# Patient Record
Sex: Male | Born: 1955 | Race: White | Hispanic: No | Smoking: Former smoker
Health system: Southern US, Community
[De-identification: ages and names within clinical notes are randomized; demographics above are authoritative.]

## PROBLEM LIST (undated history)

## (undated) DIAGNOSIS — C801 Malignant (primary) neoplasm, unspecified: Secondary | ICD-10-CM

## (undated) DIAGNOSIS — I10 Essential (primary) hypertension: Secondary | ICD-10-CM

## (undated) DIAGNOSIS — E1140 Type 2 diabetes mellitus with diabetic neuropathy, unspecified: Secondary | ICD-10-CM

## (undated) DIAGNOSIS — Z87442 Personal history of urinary calculi: Secondary | ICD-10-CM

## (undated) DIAGNOSIS — R0602 Shortness of breath: Secondary | ICD-10-CM

## (undated) DIAGNOSIS — I639 Cerebral infarction, unspecified: Secondary | ICD-10-CM

## (undated) DIAGNOSIS — K219 Gastro-esophageal reflux disease without esophagitis: Secondary | ICD-10-CM

## (undated) HISTORY — PX: APPENDECTOMY: SHX54

---

## 2001-10-15 ENCOUNTER — Inpatient Hospital Stay (HOSPITAL_COMMUNITY): Admission: EM | Admit: 2001-10-15 | Discharge: 2001-10-16 | Payer: Self-pay | Admitting: Psychiatry

## 2001-12-08 ENCOUNTER — Emergency Department (HOSPITAL_COMMUNITY): Admission: EM | Admit: 2001-12-08 | Discharge: 2001-12-08 | Payer: Self-pay | Admitting: Emergency Medicine

## 2001-12-08 ENCOUNTER — Encounter: Payer: Self-pay | Admitting: Emergency Medicine

## 2006-10-18 ENCOUNTER — Emergency Department (HOSPITAL_COMMUNITY): Admission: EM | Admit: 2006-10-18 | Discharge: 2006-10-18 | Payer: Self-pay | Admitting: Emergency Medicine

## 2008-02-21 DIAGNOSIS — I639 Cerebral infarction, unspecified: Secondary | ICD-10-CM

## 2008-02-21 HISTORY — DX: Cerebral infarction, unspecified: I63.9
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
## 2010-12-02 LAB — URINALYSIS, ROUTINE W REFLEX MICROSCOPIC
Bilirubin Urine: NEGATIVE
Glucose, UA: >1000 — AB
Hgb urine dipstick: NEGATIVE
Ketones, ur: NEGATIVE
Leukocytes, UA: NEGATIVE
Nitrite: NEGATIVE
Specific Gravity, Urine: 1.025
Urobilinogen, UA: 0.2
pH: 5.5

## 2010-12-02 LAB — BASIC METABOLIC PANEL
BUN: 11
CO2: 26
Calcium: 8.8
Chloride: 104
Creatinine, Ser: 0.88
GFR calc Af Amer: >60
GFR calc non Af Amer: >60
Glucose, Bld: 254 — ABNORMAL HIGH
Potassium: 4.1
Sodium: 137

## 2010-12-02 LAB — DIFFERENTIAL
Basophils Relative: 2 — ABNORMAL HIGH
Eosinophils Absolute: 0.5
Lymphs Abs: 2.5
Neutro Abs: 8.4 — ABNORMAL HIGH
Neutrophils Relative %: 66

## 2010-12-02 LAB — CBC
HCT: 47.8
Hemoglobin: 16.5
MCHC: 34.5
MCV: 90
Platelets: 278
RBC: 5.31
RDW: 13.9
WBC: 12.8 — ABNORMAL HIGH

## 2010-12-02 LAB — URINE MICROSCOPIC-ADD ON

## 2010-12-28 ENCOUNTER — Other Ambulatory Visit: Payer: Self-pay

## 2010-12-28 ENCOUNTER — Inpatient Hospital Stay (HOSPITAL_COMMUNITY)
Admission: EM | Admit: 2010-12-28 | Discharge: 2010-12-31 | DRG: 418 | Disposition: A | Discharge status: Home / Self Care | Payer: Self-pay | Attending: Internal Medicine | Admitting: Internal Medicine

## 2010-12-28 ENCOUNTER — Emergency Department (HOSPITAL_COMMUNITY): Payer: Self-pay

## 2010-12-28 ENCOUNTER — Inpatient Hospital Stay (HOSPITAL_COMMUNITY): Payer: Self-pay

## 2010-12-28 DIAGNOSIS — Z91199 Patient's noncompliance with other medical treatment and regimen due to unspecified reason: Secondary | ICD-10-CM

## 2010-12-28 DIAGNOSIS — R739 Hyperglycemia, unspecified: Secondary | ICD-10-CM

## 2010-12-28 DIAGNOSIS — K810 Acute cholecystitis: Secondary | ICD-10-CM | POA: Diagnosis present

## 2010-12-28 DIAGNOSIS — K8000 Calculus of gallbladder with acute cholecystitis without obstruction: Principal | ICD-10-CM | POA: Diagnosis present

## 2010-12-28 DIAGNOSIS — IMO0001 Reserved for inherently not codable concepts without codable children: Secondary | ICD-10-CM | POA: Diagnosis present

## 2010-12-28 DIAGNOSIS — Z9119 Patient's noncompliance with other medical treatment and regimen: Secondary | ICD-10-CM

## 2010-12-28 DIAGNOSIS — E1165 Type 2 diabetes mellitus with hyperglycemia: Secondary | ICD-10-CM | POA: Diagnosis present

## 2010-12-28 DIAGNOSIS — E871 Hypo-osmolality and hyponatremia: Secondary | ICD-10-CM | POA: Diagnosis present

## 2010-12-28 DIAGNOSIS — IMO0002 Reserved for concepts with insufficient information to code with codable children: Secondary | ICD-10-CM | POA: Diagnosis present

## 2010-12-28 DIAGNOSIS — K819 Cholecystitis, unspecified: Secondary | ICD-10-CM

## 2010-12-28 HISTORY — DX: Cerebral infarction, unspecified: I63.9

## 2010-12-28 HISTORY — DX: Essential (primary) hypertension: I10

## 2010-12-28 HISTORY — DX: Gastro-esophageal reflux disease without esophagitis: K21.9

## 2010-12-28 HISTORY — DX: Shortness of breath: R06.02

## 2010-12-28 LAB — COMPREHENSIVE METABOLIC PANEL
ALT: 31 U/L (ref 0–53)
Alkaline Phosphatase: 102 U/L (ref 39–117)
CO2: 24 mEq/L (ref 19–32)
GFR calc Af Amer: >90 mL/min (ref >90)
GFR calc non Af Amer: >90 mL/min (ref >90)
Glucose, Bld: 353 mg/dL — ABNORMAL HIGH (ref 70–99)
Potassium: 4.2 mEq/L (ref 3.5–5.1)
Sodium: 134 mEq/L — ABNORMAL LOW (ref 135–145)
Total Bilirubin: 1.6 mg/dL — ABNORMAL HIGH (ref 0.3–1.2)

## 2010-12-28 LAB — CBC
Hemoglobin: 16.2 g/dL (ref 13.0–17.0)
MCV: 86.7 fL (ref 78.0–100.0)
Platelets: 254 10*3/uL (ref 150–400)
RBC: 5.33 MIL/uL (ref 4.22–5.81)
WBC: 17.1 10*3/uL — ABNORMAL HIGH (ref 4.0–10.5)

## 2010-12-28 LAB — DIFFERENTIAL
Lymphocytes Relative: 11 % — ABNORMAL LOW (ref 12–46)
Lymphs Abs: 1.8 10*3/uL (ref 0.7–4.0)
Monocytes Relative: 11 % (ref 3–12)
Neutrophils Relative %: 77 % (ref 43–77)

## 2010-12-28 LAB — LIPASE, BLOOD: Lipase: 65 U/L — ABNORMAL HIGH (ref 11–59)

## 2010-12-28 LAB — URINALYSIS, MICROSCOPIC ONLY
Bilirubin Urine: NEGATIVE
Ketones, ur: NEGATIVE mg/dL
Nitrite: NEGATIVE
Protein, ur: 100 mg/dL — AB
Urobilinogen, UA: 0.2 mg/dL (ref 0.0–1.0)

## 2010-12-28 LAB — GLUCOSE, CAPILLARY

## 2010-12-28 MED ORDER — ALUM & MAG HYDROXIDE-SIMETH 200-200-20 MG/5ML PO SUSP: 30.0000 mL | Freq: Four times a day (QID) | ORAL | Status: DC | PRN

## 2010-12-28 MED ORDER — ACETAMINOPHEN 325 MG PO TABS: 650.0000 mg | ORAL_TABLET | Freq: Four times a day (QID) | ORAL | Status: DC | PRN

## 2010-12-28 MED ORDER — PROMETHAZINE HCL 25 MG/ML IJ SOLN: 12.5000 mg | Freq: Four times a day (QID) | INTRAMUSCULAR | Status: DC | PRN

## 2010-12-28 MED ORDER — INSULIN ASPART 100 UNIT/ML ~~LOC~~ SOLN
10.0000 [IU] | Freq: Once | SUBCUTANEOUS | Status: AC
  Administered 2010-12-28: 10 [IU] via INTRAVENOUS

## 2010-12-28 MED ORDER — SENNA 8.6 MG PO TABS: 2.0000 | ORAL_TABLET | Freq: Every day | ORAL | Status: DC | PRN

## 2010-12-28 MED ORDER — HYDROMORPHONE HCL PF 1 MG/ML IJ SOLN
1.0000 mg | INTRAMUSCULAR | Status: DC | PRN
  Administered 2010-12-28 – 2010-12-30 (×8): 2 mg via INTRAVENOUS
  Administered 2010-12-30: 1 mg via INTRAVENOUS
  Administered 2010-12-30: 2 mg via INTRAVENOUS
  Administered 2010-12-30 (×2): 1 mg via INTRAVENOUS
  Filled 2010-12-28 (×8): qty 2
  Filled 2010-12-28: qty 1
  Filled 2010-12-28 (×2): qty 2
  Filled 2010-12-28 (×3): qty 1

## 2010-12-28 MED ORDER — HYDROMORPHONE HCL 1 MG/ML IJ SOLN
1.0000 mg | Freq: Once | INTRAMUSCULAR | Status: AC
  Administered 2010-12-28: 1 mg via INTRAVENOUS

## 2010-12-28 MED ORDER — SODIUM CHLORIDE 0.9 % IV BOLUS (SEPSIS)
1000.0000 mL | Freq: Once | INTRAVENOUS | Status: AC
  Administered 2010-12-28: 1000 mL via INTRAVENOUS

## 2010-12-28 MED ORDER — ACETAMINOPHEN 650 MG RE SUPP: 650.0000 mg | Freq: Four times a day (QID) | RECTAL | Status: DC | PRN

## 2010-12-28 MED ORDER — ONDANSETRON HCL 4 MG/2ML IJ SOLN
4.0000 mg | Freq: Once | INTRAMUSCULAR | Status: AC
  Administered 2010-12-28: 4 mg via INTRAVENOUS
  Filled 2010-12-28: qty 2

## 2010-12-28 MED ORDER — CLINDAMYCIN PHOSPHATE 600 MG/50ML IV SOLN
600.0000 mg | Freq: Four times a day (QID) | INTRAVENOUS | Status: DC
  Administered 2010-12-28 – 2010-12-31 (×12): 600 mg via INTRAVENOUS
  Filled 2010-12-28 (×21): qty 50

## 2010-12-28 MED ORDER — SODIUM CHLORIDE 0.9 % IV SOLN
INTRAVENOUS | Status: DC
  Administered 2010-12-28 – 2010-12-29 (×3): via INTRAVENOUS

## 2010-12-28 MED ORDER — INSULIN ASPART 100 UNIT/ML ~~LOC~~ SOLN
0.0000 [IU] | Freq: Every day | SUBCUTANEOUS | Status: DC
  Administered 2010-12-28: 3 [IU] via SUBCUTANEOUS
  Administered 2010-12-30: 2 [IU] via SUBCUTANEOUS

## 2010-12-28 MED ORDER — PROMETHAZINE HCL 12.5 MG PO TABS: 12.5000 mg | ORAL_TABLET | Freq: Four times a day (QID) | ORAL | Status: DC | PRN

## 2010-12-28 MED ORDER — INFLUENZA VIRUS VACC SPLIT PF IM SUSP
0.5000 mL | INTRAMUSCULAR | Status: AC
  Administered 2010-12-29: 0.5 mL via INTRAMUSCULAR
  Filled 2010-12-28: qty 0.5

## 2010-12-28 MED ORDER — INFLUENZA VAC TYP A&B SURF ANT IM INJ
0.5000 mL | INJECTION | INTRAMUSCULAR | Status: DC
  Filled 2010-12-28: qty 0.5

## 2010-12-28 MED ORDER — ZOLPIDEM TARTRATE 5 MG PO TABS: 10.0000 mg | ORAL_TABLET | Freq: Every evening | ORAL | Status: DC | PRN

## 2010-12-28 MED ORDER — HYDROMORPHONE HCL PF 1 MG/ML IJ SOLN
1.0000 mg | INTRAMUSCULAR | Status: DC | PRN
  Administered 2010-12-28 (×2): 1 mg via INTRAVENOUS
  Filled 2010-12-28 (×2): qty 1

## 2010-12-28 MED ORDER — INSULIN ASPART 100 UNIT/ML ~~LOC~~ SOLN
0.0000 [IU] | Freq: Three times a day (TID) | SUBCUTANEOUS | Status: DC
  Administered 2010-12-28 (×2): 11 [IU] via SUBCUTANEOUS
  Administered 2010-12-29 (×2): 8 [IU] via SUBCUTANEOUS
  Administered 2010-12-29: 5 [IU] via SUBCUTANEOUS
  Administered 2010-12-30: 3 [IU] via SUBCUTANEOUS
  Administered 2010-12-30 – 2010-12-31 (×2): 5 [IU] via SUBCUTANEOUS
  Filled 2010-12-28: qty 3

## 2010-12-28 MED ORDER — SODIUM CHLORIDE 0.9 % IV SOLN
Freq: Once | INTRAVENOUS | Status: AC
  Administered 2010-12-28: 08:00:00 via INTRAVENOUS

## 2010-12-28 MED ORDER — INSULIN GLARGINE 100 UNIT/ML ~~LOC~~ SOLN
5.0000 [IU] | Freq: Every day | SUBCUTANEOUS | Status: DC
  Administered 2010-12-28: 5 [IU] via SUBCUTANEOUS
  Filled 2010-12-28: qty 3

## 2010-12-28 MED ORDER — HYDROMORPHONE HCL 1 MG/ML IJ SOLN: 1.0000 mg | Freq: Once | INTRAMUSCULAR | Status: DC

## 2010-12-28 MED ORDER — HYDROMORPHONE HCL PF 1 MG/ML IJ SOLN
INTRAMUSCULAR | Status: AC
  Filled 2010-12-28: qty 1

## 2010-12-28 NOTE — Consults (Signed)
0930 Spoke with Dr. Lendell Caprice, hospitalist. She has accepted to patient for admission. She asked for med-surg bed and beginning treatment for diabetes. IVF ordered and insulin ordered.

## 2010-12-28 NOTE — Progress Notes (Signed)
Consulted to see patient due to noncompliance with diabetes regimen and diet.  Spoke with patient at length concerning his Diabetes. Patient stated that he knows what to eat, that he needs to take his Metformin and check his sugar, but stated " I just don't have the ambition to do it."  Explained to patient the need to control his diabetes due to complications that could happen due to his noncompliance.    Also gave patient information about free diabetes outpatient diabetes education classes offered at [facility].  Patient stated that he lost his job in April and is currently without insurance.  Also patient does not see is PCP any longer which was Dr. Regino Schultze.    Will continue to follow and dietitian consult also entered for patient. HgbA1C pending.  Inpatient Diabetes Program Recommendations  AACE/ADA: New Consensus Statement on Inpatient Glycemic Control (2009)  Target Ranges:  Prepandial:   less than 140 mg/dL      Peak postprandial:   less than 180 mg/dL (1-2 hours)      Critically ill patients:  140 - 180 mg/dL   Reason for Visit: Consult  Inpatient Diabetes Program Recommendations Insulin - Basal: Increase Lantus to 20 units daily based on weight

## 2010-12-28 NOTE — ED Notes (Signed)
Pt reports pain to his right flank area when he takes a deep breath.  Pt reports chills throughout the night.  Pt reports "it feels like its in my lungs".  nad noted

## 2010-12-28 NOTE — ED Notes (Signed)
Pt c/o flank pain when he takes a deep breath. Pt states he thinks his gallbladder is going bad. Pt states he has been hurting for a few days. Pt states the pain radiates to his chest when he takes a deep breath.

## 2010-12-28 NOTE — ED Provider Notes (Signed)
History   This chart was scribed for [employer]. Colon Branch, MD by Clarita Crane. The patient was seen in room APA05/APA05 and the patient's care was started at 7:45AM.   CSN: [id number] Arrival date & time: 12/28/2010  7:22 AM   First MD Initiated Contact with Patient 12/28/10 0725      Chief Complaint  Patient presents with  . Flank Pain   HPI Grant Yang is a 55 y.o. male who presents to the Emergency Department complaining of constant moderate RUQ abdominal pain described as sharp with sudden onset last night while resting on the couch just after eating and persistent since with no associated symptoms. Reports pain is aggravated by deep breathing, palpation, straining with BM and lateral rotation of torso to the left. Denies recent injury, nasal congestion, nausea, vomiting, abdominal pain. Last BM was last night and normal in consistency and color. Patient is a former smoker (quit 2 years ago).  PCP- None  Past Medical History  Diagnosis Date  . Diabetes mellitus   . Hypertension     Past Surgical History  Procedure Date  . Appendectomy     No family history on file.  History  Substance Use Topics  . Smoking status: Former Games developer  . Smokeless tobacco: Not on file  . Alcohol Use: No      Review of Systems 10 Systems reviewed and are negative for acute change except as noted in the HPI.  Allergies  Penicillins  Home Medications   Current Outpatient Rx  Name Route Sig Dispense Refill  . CALCIUM CARBONATE ANTACID 500 MG PO CHEW Oral Chew 2 tablets by mouth daily. For indigestion     . NAPROXEN SODIUM 220 MG PO TABS Oral Take 220 mg by mouth every 8 (eight) hours as needed. For pain       BP 134/81  Pulse 81  Temp(Src) 97.9 F (36.6 C) (Oral)  Resp 16  Ht 5\' 11"  (1.803 m)  Wt 232 lb (105.235 kg)  BMI 32.36 kg/m2  SpO2 93%  Physical Exam  Nursing note and vitals reviewed. Constitutional: He is oriented to person, place, and time. He appears well-developed  and well-nourished. No distress.  HENT:  Head: Normocephalic and atraumatic.  Eyes: EOM are normal. Pupils are equal, round, and reactive to light.  Neck: Neck supple. No tracheal deviation present.  Cardiovascular: Normal rate and regular rhythm.  Exam reveals no gallop and no friction rub.   No murmur heard. Pulmonary/Chest: Effort normal. No respiratory distress. He exhibits no tenderness.  Abdominal: Soft. Bowel sounds are normal. He exhibits no distension. There is tenderness in the right upper quadrant. There is CVA tenderness.  Musculoskeletal: Normal range of motion. He exhibits no edema.       Entire spine non-tender.   Neurological: He is alert and oriented to person, place, and time. No sensory deficit.  Skin: Skin is warm and dry.  Psychiatric: He has a normal mood and affect. His behavior is normal.    ED Course  Procedures (including critical care time)  DIAGNOSTIC STUDIES: Oxygen Saturation is 96% on room air, normal by my interpretation.    COORDINATION OF CARE: 9:16AM- Consult complete with Dr. Lovell Sheehan. Patient case explained and discussed. Dr. Lovell Sheehan recommends internal medicine admit patient due to uncontrolled diabetes. Will consult with hospitalist. Patient also notes he is still experiencing abdominal pain at this time.  9:29AM- Consult complete with Dr. Lendell Caprice. Patient case explained and discussed. Dr. Lendell Caprice agrees to admit patient  for further evaluation and treatment.   Labs Reviewed  CBC - Abnormal; Notable for the following:    WBC 17.1 (*)    All other components within normal limits  DIFFERENTIAL - Abnormal; Notable for the following:    Neutro Abs 13.2 (*)    Lymphocytes Relative 11 (*)    Monocytes Absolute 1.9 (*)    All other components within normal limits  COMPREHENSIVE METABOLIC PANEL - Abnormal; Notable for the following:    Sodium 134 (*)    Glucose, Bld 353 (*)    Total Bilirubin 1.6 (*)    All other components within normal limits    URINALYSIS, MICROSCOPIC ONLY - Abnormal; Notable for the following:    Glucose, UA >1000 (*)    Hgb urine dipstick TRACE (*)    Protein, ur 100 (*)    All other components within normal limits  LIPASE, BLOOD - Abnormal; Notable for the following:    Lipase 65 (*)    All other components within normal limits  AMYLASE  BILIRUBIN, DIRECT   US Abdomen Limited Ruq  12/28/2010  *RADIOLOGY REPORT*  Clinical Data:  Right upper quadrant abdominal pain  LIMITED ABDOMINAL ULTRASOUND - RIGHT UPPER QUADRANT  Comparison:  None.  Findings:  Gallbladder:  There is a solitary echogenic approximately 1.7 cm gallstone within an otherwise normal-appearing gallbladder.  The gallstone is not definitely mobile.  No gallbladder wall thickening or pericholecystic fluid.  The sonographer reports the patient experienced discomfort with ultrasound examination of the gallbladder.  Common bile duct:  Minimally enlarged, measuring 7.5 mm in diameter.  Liver:  The liver demonstrates diffuse homogeneous increased echotexture suggestive of fatty infiltration.  No discrete hepatic lesions.  No ascites.  IMPRESSION: 1.  Cholelithiasis within an otherwise normal-appearing gallbladder.  As the sonographer reports the patient experienced discomfort with ultrasound examination of the gallbladder, these findings are indeterminate for acute cholecystitis.  Further evaluation may be obtained with a HIDA scan as clinically indicated. 2.  Findings suggestive of hepatic steatosis.                   Original Report Authenticated By: Waynard Reeds, M.D.     1. Cholecystitis   2. Hyperglycemia       MDM  Patient with onset of RUQ and right costal margin pain since last night. Associated with mild nausea, no vomiting. Labs with elevated wbc and ultrasound with evidence of gallstones. Clinically positive for acute cholecystitis. Also with untreated diabetes as patient is medically non compliant. Spoken with both the surgeon and  hospitalist. Patient to be admitted.Pt stable in ED with no significant deterioration in condition.The patient appears reasonably stabilized for admission considering the current resources, flow, and capabilities available in the ED at this time, and I doubt any other [facility] requiring further screening and/or treatment in the ED prior to admission.MDM Reviewed: nursing note and vitals Interpretation: labs and ultrasound Total time providing critical care: 40.  I personally performed the services described in this documentation, which was scribed in my presence. The recorded information has been reviewed and considered.    Nicoletta Dress. Colon Branch, MD 12/28/10 1018

## 2010-12-28 NOTE — H&P (Signed)
Hospital Admission Note Date: 12/28/2010  Patient name: Grant Yang Medical record number: [id number] Date of birth: 1955/07/01 Age: 55 y.o. Gender: male PCP: None  Attending physician: Christiane Ha  Chief Complaint: Back pain and abdominal pain  History of Present Illness:  Grant Yang is an 55 y.o. male who recently moved back here from [place]. He has a history of diabetes but has not taken any medications for several years. He has no primary care physician. He presents with right flank pain and right upper quadrant pain. He's had subjective fevers and chills and night sweats. His symptoms started yesterday. He's had no vomiting no diarrhea. He was found to have cholelithiasis, Murphy's sign, leukocytosis. The ED physician spoke with Dr. Franky Macho regarding concerns of acute cholecystitis. The patient's blood sugar was over 300 so Dr. Lovell Sheehan requested that the hospitalist admit primarily and he will consult. The patient reports that he also has "a touch" of hypertension. He had previously been on medications, but has not taken any for years.  Past Medical History  Diagnosis Date  . Diabetes mellitus   . Hypertension     Meds: Prescriptions prior to admission  Medication Sig Dispense Refill  . calcium carbonate (TUMS - DOSED IN MG ELEMENTAL CALCIUM) 500 MG chewable tablet Chew 2 tablets by mouth daily. For indigestion       . naproxen sodium (ANAPROX) 220 MG tablet Take 220 mg by mouth every 8 (eight) hours as needed. For pain         Allergies: Penicillins History   Social History  Patient is divorced. He is a previous smoker. Previous cocaine and crystal meth user, clean for 4 years. He drinks alcohol occasionally but denies to excess. He recently broke up with his girlfriend and therefore moved back to [place].  Family history: Both parents had diabetes. Mother had COPD. Heart failure. His father had cancer.  Past Surgical History  Procedure  Date  . Appendectomy    Review of Systems: Systems reviewed and as per HPI, otherwise negative.  Physical Exam: Blood pressure 152/91, pulse 93, temperature 97.8 F (36.6 C), temperature source Oral, resp. rate 16, height 5\' 11"  (1.803 m), weight 105.235 kg (232 lb), SpO2 95.00%.  General: Uncomfortable-appearing gentleman lying on his side, winces with any movement in the bed. HEENT pupils equal round reactive to light sclera nonicteric moist mucous membranes Neck supple no lymphadenopathy no thyromegaly Lungs clear to auscultation bilaterally without wheezes rhonchi or rales Cardiovascular regular rate rhythm without murmurs gallops rubs Abdomen soft normal bowel sounds right upper quadrant is tender nondistended GU and rectal exam were deferred Extremities no clubbing cyanosis or edema Neurologic alert and oriented cranial nerves and sensorimotor exam intact Psychiatric: Normal affect Skin no rash Lymphatics no lymphadenopathy  Lab results: Basic Metabolic Panel:  Basename 12/28/10 0808  NA 134*  K 4.2  CL 99  CO2 24  GLUCOSE 353*  BUN 12  CREATININE 0.75  CALCIUM 10.3  MG --  PHOS --   Liver Function Tests:  [facility] 12/28/10 0808  AST 16  ALT 31  ALKPHOS 102  BILITOT 1.6*  PROT 7.8  ALBUMIN 4.3    Basename 12/28/10 0914  LIPASE 65*  AMYLASE 29   No results found for this basename: AMMONIA:2 in the last 72 hours CBC:  Basename 12/28/10 0808  WBC 17.1*  NEUTROABS 13.2*  HGB 16.2  HCT 46.2  MCV 86.7  PLT 254   Cardiac Enzymes: No results  found for this basename: CKTOTAL:3,CKMB:3,CKMBINDEX:3,TROPONINI:3 in the last 72 hours BNP: No results found for this basename: POCBNP:3 in the last 72 hours D-Dimer: No results found for this basename: DDIMER:2 in the last 72 hours CBG:  Basename 12/28/10 1313 12/28/10 1146  GLUCAP 253* 293*   Hemoglobin A1C: No results found for this basename: HGBA1C in the last 72 hours Fasting Lipid Panel: No results  found for this basename: CHOL,HDL,LDLCALC,TRIG,CHOLHDL,LDLDIRECT in the last 72 hours Thyroid Function Tests: No results found for this basename: TSH,T4TOTAL,FREET4,T3FREE,THYROIDAB in the last 72 hours Anemia Panel: No results found for this basename: VITAMINB12,FOLATE,FERRITIN,TIBC,IRON,RETICCTPCT in the last 72 hours Coagulation: No results found for this basename: LABPROT:2,INR:2 in the last 72 hours Alcohol Level: No results found for this basename: ETH:2 in the last 72 hours Urinalysis: YELLOW Appearance CLEAR Specific Gravity, Urine 1.025 pH 5.0 Glucose, UA 1000 mg/dL">1000 Bilirubin Urine NEGATIVE Ketones, ur NEGATIVE Protein, ur 100 Urobilinogen, UA 0.2 Nitrite NEGATIVE Leukocytes, UA NEGATIVE Urine-Other NO FORMED    Imaging results:  US Abdomen Limited Ruq  12/28/2010  *RADIOLOGY REPORT*  Clinical Data:  Right upper quadrant abdominal pain  LIMITED ABDOMINAL ULTRASOUND - RIGHT UPPER QUADRANT  Comparison:  None.  Findings:  Gallbladder:  There is a solitary echogenic approximately 1.7 cm gallstone within an otherwise normal-appearing gallbladder.  The gallstone is not definitely mobile.  No gallbladder wall thickening or pericholecystic fluid.  The sonographer reports the patient experienced discomfort with ultrasound examination of the gallbladder.  Common bile duct:  Minimally enlarged, measuring 7.5 mm in diameter.  Liver:  The liver demonstrates diffuse homogeneous increased echotexture suggestive of fatty infiltration.  No discrete hepatic lesions.  No ascites.  IMPRESSION: 1.  Cholelithiasis within an otherwise normal-appearing gallbladder.  As the sonographer reports the patient experienced discomfort with ultrasound examination of the gallbladder, these findings are indeterminate for acute cholecystitis.  Further evaluation may be obtained with a HIDA scan as clinically indicated. 2.  Findings suggestive of hepatic steatosis.                   Original Report Authenticated By: Waynard Reeds, M.D.    Assessment & Plan: Principal Problem:  *Acute cholecystitis Active Problems:  DM (diabetes mellitus), type 2, uncontrolled  Noncompliance  Patient will be admitted to the floor. Clear liquids for now. Dr. Lovell Sheehan plans to operate in a day or 2. The patient is penicillin allergic and Dr. Lovell Sheehan recommends clindamycin for now. He will get Dilaudid as needed for pain, IV fluids, antiemetics as needed, sequential compression devices.  Check a hemoglobin A1c, give sliding scale insulin for now. He will need diabetic education, but he freely admits that he does not like taking medications and is equivocal when asked whether he would be willing to go back on any of discharge.  Possible history of hypertension: Monitor for now. May need to add an antihypertensive. Shae Augello L 12/28/2010, 1:36 PM

## 2010-12-28 NOTE — Consults (Signed)
5621 Spoke with Dr. Lovell Sheehan, surgeon. He asked that medicine admit for diabetic control. He will consult for surgery. He asked that lipase, amylase and direct bilirubin be added to labs.

## 2010-12-28 NOTE — Consult Note (Signed)
Reason for Consult: Cholecystitis, cholelithiasis Referring Physician: [facility]  Grant Yang is an 55 y.o. male.  HPI: Patient is a 55 year old white male presents with a several-day with history of worsening right upper quadrant abdominal pain, nausea, vomiting. He presented emergency room for further evaluation treatment. A some gallbladder revealed cholelithiasis with a normal common bile duct. No choledocholithiasis was seen. The gallbladder wall was not particularly thickened. He was noted have a leukocytosis with a white blood cell count 17,000. His lipase was slightly elevated. He does have an elevated total bilirubin, though his direct is normal. He was admitted to the hospital for treatment of his nausea and abdominal pain. A surgery consultation is being obtained.  Past Medical History  Diagnosis Date  . Diabetes mellitus   . Hypertension     Past Surgical History  Procedure Date  . Appendectomy     No family history on file.  Social History:  reports that he has quit smoking. He does not have any smokeless tobacco history on file. He reports that he does not drink alcohol or use illicit drugs.  Allergies:  Allergies  Allergen Reactions  . Penicillins Other (See Comments)    Childhood allergy    Medications: I have reviewed the patient's current medications.  Results for orders placed during the hospital encounter of 12/28/10 (from the past 48 hour(s))  CBC     Status: Abnormal   Collection Time   12/28/10  8:08 AM      Component Value Range Comment   WBC 17.1 (*) 4.0 - 10.5 (K/uL)    RBC 5.33  4.22 - 5.81 (MIL/uL)    Hemoglobin 16.2  13.0 - 17.0 (g/dL)    HCT 21.3  08.6 - 57.8 (%)    MCV 86.7  78.0 - 100.0 (fL)    MCH 30.4  26.0 - 34.0 (pg)    MCHC 35.1  30.0 - 36.0 (g/dL)    RDW 46.9  62.9 - 52.8 (%)    Platelets 254  150 - 400 (K/uL)   DIFFERENTIAL     Status: Abnormal   Collection Time   12/28/10  8:08 AM      Component Value Range Comment   Neutrophils Relative 77  43 - 77 (%)    Neutro Abs 13.2 (*) 1.7 - 7.7 (K/uL)    Lymphocytes Relative 11 (*) 12 - 46 (%)    Lymphs Abs 1.8  0.7 - 4.0 (K/uL)    Monocytes Relative 11  3 - 12 (%)    Monocytes Absolute 1.9 (*) 0.1 - 1.0 (K/uL)    Eosinophils Relative 1  0 - 5 (%)    Eosinophils Absolute 0.1  0.0 - 0.7 (K/uL)    Basophils Relative 0  0 - 1 (%)    Basophils Absolute 0.0  0.0 - 0.1 (K/uL)   COMPREHENSIVE METABOLIC PANEL     Status: Abnormal   Collection Time   12/28/10  8:08 AM      Component Value Range Comment   Sodium 134 (*) 135 - 145 (mEq/L)    Potassium 4.2  3.5 - 5.1 (mEq/L)    Chloride 99  96 - 112 (mEq/L)    CO2 24  19 - 32 (mEq/L)    Glucose, Bld 353 (*) 70 - 99 (mg/dL)    BUN 12  6 - 23 (mg/dL)    Creatinine, Ser 4.13  0.50 - 1.35 (mg/dL)    Calcium 24.4  8.4 - 10.5 (mg/dL)  Total Protein 7.8  6.0 - 8.3 (g/dL)    Albumin 4.3  3.5 - 5.2 (g/dL)    AST 16  0 - 37 (U/L)    ALT 31  0 - 53 (U/L)    Alkaline Phosphatase 102  39 - 117 (U/L)    Total Bilirubin 1.6 (*) 0.3 - 1.2 (mg/dL)    GFR calc non Af Amer >90  >90 (mL/min)    GFR calc Af Amer >90  >90 (mL/min)   URINALYSIS, MICROSCOPIC ONLY     Status: Abnormal   Collection Time   12/28/10  8:13 AM      Component Value Range Comment   Color, Urine YELLOW  YELLOW     Appearance CLEAR  CLEAR     Specific Gravity, Urine 1.025  1.005 - 1.030     pH 5.0  5.0 - 8.0     Glucose, UA >1000 (*) NEGATIVE (mg/dL)    Hgb urine dipstick TRACE (*) NEGATIVE     Bilirubin Urine NEGATIVE  NEGATIVE     Ketones, ur NEGATIVE  NEGATIVE (mg/dL)    Protein, ur 161 (*) NEGATIVE (mg/dL)    Urobilinogen, UA 0.2  0.0 - 1.0 (mg/dL)    Nitrite NEGATIVE  NEGATIVE     Leukocytes, UA NEGATIVE  NEGATIVE     Urine-Other        Value: NO FORMED ELEMENTS SEEN ON URINE MICROSCOPIC EXAMINATION  AMYLASE     Status: Normal   Collection Time   12/28/10  9:14 AM      Component Value Range Comment   Amylase 29  0 - 105 (U/L)   LIPASE, BLOOD      Status: Abnormal   Collection Time   12/28/10  9:14 AM      Component Value Range Comment   Lipase 65 (*) 11 - 59 (U/L)   BILIRUBIN, DIRECT     Status: Normal   Collection Time   12/28/10  9:14 AM      Component Value Range Comment   Bilirubin, Direct 0.2  0.0 - 0.3 (mg/dL)   GLUCOSE, CAPILLARY     Status: Abnormal   Collection Time   12/28/10 11:46 AM      Component Value Range Comment   Glucose-Capillary 293 (*) 70 - 99 (mg/dL)    Comment 1 Documented in Chart      Comment 2 Notify RN     GLUCOSE, CAPILLARY     Status: Abnormal   Collection Time   12/28/10  1:13 PM      Component Value Range Comment   Glucose-Capillary 253 (*) 70 - 99 (mg/dL)     US Abdomen Limited Ruq  12/28/2010  *RADIOLOGY REPORT*  Clinical Data:  Right upper quadrant abdominal pain  LIMITED ABDOMINAL ULTRASOUND - RIGHT UPPER QUADRANT  Comparison:  None.  Findings:  Gallbladder:  There is a solitary echogenic approximately 1.7 cm gallstone within an otherwise normal-appearing gallbladder.  The gallstone is not definitely mobile.  No gallbladder wall thickening or pericholecystic fluid.  The sonographer reports the patient experienced discomfort with ultrasound examination of the gallbladder.  Common bile duct:  Minimally enlarged, measuring 7.5 mm in diameter.  Liver:  The liver demonstrates diffuse homogeneous increased echotexture suggestive of fatty infiltration.  No discrete hepatic lesions.  No ascites.  IMPRESSION: 1.  Cholelithiasis within an otherwise normal-appearing gallbladder.  As the sonographer reports the patient experienced discomfort with ultrasound examination of the gallbladder, these findings are indeterminate for acute  cholecystitis.  Further evaluation may be obtained with a HIDA scan as clinically indicated. 2.  Findings suggestive of hepatic steatosis.                   Original Report Authenticated By: Waynard Reeds, M.D.    ROS: See chart Blood pressure 152/91, pulse 93, temperature 97.8 F  (36.6 C), temperature source Oral, resp. rate 16, height 5\' 11"  (1.803 m), weight 105.235 kg (232 lb), SpO2 95.00%. Physical Exam: Well-developed and well-nourished white male in no acute distress.  HEENT examination reveals no scleral icterus. Lungs clear to auscultation with equal breath sounds bilaterally. Heart exam reveals a regular rate and rhythm without S3, S4, murmurs. Abdomen: Soft, tender in right upper quadrant to deep palpation. No hepatosplenomegaly, masses, or hernias.  Assessment/Plan: Impression: Cholecystitis, cholelithiasis Plan: Agree with admission the hospital for further evaluation treatment. I am clindamycin has been ordered. He was subsequently undergo a laparoscopic cholecystectomy. The risks and benefits of the procedure including bleeding, infection, hepatobiliary injury, the possibly of an open procedure were fully explained to the patient, gave informed consent.  Velicia Dejager A 12/28/2010, 2:47 PM

## 2010-12-29 DIAGNOSIS — E871 Hypo-osmolality and hyponatremia: Secondary | ICD-10-CM | POA: Diagnosis present

## 2010-12-29 LAB — CBC
MCH: 30.4 pg (ref 26.0–34.0)
MCHC: 34.2 g/dL (ref 30.0–36.0)
MCV: 89 fL (ref 78.0–100.0)
Platelets: 250 10*3/uL (ref 150–400)
RDW: 12.9 % (ref 11.5–15.5)

## 2010-12-29 LAB — HEPATIC FUNCTION PANEL
ALT: 22 U/L (ref 0–53)
Albumin: 3.4 g/dL — ABNORMAL LOW (ref 3.5–5.2)
Alkaline Phosphatase: 89 U/L (ref 39–117)
Indirect Bilirubin: 1.4 mg/dL — ABNORMAL HIGH (ref 0.3–0.9)
Total Bilirubin: 1.6 mg/dL — ABNORMAL HIGH (ref 0.3–1.2)

## 2010-12-29 LAB — GLUCOSE, CAPILLARY
Glucose-Capillary: 245 mg/dL — ABNORMAL HIGH (ref 70–99)
Glucose-Capillary: 194 mg/dL — ABNORMAL HIGH (ref 70–99)

## 2010-12-29 LAB — BASIC METABOLIC PANEL
Calcium: 9.2 mg/dL (ref 8.4–10.5)
Creatinine, Ser: 0.77 mg/dL (ref 0.50–1.35)
GFR calc Af Amer: >90 mL/min (ref >90)

## 2010-12-29 LAB — HEMOGLOBIN A1C: Hgb A1c MFr Bld: 12.3 % — ABNORMAL HIGH (ref <5.7)

## 2010-12-29 LAB — MAGNESIUM: Magnesium: 1.9 mg/dL (ref 1.5–2.5)

## 2010-12-29 MED ORDER — BISACODYL 10 MG RE SUPP
10.0000 mg | Freq: Once | RECTAL | Status: AC
  Administered 2010-12-29: 10 mg via RECTAL
  Filled 2010-12-29: qty 1

## 2010-12-29 MED ORDER — ENOXAPARIN SODIUM 40 MG/0.4ML ~~LOC~~ SOLN
40.0000 mg | Freq: Once | SUBCUTANEOUS | Status: AC
  Administered 2010-12-29: 40 mg via SUBCUTANEOUS
  Filled 2010-12-29: qty 0.4

## 2010-12-29 MED ORDER — MAGNESIUM HYDROXIDE 400 MG/5ML PO SUSP
30.0000 mL | Freq: Two times a day (BID) | ORAL | Status: DC
  Administered 2010-12-29 – 2010-12-30 (×3): 30 mL via ORAL
  Filled 2010-12-29 (×4): qty 30

## 2010-12-29 MED ORDER — INSULIN GLARGINE 100 UNIT/ML ~~LOC~~ SOLN
15.0000 [IU] | Freq: Every day | SUBCUTANEOUS | Status: DC
  Administered 2010-12-29 – 2010-12-30 (×2): 15 [IU] via SUBCUTANEOUS

## 2010-12-29 NOTE — Progress Notes (Signed)
Subjective: Still with right upper quadrant abdominal pain. Feels constipated.  Objective: Vital signs in last 24 hours: Temp:  [97.3 F (36.3 C)-98.1 F (36.7 C)] 98.1 F (36.7 C) (11/08 9562) Pulse Rate:  [81-100] 94  (11/08 0608) Resp:  [16-24] 20  (11/08 0608) BP: (124-161)/(78-96) 125/78 mmHg (11/08 0608) SpO2:  [90 %-95 %] 93 % (11/08 1308) Weight:  [105.235 kg (232 lb)] 232 lb (105.235 kg) (11/07 1322) Last BM Date: 12/27/10  Intake/Output from previous day: 11/07 0701 - 11/08 0700 In: 2232 [P.O.:480; I.V.:1552; IV Piggyback:200] Out: 1400 [Urine:1400] Intake/Output this shift:    General appearance: alert, cooperative and no distress Resp: clear to auscultation bilaterally Cardio: regular rate and rhythm, S1, S2 normal, no murmur, click, rub or gallop GI: Soft, flat. Mild right quadrant abdominal pain to palpation. No rigidity noted.  Lab Results:   Basename 12/29/10 0455 12/28/10 0808  WBC 21.6* 17.1*  HGB 14.6 16.2  HCT 42.7 46.2  PLT 250 254   BMET  Basename 12/29/10 0455 12/28/10 0808  NA 133* 134*  K 4.5 4.2  CL 97 99  CO2 28 24  GLUCOSE 279* 353*  BUN 8 12  CREATININE 0.77 0.75  CALCIUM 9.2 10.3   PT/INR No results found for this basename: LABPROT:2,INR:2 in the last 72 hours  Studies/Results: Dg Chest Portable 1 View  12/28/2010  *RADIOLOGY REPORT*  Clinical Data: Hypertension, preoperative assessment  PORTABLE CHEST - 1 VIEW  Comparison: Portable exam 1500 hours compared to 10/18/2006  Findings: Upper normal heart size. Normal mediastinal contours and pulmonary vascularity. Minimal right basilar atelectasis. Lungs otherwise clear. No pleural effusion or pneumothorax. Bones unremarkable.  IMPRESSION: Minimal right basilar atelectasis.  Original Report Authenticated By: Lollie Marrow, M.D.   US Abdomen Limited Ruq  12/28/2010  *RADIOLOGY REPORT*  Clinical Data:  Right upper quadrant abdominal pain  LIMITED ABDOMINAL ULTRASOUND - RIGHT UPPER  QUADRANT  Comparison:  None.  Findings:  Gallbladder:  There is Yang solitary echogenic approximately 1.7 cm gallstone within an otherwise normal-appearing gallbladder.  The gallstone is not definitely mobile.  No gallbladder wall thickening or pericholecystic fluid.  The sonographer reports the patient experienced discomfort with ultrasound examination of the gallbladder.  Common bile duct:  Minimally enlarged, measuring 7.5 mm in diameter.  Liver:  The liver demonstrates diffuse homogeneous increased echotexture suggestive of fatty infiltration.  No discrete hepatic lesions.  No ascites.  IMPRESSION: 1.  Cholelithiasis within an otherwise normal-appearing gallbladder.  As the sonographer reports the patient experienced discomfort with ultrasound examination of the gallbladder, these findings are indeterminate for acute cholecystitis.  Further evaluation may be obtained with Yang HIDA scan as clinically indicated. 2.  Findings suggestive of hepatic steatosis.                   Original Report Authenticated By: Waynard Reeds, M.D.    Anti-infectives: Anti-infectives     Start     Dose/Rate Route Frequency Ordered Stop   12/28/10 1415   clindamycin (CLEOCIN) IVPB 600 mg        600 mg 100 mL/hr over 30 Minutes Intravenous 4 times per day 12/28/10 1411            Assessment/Plan: Impression: Cholecystitis, cholelithiasis  Hyperglycemia secondary to poorly controlled diabetes mellitus Plan: We'll plan on doing Yang laparoscopic cholecystectomy tomorrow. The risks and benefits of the procedure including bleeding, infection, hepatobiliary injury, possibly of an open procedure were fully explained to the patient, gave  informed consent.  LOS: 1 day    Grant Yang 12/29/2010

## 2010-12-29 NOTE — Consults (Signed)
Nutrition Education Consult  Pt is noncompliant diabetic with pending surgery. His HgbA1C is 12%. He is alone and c/o pain. Refusing edu at this time because he has just received pain meds and wants to rest. Handouts provided included: Plate Method Meal Planning and Label reading. Will follow-up if needed before d/c if pt has questions. Please re-consult at that time.  #[phone]

## 2010-12-29 NOTE — Progress Notes (Signed)
Subjective: The patient says that he still has right upper quadrant pain, but much less than yesterday. No complaints of nausea or vomiting. He does complain of constipation.  Objective: Vital signs in last 24 hours: Filed Vitals:   12/28/10 1322 12/28/10 2156 12/29/10 0155 12/29/10 0608  BP: 152/91 161/82 143/96 125/78  Pulse: 93 100 93 94  Temp: 97.8 F (36.6 C) 97.3 F (36.3 C) 98.1 F (36.7 C) 98.1 F (36.7 C)  TempSrc: Oral Oral Oral Oral  Resp: 16 24 20 20   Height: 5\' 11"  (1.803 m)     Weight: 105.235 kg (232 lb)     SpO2:  93% 90% 93%    Intake/Output Summary (Last 24 hours) at 12/29/10 1139 Last data filed at 12/29/10 0515  Gross per 24 hour  Intake   2232 ml  Output   1400 ml  Net    832 ml    Weight change:   Exam: Lungs: Clear to auscultation bilaterally. Heart: S1, S2, with no murmurs rubs or gallops. Abdomen: Positive bowel sounds, soft, moderately tender in the epigastrium and right upper quadrant, no masses palpated, no distention. Extremities: No pedal edema.  Lab Results: Basic Metabolic Panel:  Basename 12/29/10 0455 12/28/10 0808  NA 133* 134*  K 4.5 4.2  CL 97 99  CO2 28 24  GLUCOSE 279* 353*  BUN 8 12  CREATININE 0.77 0.75  CALCIUM 9.2 10.3  MG 1.9 --  PHOS 3.3 --   Liver Function Tests:  [facility] 12/29/10 0455 12/28/10 0808  AST 12 16  ALT 22 31  ALKPHOS 89 102  BILITOT 1.6* 1.6*  PROT 7.1 7.8  ALBUMIN 3.4* 4.3    Basename 12/29/10 0455 12/28/10 0914  LIPASE 18 65*  AMYLASE -- 29   No results found for this basename: AMMONIA:2 in the last 72 hours CBC:  Basename 12/29/10 0455 12/28/10 0808  WBC 21.6* 17.1*  NEUTROABS -- 13.2*  HGB 14.6 16.2  HCT 42.7 46.2  MCV 89.0 86.7  PLT 250 254   Cardiac Enzymes: No results found for this basename: CKTOTAL:3,CKMB:3,CKMBINDEX:3,TROPONINI:3 in the last 72 hours BNP: No results found for this basename: POCBNP:3 in the last 72 hours D-Dimer: No results found for this basename:  DDIMER:2 in the last 72 hours CBG:  Basename 12/29/10 0746 12/28/10 2041 12/28/10 1313 12/28/10 1146  GLUCAP 255* 300* 253* 293*   Hemoglobin A1C:  Basename 12/28/10 1257  HGBA1C 12.3*   Fasting Lipid Panel: No results found for this basename: CHOL,HDL,LDLCALC,TRIG,CHOLHDL,LDLDIRECT in the last 72 hours Thyroid Function Tests: No results found for this basename: TSH,T4TOTAL,FREET4,T3FREE,THYROIDAB in the last 72 hours Anemia Panel: No results found for this basename: VITAMINB12,FOLATE,FERRITIN,TIBC,IRON,RETICCTPCT in the last 72 hours Coagulation: No results found for this basename: LABPROT:2,INR:2 in the last 72 hours Urine Drug Screen:  Alcohol Level: No results found for this basename: ETH:2 in the last 72 hours Urinalysis:  Misc. Labs:  Micro: No results found for this or any previous visit (from the past 240 hour(s)).  Studies/Results: Dg Chest Portable 1 View  12/28/2010  *RADIOLOGY REPORT*  Clinical Data: Hypertension, preoperative assessment  PORTABLE CHEST - 1 VIEW  Comparison: Portable exam 1500 hours compared to 10/18/2006  Findings: Upper normal heart size. Normal mediastinal contours and pulmonary vascularity. Minimal right basilar atelectasis. Lungs otherwise clear. No pleural effusion or pneumothorax. Bones unremarkable.  IMPRESSION: Minimal right basilar atelectasis.  Original Report Authenticated By: Lollie Marrow, M.D.   US Abdomen Limited Ruq  12/28/2010  *RADIOLOGY  REPORT*  Clinical Data:  Right upper quadrant abdominal pain  LIMITED ABDOMINAL ULTRASOUND - RIGHT UPPER QUADRANT  Comparison:  None.  Findings:  Gallbladder:  There is a solitary echogenic approximately 1.7 cm gallstone within an otherwise normal-appearing gallbladder.  The gallstone is not definitely mobile.  No gallbladder wall thickening or pericholecystic fluid.  The sonographer reports the patient experienced discomfort with ultrasound examination of the gallbladder.  Common bile duct:   Minimally enlarged, measuring 7.5 mm in diameter.  Liver:  The liver demonstrates diffuse homogeneous increased echotexture suggestive of fatty infiltration.  No discrete hepatic lesions.  No ascites.  IMPRESSION: 1.  Cholelithiasis within an otherwise normal-appearing gallbladder.  As the sonographer reports the patient experienced discomfort with ultrasound examination of the gallbladder, these findings are indeterminate for acute cholecystitis.  Further evaluation may be obtained with a HIDA scan as clinically indicated. 2.  Findings suggestive of hepatic steatosis.                   Original Report Authenticated By: Waynard Reeds, M.D.    Medications: I have reviewed the patient's current medications.  Assessment: Principal Problem:  *Acute cholecystitis Active Problems:  DM (diabetes mellitus), type 2, uncontrolled  Noncompliance  Hyponatremia  1: Acute cholecystitis. He is on clindamycin empirically. His white blood cell count did increase. We'll consider adding Cipro if his white count continues to increase. Cholecystectomy is planned tomorrow by Dr. Lovell Sheehan.  Type 2 diabetes mellitus, uncontrolled secondary to noncompliance. His hemoglobin A1c is greater than 12. He is currently being treated with Lantus and sliding scale NovoLog. However increased the dose of his Lantus given that his capillary blood glucose has been consistently above 200. He will need a higher sliding scale NovoLog scale, however, due to the pending operation tomorrow and clear liquid diet, we will not increase it yet.  Hyponatremia. This is being treated with normal saline.   Plan:  We'll increase the dose of Lantus. Otherwise, continue current management. Will consider restarting metformin and/or another oral diabetes medication following the operation tomorrow and if the patient agrees.   LOS: 1 day   Zelma Snead 12/29/2010, 11:39 AM

## 2010-12-30 ENCOUNTER — Encounter (HOSPITAL_COMMUNITY): Payer: Self-pay | Admitting: Anesthesiology

## 2010-12-30 ENCOUNTER — Inpatient Hospital Stay (HOSPITAL_COMMUNITY): Payer: Self-pay | Admitting: Anesthesiology

## 2010-12-30 ENCOUNTER — Other Ambulatory Visit: Payer: Self-pay | Admitting: General Surgery

## 2010-12-30 ENCOUNTER — Encounter (HOSPITAL_COMMUNITY): Payer: Self-pay

## 2010-12-30 ENCOUNTER — Encounter (HOSPITAL_COMMUNITY)
Admission: EM | Disposition: A | Discharge status: Home / Self Care | Payer: Self-pay | Source: Home / Self Care | Attending: Internal Medicine

## 2010-12-30 HISTORY — PX: CHOLECYSTECTOMY: SHX55

## 2010-12-30 LAB — BASIC METABOLIC PANEL
BUN: 10 mg/dL (ref 6–23)
CO2: 28 mEq/L (ref 19–32)
Calcium: 9.5 mg/dL (ref 8.4–10.5)
Chloride: 98 mEq/L (ref 96–112)
Creatinine, Ser: 0.73 mg/dL (ref 0.50–1.35)

## 2010-12-30 LAB — GLUCOSE, CAPILLARY
Glucose-Capillary: 320 mg/dL — ABNORMAL HIGH (ref 70–99)
Glucose-Capillary: 207 mg/dL — ABNORMAL HIGH (ref 70–99)
Glucose-Capillary: 155 mg/dL — ABNORMAL HIGH (ref 70–99)
Glucose-Capillary: 185 mg/dL — ABNORMAL HIGH (ref 70–99)
Glucose-Capillary: 177 mg/dL — ABNORMAL HIGH (ref 70–99)
Glucose-Capillary: 211 mg/dL — ABNORMAL HIGH (ref 70–99)

## 2010-12-30 LAB — CBC
HCT: 41.7 % (ref 39.0–52.0)
MCH: 29.3 pg (ref 26.0–34.0)
MCV: 89.9 fL (ref 78.0–100.0)
Platelets: 240 10*3/uL (ref 150–400)
RBC: 4.64 MIL/uL (ref 4.22–5.81)
WBC: 14.3 10*3/uL — ABNORMAL HIGH (ref 4.0–10.5)

## 2010-12-30 LAB — DIFFERENTIAL
Eosinophils Absolute: 0.1 10*3/uL (ref 0.0–0.7)
Eosinophils Relative: 1 % (ref 0–5)
Lymphocytes Relative: 11 % — ABNORMAL LOW (ref 12–46)
Lymphs Abs: 1.6 10*3/uL (ref 0.7–4.0)
Monocytes Relative: 14 % — ABNORMAL HIGH (ref 3–12)
Neutrophils Relative %: 75 % (ref 43–77)

## 2010-12-30 SURGERY — LAPAROSCOPIC CHOLECYSTECTOMY
Anesthesia: General | Site: Abdomen | Wound class: Contaminated

## 2010-12-30 MED ORDER — GLYCOPYRROLATE 0.2 MG/ML IJ SOLN
INTRAMUSCULAR | Status: AC
  Administered 2010-12-30: 0.2 mg via INTRAVENOUS
  Filled 2010-12-30: qty 1

## 2010-12-30 MED ORDER — ROCURONIUM BROMIDE 100 MG/10ML IV SOLN
INTRAVENOUS | Status: DC | PRN
  Administered 2010-12-30: 30 mg via INTRAVENOUS

## 2010-12-30 MED ORDER — PANTOPRAZOLE SODIUM 40 MG PO TBEC
40.0000 mg | DELAYED_RELEASE_TABLET | Freq: Every day | ORAL | Status: DC
  Administered 2010-12-30: 40 mg via ORAL
  Filled 2010-12-30: qty 1

## 2010-12-30 MED ORDER — GLYCOPYRROLATE 0.2 MG/ML IJ SOLN
0.2000 mg | Freq: Once | INTRAMUSCULAR | Status: AC | PRN
  Administered 2010-12-30: 0.2 mg via INTRAVENOUS

## 2010-12-30 MED ORDER — PROPOFOL 10 MG/ML IV EMUL
INTRAVENOUS | Status: DC | PRN
  Administered 2010-12-30: 170 mg via INTRAVENOUS

## 2010-12-30 MED ORDER — ACETAMINOPHEN 325 MG PO TABS: 325.0000 mg | ORAL_TABLET | ORAL | Status: DC | PRN

## 2010-12-30 MED ORDER — LABETALOL HCL 5 MG/ML IV SOLN
INTRAVENOUS | Status: AC
  Filled 2010-12-30: qty 4

## 2010-12-30 MED ORDER — ONDANSETRON HCL 4 MG/2ML IJ SOLN: 4.0000 mg | Freq: Four times a day (QID) | INTRAMUSCULAR | Status: DC | PRN

## 2010-12-30 MED ORDER — ONDANSETRON HCL 4 MG/2ML IJ SOLN: 4.0000 mg | Freq: Once | INTRAMUSCULAR | Status: DC | PRN

## 2010-12-30 MED ORDER — ONDANSETRON HCL 4 MG/2ML IJ SOLN
INTRAMUSCULAR | Status: AC
  Filled 2010-12-30: qty 2

## 2010-12-30 MED ORDER — FENTANYL CITRATE 0.05 MG/ML IJ SOLN
INTRAMUSCULAR | Status: DC | PRN
  Administered 2010-12-30 (×2): 50 ug via INTRAVENOUS
  Administered 2010-12-30: 150 ug via INTRAVENOUS

## 2010-12-30 MED ORDER — FENTANYL CITRATE 0.05 MG/ML IJ SOLN
INTRAMUSCULAR | Status: AC
  Filled 2010-12-30: qty 5

## 2010-12-30 MED ORDER — MIDAZOLAM HCL 2 MG/2ML IJ SOLN
INTRAMUSCULAR | Status: AC
  Administered 2010-12-30: 2 mg
  Filled 2010-12-30: qty 2

## 2010-12-30 MED ORDER — GLYCOPYRROLATE 0.2 MG/ML IJ SOLN
INTRAMUSCULAR | Status: DC | PRN
  Administered 2010-12-30: .4 mg via INTRAVENOUS

## 2010-12-30 MED ORDER — SODIUM CHLORIDE 0.9 % IV SOLN
INTRAVENOUS | Status: DC
  Administered 2010-12-30: 17:00:00 via INTRAVENOUS

## 2010-12-30 MED ORDER — SODIUM CHLORIDE 0.9 % IR SOLN
Status: DC | PRN
  Administered 2010-12-30: 1000 mL

## 2010-12-30 MED ORDER — SUCCINYLCHOLINE CHLORIDE 20 MG/ML IJ SOLN
INTRAMUSCULAR | Status: DC | PRN
  Administered 2010-12-30: 170 mg via INTRAVENOUS

## 2010-12-30 MED ORDER — ACETAMINOPHEN 10 MG/ML IV SOLN
1000.0000 mg | Freq: Four times a day (QID) | INTRAVENOUS | Status: DC
  Administered 2010-12-30 – 2010-12-31 (×3): 1000 mg via INTRAVENOUS
  Filled 2010-12-30 (×3): qty 100

## 2010-12-30 MED ORDER — SUCCINYLCHOLINE CHLORIDE 20 MG/ML IJ SOLN
INTRAMUSCULAR | Status: AC
  Filled 2010-12-30: qty 1

## 2010-12-30 MED ORDER — GLYCOPYRROLATE 0.2 MG/ML IJ SOLN
INTRAMUSCULAR | Status: AC
  Filled 2010-12-30: qty 1

## 2010-12-30 MED ORDER — LACTATED RINGERS IV SOLN
INTRAVENOUS | Status: DC
  Administered 2010-12-30: 1000 mL via INTRAVENOUS
  Administered 2010-12-30: 15:00:00 via INTRAVENOUS

## 2010-12-30 MED ORDER — LIDOCAINE HCL 1 % IJ SOLN
INTRAMUSCULAR | Status: DC | PRN
  Administered 2010-12-30: 40 mg via INTRADERMAL

## 2010-12-30 MED ORDER — FENTANYL CITRATE 0.05 MG/ML IJ SOLN: 25.0000 ug | INTRAMUSCULAR | Status: DC | PRN

## 2010-12-30 MED ORDER — ACETAMINOPHEN 10 MG/ML IV SOLN
INTRAVENOUS | Status: AC
  Filled 2010-12-30: qty 100

## 2010-12-30 MED ORDER — LABETALOL HCL 5 MG/ML IV SOLN
INTRAVENOUS | Status: DC | PRN
  Administered 2010-12-30: 10 mg via INTRAVENOUS

## 2010-12-30 MED ORDER — PROPOFOL 10 MG/ML IV EMUL
INTRAVENOUS | Status: AC
  Filled 2010-12-30: qty 20

## 2010-12-30 MED ORDER — ENOXAPARIN SODIUM 40 MG/0.4ML ~~LOC~~ SOLN
40.0000 mg | SUBCUTANEOUS | Status: DC
  Administered 2010-12-30: 40 mg via SUBCUTANEOUS
  Filled 2010-12-30: qty 0.4

## 2010-12-30 MED ORDER — BUPIVACAINE HCL 0.5 % IJ SOLN
INTRAMUSCULAR | Status: DC | PRN
  Administered 2010-12-30: 10 mL

## 2010-12-30 MED ORDER — HEMOSTATIC AGENTS (NO CHARGE) OPTIME
TOPICAL | Status: DC | PRN
  Administered 2010-12-30: 1 via TOPICAL

## 2010-12-30 MED ORDER — LIDOCAINE HCL (PF) 1 % IJ SOLN
INTRAMUSCULAR | Status: AC
  Filled 2010-12-30: qty 5

## 2010-12-30 MED ORDER — ENOXAPARIN SODIUM 40 MG/0.4ML ~~LOC~~ SOLN
SUBCUTANEOUS | Status: AC
  Administered 2010-12-30: 40 mg via SUBCUTANEOUS
  Filled 2010-12-30: qty 0.4

## 2010-12-30 MED ORDER — NEOSTIGMINE METHYLSULFATE 1 MG/ML IJ SOLN
INTRAMUSCULAR | Status: DC | PRN
  Administered 2010-12-30: 3 mg via INTRAVENOUS

## 2010-12-30 MED ORDER — ACETAMINOPHEN 10 MG/ML IV SOLN
INTRAVENOUS | Status: AC
  Administered 2010-12-30: 1000 mg via INTRAVENOUS
  Filled 2010-12-30: qty 100

## 2010-12-30 MED ORDER — ONDANSETRON HCL 4 MG PO TABS
4.0000 mg | ORAL_TABLET | Freq: Four times a day (QID) | ORAL | Status: DC | PRN
  Filled 2010-12-30: qty 1

## 2010-12-30 MED ORDER — ENOXAPARIN SODIUM 40 MG/0.4ML ~~LOC~~ SOLN
40.0000 mg | Freq: Once | SUBCUTANEOUS | Status: AC
  Administered 2010-12-30: 40 mg via SUBCUTANEOUS

## 2010-12-30 SURGICAL SUPPLY — 32 items
APPLIER CLIP ROT 10 11.4 M/L (STAPLE)
BAG HAMPER (MISCELLANEOUS) ×2 IMPLANT
CLIP APPLIE ROT 10 11.4 M/L (STAPLE) IMPLANT
CLOTH BEACON ORANGE TIMEOUT ST (SAFETY) ×2 IMPLANT
COVER LIGHT HANDLE STERIS (MISCELLANEOUS) ×4 IMPLANT
DECANTER SPIKE VIAL GLASS SM (MISCELLANEOUS) ×2 IMPLANT
DURAPREP 26ML APPLICATOR (WOUND CARE) ×2 IMPLANT
ELECT REM PT RETURN 9FT ADLT (ELECTROSURGICAL) ×2
ELECTRODE REM PT RTRN 9FT ADLT (ELECTROSURGICAL) ×1 IMPLANT
FILTER SMOKE EVAC LAPAROSHD (FILTER) ×2 IMPLANT
FORMALIN 10 PREFIL 120ML (MISCELLANEOUS) ×2 IMPLANT
GLOVE BIO SURGEON STRL SZ7.5 (GLOVE) ×2 IMPLANT
GLOVE ECLIPSE 6.5 STRL STRAW (GLOVE) ×2 IMPLANT
GLOVE ECLIPSE 7.0 STRL STRAW (GLOVE) ×2 IMPLANT
GLOVE INDICATOR 7.0 STRL GRN (GLOVE) ×4 IMPLANT
GOWN STRL REIN XL XLG (GOWN DISPOSABLE) ×6 IMPLANT
HEMOSTAT SNOW SURGICEL 2X4 (HEMOSTASIS) ×2 IMPLANT
INST SET LAPROSCOPIC AP (KITS) ×2 IMPLANT
KIT ROOM TURNOVER APOR (KITS) ×2 IMPLANT
KIT TROCAR LAP CHOLE (TROCAR) ×2 IMPLANT
MANIFOLD NEPTUNE II (INSTRUMENTS) ×2 IMPLANT
NS IRRIG 1000ML POUR BTL (IV SOLUTION) ×2 IMPLANT
PACK LAP CHOLE LZT030E (CUSTOM PROCEDURE TRAY) ×2 IMPLANT
PAD ARMBOARD 7.5X6 YLW CONV (MISCELLANEOUS) ×2 IMPLANT
POUCH SPECIMEN RETRIEVAL 10MM (ENDOMECHANICALS) ×2 IMPLANT
SET BASIN LINEN APH (SET/KITS/TRAYS/PACK) ×2 IMPLANT
SPONGE GAUZE 2X2 8PLY STRL LF (GAUZE/BANDAGES/DRESSINGS) ×4 IMPLANT
STAPLER VISISTAT (STAPLE) ×2 IMPLANT
SUT VICRYL 0 UR6 27IN ABS (SUTURE) ×2 IMPLANT
TAPE CLOTH SURG 4X10 WHT LF (GAUZE/BANDAGES/DRESSINGS) ×4 IMPLANT
WARMER LAPAROSCOPE (MISCELLANEOUS) ×2 IMPLANT
YANKAUER SUCT 12FT TUBE ARGYLE (SUCTIONS) ×2 IMPLANT

## 2010-12-30 NOTE — Anesthesia Preprocedure Evaluation (Addendum)
Anesthesia Evaluation  Patient identified by MRN, date of birth, ID band Patient awake    Reviewed: Allergy & Precautions, H&P , NPO status , Patient's Chart, lab work & pertinent test results  History of Anesthesia Complications Negative for: history of anesthetic complications  Airway Mallampati: I TM Distance: >3 FB Neck ROM: Full    Dental No notable dental hx.    Pulmonary shortness of breath, former smoker   Pulmonary exam normal       Cardiovascular hypertension, Regular Normal    Neuro/Psych CVA, No Residual Symptoms Negative Psych ROS   GI/Hepatic GERD-  ,  Endo/Other  Diabetes mellitus-, Poorly Controlled, Type 2, Insulin Dependent  Renal/GU      Musculoskeletal negative musculoskeletal ROS (+)   Abdominal Normal abdominal exam  (+)   Peds  Hematology negative hematology ROS (+)   Anesthesia Other Findings   Reproductive/Obstetrics                          Anesthesia Physical Anesthesia Plan  ASA: III  Anesthesia Plan: General   Post-op Pain Management:    Induction: Intravenous, Rapid sequence and Cricoid pressure planned  Airway Management Planned: Oral ETT  Additional Equipment:   Intra-op Plan:   Post-operative Plan: Extubation in OR  Informed Consent: I have reviewed the patients History and Physical, chart, labs and discussed the procedure including the risks, benefits and alternatives for the proposed anesthesia with the patient or authorized representative who has indicated his/her understanding and acceptance.     Plan Discussed with: CRNA  Anesthesia Plan Comments:         Anesthesia Quick Evaluation

## 2010-12-30 NOTE — Anesthesia Postprocedure Evaluation (Addendum)
  Anesthesia Post-op Note  Patient: Grant Yang  Procedure(s) Performed:  LAPAROSCOPIC CHOLECYSTECTOMY  Patient Location: PACU  Anesthesia Type: General  Level of Consciousness: awake, alert , oriented and patient cooperative  Airway and Oxygen Therapy: Patient Spontanous Breathing  Post-op Pain: 3 /10, mild  Post-op Assessment: Post-op Vital signs reviewed, Patient's Cardiovascular Status Stable, Respiratory Function Stable, Patent Airway and No signs of Nausea or vomiting  Post-op Vital Signs: Reviewed and stable  Complications: No apparent anesthesia complications 12/31/10 follow up, Patient discharged today.

## 2010-12-30 NOTE — Progress Notes (Signed)
Subjective: The patient is being seen shortly before the operation. His family is in the room. He has no new complaints except for a nonproductive cough.  Objective: Vital signs in last 24 hours: Filed Vitals:   12/30/10 0210 12/30/10 0628 12/30/10 1301 12/30/10 1315  BP: 137/93 135/86 141/94 130/87  Pulse: 96 99 95   Temp: 99.3 F (37.4 C) 98.5 F (36.9 C) 98.5 F (36.9 C)   TempSrc: Oral Oral Oral   Resp: 20 20 22 21   Height:      Weight:      SpO2: 93% 93% 94%     Intake/Output Summary (Last 24 hours) at 12/30/10 1342 Last data filed at 12/30/10 0800  Gross per 24 hour  Intake 3067.33 ml  Output   1425 ml  Net 1642.33 ml    Weight change:   Exam: Lungs: Clear to auscultation bilaterally, no audible wheezes or crackles. Heart: S1, S2, with no murmurs rubs or gallops. Abdomen: Positive bowel sounds, soft, moderately tender in the epigastrium and right upper quadrant, no masses palpated, no distention. Extremities: No pedal edema.  Lab Results: Basic Metabolic Panel:  Basename 12/30/10 0449 12/29/10 0455  NA 134* 133*  K 4.5 4.5  CL 98 97  CO2 28 28  GLUCOSE 216* 279*  BUN 10 8  CREATININE 0.73 0.77  CALCIUM 9.5 9.2  MG -- 1.9  PHOS -- 3.3   Liver Function Tests:  Basename 12/29/10 0455 12/28/10 0808  AST 12 16  ALT 22 31  ALKPHOS 89 102  BILITOT 1.6* 1.6*  PROT 7.1 7.8  ALBUMIN 3.4* 4.3    Basename 12/29/10 0455 12/28/10 0914  LIPASE 18 65*  AMYLASE -- 29   No results found for this basename: AMMONIA:2 in the last 72 hours CBC:  Basename 12/30/10 0449 12/29/10 0455 12/28/10 0808  WBC 14.3* 21.6* --  NEUTROABS 11.0* -- 13.2*  HGB 13.6 14.6 --  HCT 41.7 42.7 --  MCV 89.9 89.0 --  PLT 240 250 --   Cardiac Enzymes: No results found for this basename: CKTOTAL:3,CKMB:3,CKMBINDEX:3,TROPONINI:3 in the last 72 hours BNP: No results found for this basename: POCBNP:3 in the last 72 hours D-Dimer: No results found for this basename: DDIMER:2 in  the last 72 hours CBG:  Basename 12/30/10 1312 12/30/10 1114 12/30/10 0732 12/29/10 2042 12/29/10 1706 12/29/10 0746  GLUCAP 180* 155* 207* 194* 245* 255*   Hemoglobin A1C:  Basename 12/28/10 1257  HGBA1C 12.3*   Fasting Lipid Panel: No results found for this basename: CHOL,HDL,LDLCALC,TRIG,CHOLHDL,LDLDIRECT in the last 72 hours Thyroid Function Tests: No results found for this basename: TSH,T4TOTAL,FREET4,T3FREE,THYROIDAB in the last 72 hours Anemia Panel: No results found for this basename: VITAMINB12,FOLATE,FERRITIN,TIBC,IRON,RETICCTPCT in the last 72 hours Coagulation: No results found for this basename: LABPROT:2,INR:2 in the last 72 hours Urine Drug Screen:  Alcohol Level: No results found for this basename: ETH:2 in the last 72 hours Urinalysis:  Misc. Labs:  Micro: Recent Results (from the past 240 hour(s))  SURGICAL PCR SCREEN     Status: Normal   Collection Time   12/29/10  5:54 PM      Component Value Range Status Comment   MRSA, PCR NEGATIVE  NEGATIVE  Final    Staphylococcus aureus NEGATIVE  NEGATIVE  Final     Studies/Results: Dg Chest Portable 1 View  12/28/2010  *RADIOLOGY REPORT*  Clinical Data: Hypertension, preoperative assessment  PORTABLE CHEST - 1 VIEW  Comparison: Portable exam 1500 hours compared to 10/18/2006  Findings: Upper  normal heart size. Normal mediastinal contours and pulmonary vascularity. Minimal right basilar atelectasis. Lungs otherwise clear. No pleural effusion or pneumothorax. Bones unremarkable.  IMPRESSION: Minimal right basilar atelectasis.  Original Report Authenticated By: Lollie Marrow, M.D.    Medications: I have reviewed the patient's current medications.  Assessment: Principal Problem:  *Acute cholecystitis Active Problems:  DM (diabetes mellitus), type 2, uncontrolled  Noncompliance  Hyponatremia  1: Acute cholecystitis. He is on clindamycin empirically. Cholecystectomy is pending today  by Dr. Lovell Sheehan. His white  blood cell count has decreased.  Mild hyperbilirubinemia.  Type 2 diabetes mellitus, uncontrolled secondary to noncompliance. His hemoglobin A1c is greater than 12. He is currently being treated with Lantus and sliding scale NovoLog. Will adjust accordingly when he is eating solid foods.  Hyponatremia. Slightly improved. This is being treated with normal saline.   Plan: Laparoscopic cholecystectomy today. Continue supportive treatment and management..   LOS: 2 days   Aaminah Forrester 12/30/2010, 1:42 PM

## 2010-12-30 NOTE — Op Note (Signed)
Patient:  Grant Yang  DOB:  01-08-1956  MRN:  [id number]   Preop Diagnosis:  Cholecystitis, cholelithiasis  Postop Diagnosis:  Same  Procedure:  Laparoscopic cholecystectomy  Surgeon:  Franky Macho, M.D.  Anes:  General endotracheal  Indications:  Patient is a 55 year old white male with uncontrolled diabetes mellitus who presented emergency room with worsening upper quadrant abdominal pain and leukocytosis. He was found to have acute cholecystitis secondary to cholelithiasis. Patient comes the operative room for lap scopic cholecystectomy. The risks and benefits of the procedure including bleeding, infection, hepatobiliary injury, the possibly of an open procedure were fully expanded the patient, gave informed consent.  Procedure note:  Patient is placed the supine position. After induction of general endotracheal anesthesia, the abdomen was prepped and draped using the usual sterile technique with DuraPrep. Surgical site confirmation was performed.  Supraumbilical incision was made down to the fascia. A Veress needle was introduced into the abdominal cavity and confirmation of placement was done using the saline drop test. The abdomen was then insufflated to 16 mm mercury pressure. An 11 mm trocar was introduced into the abdominal cavity under direct visualization without difficulty. Patient is placed in reverse Trendelenburg position additional 11 mm trocar was placed the epigastric region and 5 mm trochars placed were upper quadrant right flank regions. Liver was inspected and noted to be somewhat enlarged, within normal limits. The gallbladder had some omentum attached to it. This was freed away using cautery. The gallbladder was then retracted superior laterally. Dissection was begun around the infundibulum of the gallbladder. The cystic duct was first identified. Junction to the infundibulum flow identified. Endoclips placed proximally distally on the cystic duct and cystic duct was  divided. This is likewise done cystic artery. The gallbladder is then freed away from the gallbladder fossa using Bovie electrocautery. The gallbladder delivered through the epigastric or site using an Endo Catch bag. The gallbladder fossa was inspected no abnormal bleeding or bile leakage was noted. Surgicel is placed the gallbladder fossa. All fluid and air were then evacuated from the abdominal cavity prior to removal of the trochars.  All wounds were gave normal saline. All wounds were injected with 0.5 sent Sensorcaine. The suprabuccal fashion as reapproximated using 0 Vicryl interrupted suture. All skin incisions were closed using staples. Betadine ointment after dressings were applied.  All tape and needle counts were correct at the end of the procedure. The patient was extubated in the operating room went back to recovery room awake in stable condition.  Complications:  None  EBL:  Minimal  Specimen:  Gallbladder

## 2010-12-30 NOTE — Transfer of Care (Signed)
Immediate Anesthesia Transfer of Care Note  Patient: Grant Yang  Procedure(s) Performed:  LAPAROSCOPIC CHOLECYSTECTOMY  Patient Location: PACU  Anesthesia Type: General  Level of Consciousness: awake, alert , oriented and patient cooperative  Airway & Oxygen Therapy: Patient Spontanous Breathing and Patient connected to face mask oxygen  Post-op Assessment: Report given to PACU RN, Post -op Vital signs reviewed and stable, Patient moving all extremities and Patient moving all extremities X 4  Post vital signs: Reviewed and stable  Complications: No apparent anesthesia complications

## 2010-12-30 NOTE — Anesthesia Procedure Notes (Signed)
Procedure Name: Intubation Date/Time: 12/30/2010 2:38 PM Performed by: Despina Hidden Pre-anesthesia Checklist: Patient identified, Patient being monitored, Timeout performed, Emergency Drugs available and Suction available Patient Re-evaluated:Patient Re-evaluated prior to inductionOxygen Delivery Method: Circle System Utilized Preoxygenation: Pre-oxygenation with 100% oxygen Intubation Type: IV induction, Circoid Pressure applied and Rapid sequence Ventilation: Mask ventilation with difficulty Laryngoscope Size: Mac and 3 Grade View: Grade II Tube type: Oral Tube size: 8.0 mm Number of attempts: 1 Airway Equipment and Method: patient positioned with wedge pillow and stylet Placement Confirmation: ETT inserted through vocal cords under direct vision,  positive ETCO2 and breath sounds checked- equal and bilateral Secured at: 22 cm Tube secured with: Tape Dental Injury: Teeth and Oropharynx as per pre-operative assessment

## 2010-12-31 LAB — HEPATIC FUNCTION PANEL
Alkaline Phosphatase: 133 U/L — ABNORMAL HIGH (ref 39–117)
Bilirubin, Direct: 0.3 mg/dL (ref 0.0–0.3)
Indirect Bilirubin: 0.9 mg/dL (ref 0.3–0.9)
Total Protein: 6.3 g/dL (ref 6.0–8.3)

## 2010-12-31 LAB — BASIC METABOLIC PANEL
BUN: 10 mg/dL (ref 6–23)
Calcium: 9 mg/dL (ref 8.4–10.5)
GFR calc non Af Amer: >90 mL/min (ref >90)
Glucose, Bld: 210 mg/dL — ABNORMAL HIGH (ref 70–99)
Potassium: 3.9 mEq/L (ref 3.5–5.1)

## 2010-12-31 LAB — GLUCOSE, CAPILLARY: Glucose-Capillary: 201 mg/dL — ABNORMAL HIGH (ref 70–99)

## 2010-12-31 LAB — CBC
Hemoglobin: 12.4 g/dL — ABNORMAL LOW (ref 13.0–17.0)
MCH: 30.1 pg (ref 26.0–34.0)
MCHC: 33.2 g/dL (ref 30.0–36.0)
MCV: 90.5 fL (ref 78.0–100.0)

## 2010-12-31 MED ORDER — METFORMIN HCL 500 MG PO TABS: 500.0000 mg | ORAL_TABLET | Freq: Two times a day (BID) | ORAL | Status: DC

## 2010-12-31 MED ORDER — OXYCODONE-ACETAMINOPHEN 7.5-325 MG PO TABS: 1.0000 | ORAL_TABLET | ORAL | Status: AC | PRN

## 2010-12-31 NOTE — Progress Notes (Signed)
Pt discharged home via family; Pt and family given and explained all discharge instructions, carenotes, and prescriptions; pt and family stated understanding and denied questions/concerns; all f/u appointments in place; IV removed without complicaitons; pt stable at time of discharge  

## 2010-12-31 NOTE — Discharge Summary (Signed)
Physician Discharge Summary  Patient ID: Grant Yang MRN: [id number] DOB/AGE: Nov 03, 1955 55 y.o.  Admit date: 12/28/2010 Discharge date: 12/31/2010  Admission Diagnoses: Cholecystitis, cholelithiasis Uncontrolled diabetes mellitus  Discharge Diagnoses: Cholecystitis, cholelithiasis, uncontrolled diabetes mellitus Principal Problem:  *Acute cholecystitis Active Problems:  DM (diabetes mellitus), type 2, uncontrolled  Noncompliance  Hyponatremia  Hyperbilirubinemia   Discharged Condition: good  Hospital Course: Patient is a 55 year old white male who presented emergency room with worsening right corner abdominal pain, nausea, vomiting. Ultrasound revealed cholelithiasis. He had a leukocytosis. Surgery was consulted. In addition, his blood glucose was in the 300s. He states he takes metformin at home, but was not compliant. He was admitted by the hospitalist for control of his diabetes mellitus. Once his blood sugars were better, he was taken to the operating room on 12/30/2010 and underwent laparoscopic cholecystectomy. Tolerated procedure well. His postoperative course has been unremarkable. His diet was advanced at difficulty.  He is being discharged home in good improving condition.  Consults: general surgery  Significant Diagnostic Studies: See chart  Treatments: surgery: Laparoscopic cholecystectomy on 12/30/2010  Discharge Exam: Blood pressure 132/69, pulse 88, temperature 97.7 F (36.5 C), temperature source Oral, resp. rate 20, height 5\' 11"  (1.803 m), weight 105.235 kg (232 lb), SpO2 94.00%. General appearance: alert, cooperative and no distress Resp: clear to auscultation bilaterally Cardio: regular rate and rhythm, S1, S2 normal, no murmur, click, rub or gallop GI: soft, non-tender; bowel sounds normal; no masses,  no organomegaly. Incisions healing well.  Disposition:  home   Current Discharge Medication List    START taking these medications   Details    metFORMIN (GLUCOPHAGE) 500 MG tablet Take 1 tablet (500 mg total) by mouth 2 (two) times daily with a meal. Qty: 60 tablet, Refills: 2    oxyCODONE-acetaminophen (PERCOCET) 7.5-325 MG per tablet Take 1-2 tablets by mouth every 4 (four) hours as needed for pain. Qty: 40 tablet, Refills: 0      CONTINUE these medications which have NOT CHANGED   Details  calcium carbonate (TUMS - DOSED IN MG ELEMENTAL CALCIUM) 500 MG chewable tablet Chew 2 tablets by mouth daily. For indigestion     naproxen sodium (ANAPROX) 220 MG tablet Take 220 mg by mouth every 8 (eight) hours as needed. For pain        Follow-up Information    Follow up with Kolter Reaver A. Make an appointment on 01/05/2011.   Contact information:   [address] [place] [address] [phone]          Signed: Dalia Heading 12/31/2010, 8:21 AM

## 2010-12-31 NOTE — Addendum Note (Signed)
Addendum  created 12/31/10 1319 by Glynn Octave   Modules edited:Notes Section

## 2011-01-06 ENCOUNTER — Encounter (HOSPITAL_COMMUNITY): Payer: Self-pay | Admitting: General Surgery

## 2016-01-10 ENCOUNTER — Telehealth: Payer: Self-pay | Admitting: *Deleted

## 2016-01-10 NOTE — Telephone Encounter (Signed)
"  I have an appointment for a prostate check at 6:40 pm.  I need to confirm and verify your location."  Appointment verified with outreach and location information provided.

## 2016-03-14 ENCOUNTER — Ambulatory Visit (INDEPENDENT_AMBULATORY_CARE_PROVIDER_SITE_OTHER): Payer: Self-pay | Admitting: Urology

## 2016-03-14 DIAGNOSIS — N5201 Erectile dysfunction due to arterial insufficiency: Secondary | ICD-10-CM

## 2016-03-14 DIAGNOSIS — R972 Elevated prostate specific antigen [PSA]: Secondary | ICD-10-CM

## 2016-03-15 ENCOUNTER — Other Ambulatory Visit: Payer: Self-pay | Admitting: Urology

## 2016-03-15 DIAGNOSIS — R972 Elevated prostate specific antigen [PSA]: Secondary | ICD-10-CM

## 2016-03-28 ENCOUNTER — Encounter (HOSPITAL_COMMUNITY): Payer: Self-pay

## 2016-03-28 ENCOUNTER — Ambulatory Visit (HOSPITAL_COMMUNITY)
Admission: RE | Admit: 2016-03-28 | Discharge: 2016-03-28 | Disposition: A | Discharge status: Home / Self Care | Payer: Self-pay | Source: Ambulatory Visit | Attending: Urology | Admitting: Urology

## 2016-03-28 DIAGNOSIS — R972 Elevated prostate specific antigen [PSA]: Secondary | ICD-10-CM

## 2016-03-28 DIAGNOSIS — C61 Malignant neoplasm of prostate: Secondary | ICD-10-CM | POA: Insufficient documentation

## 2016-03-28 MED ORDER — GENTAMICIN SULFATE 40 MG/ML IJ SOLN
160.0000 mg | Freq: Once | INTRAMUSCULAR | Status: AC
  Administered 2016-03-28: 160 mg via INTRAMUSCULAR

## 2016-03-28 MED ORDER — LIDOCAINE HCL (PF) 2 % IJ SOLN
10.0000 mL | Freq: Once | INTRAMUSCULAR | Status: AC
  Administered 2016-03-28: 10 mL

## 2016-03-28 MED ORDER — LIDOCAINE HCL (PF) 2 % IJ SOLN
INTRAMUSCULAR | Status: AC
  Administered 2016-03-28: 10 mL
  Filled 2016-03-28: qty 10

## 2016-03-28 MED ORDER — GENTAMICIN SULFATE 40 MG/ML IJ SOLN
INTRAMUSCULAR | Status: AC
  Administered 2016-03-28: 160 mg via INTRAMUSCULAR
  Filled 2016-03-28: qty 4

## 2016-03-28 NOTE — Discharge Instructions (Signed)
Transrectal Ultrasound-Guided Biopsy °A transrectal ultrasound-guided biopsy is a procedure to take samples of tissue from your prostate. Ultrasound images are used to guide the procedure. It is usually done to check the prostate gland for cancer. °What happens before the procedure? °· Do not eat or drink after midnight on the night before your procedure. °· Take medicines as your doctor tells you. °· Your doctor may have you stop taking some medicines 5-7 days before the procedure. °· You will be given an enema before your procedure. During an enema, a liquid is put into your butt (rectum) to clear out waste. °· You may have lab tests the day of your procedure. °· Make plans to have someone drive you home. °What happens during the procedure? °· You will be given medicine to help you relax before the procedure. An IV tube will be put into one of your veins. It will be used to give fluids and medicine. °· You will be given medicine to reduce the risk of infection (antibiotic). °· You will be placed on your side. °· A probe with gel will be put in your butt. This is used to take pictures of your prostate and the area around it. °· A medicine to numb the area is put into your prostate. °· A biopsy needle is then inserted and guided to your prostate. °· Samples of prostate tissue are taken. The needle is removed. °· The samples are sent to a lab to be checked. Results are usually back in 2-3 days. °What happens after the procedure? °· You will be taken to a room where you will be watched until you are doing okay. °· You may have some pain in the area around your butt. You will be given medicines for this. °· You may be able to go home the same day. Sometimes, an overnight stay in the hospital is needed. °This information is not intended to replace advice given to you by your health care provider. Make sure you discuss any questions you have with your health care provider. °Document Released: 01/25/2009 Document Revised:  07/15/2015 Document Reviewed: 09/25/2012 °Elsevier Interactive Patient Education © 2017 [employer] Inc[employer] ° °

## 2017-01-29 ENCOUNTER — Inpatient Hospital Stay (HOSPITAL_COMMUNITY)
Admission: EM | Admit: 2017-01-29 | Discharge: 2017-02-03 | DRG: 580 | Disposition: A | Discharge status: Home / Self Care | Payer: Self-pay | Attending: Family Medicine | Admitting: Family Medicine

## 2017-01-29 DIAGNOSIS — Z7984 Long term (current) use of oral hypoglycemic drugs: Secondary | ICD-10-CM

## 2017-01-29 DIAGNOSIS — R17 Unspecified jaundice: Secondary | ICD-10-CM | POA: Diagnosis present

## 2017-01-29 DIAGNOSIS — K219 Gastro-esophageal reflux disease without esophagitis: Secondary | ICD-10-CM | POA: Diagnosis present

## 2017-01-29 DIAGNOSIS — I10 Essential (primary) hypertension: Secondary | ICD-10-CM | POA: Diagnosis present

## 2017-01-29 DIAGNOSIS — C61 Malignant neoplasm of prostate: Secondary | ICD-10-CM | POA: Diagnosis present

## 2017-01-29 DIAGNOSIS — Z23 Encounter for immunization: Secondary | ICD-10-CM

## 2017-01-29 DIAGNOSIS — L03114 Cellulitis of left upper limb: Secondary | ICD-10-CM | POA: Diagnosis present

## 2017-01-29 DIAGNOSIS — Z880 Allergy status to penicillin: Secondary | ICD-10-CM

## 2017-01-29 DIAGNOSIS — Z91199 Patient's noncompliance with other medical treatment and regimen due to unspecified reason: Secondary | ICD-10-CM

## 2017-01-29 DIAGNOSIS — Z79899 Other long term (current) drug therapy: Secondary | ICD-10-CM

## 2017-01-29 DIAGNOSIS — Z9119 Patient's noncompliance with other medical treatment and regimen: Secondary | ICD-10-CM

## 2017-01-29 DIAGNOSIS — Z87891 Personal history of nicotine dependence: Secondary | ICD-10-CM

## 2017-01-29 DIAGNOSIS — IMO0002 Reserved for concepts with insufficient information to code with codable children: Secondary | ICD-10-CM | POA: Diagnosis present

## 2017-01-29 DIAGNOSIS — E871 Hypo-osmolality and hyponatremia: Secondary | ICD-10-CM | POA: Diagnosis present

## 2017-01-29 DIAGNOSIS — L02512 Cutaneous abscess of left hand: Principal | ICD-10-CM | POA: Diagnosis present

## 2017-01-29 DIAGNOSIS — E1165 Type 2 diabetes mellitus with hyperglycemia: Secondary | ICD-10-CM

## 2017-01-29 DIAGNOSIS — Z8673 Personal history of transient ischemic attack (TIA), and cerebral infarction without residual deficits: Secondary | ICD-10-CM

## 2017-01-29 DIAGNOSIS — R739 Hyperglycemia, unspecified: Secondary | ICD-10-CM

## 2017-01-29 DIAGNOSIS — I889 Nonspecific lymphadenitis, unspecified: Secondary | ICD-10-CM

## 2017-01-29 DIAGNOSIS — E861 Hypovolemia: Secondary | ICD-10-CM | POA: Diagnosis present

## 2017-01-30 ENCOUNTER — Emergency Department (HOSPITAL_COMMUNITY): Payer: Self-pay

## 2017-01-30 ENCOUNTER — Observation Stay (HOSPITAL_COMMUNITY): Payer: Self-pay

## 2017-01-30 ENCOUNTER — Encounter (HOSPITAL_COMMUNITY): Payer: Self-pay | Admitting: *Deleted

## 2017-01-30 ENCOUNTER — Other Ambulatory Visit: Payer: Self-pay

## 2017-01-30 DIAGNOSIS — E1165 Type 2 diabetes mellitus with hyperglycemia: Secondary | ICD-10-CM

## 2017-01-30 DIAGNOSIS — I10 Essential (primary) hypertension: Secondary | ICD-10-CM | POA: Diagnosis present

## 2017-01-30 DIAGNOSIS — C61 Malignant neoplasm of prostate: Secondary | ICD-10-CM

## 2017-01-30 DIAGNOSIS — L03114 Cellulitis of left upper limb: Secondary | ICD-10-CM | POA: Diagnosis present

## 2017-01-30 LAB — CBC WITH DIFFERENTIAL/PLATELET
BASOS ABS: 0.1 10*3/uL (ref 0.0–0.1)
BASOS PCT: 0 %
Basophils Absolute: 0 10*3/uL (ref 0.0–0.1)
Basophils Relative: 0 %
EOS ABS: 0.3 10*3/uL (ref 0.0–0.7)
EOS ABS: 0.3 10*3/uL (ref 0.0–0.7)
EOS PCT: 2 %
Eosinophils Relative: 2 %
HCT: 46 % (ref 39.0–52.0)
HCT: 47.9 % (ref 39.0–52.0)
HEMOGLOBIN: 15.3 g/dL (ref 13.0–17.0)
HEMOGLOBIN: 16.2 g/dL (ref 13.0–17.0)
Lymphocytes Relative: 10 %
Lymphocytes Relative: 13 %
Lymphs Abs: 2 10*3/uL (ref 0.7–4.0)
Lymphs Abs: 2.2 10*3/uL (ref 0.7–4.0)
MCH: 29.9 pg (ref 26.0–34.0)
MCH: 30.2 pg (ref 26.0–34.0)
MCHC: 33.3 g/dL (ref 30.0–36.0)
MCHC: 33.8 g/dL (ref 30.0–36.0)
MCV: 89.4 fL (ref 78.0–100.0)
MCV: 90 fL (ref 78.0–100.0)
MONOS PCT: 9 %
Monocytes Absolute: 1.5 10*3/uL — ABNORMAL HIGH (ref 0.1–1.0)
Monocytes Absolute: 1.8 10*3/uL — ABNORMAL HIGH (ref 0.1–1.0)
Monocytes Relative: 9 %
NEUTROS ABS: 15.6 10*3/uL — AB (ref 1.7–7.7)
NEUTROS PCT: 76 %
NEUTROS PCT: 79 %
Neutro Abs: 12.5 10*3/uL — ABNORMAL HIGH (ref 1.7–7.7)
PLATELETS: 262 10*3/uL (ref 150–400)
Platelets: 273 10*3/uL (ref 150–400)
RBC: 5.11 MIL/uL (ref 4.22–5.81)
RBC: 5.36 MIL/uL (ref 4.22–5.81)
RDW: 13.1 % (ref 11.5–15.5)
RDW: 13.1 % (ref 11.5–15.5)
WBC: 16.5 10*3/uL — ABNORMAL HIGH (ref 4.0–10.5)
WBC: 19.8 10*3/uL — ABNORMAL HIGH (ref 4.0–10.5)

## 2017-01-30 LAB — BASIC METABOLIC PANEL
Anion gap: 10 (ref 5–15)
BUN: 12 mg/dL (ref 6–20)
CALCIUM: 8.6 mg/dL — AB (ref 8.9–10.3)
CO2: 21 mmol/L — AB (ref 22–32)
CREATININE: 0.72 mg/dL (ref 0.61–1.24)
Chloride: 103 mmol/L (ref 101–111)
GFR calc non Af Amer: >60 mL/min (ref >60)
Glucose, Bld: 273 mg/dL — ABNORMAL HIGH (ref 65–99)
Potassium: 3.7 mmol/L (ref 3.5–5.1)
SODIUM: 134 mmol/L — AB (ref 135–145)

## 2017-01-30 LAB — COMPREHENSIVE METABOLIC PANEL
ALK PHOS: 85 U/L (ref 38–126)
ALT: 17 U/L (ref 17–63)
ANION GAP: 10 (ref 5–15)
AST: 16 U/L (ref 15–41)
Albumin: 4 g/dL (ref 3.5–5.0)
BUN: 14 mg/dL (ref 6–20)
CALCIUM: 9.3 mg/dL (ref 8.9–10.3)
CO2: 21 mmol/L — ABNORMAL LOW (ref 22–32)
CREATININE: 0.8 mg/dL (ref 0.61–1.24)
Chloride: 99 mmol/L — ABNORMAL LOW (ref 101–111)
Glucose, Bld: 422 mg/dL — ABNORMAL HIGH (ref 65–99)
Potassium: 3.9 mmol/L (ref 3.5–5.1)
Sodium: 130 mmol/L — ABNORMAL LOW (ref 135–145)
TOTAL PROTEIN: 7.5 g/dL (ref 6.5–8.1)
Total Bilirubin: 1.6 mg/dL — ABNORMAL HIGH (ref 0.3–1.2)

## 2017-01-30 LAB — GLUCOSE, CAPILLARY
GLUCOSE-CAPILLARY: 247 mg/dL — AB (ref 65–99)
GLUCOSE-CAPILLARY: 282 mg/dL — AB (ref 65–99)
GLUCOSE-CAPILLARY: 198 mg/dL — AB (ref 65–99)
Glucose-Capillary: 254 mg/dL — ABNORMAL HIGH (ref 65–99)
Glucose-Capillary: 175 mg/dL — ABNORMAL HIGH (ref 65–99)

## 2017-01-30 LAB — HEMOGLOBIN A1C
HEMOGLOBIN A1C: 10.4 % — AB (ref 4.8–5.6)
MEAN PLASMA GLUCOSE: 251.78 mg/dL

## 2017-01-30 LAB — LACTIC ACID, PLASMA
LACTIC ACID, VENOUS: 1.7 mmol/L (ref 0.5–1.9)
LACTIC ACID, VENOUS: 1.2 mmol/L (ref 0.5–1.9)
Lactic Acid, Venous: 1.5 mmol/L (ref 0.5–1.9)

## 2017-01-30 LAB — BILIRUBIN, FRACTIONATED(TOT/DIR/INDIR)
BILIRUBIN TOTAL: 1.5 mg/dL — AB (ref 0.3–1.2)
Bilirubin, Direct: 0.2 mg/dL (ref 0.1–0.5)
Indirect Bilirubin: 1.3 mg/dL — ABNORMAL HIGH (ref 0.3–0.9)

## 2017-01-30 LAB — C-REACTIVE PROTEIN: CRP: 6.6 mg/dL — AB (ref <1.0)

## 2017-01-30 LAB — SEDIMENTATION RATE: SED RATE: 17 mm/h — AB (ref 0–16)

## 2017-01-30 MED ORDER — VANCOMYCIN HCL 10 G IV SOLR
1500.0000 mg | Freq: Once | INTRAVENOUS | Status: AC
  Administered 2017-01-30: 1500 mg via INTRAVENOUS
  Filled 2017-01-30: qty 1500

## 2017-01-30 MED ORDER — INSULIN ASPART 100 UNIT/ML ~~LOC~~ SOLN
0.0000 [IU] | Freq: Three times a day (TID) | SUBCUTANEOUS | Status: DC
  Administered 2017-01-30: 3 [IU] via SUBCUTANEOUS
  Administered 2017-01-30: 5 [IU] via SUBCUTANEOUS
  Administered 2017-01-30: 8 [IU] via SUBCUTANEOUS
  Administered 2017-01-31: 5 [IU] via SUBCUTANEOUS
  Administered 2017-01-31: 3 [IU] via SUBCUTANEOUS
  Administered 2017-01-31: 5 [IU] via SUBCUTANEOUS
  Administered 2017-02-01: 2 [IU] via SUBCUTANEOUS
  Administered 2017-02-01: 3 [IU] via SUBCUTANEOUS
  Administered 2017-02-01: 2 [IU] via SUBCUTANEOUS
  Administered 2017-02-02: 3 [IU] via SUBCUTANEOUS
  Administered 2017-02-02 (×2): 5 [IU] via SUBCUTANEOUS
  Administered 2017-02-03: 3 [IU] via SUBCUTANEOUS
  Administered 2017-02-03: 8 [IU] via SUBCUTANEOUS

## 2017-01-30 MED ORDER — SODIUM CHLORIDE 0.9 % IV BOLUS (SEPSIS)
1000.0000 mL | Freq: Once | INTRAVENOUS | Status: AC
  Administered 2017-01-30: 1000 mL via INTRAVENOUS

## 2017-01-30 MED ORDER — GADOBENATE DIMEGLUMINE 529 MG/ML IV SOLN
18.0000 mL | Freq: Once | INTRAVENOUS | Status: AC | PRN
  Administered 2017-01-30: 18 mL via INTRAVENOUS

## 2017-01-30 MED ORDER — HYDROCODONE-ACETAMINOPHEN 5-325 MG PO TABS
1.0000 | ORAL_TABLET | ORAL | Status: DC | PRN
  Administered 2017-01-31 – 2017-02-01 (×3): 2 via ORAL
  Administered 2017-02-01 – 2017-02-02 (×2): 1 via ORAL
  Filled 2017-01-30: qty 1
  Filled 2017-01-30 (×3): qty 2
  Filled 2017-01-30: qty 1

## 2017-01-30 MED ORDER — SENNOSIDES-DOCUSATE SODIUM 8.6-50 MG PO TABS: 1.0000 | ORAL_TABLET | Freq: Every evening | ORAL | Status: DC | PRN

## 2017-01-30 MED ORDER — ACETAMINOPHEN 650 MG RE SUPP: 650.0000 mg | Freq: Four times a day (QID) | RECTAL | Status: DC | PRN

## 2017-01-30 MED ORDER — BISACODYL 5 MG PO TBEC: 5.0000 mg | DELAYED_RELEASE_TABLET | Freq: Every day | ORAL | Status: DC | PRN

## 2017-01-30 MED ORDER — ONDANSETRON HCL 4 MG/2ML IJ SOLN: 4.0000 mg | Freq: Four times a day (QID) | INTRAMUSCULAR | Status: DC | PRN

## 2017-01-30 MED ORDER — TETANUS-DIPHTH-ACELL PERTUSSIS 5-2.5-18.5 LF-MCG/0.5 IM SUSP
0.5000 mL | Freq: Once | INTRAMUSCULAR | Status: AC
  Administered 2017-01-30: 0.5 mL via INTRAMUSCULAR
  Filled 2017-01-30: qty 0.5

## 2017-01-30 MED ORDER — INSULIN GLARGINE 100 UNIT/ML ~~LOC~~ SOLN
10.0000 [IU] | Freq: Every day | SUBCUTANEOUS | Status: DC
  Administered 2017-01-30 – 2017-02-01 (×3): 10 [IU] via SUBCUTANEOUS
  Filled 2017-01-30 (×4): qty 0.1

## 2017-01-30 MED ORDER — CLINDAMYCIN PHOSPHATE 600 MG/50ML IV SOLN
600.0000 mg | Freq: Once | INTRAVENOUS | Status: AC
  Administered 2017-01-30: 600 mg via INTRAVENOUS
  Filled 2017-01-30: qty 50

## 2017-01-30 MED ORDER — PNEUMOCOCCAL VAC POLYVALENT 25 MCG/0.5ML IJ INJ
0.5000 mL | INJECTION | INTRAMUSCULAR | Status: AC
  Administered 2017-01-31: 0.5 mL via INTRAMUSCULAR
  Filled 2017-01-30: qty 0.5

## 2017-01-30 MED ORDER — INSULIN ASPART 100 UNIT/ML ~~LOC~~ SOLN
7.0000 [IU] | Freq: Once | SUBCUTANEOUS | Status: AC
  Administered 2017-01-30: 7 [IU] via INTRAVENOUS
  Filled 2017-01-30: qty 1

## 2017-01-30 MED ORDER — LIVING WELL WITH DIABETES BOOK
Freq: Once | Status: AC
  Administered 2017-01-30: 11:00:00
  Filled 2017-01-30: qty 1

## 2017-01-30 MED ORDER — ENOXAPARIN SODIUM 40 MG/0.4ML ~~LOC~~ SOLN
40.0000 mg | SUBCUTANEOUS | Status: DC
  Administered 2017-01-30 – 2017-01-31 (×2): 40 mg via SUBCUTANEOUS
  Filled 2017-01-30 (×3): qty 0.4

## 2017-01-30 MED ORDER — ONDANSETRON HCL 4 MG PO TABS: 4.0000 mg | ORAL_TABLET | Freq: Four times a day (QID) | ORAL | Status: DC | PRN

## 2017-01-30 MED ORDER — SODIUM CHLORIDE 0.9 % IV SOLN
INTRAVENOUS | Status: AC
  Administered 2017-01-30: 06:00:00 via INTRAVENOUS
  Administered 2017-01-30: 1000 mL via INTRAVENOUS
  Administered 2017-01-30: 09:00:00 via INTRAVENOUS

## 2017-01-30 MED ORDER — ACETAMINOPHEN 325 MG PO TABS: 650.0000 mg | ORAL_TABLET | Freq: Four times a day (QID) | ORAL | Status: DC | PRN

## 2017-01-30 MED ORDER — INFLUENZA VAC SPLIT QUAD 0.5 ML IM SUSY
0.5000 mL | PREFILLED_SYRINGE | INTRAMUSCULAR | Status: AC
  Administered 2017-01-31: 0.5 mL via INTRAMUSCULAR
  Filled 2017-01-30: qty 0.5

## 2017-01-30 MED ORDER — CLINDAMYCIN PHOSPHATE 600 MG/50ML IV SOLN
600.0000 mg | Freq: Three times a day (TID) | INTRAVENOUS | Status: DC
  Administered 2017-01-30: 600 mg via INTRAVENOUS
  Filled 2017-01-30: qty 50

## 2017-01-30 MED ORDER — VANCOMYCIN HCL IN DEXTROSE 1-5 GM/200ML-% IV SOLN
1000.0000 mg | Freq: Two times a day (BID) | INTRAVENOUS | Status: DC
  Administered 2017-01-31 – 2017-02-02 (×6): 1000 mg via INTRAVENOUS
  Filled 2017-01-30 (×6): qty 200

## 2017-01-30 MED ORDER — INSULIN ASPART 100 UNIT/ML ~~LOC~~ SOLN
0.0000 [IU] | Freq: Every day | SUBCUTANEOUS | Status: DC
  Administered 2017-01-31: 2 [IU] via SUBCUTANEOUS

## 2017-01-30 MED ORDER — HYDRALAZINE HCL 20 MG/ML IJ SOLN: 10.0000 mg | INTRAMUSCULAR | Status: DC | PRN

## 2017-01-30 MED ORDER — INSULIN STARTER KIT- PEN NEEDLES (ENGLISH)
1.0000 | Freq: Once | Status: AC
  Administered 2017-01-30: 1
  Filled 2017-01-30: qty 1

## 2017-01-30 NOTE — Progress Notes (Addendum)
Grant Yang, Grant Yang, No Pcp Per  Brief History:  61 year old male with a history of diabetes mellitus, prostate cancer, and hypertension presenting with 2-day history of increasing pain, edema, and erythema of his left hand.  The Yang states that while moving wood, I would splinter lodged in the webspace between his left first and second digits approximately 2 weeks prior to admission.  Around January 26, 2017, he noticed increasing erythema, edema, and pain on his left hand at the base of his left thumb.  He tried to pick the splinter out with a toothpick, so needle, and utility knife and ultimately was able to get the splinter out on January 27, 2017.  However he continued to have increasing pain, edema, and streaking erythema proximally to the wrist.  As a result, the Yang presented for further evaluation.  He denied any fevers, chills, chest pain, shortness breath, nausea, vomiting, diarrhea, abdominal pain.  In the emergency department, the Yang was started on clindamycin IV.  Assessment/Plan: Cellulitis Left Hand -pt notes increasing edema and erythema even since starting clindamycin -MRI hand -d/c clinda -start vancomycin -ESR -CRP -X-ray left hand negative for foreign body or fracture  Diabetes mellitus type 2, uncontrolled -Initially diagnosed 1995 -Yang has not seen a physician for nearly 6 years -He has been taking his friend's metformin intermittently -Start Lantus 10 units daily -Continue NovoLog sliding scale -Hemoglobin A1c  Hyponatremia -Secondary to hyperglycemia -Yang received IV fluids -A.m. BMP  Hyperbilirubinemia -fractionate bili  Prostate cancer -Outpatient follow-up   Disposition Plan:   Home in 2-3 days  Family Communication:   Friend updated at bedside 12/11--Total time spent 35 minutes.  Greater than 50% spent face to face counseling and coordinating care.  1245 to 1220   Consultants:  none  Code Status:  FULL  DVT Prophylaxis:  SCDs   Procedures: As Listed in Grant Note Above  Antibiotics: Clindamycin 12/10>>>12/11 Vancomycin 12/11>>>12/1    Subjective: Yang states that his left hand is more erythematous and edematous than yesterday.  He denies any fevers, chills, chest pain, shortness breath, nausea, vomiting, diarrhea, abdominal pain.  Complains of moderate pain in his left hand between the first and second web interspace.  Objective: Vitals:   01/30/17 0100 01/30/17 0130 01/30/17 0545 01/30/17 0609  BP: (!) 130/95 120/67 (!) 109/91 126/77  Pulse: 98 85 83 80  Resp:   16 16  Temp:    98.5 F (36.9 C)  TempSrc:    Oral  SpO2: 97% 96% 99% 99%  Weight:    87.6 kg (193 lb 3.2 oz)  Height:    5' 11"  (1.803 m)    Intake/Output Summary (Last 24 hours) at 01/30/2017 1306 Last data filed at 01/30/2017 0900 Gross per 24 hour  Intake 2467.92 ml  Output -  Net 2467.92 ml   Weight change:  Exam:   General:  Pt is alert, follows commands appropriately, not in acute distress  HEENT: No icterus, No thrush, No neck mass, [place]/AT  Cardiovascular: RRR, S1/S2, no rubs, no gallops  Respiratory: CTA bilaterally, no wheezing, no crackles, no rhonchi  Abdomen: Soft/+BS, non tender, non distended, no guarding  Extremities: No edema, No lymphangitis, No petechiae, No rashes, no synovitis.  See left hand below         Data Reviewed: I have personally reviewed following labs and imaging studies Basic Metabolic Panel: Recent Labs  Lab 01/30/17 0031 01/30/17 0508  NA 130* 134*  K 3.9 3.7  CL 99* 103  CO2 21* 21*  GLUCOSE 422* 273*  BUN 14 12  CREATININE 0.80 0.72  CALCIUM 9.3 8.6*   Liver Function Tests: Recent Labs  Lab 01/30/17 0031  AST 16  ALT 17  ALKPHOS 85  BILITOT 1.6*  PROT 7.5  ALBUMIN 4.0   No results for input(s): LIPASE, AMYLASE in the last 168 hours. No results for input(s): AMMONIA in  the last 168 hours. Coagulation Profile: No results for input(s): INR, PROTIME in the last 168 hours. CBC: Recent Labs  Lab 01/30/17 0031 01/30/17 0508  WBC Yang.8* 16.5*  NEUTROABS 15.6* 12.5*  HGB 16.2 15.3  HCT 47.9 46.0  MCV 89.4 90.0  PLT 273 262   Cardiac Enzymes: No results for input(s): CKTOTAL, CKMB, CKMBINDEX, TROPONINI in the last 168 hours. BNP: Invalid input(s): POCBNP CBG: Recent Labs  Lab 01/30/17 0635 01/30/17 0817 01/30/17 1204  GLUCAP 254* 247* 282*   HbA1C: Recent Labs    01/30/17 0031  HGBA1C 10.4*   Urine analysis:    Component Value Date/Time   COLORURINE YELLOW 12/28/2010 0813   APPEARANCEUR CLEAR 12/28/2010 0813   LABSPEC 1.025 12/28/2010 0813   PHURINE 5.0 12/28/2010 0813   GLUCOSEU >1000 (A) 12/28/2010 0813   HGBUR TRACE (A) 12/28/2010 0813   BILIRUBINUR NEGATIVE 12/28/2010 0813   KETONESUR NEGATIVE 12/28/2010 0813   PROTEINUR 100 (A) 12/28/2010 0813   UROBILINOGEN 0.2 12/28/2010 0813   NITRITE NEGATIVE 12/28/2010 0813   LEUKOCYTESUR NEGATIVE 12/28/2010 0813   Sepsis Labs: @LABRCNTIP (procalcitonin:4,lacticidven:4) )No results found for this or any previous visit (from the past 240 hour(s)).   Scheduled Meds: . enoxaparin (LOVENOX) injection  40 mg Subcutaneous Q24H  . [START ON 01/31/2017] Influenza vac split quadrivalent PF  0.5 mL Intramuscular Tomorrow-1000  . insulin aspart  0-15 Units Subcutaneous TID WC  . insulin aspart  0-5 Units Subcutaneous QHS  . insulin starter kit- pen needles  1 kit Other Once  . [START ON 01/31/2017] pneumococcal 23 valent vaccine  0.5 mL Intramuscular Tomorrow-1000   Continuous Infusions: . sodium chloride 125 mL/hr at 01/30/17 0835  . clindamycin (CLEOCIN) IV Stopped (01/30/17 1229)    Procedures/Studies: Dg Hand Complete Left  Result Date: 01/30/2017 CLINICAL DATA:  Had a splinter at the base of the left thumb 1 week ago. Persistent pain and swelling in this region. Concern for persistent  foreign body. EXAM: LEFT HAND - COMPLETE 3+ VIEW COMPARISON:  None. FINDINGS: No radiopaque foreign bodies are seen. Note that a wood splinter is often not visible on radiograph. There is no evidence of osseous disruption. Visualized joint spaces are grossly preserved. Subcortical cysts are noted at the distal aspect of the second metacarpal, distal aspect of the third proximal phalanx, and distal aspect of the fourth middle phalanx. The carpal rows appear grossly intact, and demonstrate normal alignment. Mild soft tissue swelling is suggested about the wrist and at the base of the thumb. IMPRESSION: 1. No evidence of osseous disruption. 2. No radiopaque foreign body seen. Note that a wood splinter is often not visible on radiograph. Electronically Signed   By: Garald Balding M.D.   On: 01/30/2017 01:29    Orson Eva, DO  [facility] Pager [phone]  If 7PM-7AM, please contact night-coverage [email or web address] Password [email or web address] 01/30/2017, 1:06 PM   LOS: 0 days

## 2017-01-30 NOTE — Progress Notes (Signed)
MR results noted with small abscess in the subcutaneous fatty tissues along the volar margin of the first MCP joint on the radial side. -spoke with Dr. Aline Brochure who will graciously see patient in consult on 01/31/17 -pt remains afebrile and hemodynamically stable  DTat

## 2017-01-30 NOTE — Progress Notes (Signed)
  RD consulted for nutrition education regarding diabetes. The patient says he has been diabetic since the mid 90's. He has not bee attempting to manage his disease through diet. Follows a regular diet at home but does eat consistent intervals through the day based on diet recall.   Lab Results  Component Value Date   HGBA1C 10.4 (H) 01/30/2017    RD provided "Carbohydrate Counting and Plate Method " handouts reviewed. Discussed different food groups and their effects on blood sugar, emphasizing carbohydrate-containing foods. Provided list of carbohydrates and recommended serving sizes of common foods.  Discussed importance of controlled and consistent carbohydrate intake throughout the day. Provided examples of ways to balance meals/snacks and encouraged intake of high-fiber, whole grain complex carbohydrates. Teach back method used. Nursing provided "Living Well with Diabetes during RD visit. RD encouraged pt to read the book thoroughly while he is here and nursing or dietitians will be glad to return and answer questions.   Expect fair compliance. The patient openly admits that he doesn't check his blood glucose or ever think about following a cho modified diet. Encouraged him to consider the detrimental affects of non-compliance and poorly managed diabetes disease long term. He says he has past family hx of DM and his grandmother had two amputation surgeries.   Body mass index is 26.95 kg/m. Pt meets criteria for overweight based on current BMI.  Current diet order is CHO modifed, patient is consuming approximately 75-100% of meals at this time. Labs and medications reviewed. No further nutrition interventions warranted at this time. RD contact information provided. If additional nutrition issues arise, please re-consult RD.  Colman Cater MS,RD,CSG,LDN Office: [phone] Pager: [phone]

## 2017-01-30 NOTE — Progress Notes (Addendum)
Inpatient Diabetes Program Recommendations  AACE/ADA: New Consensus Statement on Inpatient Glycemic Control (2015)  Target Ranges:  Prepandial:   less than 140 mg/dL      Peak postprandial:   less than 180 mg/dL (1-2 hours)      Critically ill patients:  140 - 180 mg/dL  Results for MORTON, SIMSON (MRN [id number]) as of 01/30/2017 07:29  Ref. Range 01/30/2017 00:31 01/30/2017 05:08  Glucose Latest Ref Range: 65 - 99 mg/dL 422 (H) 273 (H)   Results for MARSHEL, GOLUBSKI (MRN [id number]) as of 01/30/2017 07:29  Ref. Range 01/30/2017 06:35  Glucose-Capillary Latest Ref Range: 65 - 99 mg/dL 254 (H)   Review of Glycemic Control  Diabetes history: DM2 Outpatient Diabetes medications: Metformin 500 mg BID Current orders for Inpatient glycemic control: Novolog 0-15 units TID with meals, Novolog 0-5 units QHS  Inpatient Diabetes Program Recommendations:  Insulin - Basal: Please consider ordering Lantus 13 units Q24H (based on 87.6 kg x 0.15 units). HgbA1C: A1C in process.  Note: Noted patient has a history of DM2 but previously had no insurance or PCP. Per H&P, patient has recently gotten insurance. However, insurance information is noted listed in the chart. Will plan to talk with patient today. Ordered: Living Well with DM book, insulin starter kit, CM consult,  RD consult, and patient education by bedside nursing on DM and insulin teaching (in case patient is discharged on insulin).  Addendum 01/30/17@13 :05-Spoke with patient about diabetes and home regimen for diabetes control. Patient reports that he has been dx with DM for many years but he has not seen a doctor in over 5 years. Patient has been taking Metformin he received from a friend that was switched over the insulin. Patient admits that he had started taking the Metformin but has not taken any in the last 2 months. Patient reports that he has a glucometer and testing supplies at home but he is not checking glucose.  Inquired about what  an A1C is and patient is not sure what an A1C is. Discussed A1C and explained what it is. Informed patient that he A1C was not resulted at the time of our discussion but encouraged patient to ask RN or MD for results while he is inpatient.  Discussed glucose and A1C goals. Discussed importance of checking CBGs and maintaining good CBG control to prevent long-term and short-term complications. Explained how hyperglycemia leads to damage within blood vessels which lead to the common complications seen with uncontrolled diabetes. Stressed to the patient the importance of improving glycemic control to prevent further complications from uncontrolled diabetes. Discussed impact of nutrition, exercise, stress, sickness, and medications on diabetes control. Patient reports that RD has already talked with him regarding Carb Modified diet. Patient stated that he recently purchased insurance but it will not be effective until 02/20/17. Therefore, patient will need affordable insulin for discharge and will need CM to assist with follow up care. Discussed current insulin regimen with Lantus and Novolog and discussed cost of insulin vial versus insulin pens. If patient is going to discharge on insulin, it would be recommended he discharge on NOVOLIN insulin (NOVOLIN 70/30, NPH, and/or Regular) which can be purchased at [facility] for $25 per vial. Patient is agreeable to taking insulin if needed.   Discussed 70/30 insulin in detail (how to take it, when to take it). Asked patient to check his glucose 3-4 times per day (before meals and at bedtime) and to keep a log book of glucose readings. Explained how  the doctor he follows up with can use the log book to continue to make adjustments with DM medications if needed. Reviewed and demonstrated how to draw up and administer insulin with vial and syringe. Patient was able to successfully demonstrate how to draw up and administer insulin with vial and syringe. Informed patient that RN will  be asking him to self-administer insulin to ensure proper technique and ability to administer self insulin shots.   Patient verbalized understanding of information discussed and he states that he has no further questions at this time related to diabetes.   RNs to provide ongoing basic DM education at bedside with this patient and engage patient to actively check blood glucose and administer insulin injections.   Thanks, Barnie Alderman, RN, MSN, CDE Diabetes Coordinator Inpatient Diabetes Program [phone] (Team Pager from 8am to 5pm)

## 2017-01-30 NOTE — Plan of Care (Signed)
Living well with diabetes booklet given and reviewed diabetes education with patient. Discussed importance of checking blood sugars, signs and symptoms of hypoglycemia/hyperglycemia, treatment for both, and insulin administration. Demonstrated insulin administration and pt self-administered lantus insulin injection himself this afternoon. Verbalized understanding of education and demonstrated correct administration. Instructed to notify nursing staff of any questions or concerns. Pt states he has working CBG meters at home and understands how to check blood sugars. Donavan Foil, RN

## 2017-01-30 NOTE — Progress Notes (Signed)
Pharmacy Antibiotic Note  Grant Yang is a 61 y.o. male admitted on 01/29/2017 with wound infection.  Pharmacy has been consulted for VANCOMYCIN dosing.  Plan: Vancomycin 1500mg  x 1 (loading dose) then Vancomycin 1000 IV every 12 hours.  Goal trough 10-15 mcg/mL.  Monitor labs, progress, c/s - check trough at steady state  Height: 5\' 11"  (180.3 cm) Weight: 193 lb 3.2 oz (87.6 kg) IBW/kg (Calculated) : 75.3  Temp (24hrs), Avg:98.6 F (37 C), Min:98.5 F (36.9 C), Max:98.7 F (37.1 C)  Recent Labs  Lab 01/30/17 0031 01/30/17 0155 01/30/17 0508 01/30/17 0839  WBC 19.8*  --  16.5*  --   CREATININE 0.80  --  0.72  --   LATICACIDVEN  --  1.7 1.2 1.5    Estimated Creatinine Clearance: 105.9 mL/min (by C-G formula based on SCr of 0.72 mg/dL).    Allergies  Allergen Reactions  . Penicillins Other (See Comments)    Childhood allergy. Has patient had a PCN reaction causing immediate rash, facial/tongue/throat swelling, SOB or lightheadedness with hypotension: Unknown Has patient had a PCN reaction causing severe rash involving mucus membranes or skin necrosis: Unknown Has patient had a PCN reaction that required hospitalization: Unknown Has patient had a PCN reaction occurring within the last 10 years: No If all of the above answers are "NO", then may proceed with Cephalosporin use.    Antimicrobials this admission: Vancomycin 12/11 >>   Dose adjustments this admission:  Microbiology results:  None pending  Thank you for allowing pharmacy to be a part of this patient's care.  Hart Robinsons A 01/30/2017 1:26 PM

## 2017-01-30 NOTE — H&P (Signed)
History and Physical    Grant Yang DOB: 1955/08/26 DOA: 01/29/2017  PCP: Patient, No Pcp Per   Patient coming from: Home  Chief Complaint: Left hand pain, redness, and swelling   HPI: Grant Yang is a 61 y.o. male with medical history significant for diabetes mellitus, hypertension, and prostate cancer, now presenting to the emergency department for evaluation of pain, redness, and swelling involving the left hand.  Patient reports that he was moving some wood approximately 1 week ago, had a splinter lodged in the webspace between the first and second digits of the left hand, was able to remove it, but subsequently developed redness, swelling, and this at that site.  Since that time, redness, swelling, and pain have increased and have begun to spread proximally to involve the distal forearm.  The patient denies any fevers or chills.  He denies chest pain, cough, or dyspnea.  There has not been any purulent discharge from the site.  Patient reports that he has, until recently, been without insurance and has not been treated for his chronic medical conditions.  Recently obtained insurance and reports follow-up regarding his prostate cancer scheduled for February 2019.  ED Course: Upon arrival to the ED, patient is found to be afebrile, saturating well on room air, and with vitals otherwise stable.  Chemistry panel is notable for sodium of 130, bicarbonate 21, and glucose of 422.  CBC features a leukocytosis to 19,800.  Radiographs of the left hand are negative for radiopaque foreign body or osseous abnormality.  She was given a liter of normal saline, 7 units IV NovoLog, empiric clindamycin, and had his Tdap updated.  He remains hemodynamically stable, in no apparent respiratory distress, and will be admitted to the medical/surgical unit for ongoing evaluation and management of left hand cellulitis and marked hyperglycemia.  Review of Systems:  All other systems reviewed and  apart from HPI, are negative.  Past Medical History:  Diagnosis Date  . Diabetes mellitus   . GERD (gastroesophageal reflux disease)   . Hypertension   . Shortness of breath    patient states it hurts to breath  . Stroke [facility])    "mini stroke" patient cant remember when    Past Surgical History:  Procedure Laterality Date  . APPENDECTOMY    . CHOLECYSTECTOMY  12/30/2010   Procedure: LAPAROSCOPIC CHOLECYSTECTOMY;  Surgeon: Jamesetta So;  Location: [facility];  Service: General;  Laterality: N/A;     reports that he has quit smoking. he has never used smokeless tobacco. He reports that he does not drink alcohol or use drugs.  Allergies  Allergen Reactions  . Penicillins Other (See Comments)    Childhood allergy    History reviewed. No pertinent family history.   Prior to Admission medications   Medication Sig Start Date End Date Taking? Authorizing Provider  calcium carbonate (TUMS - DOSED IN MG ELEMENTAL CALCIUM) 500 MG chewable tablet Chew 2 tablets by mouth daily. For indigestion     [provider]  metFORMIN (GLUCOPHAGE) 500 MG tablet Take 1 tablet (500 mg total) by mouth 2 (two) times daily with a meal. 12/31/10 12/31/11  Aviva Signs, MD  naproxen sodium (ANAPROX) 220 MG tablet Take 220 mg by mouth every 8 (eight) hours as needed. For pain     [provider]    Physical Exam: Vitals:   01/30/17 0005 01/30/17 0010  BP:  (!) 146/97  Pulse:  95  Resp:  16  Temp:  98.7 F (37.1 C)  TempSrc:  Oral  SpO2:  96%  Weight: 89.8 kg (198 lb)   Height: 5\' 11"  (1.803 m)       Constitutional: No respiratory distress, calm Eyes: PERTLA, lids and conjunctivae normal ENMT: Mucous membranes are moist. Posterior pharynx clear of any exudate or lesions.   Neck: normal, supple, no masses, no thyromegaly Respiratory: clear to auscultation bilaterally, no wheezing, no crackles. Normal respiratory effort.  Cardiovascular: S1 & S2 heard, regular rate and rhythm.  No extremity edema. No significant JVD. Abdomen: No distension, no tenderness, no masses palpated. Bowel sounds normal.  Musculoskeletal: no clubbing / cyanosis. No joint deformity upper and lower extremities.  Skin: Redness, swelling, heat, and tenderness at left hand, centered around small laceration in webspace between 1st and 2nd digits, extending proximally to distal forearm. Poor turgor. Neurologic: CN 2-12 grossly intact. Sensation intact. Strength 5/5 in all 4 limbs.  Psychiatric: Alert and oriented x 3. Pleasant and cooperative.     Labs on Admission: I have personally reviewed following labs and imaging studies  CBC: Recent Labs  Lab 01/30/17 0031  WBC 19.8*  NEUTROABS 15.6*  HGB 16.2  HCT 47.9  MCV 89.4  PLT 403   Basic Metabolic Panel: Recent Labs  Lab 01/30/17 0031  NA 130*  K 3.9  CL 99*  CO2 21*  GLUCOSE 422*  BUN 14  CREATININE 0.80  CALCIUM 9.3   GFR: Estimated Creatinine Clearance: 105.9 mL/min (by C-G formula based on SCr of 0.8 mg/dL). Liver Function Tests: Recent Labs  Lab 01/30/17 0031  AST 16  ALT 17  ALKPHOS 85  BILITOT 1.6*  PROT 7.5  ALBUMIN 4.0   No results for input(s): LIPASE, AMYLASE in the last 168 hours. No results for input(s): AMMONIA in the last 168 hours. Coagulation Profile: No results for input(s): INR, PROTIME in the last 168 hours. Cardiac Enzymes: No results for input(s): CKTOTAL, CKMB, CKMBINDEX, TROPONINI in the last 168 hours. BNP (last 3 results) No results for input(s): PROBNP in the last 8760 hours. HbA1C: No results for input(s): HGBA1C in the last 72 hours. CBG: No results for input(s): GLUCAP in the last 168 hours. Lipid Profile: No results for input(s): CHOL, HDL, LDLCALC, TRIG, CHOLHDL, LDLDIRECT in the last 72 hours. Thyroid Function Tests: No results for input(s): TSH, T4TOTAL, FREET4, T3FREE, THYROIDAB in the last 72 hours. Anemia Panel: No results for input(s): VITAMINB12, FOLATE, FERRITIN, TIBC,  IRON, RETICCTPCT in the last 72 hours. Urine analysis:    Component Value Date/Time   COLORURINE YELLOW 12/28/2010 0813   APPEARANCEUR CLEAR 12/28/2010 0813   LABSPEC 1.025 12/28/2010 0813   PHURINE 5.0 12/28/2010 0813   GLUCOSEU >1000 (A) 12/28/2010 0813   HGBUR TRACE (A) 12/28/2010 0813   BILIRUBINUR NEGATIVE 12/28/2010 0813   KETONESUR NEGATIVE 12/28/2010 0813   PROTEINUR 100 (A) 12/28/2010 0813   UROBILINOGEN 0.2 12/28/2010 0813   NITRITE NEGATIVE 12/28/2010 0813   LEUKOCYTESUR NEGATIVE 12/28/2010 0813   Sepsis Labs: @LABRCNTIP (procalcitonin:4,lacticidven:4) )No results found for this or any previous visit (from the past 240 hour(s)).   Radiological Exams on Admission: Dg Hand Complete Left  Result Date: 01/30/2017 CLINICAL DATA:  Had a splinter at the base of the left thumb 1 week ago. Persistent pain and swelling in this region. Concern for persistent foreign body. EXAM: LEFT HAND - COMPLETE 3+ VIEW COMPARISON:  None. FINDINGS: No radiopaque foreign bodies are seen. Note that a wood splinter is often not visible on radiograph.  There is no evidence of osseous disruption. Visualized joint spaces are grossly preserved. Subcortical cysts are noted at the distal aspect of the second metacarpal, distal aspect of the third proximal phalanx, and distal aspect of the fourth middle phalanx. The carpal rows appear grossly intact, and demonstrate normal alignment. Mild soft tissue swelling is suggested about the wrist and at the base of the thumb. IMPRESSION: 1. No evidence of osseous disruption. 2. No radiopaque foreign body seen. Note that a wood splinter is often not visible on radiograph. Electronically Signed   By: Garald Balding M.D.   On: 01/30/2017 01:29    EKG: Not performed.   Assessment/Plan  1. Cellulitis of left hand  - Pt reports pulling a wood splinter out of webspace between 1st and 2nd digits of left hand 1 wk ago, and has since experienced increasing pain, swelling, and  redness with propagation proximally up the medial forearm  - No fevers or chills, no purulence  - Radiographs negative; WBC 19,800 on admission  - Treated with clindamycin in ED in setting of penicillin allergy; Tdap updated in ED   - Plan to continue clindamycin, check lactate, repeat CBC in am, elevate arm    2. Type II DM  - Remote A1c 12.3%  - Has gone untreated for years d/t lack of insurance; now has insurance  - Serum glucose is 422 on arrival with slightly low bicarb and normal AG  - Treated in ED with 7 units IV Novolog and 1 liter NS  - Plan to check CBG's and manage with sq Novolog per sliding-scale  - Consult with diabetes coordinator   3. Hyponatremia  - Serum sodium is 130 on admission - Likely secondary to marked hyperglycemia and hypovolemia  - Given a liter NS in ED and continued on NS infusion  - Repeat chem panel in am    4. Prostate cancer  - Reports being diagnosed with biopsies a year ago  - He recently obtained health insurance and reports follow-up scheduled for February 2019   5. Hypertension  - DBP elevated to 100 in ED, an isolated reading  - Has documented hx of HTN, but was without insurance until recently and not on any medications  - Monitor for now, consider starting antihypertensive such as lisinopril given DM    DVT prophylaxis: Lovenox Code Status: Full  Family Communication: Discussed with patient Disposition Plan: Observe on med-surg Consults called: None Admission status: Observation    Vianne Bulls, MD [facility] Pager [phone]  If 7PM-7AM, please contact night-coverage [email or web address] Password [facility]  01/30/2017, 2:34 AM

## 2017-01-30 NOTE — ED Triage Notes (Signed)
Pt c/o pain to left hand; pt states he has a splinter and has been trying to get it out x 1 week; pt has redness and swelling to hand with redness going up wrist

## 2017-01-30 NOTE — ED Provider Notes (Signed)
[facility] EMERGENCY DEPARTMENT Provider Note   CSN: [id number] Arrival date & time: 01/29/17  2340  Time seen 12:05 AM   History   Chief Complaint Chief Complaint  Patient presents with  . Foreign Body in Skin    HPI Grant Yang is a 61 y.o. male.  HPI patient states he was loading wood about a week ago without wearing gloves and he had a splinter at the MCP joint of his left thumb on the volar aspect.  He states he pulled it out and then he took a tooth pack and was picking up pieces of wood.  He states everything was fine until 2 days ago when he started getting some swelling in the same area without pain.  He states today it started getting red.  He then took a regular pin and his pocket knife and was trying to see if there was more foreign body left in the wound.  He denies fever or chills.  He states he has pain going up his forearm on the radial aspect.  He also states he noted some redness going up his arm.  Patient states his last tetanus was over 10 years ago.  Patient has had diabetes and states he had not had a doctor in 10 years.  He has been getting leftover metformin from friends and family.  This is been on a hit or miss basis for the last 10 years.  He states the last time he had any pills was a month ago.  He states now he has insurance and he is going to get a doctor.  PCP none  Past Medical History:  Diagnosis Date  . Diabetes mellitus   . GERD (gastroesophageal reflux disease)   . Hypertension   . Shortness of breath    patient states it hurts to breath  . Stroke [facility])    "mini stroke" patient cant remember when    Patient Active Problem List   Diagnosis Date Noted  . Prostate cancer ([place]) 01/30/2017  . Cellulitis of left hand 01/30/2017  . Hyperbilirubinemia 12/30/2010  . Hyponatremia 12/29/2010  . DM (diabetes mellitus), type 2, uncontrolled ([place]) 12/28/2010  . Acute cholecystitis 12/28/2010  . Noncompliance 12/28/2010    Past Surgical  History:  Procedure Laterality Date  . APPENDECTOMY    . CHOLECYSTECTOMY  12/30/2010   Procedure: LAPAROSCOPIC CHOLECYSTECTOMY;  Surgeon: Jamesetta So;  Location: [facility];  Service: General;  Laterality: N/A;       Home Medications    Prior to Admission medications   Medication Sig Start Date End Date Taking? Authorizing Provider  calcium carbonate (TUMS - DOSED IN MG ELEMENTAL CALCIUM) 500 MG chewable tablet Chew 2 tablets by mouth daily. For indigestion     [provider]  metFORMIN (GLUCOPHAGE) 500 MG tablet Take 1 tablet (500 mg total) by mouth 2 (two) times daily with a meal. 12/31/10 12/31/11  Aviva Signs, MD  naproxen sodium (ANAPROX) 220 MG tablet Take 220 mg by mouth every 8 (eight) hours as needed. For pain     [provider]    Family History History reviewed. No pertinent family history.  Social History Social History   Tobacco Use  . Smoking status: Former Research scientist (life sciences)  . Smokeless tobacco: Never Used  Substance Use Topics  . Alcohol use: No  . Drug use: No  states he is retired Quit smoking 10 years ago Drank alcohol today and over the weekend   Allergies   Penicillins  Review of Systems Review of Systems  All other systems reviewed and are negative.    Physical Exam Updated Vital Signs BP (!) 146/97 (BP Location: Left Arm)   Pulse 95   Temp 98.7 F (37.1 C) (Oral)   Resp 16   Ht 5\' 11"  (1.803 m)   Wt 89.8 kg (198 lb)   SpO2 96%   BMI 27.62 kg/m   Vital signs normal    Physical Exam  Constitutional: He appears well-developed and well-nourished. No distress.  HENT:  Head: Normocephalic and atraumatic.  Right Ear: External ear normal.  Left Ear: External ear normal.  Nose: Nose normal.  Eyes: EOM are normal.  Neck: Normal range of motion.  Cardiovascular: Normal rate.  Pulmonary/Chest: Effort normal. No respiratory distress.  Musculoskeletal: He exhibits edema and tenderness.  Patient is noted to have diffuse  redness and swelling of his left hand centered around the MCP joint of the left thumb with faint red streaking running up the left forearm about mid way up on the radial aspect.  He has some nontender left axillary lymphadenopathy.  When I palpate the MCP joint area I do not feel an obvious foreign body or see evidence of a foreign body.  Skin: Skin is warm. There is erythema.    Left forearm/hand    Left hand/forearm      ED Treatments / Results  Labs (all labs ordered are listed, but only abnormal results are displayed) Results for orders placed or performed during the hospital encounter of 01/29/17  Comprehensive metabolic panel  Result Value Ref Range   Sodium 130 (L) 135 - 145 mmol/L   Potassium 3.9 3.5 - 5.1 mmol/L   Chloride 99 (L) 101 - 111 mmol/L   CO2 21 (L) 22 - 32 mmol/L   Glucose, Bld 422 (H) 65 - 99 mg/dL   BUN 14 6 - 20 mg/dL   Creatinine, Ser 0.80 0.61 - 1.24 mg/dL   Calcium 9.3 8.9 - 10.3 mg/dL   Total Protein 7.5 6.5 - 8.1 g/dL   Albumin 4.0 3.5 - 5.0 g/dL   AST 16 15 - 41 U/L   ALT 17 17 - 63 U/L   Alkaline Phosphatase 85 38 - 126 U/L   Total Bilirubin 1.6 (H) 0.3 - 1.2 mg/dL   GFR calc non Af Amer >60 >60 mL/min   GFR calc Af Amer >60 >60 mL/min   Anion gap 10 5 - 15  CBC with Differential  Result Value Ref Range   WBC 19.8 (H) 4.0 - 10.5 K/uL   RBC 5.36 4.22 - 5.81 MIL/uL   Hemoglobin 16.2 13.0 - 17.0 g/dL   HCT 47.9 39.0 - 52.0 %   MCV 89.4 78.0 - 100.0 fL   MCH 30.2 26.0 - 34.0 pg   MCHC 33.8 30.0 - 36.0 g/dL   RDW 13.1 11.5 - 15.5 %   Platelets 273 150 - 400 K/uL   Neutrophils Relative % 79 %   Neutro Abs 15.6 (H) 1.7 - 7.7 K/uL   Lymphocytes Relative 10 %   Lymphs Abs 2.0 0.7 - 4.0 K/uL   Monocytes Relative 9 %   Monocytes Absolute 1.8 (H) 0.1 - 1.0 K/uL   Eosinophils Relative 2 %   Eosinophils Absolute 0.3 0.0 - 0.7 K/uL   Basophils Relative 0 %   Basophils Absolute 0.1 0.0 - 0.1 K/uL  Lactic acid, plasma  Result Value Ref Range    Lactic Acid, Venous 1.7 0.5 - 1.9  mmol/L   Laboratory interpretation all normal except hyperglycemia with mildly low bicarb but normal anion gap, leukocytosis    EKG  EKG Interpretation None       Radiology Dg Hand Complete Left  Result Date: 01/30/2017 CLINICAL DATA:  Had a splinter at the base of the left thumb 1 week ago. Persistent pain and swelling in this region. Concern for persistent foreign body. EXAM: LEFT HAND - COMPLETE 3+ VIEW COMPARISON:  None. FINDINGS: No radiopaque foreign bodies are seen. Note that a wood splinter is often not visible on radiograph. There is no evidence of osseous disruption. Visualized joint spaces are grossly preserved. Subcortical cysts are noted at the distal aspect of the second metacarpal, distal aspect of the third proximal phalanx, and distal aspect of the fourth middle phalanx. The carpal rows appear grossly intact, and demonstrate normal alignment. Mild soft tissue swelling is suggested about the wrist and at the base of the thumb. IMPRESSION: 1. No evidence of osseous disruption. 2. No radiopaque foreign body seen. Note that a wood splinter is often not visible on radiograph. Electronically Signed   By: Garald Balding M.D.   On: 01/30/2017 01:29    Procedures Procedures (including critical care time)  Medications Ordered in ED Medications  clindamycin (CLEOCIN) IVPB 600 mg (0 mg Intravenous Stopped 01/30/17 0106)  Tdap (BOOSTRIX) injection 0.5 mL (0.5 mLs Intramuscular Given 01/30/17 0039)  insulin aspart (novoLOG) injection 7 Units (7 Units Intravenous Given 01/30/17 0153)  sodium chloride 0.9 % bolus 1,000 mL (0 mLs Intravenous Stopped 01/30/17 0209)     Initial Impression / Assessment and Plan / ED Course  I have reviewed the triage vital signs and the nursing notes.  Pertinent labs & imaging results that were available during my care of the patient were reviewed by me and considered in my medical decision making (see chart for  details).    X-ray was obtained, laboratory testing was done, IV was inserted and due to penicillin allergy he was given IV clindamycin.  After reviewing patient's laboratory testing he was given a liter fluid and insulin 7 units IV.  I discussed his test results with him.  He appears to be close to going into DKA.  His bicarb is starting to get low.  We discussed admission and he is agreeable.  I will talk to the hospitalist.  02:17 AM Dr Myna Hidalgo, hospitalist will admit  Final Clinical Impressions(s) / ED Diagnoses   Final diagnoses:  Cellulitis of left upper extremity  Lymphadenitis  Hyperglycemia    Plan admission  Rolland Porter, MD, Barbette Or, MD 01/30/17 [phone]

## 2017-01-31 DIAGNOSIS — L03114 Cellulitis of left upper limb: Secondary | ICD-10-CM

## 2017-01-31 DIAGNOSIS — I889 Nonspecific lymphadenitis, unspecified: Secondary | ICD-10-CM

## 2017-01-31 LAB — COMPREHENSIVE METABOLIC PANEL
ALT: 13 U/L — ABNORMAL LOW (ref 17–63)
ANION GAP: 5 (ref 5–15)
AST: 12 U/L — ABNORMAL LOW (ref 15–41)
Albumin: 3.1 g/dL — ABNORMAL LOW (ref 3.5–5.0)
Alkaline Phosphatase: 62 U/L (ref 38–126)
BILIRUBIN TOTAL: 1.4 mg/dL — AB (ref 0.3–1.2)
BUN: 10 mg/dL (ref 6–20)
CO2: 23 mmol/L (ref 22–32)
Calcium: 8.4 mg/dL — ABNORMAL LOW (ref 8.9–10.3)
Chloride: 107 mmol/L (ref 101–111)
Creatinine, Ser: 0.63 mg/dL (ref 0.61–1.24)
Glucose, Bld: 192 mg/dL — ABNORMAL HIGH (ref 65–99)
POTASSIUM: 3.8 mmol/L (ref 3.5–5.1)
Sodium: 135 mmol/L (ref 135–145)
TOTAL PROTEIN: 6 g/dL — AB (ref 6.5–8.1)

## 2017-01-31 LAB — CBC
HCT: 42.3 % (ref 39.0–52.0)
Hemoglobin: 13.8 g/dL (ref 13.0–17.0)
MCH: 29.5 pg (ref 26.0–34.0)
MCHC: 32.6 g/dL (ref 30.0–36.0)
MCV: 90.4 fL (ref 78.0–100.0)
PLATELETS: 254 10*3/uL (ref 150–400)
RBC: 4.68 MIL/uL (ref 4.22–5.81)
RDW: 13 % (ref 11.5–15.5)
WBC: 13.7 10*3/uL — ABNORMAL HIGH (ref 4.0–10.5)

## 2017-01-31 LAB — GLUCOSE, CAPILLARY
GLUCOSE-CAPILLARY: 220 mg/dL — AB (ref 65–99)
GLUCOSE-CAPILLARY: 173 mg/dL — AB (ref 65–99)
GLUCOSE-CAPILLARY: 202 mg/dL — AB (ref 65–99)
Glucose-Capillary: 209 mg/dL — ABNORMAL HIGH (ref 65–99)

## 2017-01-31 LAB — HIV ANTIBODY (ROUTINE TESTING W REFLEX): HIV Screen 4th Generation wRfx: NONREACTIVE

## 2017-01-31 LAB — SURGICAL PCR SCREEN
MRSA, PCR: NEGATIVE
STAPHYLOCOCCUS AUREUS: NEGATIVE

## 2017-01-31 MED ORDER — INSULIN ASPART 100 UNIT/ML ~~LOC~~ SOLN
3.0000 [IU] | Freq: Three times a day (TID) | SUBCUTANEOUS | Status: DC
  Administered 2017-01-31 – 2017-02-03 (×9): 3 [IU] via SUBCUTANEOUS

## 2017-01-31 MED ORDER — POVIDONE-IODINE 10 % EX SWAB
2.0000 "application " | Freq: Once | CUTANEOUS | Status: AC
  Administered 2017-02-01: 2 via TOPICAL

## 2017-01-31 MED ORDER — CHLORHEXIDINE GLUCONATE 4 % EX LIQD
60.0000 mL | Freq: Once | CUTANEOUS | Status: AC
  Administered 2017-02-01: 4 via TOPICAL
  Filled 2017-01-31 (×2): qty 15
  Filled 2017-01-31: qty 30
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
## 2017-01-31 NOTE — Progress Notes (Signed)
Inpatient Diabetes Program Recommendations  AACE/ADA: New Consensus Statement on Inpatient Glycemic Control (2015)  Target Ranges:  Prepandial:   less than 140 mg/dL      Peak postprandial:   less than 180 mg/dL (1-2 hours)      Critically ill patients:  140 - 180 mg/dL  Results for Grant Yang, Grant Yang (MRN [id number]) as of 01/31/2017 06:58  Ref. Range 01/31/2017 04:42  Glucose Latest Ref Range: 65 - 99 mg/dL 192 (H)   Results for Grant Yang, Grant Yang (MRN [id number]) as of 01/31/2017 06:58  Ref. Range 01/30/2017 08:17 01/30/2017 12:04 01/30/2017 17:05 01/30/2017 21:38  Glucose-Capillary Latest Ref Range: 65 - 99 mg/dL 247 (H) 282 (H) 175 (H) 198 (H)   Review of Glycemic Control  Current orders for Inpatient glycemic control: Lantus 10 units daily, Novolog 0-15 units TID with meals, Novolog 0-5 units QHS  Inpatient Diabetes Program Recommendations: Insulin - Basal: Please consider increasing Lantus to 13 units daily. Insulin - Meal Coverage: Please consider ordering Novolog 3 units TID with meals for meal coverage if patient eats at least 50% of meals. HgbA1C: A1C 10.4% on 01/30/17 indicating an average glucose of 252 mg/dl over the past 2-3 months.  NOTE: Patient's insurance he recently purchased will not go into effect until 02/20/17. Therefore, patient will need affordable DM medications at time of discharge. If patient will discharge on insulin, recommend prescribing NOVOLIN 70/30; based on current recommendations would ask for NOVOLIN 70/30 10 units BID (which will provide a total of 14 units for basal and 6 units for meal coverage per day).  Thanks, Barnie Alderman, RN, MSN, CDE Diabetes Coordinator Inpatient Diabetes Program [phone] (Team Pager from 8am to 5pm)  '

## 2017-01-31 NOTE — Care Management Note (Signed)
Case Management Note  Patient Details  Name: UEL DAVIDOW MRN: [id number] Date of Birth: 03-09-1955     Admitted with abscess to hand. Pt from home, lives alone, is ind with ADL's. He has no PCP, no insurance, has transportation. Pt has signed up for insurance that will begin in January. Pt will DC on insulin. Pt given MATCH voucher and list of PCP's accepting pt's. Pt encouraged to contact MD offices while sitting at hospital to schedule an appointment to establish care. He understands he will need a PCP to cont to receive prescription for insulin. Pt plans to see urologist about prostate cancer that was dx January of 2017 after his insurance begins. No further CM needs noted. CM may be consulted if needs arise.                 Expected Discharge Date:  01/31/17               Expected Discharge Plan:  Home/Self Care  In-House Referral:  Financial Counselor  Discharge planning Services  CM Consult, [facility], Other - See comment  Post Acute Care Choice:  NA Choice offered to:  NA  Status of Service:  Completed, signed off  Sherald Barge, RN 01/31/2017, 3:25 PM

## 2017-01-31 NOTE — Progress Notes (Signed)
PROGRESS NOTE    Grant Yang  UMP:[id number] DOB: [birth year] DOA: 01/29/2017 PCP: Patient, No Pcp Per    Brief Narrative:  61 year old male with a history of diabetes mellitus, prostate cancer, and hypertension presenting with 2-day history of increasing pain, edema, and erythema of his left hand.  The patient states that while moving wood, I would splinter lodged in the webspace between his left first and second digits approximately 2 weeks prior to admission.  Around January 26, 2017, he noticed increasing erythema, edema, and pain on his left hand at the base of his left thumb.  He tried to pick the splinter out with a toothpick, so needle, and utility knife and ultimately was able to get the splinter out on January 27, 2017.  However he continued to have increasing pain, edema, and streaking erythema proximally to the wrist.  As a result, the patient presented for further evaluation.  He denied any fevers, chills, chest pain, shortness breath, nausea, vomiting, diarrhea, abdominal pain.  In the emergency department, the patient was started on clindamycin IV.     Assessment & Plan:   Principal Problem:   Cellulitis of left hand Active Problems:   DM (diabetes mellitus), type 2, uncontrolled ([place])   Noncompliance   Hyponatremia   Prostate cancer ([place])   Hypertension   Uncontrolled type 2 diabetes mellitus with hyperglycemia ([facility])   Cellulitis Left Hand -Continue vancomycin -ESR 17 -CRP 6.6 -X-ray left hand negative for foreign body or fracture Exline-MRI showing small abscess -Orthopedic consulted -Patient scheduled for procedure in OR tomorrow  Diabetes mellitus type 2, uncontrolled -Hemoglobin A1c of 10.4. -Patient has not seen a physician for nearly 6 years -He has been taking his friend's metformin intermittently -Increase Lantus to 13 units daily -Continue NovoLog sliding scale -Order NovoLog 3 units 3 times daily with meals  Hyponatremia -Secondary to  hyperglycemia -Sodium of 135 this morning  Hyperbilirubinemia -Elevated indirect bilirubin  Prostate cancer -Outpatient follow-up      DVT prophylaxis: Lovenox  code Status: Full code Family Communication: No family bedside Disposition Plan: Pending postop course   Consultants:   Orthopedic surgery-Dr. Aline Brochure  Procedures:   None  Antimicrobials:   Vancomycin   Subjective: Patient seen and evaluated.  Voices that his hand is not painful but the swelling seems to have been stabilized from yesterday.  Still having some pain with attempting to close a fist.  States he is getting a surgical procedure tomorrow.  Objective: Vitals:   01/30/17 1754 01/30/17 2136 01/31/17 0521 01/31/17 0700  BP: 134/82 126/76 125/78 125/78  Pulse: 78 78 75 72  Resp: 16 18 20 18   Temp: 97.8 F (36.6 C) 98.4 F (36.9 C) 98.4 F (36.9 C) 98.4 F (36.9 C)  TempSrc:  Oral Oral Oral  SpO2: 98% 100% 100% 97%  Weight:      Height:        Intake/Output Summary (Last 24 hours) at 01/31/2017 1257 Last data filed at 01/31/2017 4008 Gross per 24 hour  Intake 1820 ml  Output -  Net 1820 ml   Filed Weights   01/30/17 0005 01/30/17 0609  Weight: 89.8 kg (198 lb) 87.6 kg (193 lb 3.2 oz)    Examination:  General exam: Appears calm and comfortable  Respiratory system: Clear to auscultation. Respiratory effort normal. Cardiovascular system: S1 & S2 heard, RRR. No JVD, murmurs, rubs, gallops or clicks. No pedal edema. Gastrointestinal system: Abdomen is nondistended, soft and nontender. No organomegaly or masses felt.  Normal bowel sounds heard. Central nervous system: Alert and oriented. No focal neurological deficits. Extremities: Symmetric 5 x 5 power.  Left hand with swelling and erythema of the thenar eminence see pictures     Skin: Erythema of thenar eminence on left hand, otherwise warm, dry, intact Psychiatry: Judgement and insight appear normal. Mood & affect appropriate.       Data Reviewed: I have personally reviewed following labs and imaging studies  CBC: Recent Labs  Lab 01/30/17 0031 01/30/17 0508 01/31/17 0442  WBC 19.8* 16.5* 13.7*  NEUTROABS 15.6* 12.5*  --   HGB 16.2 15.3 13.8  HCT 47.9 46.0 42.3  MCV 89.4 90.0 90.4  PLT 273 262 341   Basic Metabolic Panel: Recent Labs  Lab 01/30/17 0031 01/30/17 0508 01/31/17 0442  NA 130* 134* 135  K 3.9 3.7 3.8  CL 99* 103 107  CO2 21* 21* 23  GLUCOSE 422* 273* 192*  BUN 14 12 10   CREATININE 0.80 0.72 0.63  CALCIUM 9.3 8.6* 8.4*   GFR: Estimated Creatinine Clearance: 105.9 mL/min (by C-G formula based on SCr of 0.63 mg/dL). Liver Function Tests: Recent Labs  Lab 01/30/17 0031 01/30/17 1347 01/31/17 0442  AST 16  --  12*  ALT 17  --  13*  ALKPHOS 85  --  62  BILITOT 1.6* 1.5* 1.4*  PROT 7.5  --  6.0*  ALBUMIN 4.0  --  3.1*   No results for input(s): LIPASE, AMYLASE in the last 168 hours. No results for input(s): AMMONIA in the last 168 hours. Coagulation Profile: No results for input(s): INR, PROTIME in the last 168 hours. Cardiac Enzymes: No results for input(s): CKTOTAL, CKMB, CKMBINDEX, TROPONINI in the last 168 hours. BNP (last 3 results) No results for input(s): PROBNP in the last 8760 hours. HbA1C: Recent Labs    01/30/17 0031  HGBA1C 10.4*   CBG: Recent Labs  Lab 01/30/17 1204 01/30/17 1705 01/30/17 2138 01/31/17 0728 01/31/17 1104  GLUCAP 282* 175* 198* 220* 209*   Lipid Profile: No results for input(s): CHOL, HDL, LDLCALC, TRIG, CHOLHDL, LDLDIRECT in the last 72 hours. Thyroid Function Tests: No results for input(s): TSH, T4TOTAL, FREET4, T3FREE, THYROIDAB in the last 72 hours. Anemia Panel: No results for input(s): VITAMINB12, FOLATE, FERRITIN, TIBC, IRON, RETICCTPCT in the last 72 hours. Sepsis Labs: Recent Labs  Lab 01/30/17 0155 01/30/17 0508 01/30/17 0839  LATICACIDVEN 1.7 1.2 1.5    No results found for this or any previous visit (from the  past 240 hour(s)).       Radiology Studies: Mr Hand Left W Wo Contrast  Result Date: 01/30/2017 CLINICAL DATA:  Redness and swelling about the left hand which is worst at the thumb since the patient removed wooden splinter 2 weeks ago. Diabetic patient. EXAM: MRI OF THE LEFT HAND WITHOUT AND WITH CONTRAST TECHNIQUE: Multiplanar, multisequence MR imaging of the left hand was performed before and after the administration of intravenous contrast. CONTRAST:  18 ml MULTIHANCE GADOBENATE DIMEGLUMINE 529 MG/ML IV SOLN COMPARISON:  Plain films left hand 01/30/2017. FINDINGS: Bones/Joint/Cartilage There is no bone marrow edema or enhancement to suggest osteomyelitis. Well-circumscribed T2 hyperintense lesions in the heads of the third and fourth metacarpals are compatible with cysts or enchondromas. Tiny degenerative cysts in the heads of the first and second metacarpals are also noted. Ligaments Intact. Muscles and Tendons There is mild edema and enhancement in the muscles of the thenar eminence consistent with myositis. No intramuscular abscess is identified. Soft tissues  Subcutaneous edema and enhancement are seen centered about the first MCP joint. In the subcutaneous fatty tissues on the volar surface of the thumb eccentric toward the radial side, a fluid collection measuring 1.7 cm transverse by 0.7 cm AP by 1.3 cm craniocaudal is consistent with an abscess. IMPRESSION: Cellulitis centered at the thenar eminence with a small abscess in the subcutaneous fatty tissues along the volar margin of the first MCP joint on the radial side. Mild edema and enhancement in the musculature of the thenar eminence consistent with myositis. No intramuscular abscess. Negative for osteomyelitis or septic joint. Electronically Signed   By: Inge Rise M.D.   On: 01/30/2017 15:45   Dg Hand Complete Left  Result Date: 01/30/2017 CLINICAL DATA:  Had a splinter at the base of the left thumb 1 week ago. Persistent pain and  swelling in this region. Concern for persistent foreign body. EXAM: LEFT HAND - COMPLETE 3+ VIEW COMPARISON:  None. FINDINGS: No radiopaque foreign bodies are seen. Note that a wood splinter is often not visible on radiograph. There is no evidence of osseous disruption. Visualized joint spaces are grossly preserved. Subcortical cysts are noted at the distal aspect of the second metacarpal, distal aspect of the third proximal phalanx, and distal aspect of the fourth middle phalanx. The carpal rows appear grossly intact, and demonstrate normal alignment. Mild soft tissue swelling is suggested about the wrist and at the base of the thumb. IMPRESSION: 1. No evidence of osseous disruption. 2. No radiopaque foreign body seen. Note that a wood splinter is often not visible on radiograph. Electronically Signed   By: Garald Balding M.D.   On: 01/30/2017 01:29        Scheduled Meds: . enoxaparin (LOVENOX) injection  40 mg Subcutaneous Q24H  . insulin aspart  0-15 Units Subcutaneous TID WC  . insulin aspart  0-5 Units Subcutaneous QHS  . insulin glargine  10 Units Subcutaneous Daily   Continuous Infusions: . vancomycin Stopped (01/31/17 2595)     LOS: 0 days    Time spent: 30 minutes    Loretha Stapler, MD [facility] Pager [phone]  If 7PM-7AM, please contact night-coverage [email or web address] Password [facility] 01/31/2017, 12:57 PM
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
## 2017-02-01 ENCOUNTER — Encounter (HOSPITAL_COMMUNITY)
Admission: EM | Disposition: A | Discharge status: Home / Self Care | Payer: Self-pay | Source: Home / Self Care | Attending: Family Medicine

## 2017-02-01 ENCOUNTER — Encounter (HOSPITAL_COMMUNITY): Payer: Self-pay

## 2017-02-01 ENCOUNTER — Inpatient Hospital Stay (HOSPITAL_COMMUNITY): Payer: Self-pay | Admitting: Anesthesiology

## 2017-02-01 HISTORY — PX: INCISION AND DRAINAGE ABSCESS: SHX5864

## 2017-02-01 LAB — GLUCOSE, CAPILLARY
GLUCOSE-CAPILLARY: 202 mg/dL — AB (ref 65–99)
GLUCOSE-CAPILLARY: 139 mg/dL — AB (ref 65–99)
GLUCOSE-CAPILLARY: 181 mg/dL — AB (ref 65–99)
Glucose-Capillary: 111 mg/dL — ABNORMAL HIGH (ref 65–99)
Glucose-Capillary: 86 mg/dL (ref 65–99)
Glucose-Capillary: 133 mg/dL — ABNORMAL HIGH (ref 65–99)

## 2017-02-01 SURGERY — INCISION AND DRAINAGE, ABSCESS
Anesthesia: General | Laterality: Left

## 2017-02-01 MED ORDER — MIDAZOLAM HCL 2 MG/2ML IJ SOLN
INTRAMUSCULAR | Status: AC
  Filled 2017-02-01: qty 2

## 2017-02-01 MED ORDER — LIDOCAINE HCL (PF) 1 % IJ SOLN
INTRAMUSCULAR | Status: AC
  Filled 2017-02-01: qty 5

## 2017-02-01 MED ORDER — INSULIN GLARGINE 100 UNIT/ML ~~LOC~~ SOLN
13.0000 [IU] | Freq: Every day | SUBCUTANEOUS | Status: DC
  Administered 2017-02-02 – 2017-02-03 (×2): 13 [IU] via SUBCUTANEOUS
  Filled 2017-02-01 (×4): qty 0.13

## 2017-02-01 MED ORDER — FENTANYL CITRATE (PF) 100 MCG/2ML IJ SOLN
INTRAMUSCULAR | Status: AC
  Filled 2017-02-01: qty 2

## 2017-02-01 MED ORDER — MIDAZOLAM HCL 2 MG/2ML IJ SOLN
INTRAMUSCULAR | Status: DC | PRN
Start: 2017-02-01 — End: 2017-02-01
  Administered 2017-02-01: 2 mg via INTRAVENOUS

## 2017-02-01 MED ORDER — FENTANYL CITRATE (PF) 100 MCG/2ML IJ SOLN
INTRAMUSCULAR | Status: DC | PRN
  Administered 2017-02-01 (×2): 25 ug via INTRAVENOUS
  Administered 2017-02-01: 50 ug via INTRAVENOUS

## 2017-02-01 MED ORDER — SODIUM CHLORIDE 0.9 % IR SOLN
Status: DC | PRN
  Administered 2017-02-01: 1000 mL

## 2017-02-01 MED ORDER — FENTANYL CITRATE (PF) 100 MCG/2ML IJ SOLN: 25.0000 ug | INTRAMUSCULAR | Status: DC | PRN

## 2017-02-01 MED ORDER — LIDOCAINE HCL (CARDIAC) 10 MG/ML IV SOLN
INTRAVENOUS | Status: DC | PRN
  Administered 2017-02-01: 30 mg via INTRAVENOUS

## 2017-02-01 MED ORDER — LACTATED RINGERS IV SOLN
INTRAVENOUS | Status: DC
  Administered 2017-02-01: 14:00:00 via INTRAVENOUS

## 2017-02-01 MED ORDER — PROPOFOL 10 MG/ML IV BOLUS
INTRAVENOUS | Status: AC
  Filled 2017-02-01: qty 20

## 2017-02-01 MED ORDER — ONDANSETRON HCL 4 MG/2ML IJ SOLN
INTRAMUSCULAR | Status: AC
  Filled 2017-02-01: qty 2

## 2017-02-01 MED ORDER — PROPOFOL 10 MG/ML IV BOLUS
INTRAVENOUS | Status: DC | PRN
  Administered 2017-02-01: 150 mg via INTRAVENOUS

## 2017-02-01 MED ORDER — MIDAZOLAM HCL 2 MG/2ML IJ SOLN
1.0000 mg | INTRAMUSCULAR | Status: DC
  Administered 2017-02-01: 1 mg via INTRAVENOUS

## 2017-02-01 SURGICAL SUPPLY — 27 items
BAG HAMPER (MISCELLANEOUS) ×2 IMPLANT
BANDAGE ELASTIC 4 LF NS (GAUZE/BANDAGES/DRESSINGS) ×2 IMPLANT
BANDAGE ESMARK 4X12 BL STRL LF (DISPOSABLE) ×1 IMPLANT
BNDG ESMARK 4X12 BLUE STRL LF (DISPOSABLE) ×2
BNDG GAUZE ELAST 4 BULKY (GAUZE/BANDAGES/DRESSINGS) ×2 IMPLANT
CLOTH BEACON ORANGE TIMEOUT ST (SAFETY) ×2 IMPLANT
COVER LIGHT HANDLE STERIS (MISCELLANEOUS) ×4 IMPLANT
ELECT REM PT RETURN 9FT ADLT (ELECTROSURGICAL) ×2
ELECTRODE REM PT RTRN 9FT ADLT (ELECTROSURGICAL) ×1 IMPLANT
GAUZE IODOFORM PACK 1/2 7832 (GAUZE/BANDAGES/DRESSINGS) ×2 IMPLANT
GAUZE SPONGE 4X4 12PLY STRL (GAUZE/BANDAGES/DRESSINGS) ×2 IMPLANT
GLOVE BIOGEL PI IND STRL 7.0 (GLOVE) ×1 IMPLANT
GLOVE BIOGEL PI INDICATOR 7.0 (GLOVE) ×1
GLOVE OPTIFIT SS 8.0 STRL (GLOVE) ×2 IMPLANT
GLOVE SKINSENSE NS SZ8.0 LF (GLOVE) ×1
GLOVE SKINSENSE STRL SZ8.0 LF (GLOVE) ×1 IMPLANT
GOWN STRL REUS W/TWL LRG LVL3 (GOWN DISPOSABLE) ×2 IMPLANT
GOWN STRL REUS W/TWL XL LVL3 (GOWN DISPOSABLE) ×2 IMPLANT
KIT ROOM TURNOVER APOR (KITS) ×2 IMPLANT
MANIFOLD NEPTUNE II (INSTRUMENTS) ×2 IMPLANT
NS IRRIG 1000ML POUR BTL (IV SOLUTION) ×2 IMPLANT
PACK BASIC LIMB (CUSTOM PROCEDURE TRAY) ×2 IMPLANT
PAD ARMBOARD 7.5X6 YLW CONV (MISCELLANEOUS) ×2 IMPLANT
SET BASIN LINEN APH (SET/KITS/TRAYS/PACK) ×2 IMPLANT
SWAB CULTURE LIQ STUART DBL (MISCELLANEOUS) ×2 IMPLANT
SYR BULB IRRIGATION 50ML (SYRINGE) ×2 IMPLANT
TUBE ANAEROBIC PORT A CUL  W/M (MISCELLANEOUS) ×2 IMPLANT

## 2017-02-01 NOTE — Brief Op Note (Signed)
01/29/2017 - 02/01/2017  2:48 PM  PATIENT:  Grant Yang  61 y.o. male  PRE-OPERATIVE DIAGNOSIS:  ABSCESS LEFT HAND  POST-OPERATIVE DIAGNOSIS:  ABSCESS LEFT HAND  PROCEDURE:  Procedure(s): INCISION AND DRAINAGE ABSCESS LEFT HAND (Left)  Surgical findings abscess small 1 x 1 cm subcutaneous tissue  Surgical details of procedure the patient was properly identified left hand thenar eminence marked as the site of the surgery  Patient taken to surgery for general anesthesia after successful general anesthesia the patient was prepped and draped with Betadine  Timeout was appropriately conducted  Tourniquet was elevated to 250 mmHg  A Z-shaped incision was made with 2 limbs over the the palmar crease and extended distally and proximally and gross purulence was immediately expressed cultures were obtained.  The wound was thoroughly irrigated.  We palpated the distal forearm to make sure there was no purulent drainage in the sheath.  We did this distally proximally medially laterally and once the initial pus drained there was no other purulence  The wound was packed with iodoform gauze  The tourniquet was released sterile dressing was applied  Postoperative plan dressing change tomorrow with therapy.  Packing twice a day.  Culture specific antibiotics continue vancomycin until culture identification   SURGEON:  Surgeon(s) and Role:    * Carole Civil, MD - Primary  PHYSICIAN ASSISTANT:   ASSISTANTS: none   ANESTHESIA:   general  EBL:  none   BLOOD ADMINISTERED:none  DRAINS: none   LOCAL MEDICATIONS USED:  NONE  SPECIMEN:  Source of Specimen:  Left thenar eminence  DISPOSITION OF SPECIMEN:  Microbiology  COUNTS:  YES  TOURNIQUET:   Total Tourniquet Time Documented: Upper Arm (Left) - 10 minutes Total: Upper Arm (Left) - 10 minutes   DICTATION: .Viviann Spare Dictation  PLAN OF CARE: Admit to inpatient   PATIENT DISPOSITION:  PACU - hemodynamically stable.    Delay start of Pharmacological VTE agent (>24hrs) due to surgical blood loss or risk of bleeding: no

## 2017-02-01 NOTE — Progress Notes (Signed)
PROGRESS NOTE    Grant Yang  QQP:[id number] DOB: Aug 11, 1955 DOA: 01/29/2017 PCP: Patient, No Pcp Per    Brief Narrative:  61 year old male with a history of diabetes mellitus, prostate cancer, and hypertension presenting with 2-day history of increasing pain, edema, and erythema of his left hand.  The patient states that while moving wood, I would splinter lodged in the webspace between his left first and second digits approximately 2 weeks prior to admission.  Around January 26, 2017, he noticed increasing erythema, edema, and pain on his left hand at the base of his left thumb.  He tried to pick the splinter out with a toothpick, so needle, and utility knife and ultimately was able to get the splinter out on January 27, 2017.  However he continued to have increasing pain, edema, and streaking erythema proximally to the wrist.  As a result, the patient presented for further evaluation.  He denied any fevers, chills, chest pain, shortness breath, nausea, vomiting, diarrhea, abdominal pain.  In the emergency department, the patient was started on clindamycin IV.  Patient was seen and evaluated by hand/orthopedic surgery Dr. Aline Brochure and was taken to the operating room on 02/01/2017 for I&D.  Cultures were obtained in the operating room.  He was continued on vancomycin until culture data resulted.   Assessment & Plan:   Principal Problem:   Cellulitis of left hand Active Problems:   DM (diabetes mellitus), type 2, uncontrolled ([place])   Noncompliance   Hyponatremia   Prostate cancer ([place])   Hypertension   Uncontrolled type 2 diabetes mellitus with hyperglycemia ([facility])   Cellulitis Left Hand -Continue vancomycin until cultures from operating room result -ESR 17 -CRP 6.6 -X-ray left hand negative for foreign body or fracture -MRI showing small abscess -Patient to undergo I&D today in the operating room with Dr. Aline Brochure  Diabetes mellitus type 2, uncontrolled -Hemoglobin A1c of  10.4. -Patient has not seen a physician for nearly 6 years -He has been taking his friend's metformin intermittently -Continue Lantus to 13 units daily -Continue NovoLog sliding scale -Order NovoLog 3 units 3 times daily with meals  Hyponatremia -We will repeat BMP in the morning  Hyperbilirubinemia -Repeat CMP in the morning  Prostate cancer -Outpatient follow-up      DVT prophylaxis: Lovenox  code Status: Full code Family Communication: No family bedside Disposition Plan: Pending postop course   Consultants:   Orthopedic surgery-Dr. Aline Brochure  Procedures:   None  Antimicrobials:   Vancomycin   Subjective: P patient seen prior to surgery.  He is anxious to go to the OR and get his I&D.  He voices that the redness, swelling and pain in his hand has worsened overnight.  Objective: Vitals:   02/01/17 1505 02/01/17 1515 02/01/17 1530 02/01/17 1541  BP: 108/69 116/73 (!) 100/55 133/76  Pulse: 74 74 77 75  Resp: (!) 24 (!) 25 (!) 22 18  Temp: 99 F (37.2 C)   98.2 F (36.8 C)  TempSrc:      SpO2: 95% 98% 95% 97%  Weight:      Height:        Intake/Output Summary (Last 24 hours) at 02/01/2017 1554 Last data filed at 02/01/2017 1500 Gross per 24 hour  Intake 1000 ml  Output 5 ml  Net 995 ml   Filed Weights   01/30/17 0005 01/30/17 0609 02/01/17 1257  Weight: 89.8 kg (198 lb) 87.6 kg (193 lb 3.2 oz) 89.8 kg (198 lb)    Examination:  General exam: Appears calm and comfortable  Respiratory system: Clear to auscultation. Respiratory effort normal. Cardiovascular system: S1 & S2 heard, RRR. No JVD, murmurs, rubs, gallops or clicks. No pedal edema. Gastrointestinal system: Abdomen is nondistended, soft and nontender. No organomegaly or masses felt. Normal bowel sounds heard. Central nervous system: Alert and oriented. No focal neurological deficits. Extremities: Symmetric 5 x 5 power.  Left hand with swelling and erythema of the thenar eminence worse  today than previously.  Patient is still able to flex and extend as well as rotate his thumb Skin: Erythema of thenar eminence on left hand, otherwise warm, dry, intact Psychiatry: Judgement and insight appear normal. Mood & affect appropriate.     Data Reviewed: I have personally reviewed following labs and imaging studies  CBC: Recent Labs  Lab 01/30/17 0031 01/30/17 0508 01/31/17 0442  WBC 19.8* 16.5* 13.7*  NEUTROABS 15.6* 12.5*  --   HGB 16.2 15.3 13.8  HCT 47.9 46.0 42.3  MCV 89.4 90.0 90.4  PLT 273 262 093   Basic Metabolic Panel: Recent Labs  Lab 01/30/17 0031 01/30/17 0508 01/31/17 0442  NA 130* 134* 135  K 3.9 3.7 3.8  CL 99* 103 107  CO2 21* 21* 23  GLUCOSE 422* 273* 192*  BUN _0 CREATININE 0.80 0.72 0.63  CALCIUM 9.3 8.6* 8.4*   GFR: Estimated Creatinine Clearance: 105.9 mL/min (by C-G formula based on SCr of 0.63 mg/dL). Liver Function Tests: Recent Labs  Lab 01/30/17 0031 01/30/17 1347 01/31/17 0442  AST 16  --  12*  ALT 17  --  13*  ALKPHOS 85  --  62  BILITOT 1.6* 1.5* 1.4*  PROT 7.5  --  6.0*  ALBUMIN 4.0  --  3.1*   No results for input(s): LIPASE, AMYLASE in the last 168 hours. No results for input(s): AMMONIA in the last 168 hours. Coagulation Profile: No results for input(s): INR, PROTIME in the last 168 hours. Cardiac Enzymes: No results for input(s): CKTOTAL, CKMB, CKMBINDEX, TROPONINI in the last 168 hours. BNP (last 3 results) No results for input(s): PROBNP in the last 8760 hours. HbA1C: Recent Labs    01/30/17 0031  HGBA1C 10.4*   CBG: Recent Labs  Lab 01/31/17 2044 02/01/17 0736 02/01/17 1128 02/01/17 1328 02/01/17 1509  GLUCAP 202* 202* 139* 111* 86   Lipid Profile: No results for input(s): CHOL, HDL, LDLCALC, TRIG, CHOLHDL, LDLDIRECT in the last 72 hours. Thyroid Function Tests: No results for input(s): TSH, T4TOTAL, FREET4, T3FREE, THYROIDAB in the last 72 hours. Anemia Panel: No results for  input(s): VITAMINB12, FOLATE, FERRITIN, TIBC, IRON, RETICCTPCT in the last 72 hours. Sepsis Labs: Recent Labs  Lab 01/30/17 0155 01/30/17 0508 01/30/17 0839  LATICACIDVEN 1.7 1.2 1.5    Recent Results (from the past 240 hour(s))  Surgical pcr screen     Status: None   Collection Time: 01/31/17  9:00 PM  Result Value Ref Range Status   MRSA, PCR NEGATIVE NEGATIVE Final   Staphylococcus aureus NEGATIVE NEGATIVE Final    Comment: (NOTE) The Xpert SA Assay (FDA approved for NASAL specimens in patients 61 years of age and older), is one component of a comprehensive surveillance program. It is not intended to diagnose infection nor to guide or monitor treatment.          Radiology Studies: No results found.      Scheduled Meds: . insulin aspart  0-15 Units Subcutaneous TID WC  . insulin aspart  0-5  Units Subcutaneous QHS  . insulin aspart  3 Units Subcutaneous TID WC  . [START ON 02/02/2017] insulin glargine  13 Units Subcutaneous Daily   Continuous Infusions: . vancomycin Stopped (02/01/17 0803)     LOS: 1 day    Time spent: 30 minutes    Loretha Stapler, MD [facility] Pager [phone]  If 7PM-7AM, please contact night-coverage [email or web address] Password [facility] 02/01/2017, 3:54 PM

## 2017-02-01 NOTE — Transfer of Care (Signed)
Immediate Anesthesia Transfer of Care Note  Patient: Grant Yang  Procedure(s) Performed: INCISION AND DRAINAGE ABSCESS LEFT HAND (Left )  Patient Location: PACU  Anesthesia Type:General  Level of Consciousness: drowsy  Airway & Oxygen Therapy: Patient Spontanous Breathing and Patient connected to face mask oxygen  Post-op Assessment: Report given to RN and Post -op Vital signs reviewed and stable  Post vital signs: Reviewed and stable  Last Vitals:  Vitals:   02/01/17 1248 02/01/17 1340  BP: 125/81 128/74  Pulse: 80   Resp:  17  Temp: 37 C   SpO2: 97% 99%    Last Pain:  Vitals:   02/01/17 1248  TempSrc: Oral  PainSc:       Patients Stated Pain Goal: 0 (32/44/01 0272)  Complications: No apparent anesthesia complications

## 2017-02-01 NOTE — Op Note (Signed)
01/29/2017 - 02/01/2017  2:48 PM  PATIENT:  Grant Yang  61 y.o. male  PRE-OPERATIVE DIAGNOSIS:  ABSCESS LEFT HAND  POST-OPERATIVE DIAGNOSIS:  ABSCESS LEFT HAND  PROCEDURE:  Procedure(s): INCISION AND DRAINAGE ABSCESS LEFT HAND (Left)  Surgical findings abscess small 1 x 1 cm subcutaneous tissue  Surgical details of procedure the patient was properly identified left hand thenar eminence marked as the site of the surgery  Patient taken to surgery for general anesthesia after successful general anesthesia the patient was prepped and draped with Betadine  Timeout was appropriately conducted  Tourniquet was elevated to 250 mmHg  A Z-shaped incision was made with 2 limbs over the the palmar crease and extended distally and proximally and gross purulence was immediately expressed cultures were obtained.  The wound was thoroughly irrigated.  We palpated the distal forearm to make sure there was no purulent drainage in the sheath.  We did this distally proximally medially laterally and once the initial pus drained there was no other purulence  The wound was packed with iodoform gauze  The tourniquet was released sterile dressing was applied  Postoperative plan dressing change tomorrow with therapy.  Packing twice a day.  Culture specific antibiotics continue vancomycin until culture identification   SURGEON:  Surgeon(s) and Role:    * Carole Civil, MD - Primary  PHYSICIAN ASSISTANT:   ASSISTANTS: none   ANESTHESIA:   general  EBL:  none   BLOOD ADMINISTERED:none  DRAINS: none   LOCAL MEDICATIONS USED:  NONE  SPECIMEN:  Source of Specimen:  Left thenar eminence  DISPOSITION OF SPECIMEN:  Microbiology  COUNTS:  YES  TOURNIQUET:   Total Tourniquet Time Documented: Upper Arm (Left) - 10 minutes Total: Upper Arm (Left) - 10 minutes   DICTATION: .Viviann Spare Dictation  PLAN OF CARE: Admit to inpatient   PATIENT DISPOSITION:  PACU - hemodynamically stable.    Delay start of Pharmacological VTE agent (>24hrs) due to surgical blood loss or risk of bleeding: no

## 2017-02-01 NOTE — Interval H&P Note (Signed)
History and Physical Interval Note:  02/01/2017 2:09 PM  Grant Yang  has presented today for surgery, with the diagnosis of ABSCESS LEFT HAND  The various methods of treatment have been discussed with the patient and family. After consideration of risks, benefits and other options for treatment, the patient has consented to  Procedure(s): INCISION AND DRAINAGE LEFT HAND (Left) as a surgical intervention .  The patient's history has been reviewed, patient examined, no change in status, stable for surgery.  I have reviewed the patient's chart and labs.  Questions were answered to the patient's satisfaction.     Arther Abbott

## 2017-02-01 NOTE — Plan of Care (Signed)
Pt is progressing 

## 2017-02-01 NOTE — Progress Notes (Signed)
Inpatient Diabetes Program Recommendations  AACE/ADA: New Consensus Statement on Inpatient Glycemic Control (2015)  Target Ranges:  Prepandial:   less than 140 mg/dL      Peak postprandial:   less than 180 mg/dL (1-2 hours)      Critically ill patients:  140 - 180 mg/dL  Results for LAMON, ROTUNDO (MRN [id number]) as of 02/01/2017 08:29  Ref. Range 01/31/2017 07:28 01/31/2017 11:04 01/31/2017 17:04 01/31/2017 20:44 02/01/2017 07:36  Glucose-Capillary Latest Ref Range: 65 - 99 mg/dL 220 (H) 209 (H) 173 (H) 202 (H) 202 (H)   Current orders for Inpatient glycemic control: Lantus 10 units daily, Novolog 0-15 units TID with meals, Novolog 0-5 units QHS  Inpatient Diabetes Program Recommendations: Insulin - Basal: Please consider increasing Lantus to 13 units daily. HgbA1C: A1C 10.4% on 01/30/17 indicating an average glucose of 252 mg/dl over the past 2-3 months.  NOTE: Patient's insurance he recently purchased will not go into effect until 02/20/17. Therefore, patient will need affordable DM medications at time of discharge. If patient will discharge on insulin, recommend prescribing NOVOLIN 70/30; based on current recommendations would ask for NOVOLIN 70/30 10 units BID (which will provide a total of 14 units for basal and 6 units for meal coverage per day).  Thanks, Barnie Alderman, RN, MSN, CDE Diabetes Coordinator Inpatient Diabetes Program [phone] (Team Pager from 8am to 5pm)

## 2017-02-01 NOTE — Anesthesia Preprocedure Evaluation (Signed)
Anesthesia Evaluation  Patient identified by MRN, date of birth, ID band Patient awake    Reviewed: Allergy & Precautions, H&P , NPO status , Patient's Chart, lab work & pertinent test results  History of Anesthesia Complications Negative for: history of anesthetic complications  Airway Mallampati: I  TM Distance: >3 FB Neck ROM: Full    Dental no notable dental hx. (+) Poor Dentition, Dental Advisory Given, Caps   Pulmonary shortness of breath, former smoker,    Pulmonary exam normal        Cardiovascular hypertension (no meds now),  Rhythm:Regular Rate:Normal     Neuro/Psych CVA (2010), No Residual Symptoms negative psych ROS   GI/Hepatic GERD  Medicated and Controlled,  Endo/Other  diabetes, Poorly Controlled, Type 2, Insulin Dependent  Renal/GU      Musculoskeletal negative musculoskeletal ROS (+)   Abdominal Normal abdominal exam  (+)   Peds  Hematology negative hematology ROS (+)   Anesthesia Other Findings   Reproductive/Obstetrics                             Anesthesia Physical Anesthesia Plan  ASA: III  Anesthesia Plan: General   Post-op Pain Management:    Induction: Intravenous  PONV Risk Score and Plan:   Airway Management Planned: LMA  Additional Equipment:   Intra-op Plan:   Post-operative Plan: Extubation in OR  Informed Consent: I have reviewed the patients History and Physical, chart, labs and discussed the procedure including the risks, benefits and alternatives for the proposed anesthesia with the patient or authorized representative who has indicated his/her understanding and acceptance.     Plan Discussed with:   Anesthesia Plan Comments:         Anesthesia Quick Evaluation

## 2017-02-01 NOTE — Anesthesia Procedure Notes (Signed)
Procedure Name: LMA Insertion Date/Time: 02/01/2017 2:24 PM Performed by: Vista Deck, CRNA Pre-anesthesia Checklist: Patient identified, Patient being monitored, Emergency Drugs available, Timeout performed and Suction available Patient Re-evaluated:Patient Re-evaluated prior to induction Oxygen Delivery Method: Circle System Utilized Preoxygenation: Pre-oxygenation with 100% oxygen Induction Type: IV induction Ventilation: Mask ventilation without difficulty LMA: LMA inserted LMA Size: 4.0 Number of attempts: 1 Placement Confirmation: positive ETCO2 and breath sounds checked- equal and bilateral Tube secured with: Tape Dental Injury: Teeth and Oropharynx as per pre-operative assessment

## 2017-02-02 ENCOUNTER — Encounter (HOSPITAL_COMMUNITY): Payer: Self-pay | Admitting: Orthopedic Surgery

## 2017-02-02 LAB — GLUCOSE, CAPILLARY
GLUCOSE-CAPILLARY: 211 mg/dL — AB (ref 65–99)
GLUCOSE-CAPILLARY: 183 mg/dL — AB (ref 65–99)
Glucose-Capillary: 168 mg/dL — ABNORMAL HIGH (ref 65–99)

## 2017-02-02 NOTE — Progress Notes (Signed)
PROGRESS NOTE    Grant Yang  ZOX:[id number] DOB: [birth year] DOA: 01/29/2017 PCP: Patient, No Pcp Per    Brief Narrative:  61 year old male with a history of diabetes mellitus, prostate cancer, and hypertension presenting with 2-day history of increasing pain, edema, and erythema of his left hand.  The patient states that while moving wood, I would splinter lodged in the webspace between his left first and second digits approximately 2 weeks prior to admission.  Around January 26, 2017, he noticed increasing erythema, edema, and pain on his left hand at the base of his left thumb.  He tried to pick the splinter out with a toothpick, so needle, and utility knife and ultimately was able to get the splinter out on January 27, 2017.  However he continued to have increasing pain, edema, and streaking erythema proximally to the wrist.  As a result, the patient presented for further evaluation.  He denied any fevers, chills, chest pain, shortness breath, nausea, vomiting, diarrhea, abdominal pain.  In the emergency department, the patient was started on clindamycin IV.  Patient was seen and evaluated by hand/orthopedic surgery Dr. Aline Brochure and was taken to the operating room on 02/01/2017 for I&D.  Cultures were obtained in the operating room.  He was continued on vancomycin until culture data resulted.   Assessment & Plan:   Principal Problem:   Cellulitis of left hand Active Problems:   DM (diabetes mellitus), type 2, uncontrolled ([place])   Noncompliance   Hyponatremia   Prostate cancer ([place])   Hypertension   Uncontrolled type 2 diabetes mellitus with hyperglycemia ([facility])   Cellulitis Left Hand -Continue vancomycin until cultures from operating room result -ESR 17 -CRP 6.6 -X-ray left hand negative for foreign body or fracture -MRI showing small abscess -Awaiting cultures from OR as well as sensitivities  Diabetes mellitus type 2, uncontrolled -Hemoglobin A1c of 10.4. -Continue  Lantus to 13 units daily -Continue NovoLog sliding scale -Order NovoLog 3 units 3 times daily with meals  Hyponatremia -Repeat BMP in a.m.  Hyperbilirubinemia -Repeat LFTs  Prostate cancer -Outpatient follow-up      DVT prophylaxis: Lovenox  code Status: Full code Family Communication: No family bedside Disposition Plan: Hopefully be able to discharge tomorrow if culture and sensitivities from culture in OR is resulted   Consultants:   Orthopedic surgery-Dr. Aline Brochure  Procedures:   None  Antimicrobials:   Vancomycin   Subjective: Patient in good spirits this morning.  He is watching television.  He reports that his swelling of his hand has gone down dramatically.  He is hopeful to be able to go home in the next few days.  Objective: Vitals:   02/01/17 1541 02/01/17 2226 02/02/17 0629 02/02/17 1300  BP: 133/76 117/66 (!) 116/59 (!) 112/55  Pulse: 75 76 73 80  Resp: _0 Temp: 98.2 F (36.8 C) 99.1 F (37.3 C) 98.8 F (37.1 C) 98 F (36.7 C)  TempSrc:  Oral Oral Oral  SpO2: 97% 98% 96% 96%  Weight:      Height:        Intake/Output Summary (Last 24 hours) at 02/02/2017 1316 Last data filed at 02/02/2017 1256 Gross per 24 hour  Intake 2640 ml  Output 7 ml  Net 2633 ml   Filed Weights   01/30/17 0005 01/30/17 0609 02/01/17 1257  Weight: 89.8 kg (198 lb) 87.6 kg (193 lb 3.2 oz) 89.8 kg (198 lb)    Examination:  General exam: Appears calm and comfortable  Respiratory system: Clear to auscultation. Respiratory effort normal. Cardiovascular system: S1 & S2 heard, RRR. No JVD, murmurs, rubs, gallops or clicks. No pedal edema. Gastrointestinal system: Abdomen is nondistended, soft and nontender. No organomegaly or masses felt. Normal bowel sounds heard. Central nervous system: Alert and oriented. No focal neurological deficits. Extremities: Symmetric 5 x 5 power.  Unable to evaluate swelling of left hand due to surgical dressing.  Left  fingertips neurovascularly intact with a capillary refill of less than 2 seconds Skin: Warm dry and intact unable to evaluate skin of left hand and forearm due to surgical dressing  psychiatry: Judgement and insight appear normal. Mood & affect appropriate.     Data Reviewed: I have personally reviewed following labs and imaging studies  CBC: Recent Labs  Lab 01/30/17 0031 01/30/17 0508 01/31/17 0442  WBC 19.8* 16.5* 13.7*  NEUTROABS 15.6* 12.5*  --   HGB 16.2 15.3 13.8  HCT 47.9 46.0 42.3  MCV 89.4 90.0 90.4  PLT 273 262 503   Basic Metabolic Panel: Recent Labs  Lab 01/30/17 0031 01/30/17 0508 01/31/17 0442  NA 130* 134* 135  K 3.9 3.7 3.8  CL 99* 103 107  CO2 21* 21* 23  GLUCOSE 422* 273* 192*  BUN _0 CREATININE 0.80 0.72 0.63  CALCIUM 9.3 8.6* 8.4*   GFR: Estimated Creatinine Clearance: 105.9 mL/min (by C-G formula based on SCr of 0.63 mg/dL). Liver Function Tests: Recent Labs  Lab 01/30/17 0031 01/30/17 1347 01/31/17 0442  AST 16  --  12*  ALT 17  --  13*  ALKPHOS 85  --  62  BILITOT 1.6* 1.5* 1.4*  PROT 7.5  --  6.0*  ALBUMIN 4.0  --  3.1*   No results for input(s): LIPASE, AMYLASE in the last 168 hours. No results for input(s): AMMONIA in the last 168 hours. Coagulation Profile: No results for input(s): INR, PROTIME in the last 168 hours. Cardiac Enzymes: No results for input(s): CKTOTAL, CKMB, CKMBINDEX, TROPONINI in the last 168 hours. BNP (last 3 results) No results for input(s): PROBNP in the last 8760 hours. HbA1C: No results for input(s): HGBA1C in the last 72 hours. CBG: Recent Labs  Lab 02/01/17 1509 02/01/17 1638 02/01/17 2224 02/02/17 0730 02/02/17 1108  GLUCAP 86 133* 181* 168* 211*   Lipid Profile: No results for input(s): CHOL, HDL, LDLCALC, TRIG, CHOLHDL, LDLDIRECT in the last 72 hours. Thyroid Function Tests: No results for input(s): TSH, T4TOTAL, FREET4, T3FREE, THYROIDAB in the last 72 hours. Anemia Panel: No  results for input(s): VITAMINB12, FOLATE, FERRITIN, TIBC, IRON, RETICCTPCT in the last 72 hours. Sepsis Labs: Recent Labs  Lab 01/30/17 0155 01/30/17 0508 01/30/17 0839  LATICACIDVEN 1.7 1.2 1.5    Recent Results (from the past 240 hour(s))  Surgical pcr screen     Status: None   Collection Time: 01/31/17  9:00 PM  Result Value Ref Range Status   MRSA, PCR NEGATIVE NEGATIVE Final   Staphylococcus aureus NEGATIVE NEGATIVE Final    Comment: (NOTE) The Xpert SA Assay (FDA approved for NASAL specimens in patients 59 years of age and older), is one component of a comprehensive surveillance program. It is not intended to diagnose infection nor to guide or monitor treatment.   Aerobic/Anaerobic Culture (surgical/deep wound)     Status: None (Preliminary result)   Collection Time: 02/01/17  2:21 PM  Result Value Ref Range Status   Specimen Description ABSCESS  Final   Special Requests NONE  Final  Gram Stain   Final    MODERATE WBC PRESENT,BOTH PMN AND MONONUCLEAR FEW GRAM POSITIVE COCCI IN PAIRS AND CHAINS    Culture   Final    NO GROWTH 1 DAY Performed at [facility], [place] [address] [place], [place] [address]    Report Status PENDING  Incomplete         Radiology Studies: No results found.      Scheduled Meds: . insulin aspart  0-15 Units Subcutaneous TID WC  . insulin aspart  0-5 Units Subcutaneous QHS  . insulin aspart  3 Units Subcutaneous TID WC  . insulin glargine  13 Units Subcutaneous Daily   Continuous Infusions: . vancomycin Stopped (02/02/17 0715)     LOS: 2 days    Time spent: 25 minutes    Loretha Stapler, MD [facility] Pager [phone]  If 7PM-7AM, please contact night-coverage [email or web address] Password [email or web address] 02/02/2017, 1:16 PM

## 2017-02-02 NOTE — Addendum Note (Signed)
Addendum  created 02/02/17 1408 by Charmaine Downs, CRNA   Sign clinical note

## 2017-02-02 NOTE — Anesthesia Postprocedure Evaluation (Signed)
Anesthesia Post Note  Patient: Grant Yang  Procedure(s) Performed: INCISION AND DRAINAGE ABSCESS LEFT HAND (Left )  Patient location during evaluation: PACU Anesthesia Type: General Level of consciousness: awake and alert Pain management: satisfactory to patient Vital Signs Assessment: post-procedure vital signs reviewed and stable Respiratory status: spontaneous breathing Cardiovascular status: stable Postop Assessment: no apparent nausea or vomiting Anesthetic complications: no Comments: Late entry T. Yordan Martindale CRNA 02/02/2017 0645     Last Vitals:  Vitals:   02/01/17 2226 02/02/17 0629  BP: 117/66 (!) 116/59  Pulse: 76 73  Resp: 20 14  Temp: 37.3 C 37.1 C  SpO2: 98% 96%    Last Pain:  Vitals:   02/02/17 0629  TempSrc: Oral  PainSc:                  Drucie Opitz

## 2017-02-02 NOTE — Evaluation (Signed)
Physical Therapy Evaluation Patient Details Name: Grant Yang MRN: [id number] DOB: 06-09-1955 Today's Date: 02/02/2017   History of Present Illness  Grant Yang is a 61 y.o. male s/p INCISION AND DRAINAGE ABSCESS LEFT HAND (Left) 02/01/17, with medical history significant for diabetes mellitus, hypertension, and prostate cancer, now presenting to the emergency department for evaluation of pain, redness, and swelling involving the left hand.  Patient reports that he was moving some wood approximately 1 week ago, had a splinter lodged in the webspace between the first and second digits of the left hand, was able to remove it, but subsequently developed redness, swelling, and this at that site.  Since that time, redness, swelling, and pain have increased and have begun to spread proximally to involve the distal forearm.  The patient denies any fevers or chills.  He denies chest pain, cough, or dyspnea.  There has not been any purulent discharge from the site.  Patient reports that he has, until recently, been without insurance and has not been treated for his chronic medical conditions.  Recently obtained insurance and reports follow-up regarding his prostate cancer scheduled for February 2019.    Clinical Impression  Patient instructed ROM exercises for left hand, encouraged to keep LUE elevated when lying/sitting to prevent swelling, use ice if painful, no heavy lifting with left hand, call his MD if draining noted when he returns home and follow all discharge instructions/precautions provided by his ortho MD.  PLAN: patient discharged from physical therapy to care of nursing for ambulation/out of bed ad lib for length of stay.    Follow Up Recommendations No PT follow up    Equipment Recommendations  None recommended by PT    Recommendations for Other Services       Precautions / Restrictions Precautions Precautions: None Restrictions Weight Bearing Restrictions: No       Mobility  Bed Mobility Overal bed mobility: Independent                Transfers Overall transfer level: Independent                  Ambulation/Gait Ambulation/Gait assistance: Independent Ambulation Distance (Feet): 150 Feet Assistive device: None Gait Pattern/deviations: WFL(Within Functional Limits)   Gait velocity interpretation: at or above normal speed for age/gender    Stairs Stairs: Yes Stairs assistance: Modified independent (Device/Increase time) Stair Management: One rail Right;One rail Left;Alternating pattern Number of Stairs: 9 General stair comments: good return demonstrated for going up/down stairs without loss of balance using 1 siderail  Wheelchair Mobility    Modified Rankin (Stroke Patients Only)       Balance Overall balance assessment: Independent                                           Pertinent Vitals/Pain Pain Assessment: No/denies pain    Home Living Family/patient expects to be discharged to:: Private residence Living Arrangements: Alone Available Help at Discharge: Family(his son) Type of Home: House Home Access: Stairs to enter Entrance Stairs-Rails: None Entrance Stairs-Number of Steps: 1 Home Layout: One level Home Equipment: None      Prior Function Level of Independence: Independent               Hand Dominance        Extremity/Trunk Assessment   Upper Extremity Assessment Upper Extremity Assessment: Overall WFL for tasks  assessed;LUE deficits/detail LUE Deficits / Details: grossly 4+/5 except hand/grip strength grossly -3/5    Lower Extremity Assessment Lower Extremity Assessment: Overall WFL for tasks assessed    Cervical / Trunk Assessment Cervical / Trunk Assessment: Normal  Communication   Communication: No difficulties  Cognition Arousal/Alertness: Awake/alert Behavior During Therapy: WFL for tasks assessed/performed Overall Cognitive Status: Within Functional  Limits for tasks assessed                                        General Comments      Exercises     Assessment/Plan    PT Assessment Patent does not need any further PT services  PT Problem List         PT Treatment Interventions      PT Goals (Current goals can be found in the Care Plan section)  Acute Rehab PT Goals Patient Stated Goal: return home Time For Goal Achievement: 02-09-17 Potential to Achieve Goals: Good    Frequency     Barriers to discharge        Co-evaluation               AM-PAC PT "6 Clicks" Daily Activity  Outcome Measure Difficulty turning over in bed (including adjusting bedclothes, sheets and blankets)?: None Difficulty moving from lying on back to sitting on the side of the bed? : None Difficulty sitting down on and standing up from a chair with arms (e.g., wheelchair, bedside commode, etc,.)?: None Help needed moving to and from a bed to chair (including a wheelchair)?: None Help needed walking in hospital room?: None Help needed climbing 3-5 steps with a railing? : None 6 Click Score: 24    End of Session   Activity Tolerance: Patient tolerated treatment well Patient left: in bed;with call bell/phone within reach Nurse Communication: Mobility status PT Visit Diagnosis: Unsteadiness on feet (R26.81);Other abnormalities of gait and mobility (R26.89);Muscle weakness (generalized) (M62.81)    Time: 9937-1696 PT Time Calculation (min) (ACUTE ONLY): 21 min   Charges:   PT Evaluation $PT Eval Low Complexity: 1 Low PT Treatments $Gait Training: 8-22 mins   PT G Codes:        8:59 AM, 02-09-2017 Lonell Grandchild, MPT Physical Therapist with [facility] 336 [phone] office [phone] mobile phone

## 2017-02-02 NOTE — Anesthesia Postprocedure Evaluation (Signed)
Anesthesia Post Note  Patient: Grant Yang  Procedure(s) Performed: INCISION AND DRAINAGE ABSCESS LEFT HAND (Left )  Patient location during evaluation: Nursing Unit Anesthesia Type: General Level of consciousness: awake and alert and patient cooperative Pain management: pain level controlled Vital Signs Assessment: post-procedure vital signs reviewed and stable Respiratory status: spontaneous breathing, nonlabored ventilation and respiratory function stable Cardiovascular status: blood pressure returned to baseline Postop Assessment: no apparent nausea or vomiting and adequate PO intake Anesthetic complications: no     Last Vitals:  Vitals:   02/02/17 0629 02/02/17 1300  BP: (!) 116/59 (!) 112/55  Pulse: 73 80  Resp: 14 18  Temp: 37.1 C 36.7 C  SpO2: 96% 96%    Last Pain:  Vitals:   02/02/17 1300  TempSrc: Oral  PainSc:                  Karlisha Mathena J

## 2017-02-03 DIAGNOSIS — E871 Hypo-osmolality and hyponatremia: Secondary | ICD-10-CM

## 2017-02-03 DIAGNOSIS — E1165 Type 2 diabetes mellitus with hyperglycemia: Secondary | ICD-10-CM

## 2017-02-03 DIAGNOSIS — I10 Essential (primary) hypertension: Secondary | ICD-10-CM

## 2017-02-03 DIAGNOSIS — Z9119 Patient's noncompliance with other medical treatment and regimen: Secondary | ICD-10-CM

## 2017-02-03 LAB — COMPREHENSIVE METABOLIC PANEL
ALBUMIN: 3.1 g/dL — AB (ref 3.5–5.0)
ALK PHOS: 97 U/L (ref 38–126)
ALT: 19 U/L (ref 17–63)
AST: 18 U/L (ref 15–41)
Anion gap: 7 (ref 5–15)
BUN: 13 mg/dL (ref 6–20)
CALCIUM: 8.8 mg/dL — AB (ref 8.9–10.3)
CO2: 25 mmol/L (ref 22–32)
CREATININE: 0.74 mg/dL (ref 0.61–1.24)
Chloride: 102 mmol/L (ref 101–111)
GFR calc Af Amer: >60 mL/min (ref >60)
GFR calc non Af Amer: >60 mL/min (ref >60)
GLUCOSE: 167 mg/dL — AB (ref 65–99)
Potassium: 4 mmol/L (ref 3.5–5.1)
SODIUM: 134 mmol/L — AB (ref 135–145)
Total Bilirubin: 1.3 mg/dL — ABNORMAL HIGH (ref 0.3–1.2)
Total Protein: 6.8 g/dL (ref 6.5–8.1)

## 2017-02-03 LAB — GLUCOSE, CAPILLARY
GLUCOSE-CAPILLARY: 180 mg/dL — AB (ref 65–99)
GLUCOSE-CAPILLARY: 289 mg/dL — AB (ref 65–99)

## 2017-02-03 LAB — VANCOMYCIN, TROUGH: Vancomycin Tr: 6 ug/mL — ABNORMAL LOW (ref 15–20)

## 2017-02-03 MED ORDER — DOXYCYCLINE HYCLATE 100 MG PO TABS: 100.0000 mg | ORAL_TABLET | Freq: Two times a day (BID) | ORAL | 0 refills | Status: AC

## 2017-02-03 MED ORDER — METFORMIN HCL 500 MG PO TABS: 500.0000 mg | ORAL_TABLET | Freq: Two times a day (BID) | ORAL | 1 refills | Status: DC

## 2017-02-03 MED ORDER — VANCOMYCIN HCL 10 G IV SOLR
1500.0000 mg | Freq: Two times a day (BID) | INTRAVENOUS | Status: DC
  Administered 2017-02-03: 1500 mg via INTRAVENOUS
  Filled 2017-02-03 (×5): qty 1500

## 2017-02-03 MED ORDER — HYDROCODONE-ACETAMINOPHEN 5-325 MG PO TABS: 1.0000 | ORAL_TABLET | ORAL | 0 refills | Status: DC | PRN

## 2017-02-03 MED ORDER — INSULIN SYRINGES (DISPOSABLE) U-100 0.3 ML MISC: 10.0000 [IU] | Freq: Two times a day (BID) | 0 refills | Status: DC

## 2017-02-03 MED ORDER — DOXYCYCLINE HYCLATE 100 MG PO TABS
100.0000 mg | ORAL_TABLET | Freq: Two times a day (BID) | ORAL | Status: DC
  Administered 2017-02-03: 100 mg via ORAL
  Filled 2017-02-03: qty 1

## 2017-02-03 MED ORDER — BLOOD GLUCOSE METER KIT: PACK | 0 refills | Status: DC

## 2017-02-03 MED ORDER — INSULIN NPH ISOPHANE & REGULAR (70-30) 100 UNIT/ML ~~LOC~~ SUSP: 10.0000 [IU] | Freq: Two times a day (BID) | SUBCUTANEOUS | 1 refills | Status: DC

## 2017-02-03 NOTE — Progress Notes (Signed)
Discharge instructions gone over with patient, verbalized understanding. IV removed, patient tolerated procedure well. Printed prescription given to patient. 

## 2017-02-03 NOTE — Progress Notes (Addendum)
Pharmacy Antibiotic Note  Grant Yang is a 61 y.o. male admitted on 01/29/2017 with wound infection.  Pharmacy has been consulted for VANCOMYCIN dosing.  On 02/01/2017 pt went to surgery for I&D of left hand. Awaiting cx results. Patient is afebrile. Vancomycin trough reported as 71mcg/ml. Estimated pharmacokinetic parameters: Ke =0.108 hr-1, t 1/2= 6.5hours, Vd= 63 L. Adjust vancomycin for troughs 10-79mcg/ml.   Plan: Increase Vancomycin 1500mg  IV q12h  Goal trough 10-15 mcg/mL. F/U cxs and clinical progress Monitor V/S, labs F/U deescalation of IV abx to po tx  Height: 5\' 11"  (180.3 cm) Weight: 198 lb (89.8 kg) IBW/kg (Calculated) : 75.3  Temp (24hrs), Avg:97.9 F (36.6 C), Min:97.7 F (36.5 C), Max:98.1 F (36.7 C)  Recent Labs  Lab 01/30/17 0031 01/30/17 0155 01/30/17 0508 01/30/17 0839 01/31/17 0442 02/03/17 0629  WBC 19.8*  --  16.5*  --  13.7*  --   CREATININE 0.80  --  0.72  --  0.63 0.74  LATICACIDVEN  --  1.7 1.2 1.5  --   --   VANCOTROUGH  --   --   --   --   --  6*    Estimated Creatinine Clearance: 105.9 mL/min (by C-G formula based on SCr of 0.74 mg/dL).    Allergies  Allergen Reactions  . Penicillins Other (See Comments)    Childhood allergy. Has patient had a PCN reaction causing immediate rash, facial/tongue/throat swelling, SOB or lightheadedness with hypotension: Unknown Has patient had a PCN reaction causing severe rash involving mucus membranes or skin necrosis: Unknown Has patient had a PCN reaction that required hospitalization: Unknown Has patient had a PCN reaction occurring within the last 10 years: No If all of the above answers are "NO", then may proceed with Cephalosporin use.    Antimicrobials this admission: Vancomycin 12/11 >>   Dose adjustments this admission: 12/15 Increase vancomycin 1500mg  IV q12h  Microbiology results:   12/13 Wound Cx: few GPC on gram stain, no growth to date 12/12 MRSA PCR is negative  Thank you for  allowing pharmacy to be a part of this patient's care.  Isac Sarna, BS Pharm D, BCPS Clinical Pharmacist Pager [phone] 02/03/2017 8:16 AM

## 2017-02-03 NOTE — Discharge Summary (Signed)
Physician Discharge Summary  JOHNMARK GEIGER KDT:[id number] DOB: 1955/08/02 DOA: 01/29/2017  PCP: Patient, No Pcp Per  Admit date: 01/29/2017 Discharge date: 02/03/2017  Admitted From: Home Disposition: Home  Recommendations for Outpatient Follow-up:  1. Follow up with PCP in 1-2 weeks 2. Please obtain BMP/CBC in one week 3. Please follow up on the following pending results:  Home Health: None Equipment/Devices: Materials for dressing change  Discharge Condition: Stable CODE STATUS: Full code Diet recommendation: Carb modified  Brief/Interim Summary: 61 year old male with a history of diabetes mellitus, prostate cancer, and hypertension presenting with 2-day history of increasing pain, edema, and erythema of his left hand. The patient states that while moving wood, I would splinter lodged in the webspace between his left first and second digits approximately 2 weeks prior to admission. Around January 26, 2017,he noticedincreasing erythema, edema, and pain onhisleft hand at the base of his left thumb.He tried to pick the splinter out with a toothpick, so needle, and utility knife and ultimately was able to get the splinter out on January 27, 2017. However he continued to have increasing pain, edema, and streaking erythema proximally to the wrist. As a result, the patient presented for further evaluation. He denied any fevers, chills, chest pain, shortness breath, nausea, vomiting, diarrhea, abdominal pain. In the emergency department, the patient was started on clindamycin IV.  Patient was seen and evaluated by hand/orthopedic surgery Dr. Aline Brochure and was taken to the operating room on 02/01/2017 for I&D.  Cultures were obtained in the operating room.  He was continued on vancomycin until culture data resulted.  The patient was stable for discharge on 02/03/2017 and unfortunately he had been on antibiotics prior to I&D in the operating room so is unclear whether the culture would  ever show causative organism for abscess.  He was transitioned to doxycycline which she will continue for an additional 7 days.  He was instructed to follow-up with Dr. Aline Brochure outpatient as well as to set up care with a primary care physician.  He was given prescriptions for insulin and he states he had been shown by the diabetes educator how to administer insulin.  Dressing change materials were given at time of discharge    Discharge Diagnoses:  Principal Problem:   Cellulitis of left hand Active Problems:   DM (diabetes mellitus), type 2, uncontrolled ([place])   Noncompliance   Hyponatremia   Prostate cancer ([place])   Hypertension   Uncontrolled type 2 diabetes mellitus with hyperglycemia [facility])    Discharge Instructions  Discharge Instructions    Call MD for:  difficulty breathing, headache or visual disturbances   Complete by:  As directed    Call MD for:  extreme fatigue   Complete by:  As directed    Call MD for:  hives   Complete by:  As directed    Call MD for:  persistant dizziness or light-headedness   Complete by:  As directed    Call MD for:  persistant nausea and vomiting   Complete by:  As directed    Call MD for:  redness, tenderness, or signs of infection (pain, swelling, redness, odor or green/yellow discharge around incision site)   Complete by:  As directed    Call MD for:  severe uncontrolled pain   Complete by:  As directed    Call MD for:  temperature >100.4   Complete by:  As directed    Diet Carb Modified   Complete by:  As directed  Discharge instructions   Complete by:  As directed    Antibiotics as prescribed Use insulin as prescribed Attempt to set up care with a primary care physician within the next 2-3 weeks  Test blood sugar 4 times a day Packing changed twice a day   Increase activity slowly   Complete by:  As directed      Allergies as of 02/03/2017      Reactions   Penicillins Other (See Comments)   Childhood allergy. Has patient  had a PCN reaction causing immediate rash, facial/tongue/throat swelling, SOB or lightheadedness with hypotension: Unknown Has patient had a PCN reaction causing severe rash involving mucus membranes or skin necrosis: Unknown Has patient had a PCN reaction that required hospitalization: Unknown Has patient had a PCN reaction occurring within the last 10 years: No If all of the above answers are "NO", then may proceed with Cephalosporin use.      Medication List    STOP taking these medications   naproxen sodium 220 MG tablet Commonly known as:  ALEVE     TAKE these medications   blood glucose meter kit and supplies Dispense based on patient and insurance preference. Use up to four times daily as directed. (FOR ICD-10 E10.9, E11.9).   calcium carbonate 500 MG chewable tablet Commonly known as:  TUMS - dosed in mg elemental calcium Chew 2 tablets by mouth daily. For indigestion   doxycycline 100 MG tablet Commonly known as:  VIBRA-TABS Take 1 tablet (100 mg total) by mouth every 12 (twelve) hours for 7 days.   HYDROcodone-acetaminophen 5-325 MG tablet Commonly known as:  NORCO/VICODIN Take 1-2 tablets by mouth every 4 (four) hours as needed for moderate pain.   insulin NPH-regular Human (70-30) 100 UNIT/ML injection Commonly known as:  NOVOLIN 70/30 Inject 10 Units into the skin 2 (two) times daily with a meal.   Insulin Syringes (Disposable) U-100 0.3 ML Misc 10 Units by Does not apply route 2 (two) times daily with breakfast and lunch.   metFORMIN 500 MG tablet Commonly known as:  GLUCOPHAGE Take 1 tablet (500 mg total) by mouth 2 (two) times daily with a meal.      Follow-up Information    Carole Civil, MD. Schedule an appointment as soon as possible for a visit in 2 week(s).   Specialties:  Orthopedic Surgery, Radiology Contact information: [address] [place] [address] [phone]        Celene Squibb, MD .   Specialty:  Internal  Medicine Contact information: [place] [address] [phone]          Allergies  Allergen Reactions  . Penicillins Other (See Comments)    Childhood allergy. Has patient had a PCN reaction causing immediate rash, facial/tongue/throat swelling, SOB or lightheadedness with hypotension: Unknown Has patient had a PCN reaction causing severe rash involving mucus membranes or skin necrosis: Unknown Has patient had a PCN reaction that required hospitalization: Unknown Has patient had a PCN reaction occurring within the last 10 years: No If all of the above answers are "NO", then may proceed with Cephalosporin use.     Consultations:  Orthopedic/hand surgery    Procedures/Studies: Mr Hand Left W Wo Contrast  Result Date: 01/30/2017 CLINICAL DATA:  Redness and swelling about the left hand which is worst at the thumb since the patient removed wooden splinter 2 weeks ago. Diabetic patient. EXAM: MRI OF THE LEFT HAND WITHOUT AND WITH CONTRAST TECHNIQUE: Multiplanar,  multisequence MR imaging of the left hand was performed before and after the administration of intravenous contrast. CONTRAST:  18 ml MULTIHANCE GADOBENATE DIMEGLUMINE 529 MG/ML IV SOLN COMPARISON:  Plain films left hand 01/30/2017. FINDINGS: Bones/Joint/Cartilage There is no bone marrow edema or enhancement to suggest osteomyelitis. Well-circumscribed T2 hyperintense lesions in the heads of the third and fourth metacarpals are compatible with cysts or enchondromas. Tiny degenerative cysts in the heads of the first and second metacarpals are also noted. Ligaments Intact. Muscles and Tendons There is mild edema and enhancement in the muscles of the thenar eminence consistent with myositis. No intramuscular abscess is identified. Soft tissues Subcutaneous edema and enhancement are seen centered about the first MCP joint. In the subcutaneous fatty tissues on the volar surface of the thumb eccentric toward the  radial side, a fluid collection measuring 1.7 cm transverse by 0.7 cm AP by 1.3 cm craniocaudal is consistent with an abscess. IMPRESSION: Cellulitis centered at the thenar eminence with a small abscess in the subcutaneous fatty tissues along the volar margin of the first MCP joint on the radial side. Mild edema and enhancement in the musculature of the thenar eminence consistent with myositis. No intramuscular abscess. Negative for osteomyelitis or septic joint. Electronically Signed   By: Inge Rise M.D.   On: 01/30/2017 15:45   Dg Hand Complete Left  Result Date: 01/30/2017 CLINICAL DATA:  Had a splinter at the base of the left thumb 1 week ago. Persistent pain and swelling in this region. Concern for persistent foreign body. EXAM: LEFT HAND - COMPLETE 3+ VIEW COMPARISON:  None. FINDINGS: No radiopaque foreign bodies are seen. Note that a wood splinter is often not visible on radiograph. There is no evidence of osseous disruption. Visualized joint spaces are grossly preserved. Subcortical cysts are noted at the distal aspect of the second metacarpal, distal aspect of the third proximal phalanx, and distal aspect of the fourth middle phalanx. The carpal rows appear grossly intact, and demonstrate normal alignment. Mild soft tissue swelling is suggested about the wrist and at the base of the thumb. IMPRESSION: 1. No evidence of osseous disruption. 2. No radiopaque foreign body seen. Note that a wood splinter is often not visible on radiograph. Electronically Signed   By: Garald Balding M.D.   On: 01/30/2017 01:29      Subjective: Patient in good spirits today.  He is hopeful to go home.  He states he understands he needs to take insulin at home and has no questions on how to administer it.  He says he tried to set up a follow-up appointment with a primary care physician but was placed on hold and ultimately hung up due to long wait time.  He does know he needs to follow-up with her primary care  physician as well as Dr. Aline Brochure when he is discharged.  Discharge Exam: Vitals:   02/02/17 2105 02/03/17 0500  BP: 126/70 112/68  Pulse: 69 70  Resp: 18 16  Temp: 98.1 F (36.7 C) 97.7 F (36.5 C)  SpO2: 98% 99%   Vitals:   02/02/17 0629 02/02/17 1300 02/02/17 2105 02/03/17 0500  BP: (!) 116/59 (!) 112/55 126/70 112/68  Pulse: 73 80 69 70  Resp: 14 18 18 16   Temp: 98.8 F (37.1 C) 98 F (36.7 C) 98.1 F (36.7 C) 97.7 F (36.5 C)  TempSrc: Oral Oral Oral Tympanic  SpO2: 96% 96% 98% 99%  Weight:      Height:  General: Pt is alert, awake, not in acute distress Cardiovascular: RRR, S1/S2 +, no rubs, no gallops Respiratory: CTA bilaterally, no wheezing, no rhonchi Abdominal: Soft, NT, ND, bowel sounds + Extremities: Surgical site of left hand still wrapped in dressing, neurovascularly intact distal to dressing    The results of significant diagnostics from this hospitalization (including imaging, microbiology, ancillary and laboratory) are listed below for reference.     Microbiology: Recent Results (from the past 240 hour(s))  Surgical pcr screen     Status: None   Collection Time: 01/31/17  9:00 PM  Result Value Ref Range Status   MRSA, PCR NEGATIVE NEGATIVE Final   Staphylococcus aureus NEGATIVE NEGATIVE Final    Comment: (NOTE) The Xpert SA Assay (FDA approved for NASAL specimens in patients 47 years of age and older), is one component of a comprehensive surveillance program. It is not intended to diagnose infection nor to guide or monitor treatment.   Aerobic/Anaerobic Culture (surgical/deep wound)     Status: None (Preliminary result)   Collection Time: 02/01/17  2:21 PM  Result Value Ref Range Status   Specimen Description ABSCESS  Final   Special Requests NONE  Final   Gram Stain   Final    MODERATE WBC PRESENT,BOTH PMN AND MONONUCLEAR FEW GRAM POSITIVE COCCI IN PAIRS AND CHAINS    Culture   Final    NO GROWTH 1 DAY Performed at [facility], [place] [address] [place], [place] [address]    Report Status PENDING  Incomplete     Labs: BNP (last 3 results) No results for input(s): BNP in the last 8760 hours. Basic Metabolic Panel: Recent Labs  Lab 01/30/17 0031 01/30/17 0508 01/31/17 0442 02/03/17 0629  NA 130* 134* 135 134*  K 3.9 3.7 3.8 4.0  CL 99* 103 107 102  CO2 21* 21* 23 25  GLUCOSE 422* 273* 192* 167*  BUN 14 12 10 13   CREATININE 0.80 0.72 0.63 0.74  CALCIUM 9.3 8.6* 8.4* 8.8*   Liver Function Tests: Recent Labs  Lab 01/30/17 0031 01/30/17 1347 01/31/17 0442 02/03/17 0629  AST 16  --  12* 18  ALT 17  --  13* 19  ALKPHOS 85  --  62 97  BILITOT 1.6* 1.5* 1.4* 1.3*  PROT 7.5  --  6.0* 6.8  ALBUMIN 4.0  --  3.1* 3.1*   No results for input(s): LIPASE, AMYLASE in the last 168 hours. No results for input(s): AMMONIA in the last 168 hours. CBC: Recent Labs  Lab 01/30/17 0031 01/30/17 0508 01/31/17 0442  WBC 19.8* 16.5* 13.7*  NEUTROABS 15.6* 12.5*  --   HGB 16.2 15.3 13.8  HCT 47.9 46.0 42.3  MCV 89.4 90.0 90.4  PLT 273 262 254   Cardiac Enzymes: No results for input(s): CKTOTAL, CKMB, CKMBINDEX, TROPONINI in the last 168 hours. BNP: Invalid input(s): POCBNP CBG: Recent Labs  Lab 02/02/17 0730 02/02/17 1108 02/02/17 2101 02/03/17 0724 02/03/17 1138  GLUCAP 168* 211* 183* 180* 289*   D-Dimer No results for input(s): DDIMER in the last 72 hours. Hgb A1c No results for input(s): HGBA1C in the last 72 hours. Lipid Profile No results for input(s): CHOL, HDL, LDLCALC, TRIG, CHOLHDL, LDLDIRECT in the last 72 hours. Thyroid function studies No results for input(s): TSH, T4TOTAL, T3FREE, THYROIDAB in the last 72 hours.  Invalid input(s): FREET3 Anemia work up No results for input(s): VITAMINB12, FOLATE, FERRITIN, TIBC, IRON, RETICCTPCT in the last 72 hours. Urinalysis  Component Value Date/Time   COLORURINE YELLOW 12/28/2010 0813   APPEARANCEUR CLEAR 12/28/2010 0813    LABSPEC 1.025 12/28/2010 0813   PHURINE 5.0 12/28/2010 0813   GLUCOSEU >1000 (A) 12/28/2010 0813   HGBUR TRACE (A) 12/28/2010 0813   BILIRUBINUR NEGATIVE 12/28/2010 0813   KETONESUR NEGATIVE 12/28/2010 0813   PROTEINUR 100 (A) 12/28/2010 0813   UROBILINOGEN 0.2 12/28/2010 0813   NITRITE NEGATIVE 12/28/2010 0813   LEUKOCYTESUR NEGATIVE 12/28/2010 0813   Sepsis Labs Invalid input(s): PROCALCITONIN,  WBC,  LACTICIDVEN Microbiology Recent Results (from the past 240 hour(s))  Surgical pcr screen     Status: None   Collection Time: 01/31/17  9:00 PM  Result Value Ref Range Status   MRSA, PCR NEGATIVE NEGATIVE Final   Staphylococcus aureus NEGATIVE NEGATIVE Final    Comment: (NOTE) The Xpert SA Assay (FDA approved for NASAL specimens in patients 22 years of age and older), is one component of a comprehensive surveillance program. It is not intended to diagnose infection nor to guide or monitor treatment.   Aerobic/Anaerobic Culture (surgical/deep wound)     Status: None (Preliminary result)   Collection Time: 02/01/17  2:21 PM  Result Value Ref Range Status   Specimen Description ABSCESS  Final   Special Requests NONE  Final   Gram Stain   Final    MODERATE WBC PRESENT,BOTH PMN AND MONONUCLEAR FEW GRAM POSITIVE COCCI IN PAIRS AND CHAINS    Culture   Final    NO GROWTH 1 DAY Performed at [facility], [place] [address] [place], [place] [address]    Report Status PENDING  Incomplete     Time coordinating discharge: Over 30 minutes  SIGNED:   Loretha Stapler, MD  [facility] 02/03/2017, 12:25 PM Pager [phone] If 7PM-7AM, please contact night-coverage [email or web address] Password [email or web address]

## 2017-02-05 LAB — GLUCOSE, CAPILLARY: Glucose-Capillary: 224 mg/dL — ABNORMAL HIGH (ref 65–99)

## 2017-02-06 LAB — AEROBIC/ANAEROBIC CULTURE (SURGICAL/DEEP WOUND)

## 2017-02-06 LAB — AEROBIC/ANAEROBIC CULTURE W GRAM STAIN (SURGICAL/DEEP WOUND)

## 2017-02-22 ENCOUNTER — Ambulatory Visit (INDEPENDENT_AMBULATORY_CARE_PROVIDER_SITE_OTHER): Payer: BLUE CROSS/BLUE SHIELD | Admitting: Orthopedic Surgery

## 2017-02-22 DIAGNOSIS — L03114 Cellulitis of left upper limb: Secondary | ICD-10-CM

## 2017-02-22 MED ORDER — DOXYCYCLINE HYCLATE 100 MG PO TABS: 100.0000 mg | ORAL_TABLET | Freq: Two times a day (BID) | ORAL | 1 refills | Status: DC

## 2017-02-22 NOTE — Progress Notes (Signed)
Postop visit status post incision and drainage left thumb thenar eminence  Date of surgery February 01, 2017  This is postop day #21  The patient was on doxycycline  Cultures grew out moderate strep viridans.  His wound is approximately 5 mm long and 2 mm in depth there is some surrounding erythema he has full range of motion of the thumb there is no numbness or tingling the color is otherwise normal  Recommend continue doxycycline dressing changes and come back in 2 weeks

## 2017-03-09 ENCOUNTER — Encounter: Payer: Self-pay | Admitting: Orthopedic Surgery

## 2017-03-09 ENCOUNTER — Ambulatory Visit (INDEPENDENT_AMBULATORY_CARE_PROVIDER_SITE_OTHER): Payer: BLUE CROSS/BLUE SHIELD | Admitting: Orthopedic Surgery

## 2017-03-09 VITALS — BP 179/108 | HR 75 | Ht 71.0 in | Wt 200.0 lb

## 2017-03-09 DIAGNOSIS — L03114 Cellulitis of left upper limb: Secondary | ICD-10-CM

## 2017-03-09 NOTE — Progress Notes (Signed)
Chief Complaint  Patient presents with  . Post-op Follow-up    I and D 02/01/17   Left hand I&D thenar eminence improving no signs of infection full range of motion there is a little split in the superficial portion of the skin but everything looks good he can be discharged and follow-up as needed.

## 2017-04-24 ENCOUNTER — Ambulatory Visit: Payer: BLUE CROSS/BLUE SHIELD | Admitting: Urology

## 2017-04-24 DIAGNOSIS — C61 Malignant neoplasm of prostate: Secondary | ICD-10-CM

## 2017-04-24 DIAGNOSIS — N5201 Erectile dysfunction due to arterial insufficiency: Secondary | ICD-10-CM

## 2017-05-03 ENCOUNTER — Other Ambulatory Visit: Payer: Self-pay | Admitting: Urology

## 2017-05-03 DIAGNOSIS — C61 Malignant neoplasm of prostate: Secondary | ICD-10-CM

## 2017-05-10 ENCOUNTER — Encounter (HOSPITAL_COMMUNITY)
Admission: RE | Admit: 2017-05-10 | Discharge: 2017-05-10 | Disposition: A | Discharge status: Home / Self Care | Payer: BLUE CROSS/BLUE SHIELD | Source: Ambulatory Visit | Attending: Urology | Admitting: Urology

## 2017-05-10 ENCOUNTER — Encounter (HOSPITAL_COMMUNITY): Payer: Self-pay

## 2017-05-10 ENCOUNTER — Ambulatory Visit (HOSPITAL_COMMUNITY)
Admission: RE | Admit: 2017-05-10 | Discharge: 2017-05-10 | Disposition: A | Discharge status: Home / Self Care | Payer: BLUE CROSS/BLUE SHIELD | Source: Ambulatory Visit | Attending: Urology | Admitting: Urology

## 2017-05-10 DIAGNOSIS — C61 Malignant neoplasm of prostate: Secondary | ICD-10-CM

## 2017-05-10 DIAGNOSIS — M898X8 Other specified disorders of bone, other site: Secondary | ICD-10-CM | POA: Diagnosis not present

## 2017-05-10 LAB — POCT I-STAT CREATININE: Creatinine, Ser: 0.8 mg/dL (ref 0.61–1.24)

## 2017-05-10 MED ORDER — TECHNETIUM TC 99M MEDRONATE IV KIT
20.0000 | PACK | Freq: Once | INTRAVENOUS | Status: AC | PRN
  Administered 2017-05-10: 21.2 via INTRAVENOUS

## 2017-05-10 MED ORDER — IOPAMIDOL (ISOVUE-300) INJECTION 61%
100.0000 mL | Freq: Once | INTRAVENOUS | Status: AC | PRN
  Administered 2017-05-10: 80 mL via INTRAVENOUS

## 2017-06-12 ENCOUNTER — Institutional Professional Consult (permissible substitution): Payer: BLUE CROSS/BLUE SHIELD | Admitting: Urology

## 2017-06-12 DIAGNOSIS — N320 Bladder-neck obstruction: Secondary | ICD-10-CM

## 2017-06-12 DIAGNOSIS — C61 Malignant neoplasm of prostate: Secondary | ICD-10-CM

## 2017-07-12 ENCOUNTER — Other Ambulatory Visit: Payer: Self-pay | Admitting: Urology

## 2017-07-26 NOTE — Progress Notes (Signed)
LOV/LABS DR. ZACK HALL 06-25-17 ON CHART   HGBA1C 06-21-17 ON CHART FROM DR [facility] OFFICE

## 2017-07-26 NOTE — Patient Instructions (Signed)
Grant Yang  07/26/2017   Your procedure is scheduled on: 08-02-17   Report to [facility] Main  Entrance    Report to admitting at 8:45AM    Call this number if you have problems the morning of surgery [phone]     Remember: Do not eat food :After Midnight on Tuesday 07-31-17. Drink plenty of clear liquids (SEE LIQUID DIET BELOW) all day Wednesday 08-01-17 and follow all bowel prep orders provided by your surgeon. Nothing by mouth after midnight on Wednesday!  PLEASE DO 1 FLEETS ENEMA THE NIGHT BEFORE YOUR SURGERY PER YOUR SURGEON'S ORDERS!     Take these medicines the morning of surgery with A SIP OF WATER: NONE                                 You may not have any metal on your body including hair pins and              piercings  Do not wear jewelry, make-up, lotions, powders or perfumes, deodorant                    Men may shave face and neck.   Do not bring valuables to the hospital. [place].  Contacts, dentures or bridgework may not be worn into surgery.  Leave suitcase in the car. After surgery it may be brought to your room.                 Please read over the following fact sheets you were given: _____________________________________________________________________     CLEAR LIQUID DIET   Foods Allowed                                                                     Foods Excluded  Coffee and tea, regular and decaf                             liquids that you cannot  Plain Jell-O in any flavor                                             see through such as: Fruit ices (not with fruit pulp)                                     milk, soups, orange juice  Iced Popsicles                                    All solid food Carbonated beverages, regular and diet  Cranberry, grape and apple juices Sports drinks like Gatorade Lightly seasoned clear  broth or consume(fat free) Sugar, honey syrup  Sample Menu Breakfast                                Lunch                                     Supper Cranberry juice                    Beef broth                            Chicken broth Jell-O                                     Grape juice                           Apple juice Coffee or tea                        Jell-O                                      Popsicle                                                Coffee or tea                        Coffee or tea  _____________________________________________________________________  How to Manage Your Diabetes Before and After Surgery  Why is it important to control my blood sugar before and after surgery? . Improving blood sugar levels before and after surgery helps healing and can limit problems. . A way of improving blood sugar control is eating a healthy diet by: o  Eating less sugar and carbohydrates o  Increasing activity/exercise o  Talking with your doctor about reaching your blood sugar goals . High blood sugars (greater than 180 mg/dL) can raise your risk of infections and slow your recovery, so you will need to focus on controlling your diabetes during the weeks before surgery. . Make sure that the doctor who takes care of your diabetes knows about your planned surgery including the date and location.  How do I manage my blood sugar before surgery? . Check your blood sugar at least 4 times a day, starting 2 days before surgery, to make sure that the level is not too high or low. o Check your blood sugar the morning of your surgery when you wake up and every 2 hours until you get to the Short Stay unit. . If your blood sugar is less than 70 mg/dL, you will need to treat for low blood sugar: o Do not take insulin. o Treat a low blood sugar (less than 70 mg/dL) with  cup of clear juice (cranberry or apple), 4 glucose tablets, OR glucose gel. o  Recheck blood sugar in 15 minutes  after treatment (to make sure it is greater than 70 mg/dL). If your blood sugar is not greater than 70 mg/dL on recheck, call [phone] for further instructions. . Report your blood sugar to the short stay nurse when you get to Short Stay.  . If you are admitted to the hospital after surgery: o Your blood sugar will be checked by the staff and you will probably be given insulin after surgery (instead of oral diabetes medicines) to make sure you have good blood sugar levels. o The goal for blood sugar control after surgery is 80-180 mg/dL.   WHAT DO I DO ABOUT MY DIABETES MEDICATION?       . THE MORNING OF SURGERY, Do not take oral diabetes medicines (pills)    Patient Signature:  Date:   Nurse Signature:  Date:   Reviewed and Endorsed by [facility] Patient Education Committee, August 2015    [facility] - Preparing for Surgery Before surgery, you can play an important role.  Because skin is not sterile, your skin needs to be as free of germs as possible.  You can reduce the number of germs on your skin by washing with CHG (chlorahexidine gluconate) soap before surgery.  CHG is an antiseptic cleaner which kills germs and bonds with the skin to continue killing germs even after washing. Please DO NOT use if you have an allergy to CHG or antibacterial soaps.  If your skin becomes reddened/irritated stop using the CHG and inform your nurse when you arrive at Short Stay. Do not shave (including legs and underarms) for at least 48 hours prior to the first CHG shower.  You may shave your face/neck. Please follow these instructions carefully:  1.  Shower with CHG Soap the night before surgery and the  morning of Surgery.  2.  If you choose to wash your hair, wash your hair first as usual with your  normal  shampoo.  3.  After you shampoo, rinse your hair and body thoroughly to remove the  shampoo.                           4.  Use CHG as you would any other liquid soap.  You can apply chg  directly  to the skin and wash                       Gently with a scrungie or clean washcloth.  5.  Apply the CHG Soap to your body ONLY FROM THE NECK DOWN.   Do not use on face/ open                           Wound or open sores. Avoid contact with eyes, ears mouth and genitals (private parts).                       Wash face,  Genitals (private parts) with your normal soap.             6.  Wash thoroughly, paying special attention to the area where your surgery  will be performed.  7.  Thoroughly rinse your body with warm water from the neck down.  8.  DO NOT shower/wash with your normal soap after using and rinsing off  the CHG Soap.  9.  Pat yourself dry with a clean towel.            10.  Wear clean pajamas.            11.  Place clean sheets on your bed the night of your first shower and do not  sleep with pets. Day of Surgery : Do not apply any lotions/deodorants the morning of surgery.  Please wear clean clothes to the hospital/surgery center.  FAILURE TO FOLLOW THESE INSTRUCTIONS MAY RESULT IN THE CANCELLATION OF YOUR SURGERY PATIENT SIGNATURE_________________________________  NURSE SIGNATURE__________________________________  ________________________________________________________________________   Adam Phenix  An incentive spirometer is a tool that can help keep your lungs clear and active. This tool measures how well you are filling your lungs with each breath. Taking long deep breaths may help reverse or decrease the chance of developing breathing (pulmonary) problems (especially infection) following:  A long period of time when you are unable to move or be active. BEFORE THE PROCEDURE   If the spirometer includes an indicator to show your best effort, your nurse or respiratory therapist will set it to a desired goal.  If possible, sit up straight or lean slightly forward. Try not to slouch.  Hold the incentive spirometer in an upright  position. INSTRUCTIONS FOR USE  1. Sit on the edge of your bed if possible, or sit up as far as you can in bed or on a chair. 2. Hold the incentive spirometer in an upright position. 3. Breathe out normally. 4. Place the mouthpiece in your mouth and seal your lips tightly around it. 5. Breathe in slowly and as deeply as possible, raising the piston or the ball toward the top of the column. 6. Hold your breath for 3-5 seconds or for as long as possible. Allow the piston or ball to fall to the bottom of the column. 7. Remove the mouthpiece from your mouth and breathe out normally. 8. Rest for a few seconds and repeat Steps 1 through 7 at least 10 times every 1-2 hours when you are awake. Take your time and take a few normal breaths between deep breaths. 9. The spirometer may include an indicator to show your best effort. Use the indicator as a goal to work toward during each repetition. 10. After each set of 10 deep breaths, practice coughing to be sure your lungs are clear. If you have an incision (the cut made at the time of surgery), support your incision when coughing by placing a pillow or rolled up towels firmly against it. Once you are able to get out of bed, walk around indoors and cough well. You may stop using the incentive spirometer when instructed by your caregiver.  RISKS AND COMPLICATIONS  Take your time so you do not get dizzy or light-headed.  If you are in pain, you may need to take or ask for pain medication before doing incentive spirometry. It is harder to take a deep breath if you are having pain. AFTER USE  Rest and breathe slowly and easily.  It can be helpful to keep track of a log of your progress. Your caregiver can provide you with a simple table to help with this. If you are using the spirometer at home, follow these instructions: [place] IF:   You are having difficultly using the spirometer.  You have trouble using the spirometer as often as  instructed.  Your pain medication is not giving enough relief while using the spirometer.  You  develop fever of 100.5 F (38.1 C) or higher. SEEK IMMEDIATE MEDICAL CARE IF:   You cough up bloody sputum that had not been present before.  You develop fever of 102 F (38.9 C) or greater.  You develop worsening pain at or near the incision site. MAKE SURE YOU:   Understand these instructions.  Will watch your condition.  Will get help right away if you are not doing well or get worse. Document Released: 06/19/2006 Document Revised: 05/01/2011 Document Reviewed: 08/20/2006 Exit[employer] Patient Information 2014 [employer], [place].   ________________________________________________________________________  WHAT IS A BLOOD TRANSFUSION? Blood Transfusion Information  A transfusion is the replacement of blood or some of its parts. Blood is made up of multiple cells which provide different functions.  Red blood cells carry oxygen and are used for blood loss replacement.  White blood cells fight against infection.  Platelets control bleeding.  Plasma helps clot blood.  Other blood products are available for specialized needs, such as hemophilia or other clotting disorders. BEFORE THE TRANSFUSION  Who gives blood for transfusions?   Healthy volunteers who are fully evaluated to make sure their blood is safe. This is blood bank blood. Transfusion therapy is the safest it has ever been in the practice of medicine. Before blood is taken from a donor, a complete history is taken to make sure that person has no history of diseases nor engages in risky social behavior (examples are intravenous drug use or sexual activity with multiple partners). The donor's travel history is screened to minimize risk of transmitting infections, such as malaria. The donated blood is tested for signs of infectious diseases, such as HIV and hepatitis. The blood is then tested to be sure it is compatible with you in  order to minimize the chance of a transfusion reaction. If you or a relative donates blood, this is often done in anticipation of surgery and is not appropriate for emergency situations. It takes many days to process the donated blood. RISKS AND COMPLICATIONS Although transfusion therapy is very safe and saves many lives, the main dangers of transfusion include:   Getting an infectious disease.  Developing a transfusion reaction. This is an allergic reaction to something in the blood you were given. Every precaution is taken to prevent this. The decision to have a blood transfusion has been considered carefully by your caregiver before blood is given. Blood is not given unless the benefits outweigh the risks. AFTER THE TRANSFUSION  Right after receiving a blood transfusion, you will usually feel much better and more energetic. This is especially true if your red blood cells have gotten low (anemic). The transfusion raises the level of the red blood cells which carry oxygen, and this usually causes an energy increase.  The nurse administering the transfusion will monitor you carefully for complications. HOME CARE INSTRUCTIONS  No special instructions are needed after a transfusion. You may find your energy is better. Speak with your caregiver about any limitations on activity for underlying diseases you may have. SEEK MEDICAL CARE IF:   Your condition is not improving after your transfusion.  You develop redness or irritation at the intravenous (IV) site. SEEK IMMEDIATE MEDICAL CARE IF:  Any of the following symptoms occur over the next 12 hours:  Shaking chills.  You have a temperature by mouth above 102 F (38.9 C), not controlled by medicine.  Chest, back, or muscle pain.  People around you feel you are not acting correctly or are confused.  Shortness of breath  or difficulty breathing.  Dizziness and fainting.  You get a rash or develop hives.  You have a decrease in urine  output.  Your urine turns a dark color or changes to pink, red, or brown. Any of the following symptoms occur over the next 10 days:  You have a temperature by mouth above 102 F (38.9 C), not controlled by medicine.  Shortness of breath.  Weakness after normal activity.  The white part of the eye turns yellow (jaundice).  You have a decrease in the amount of urine or are urinating less often.  Your urine turns a dark color or changes to pink, red, or brown. Document Released: 02/04/2000 Document Revised: 05/01/2011 Document Reviewed: 09/23/2007 [facility] Patient Information 2014 [place], [place].  _______________________________________________________________________

## 2017-07-27 ENCOUNTER — Other Ambulatory Visit: Payer: Self-pay

## 2017-07-27 ENCOUNTER — Encounter (HOSPITAL_COMMUNITY): Payer: Self-pay

## 2017-07-27 ENCOUNTER — Encounter (HOSPITAL_COMMUNITY)
Admission: RE | Admit: 2017-07-27 | Discharge: 2017-07-27 | Disposition: A | Discharge status: Home / Self Care | Payer: BLUE CROSS/BLUE SHIELD | Source: Ambulatory Visit | Attending: Urology | Admitting: Urology

## 2017-07-27 DIAGNOSIS — Z01812 Encounter for preprocedural laboratory examination: Secondary | ICD-10-CM | POA: Diagnosis not present

## 2017-07-27 DIAGNOSIS — R7303 Prediabetes: Secondary | ICD-10-CM | POA: Insufficient documentation

## 2017-07-27 DIAGNOSIS — Z01810 Encounter for preprocedural cardiovascular examination: Secondary | ICD-10-CM | POA: Diagnosis present

## 2017-07-27 DIAGNOSIS — I10 Essential (primary) hypertension: Secondary | ICD-10-CM | POA: Diagnosis not present

## 2017-07-27 HISTORY — DX: Malignant (primary) neoplasm, unspecified: C80.1

## 2017-07-27 HISTORY — DX: Type 2 diabetes mellitus with diabetic neuropathy, unspecified: E11.40

## 2017-07-27 HISTORY — DX: Personal history of urinary calculi: Z87.442

## 2017-07-27 LAB — BASIC METABOLIC PANEL
ANION GAP: 6 (ref 5–15)
BUN: 13 mg/dL (ref 6–20)
CALCIUM: 9 mg/dL (ref 8.9–10.3)
CO2: 28 mmol/L (ref 22–32)
CREATININE: 0.84 mg/dL (ref 0.61–1.24)
Chloride: 105 mmol/L (ref 101–111)
GLUCOSE: 178 mg/dL — AB (ref 65–99)
Potassium: 4.5 mmol/L (ref 3.5–5.1)
Sodium: 139 mmol/L (ref 135–145)

## 2017-07-27 LAB — CBC
HCT: 42.1 % (ref 39.0–52.0)
HEMOGLOBIN: 14.3 g/dL (ref 13.0–17.0)
MCH: 29.9 pg (ref 26.0–34.0)
MCHC: 34 g/dL (ref 30.0–36.0)
MCV: 87.9 fL (ref 78.0–100.0)
PLATELETS: 267 10*3/uL (ref 150–400)
RBC: 4.79 MIL/uL (ref 4.22–5.81)
RDW: 13.7 % (ref 11.5–15.5)
WBC: 9.1 10*3/uL (ref 4.0–10.5)

## 2017-07-27 LAB — GLUCOSE, CAPILLARY: GLUCOSE-CAPILLARY: 175 mg/dL — AB (ref 65–99)

## 2017-07-28 LAB — ABO/RH: ABO/RH(D): A POS

## 2017-08-01 NOTE — H&P (Signed)
Office Visit Report     07/03/2017   --------------------------------------------------------------------------------   Grant Yang. Ashlock  MRN: [id number]  PRIMARY CARE:    DOB: 12-07-1955, 62 year old Male  REFERRING:  J Merlene Laughter II  SSN: -[id number]  PROVIDER:  Franchot Gallo, M.D.    TREATING:  Raynelle Bring, M.D.    LOCATION:  [facility] [phone]   --------------------------------------------------------------------------------   CC/HPI: CC: Prostate Cancer   Physician requesting consult: Dr. Franchot Gallo  PCP: Dr. Wende Neighbors   Mr. Meditz is a 65 year old gentleman who was noted to have an elevated PSA of 6.9 and a left apical prostate nodule prompting a TRUS biopsy of the prostate on 03/28/16 that indicated Gleason 3+4=7 adenocarcinoma of the prostate in 7 out of 12 biopsy cores. He was lost to follow-up and again presented to Dr. Diona Fanti more recently. He presented again recently and his PSA was 7.1. Staging studies were negative for metastatic disease. He does wish to proceed with curative therapy and is most interested in surgical treatment.   Family history: None.   Imaging studies:  05/10/17 - CT scan: Negative for metastatic disease. Two small non-obstructing right renal calculi.  05/10/17 - Bone scan: Negative for metastatic disease.   PMH: He has a history of diabetes, hypertension, and a history of a possible stroke which the patient attributes to an episode of hyperglycemia. He states that both his diabetes and his hypertension have been well controlled recently. He does have a childhood allergy to penicillin.  PSH: Laparoscopic cholecystectomy and laparoscopic appendectomy.   TNM stage: T2 N0 M0 (left apical nodule)  PSA: 7.1  Gleason score: 3+4=7  Biopsy (03/28/16): 7/12 cores positive  Left: L lateral apex (10%, 3+4=7), L apex (50%, 3+4=7), L lateral mid (30%, 3+4=7), L mid (50%, 3+4=7), L lateral base (70%, 3+4=7), L base (40%, 3+4=7,  PNI)  Right: R apex (5%, 3+4=7)  Prostate volume: 21.0 cc   Nomogram  OC disease: 33%  EPE: 66%  SVI: 7%  LNI: 7%  PFS (5 year, 10 year): 76%, 62%   Urinary function: IPSS is 3.  Erectile function: SHIM score is 1. He has not been responsive to PDE 5 inhibitors.     ALLERGIES: Penicillin    MEDICATIONS: Janumet     GU PSH: Prostate Needle Biopsy - 03/28/2016    NON-GU PSH: Cholecystectomy (open) Surgical Pathology, Gross And Microscopic Examination For Prostate Needle - 03/28/2016    GU PMH: ED due to arterial insufficiency, Pt has failed PDE 5 inhibitors--sex not that much of an issue currently - 06/12/2017, Notresponding to oral PDE 5 inhibitors, - 04/24/2017, His ED has been resistant to oral PDE 5 inhibitors, - 03/14/2016 Prostate Cancer, Intermediate risk PCa--large volume with delay in treatment. No evidence of metastatic disease on recent CT/bone scand - 06/12/2017, Intermediate risk prostate cancer, diagnosed 03/2016. No evaluation or management as of yet., - 04/24/2017 Elevated PSA - 03/28/2016, PSA has been 6.9 and 5.7 when checked a couple of months apart, starting in September 2017. Unusually high and a 62 year old male. I do not feel a nodule on his exam which was noted by Dr. Alinda Money, - 03/14/2016    NON-GU PMH: Diabetes Type 2 Hypertension Stroke/TIA    FAMILY HISTORY: Diabetes - Father, Mother nephrolithiasis - Runs in Family renal failure - Father   SOCIAL HISTORY: Marital Status: Single Preferred Language: English; Ethnicity: Not Hispanic Or Latino; Race: White Current Smoking Status: Patient does not  smoke anymore. Has not smoked since 02/21/2008. Smoked 2 packs per day.   Tobacco Use Assessment Completed: Used Tobacco in last 30 days? Has never drank.  Drinks 4+ caffeinated drinks per day.     Notes: 1 son 1 daughterRetired   REVIEW OF SYSTEMS:    GU Review Male:   Patient reports get up at night to urinate. Patient denies frequent urination, hard to postpone  urination, burning/ pain with urination, leakage of urine, stream starts and stops, trouble starting your streams, and have to strain to urinate .  Gastrointestinal (Upper):   Patient denies nausea and vomiting.  Gastrointestinal (Lower):   Patient denies diarrhea and constipation.  Constitutional:   Patient reports weight loss. Patient denies fever, night sweats, and fatigue.  Skin:   Patient denies skin rash/ lesion and itching.  Eyes:   Patient denies blurred vision and double vision.  Ears/ Nose/ Throat:   Patient denies sore throat and sinus problems.  Hematologic/Lymphatic:   Patient denies swollen glands and easy bruising.  Cardiovascular:   Patient denies leg swelling and chest pains.  Respiratory:   Patient denies cough and shortness of breath.  Endocrine:   Patient denies excessive thirst.  Musculoskeletal:   Patient reports back pain. Patient denies joint pain.  Neurological:   Patient denies headaches and dizziness.  Psychologic:   Patient denies depression and anxiety.   VITAL SIGNS:      07/03/2017 08:51 AM  Weight 188 lb / 85.28 kg  Height 71 in / 180.34 cm  BP 142/80 mmHg  Pulse 58 /min  Temperature 97.2 F / 36.2 C  BMI 26.2 kg/m   GU PHYSICAL EXAMINATION:    Prostate: Prostate about 40 grams. He does have induration noted toward the left apex. No definite extra prostatic extension is observed.   MULTI-SYSTEM PHYSICAL EXAMINATION:    Constitutional: Well-nourished. No physical deformities. Normally developed. Good grooming.  Neck: Neck symmetrical, not swollen. Normal tracheal position.  Respiratory: No labored breathing, no use of accessory muscles. Normal breath sounds. Clear bilaterally.  Cardiovascular: Regular rate and rhythm. No murmur, no gallop. Normal temperature, normal extremity pulses, no swelling, no varicosities.  Lymphatic: No enlargement of neck, axillae, groin.  Skin: No paleness, no jaundice, no cyanosis. No lesion, no ulcer, no rash.  Neurologic /  Psychiatric: Oriented to time, oriented to place, oriented to person. No depression, no anxiety, no agitation.  Gastrointestinal: No mass, no tenderness, no rigidity, non obese abdomen.  Eyes: Normal conjunctivae. Normal eyelids.  Ears, Nose, Mouth, and Throat: Left ear no scars, no lesions, no masses. Right ear no scars, no lesions, no masses. Nose no scars, no lesions, no masses. Normal hearing. Normal lips.  Musculoskeletal: Normal gait and station of head and neck.     PAST DATA REVIEWED:  Source Of History:  Patient  Lab Test Review:   PSA  Records Review:   Pathology Reports, Previous Patient Records  Urine Test Review:   Urinalysis  X-Ray Review: C.T. Abdomen/Pelvis: Reviewed Films.  Bone Scan: Reviewed Films.     03/16/17 11/15/15  PSA  Total PSA 7.1 ng/dl 6.9 ng/dl    PROCEDURES:          Urinalysis Dipstick Dipstick Cont'd  Color: Yellow Bilirubin: Neg  Appearance: Clear Ketones: Neg  Specific Gravity: 1.020 Blood: Neg  pH: <=5.0 Protein: Trace  Glucose: Trace Urobilinogen: 0.2    Nitrites: Neg    Leukocyte Esterase: Neg    ASSESSMENT:      ICD-10  Details  1 GU:   Prostate Cancer - C61    PLAN:           Schedule Return Visit/Planned Activity: Other See Visit Notes             Note: Will call to schedule surgery.  Return Visit/Planned Activity: Next Available Appointment - PT/OT Referral             Note: Please schedule patient for preoperative PT prior to radical prostatectomy.            Document Letter(s):  Created for Patient: Clinical Summary         Notes:   1. Prostate cancer: I had a detailed discussion with Mr. Scheib today. The patient was counseled about the natural history of prostate cancer and the standard treatment options that are available for prostate cancer. It was explained to him how his age and life expectancy, clinical stage, Gleason score, and PSA affect his prognosis, the decision to proceed with additional staging studies, as  well as how that information influences recommended treatment strategies. We discussed the roles for active surveillance, radiation therapy, surgical therapy, androgen deprivation, as well as ablative therapy options for the treatment of prostate cancer as appropriate to his individual cancer situation. We discussed the risks and benefits of these options with regard to their impact on cancer control and also in terms of potential adverse events, complications, and impact on quality of life particularly related to urinary and sexual function. The patient was encouraged to ask questions throughout the discussion today and all questions were answered to his stated satisfaction. In addition, the patient was provided with and/or directed to appropriate resources and literature for further education about prostate cancer and treatment options. We discussed surgical therapy for prostate cancer including the different available surgical approaches. We discussed, in detail, the risks and expectations of surgery with regard to cancer control, urinary control, and erectile function as well as the expected postoperative recovery process. Additional risks of surgery including but not limited to bleeding, infection, hernia formation, nerve damage, lymphocele formation, bowel/rectal injury potentially necessitating colostomy, damage to the urinary tract resulting in urine leakage, urethral stricture, and the cardiopulmonary risks such as myocardial infarction, stroke, death, venothromboembolism, etc. were explained. The risk of open surgical conversion for robotic/laparoscopic prostatectomy was also discussed.   I did recommend therapy of curative intent. He is most interested in surgical treatment after our discussion today and considering his prior consultations with Dr. Diona Fanti. He will be scheduled for a unilateral right nerve-sparing robot assisted laparoscopic radical prostatectomy and bilateral pelvic lymphadenectomy.    Cc: Dr. Franchot Gallo  Dr. Wende Neighbors         Next Appointment:      Next Appointment: 07/10/2017 02:00 PM    Appointment Type: 42 Minute PT Pre-Op    Location: [facility] [phone]    Provider: Doran Durand    Reason for Visit: Pre-op PT - Nichele Slawson      E & M CODE: I spent at least 55 minutes face to face with the patient, more than 50% of that time was spent on counseling and/or coordinating care.     * Signed by Raynelle Bring, M.D. on 07/03/17 at 4:33 PM (EDT)*

## 2017-08-02 ENCOUNTER — Encounter (HOSPITAL_COMMUNITY): Payer: Self-pay

## 2017-08-02 ENCOUNTER — Ambulatory Visit (HOSPITAL_COMMUNITY): Payer: BLUE CROSS/BLUE SHIELD | Admitting: Certified Registered Nurse Anesthetist

## 2017-08-02 ENCOUNTER — Other Ambulatory Visit: Payer: Self-pay

## 2017-08-02 ENCOUNTER — Encounter (HOSPITAL_COMMUNITY)
Admission: RE | Disposition: A | Discharge status: Home / Self Care | Payer: Self-pay | Source: Ambulatory Visit | Attending: Urology

## 2017-08-02 ENCOUNTER — Observation Stay (HOSPITAL_COMMUNITY)
Admission: RE | Admit: 2017-08-02 | Discharge: 2017-08-03 | Disposition: A | Discharge status: Home / Self Care | Payer: BLUE CROSS/BLUE SHIELD | Source: Ambulatory Visit | Attending: Urology | Admitting: Urology

## 2017-08-02 DIAGNOSIS — I10 Essential (primary) hypertension: Secondary | ICD-10-CM | POA: Insufficient documentation

## 2017-08-02 DIAGNOSIS — Z7984 Long term (current) use of oral hypoglycemic drugs: Secondary | ICD-10-CM | POA: Insufficient documentation

## 2017-08-02 DIAGNOSIS — Z8673 Personal history of transient ischemic attack (TIA), and cerebral infarction without residual deficits: Secondary | ICD-10-CM | POA: Diagnosis not present

## 2017-08-02 DIAGNOSIS — Z87891 Personal history of nicotine dependence: Secondary | ICD-10-CM | POA: Insufficient documentation

## 2017-08-02 DIAGNOSIS — E119 Type 2 diabetes mellitus without complications: Secondary | ICD-10-CM | POA: Diagnosis not present

## 2017-08-02 DIAGNOSIS — Z79899 Other long term (current) drug therapy: Secondary | ICD-10-CM | POA: Insufficient documentation

## 2017-08-02 DIAGNOSIS — C61 Malignant neoplasm of prostate: Secondary | ICD-10-CM | POA: Diagnosis not present

## 2017-08-02 HISTORY — PX: LYMPHADENECTOMY: SHX5960

## 2017-08-02 HISTORY — PX: ROBOT ASSISTED LAPAROSCOPIC RADICAL PROSTATECTOMY: SHX5141

## 2017-08-02 LAB — TYPE AND SCREEN
ABO/RH(D): A POS
ANTIBODY SCREEN: NEGATIVE

## 2017-08-02 LAB — HEMOGLOBIN AND HEMATOCRIT, BLOOD
HCT: 41.5 % (ref 39.0–52.0)
HEMOGLOBIN: 14.3 g/dL (ref 13.0–17.0)

## 2017-08-02 LAB — GLUCOSE, CAPILLARY
GLUCOSE-CAPILLARY: 189 mg/dL — AB (ref 65–99)
GLUCOSE-CAPILLARY: 151 mg/dL — AB (ref 65–99)
GLUCOSE-CAPILLARY: 211 mg/dL — AB (ref 65–99)
Glucose-Capillary: 143 mg/dL — ABNORMAL HIGH (ref 65–99)
Glucose-Capillary: 218 mg/dL — ABNORMAL HIGH (ref 65–99)

## 2017-08-02 SURGERY — XI ROBOTIC ASSISTED LAPAROSCOPIC RADICAL PROSTATECTOMY LEVEL 2
Anesthesia: General

## 2017-08-02 MED ORDER — LIDOCAINE 2% (20 MG/ML) 5 ML SYRINGE
INTRAMUSCULAR | Status: DC | PRN
  Administered 2017-08-02: 100 mg via INTRAVENOUS

## 2017-08-02 MED ORDER — FLEET ENEMA 7-19 GM/118ML RE ENEM
1.0000 | ENEMA | Freq: Once | RECTAL | Status: DC
  Filled 2017-08-02: qty 1

## 2017-08-02 MED ORDER — FENTANYL CITRATE (PF) 250 MCG/5ML IJ SOLN
INTRAMUSCULAR | Status: AC
  Filled 2017-08-02: qty 5

## 2017-08-02 MED ORDER — KETOROLAC TROMETHAMINE 15 MG/ML IJ SOLN
15.0000 mg | Freq: Four times a day (QID) | INTRAMUSCULAR | Status: DC
  Administered 2017-08-02 – 2017-08-03 (×3): 15 mg via INTRAVENOUS
  Filled 2017-08-02 (×3): qty 1

## 2017-08-02 MED ORDER — MAGNESIUM CITRATE PO SOLN
1.0000 | Freq: Once | ORAL | Status: DC
  Filled 2017-08-02: qty 296

## 2017-08-02 MED ORDER — LACTATED RINGERS IV SOLN
INTRAVENOUS | Status: DC | PRN
  Administered 2017-08-02: 12:00:00

## 2017-08-02 MED ORDER — POTASSIUM CHLORIDE IN NACL 20-0.45 MEQ/L-% IV SOLN
INTRAVENOUS | Status: DC
  Administered 2017-08-02 – 2017-08-03 (×3): via INTRAVENOUS
  Filled 2017-08-02 (×3): qty 1000

## 2017-08-02 MED ORDER — MIDAZOLAM HCL 5 MG/5ML IJ SOLN
INTRAMUSCULAR | Status: DC | PRN
  Administered 2017-08-02: 2 mg via INTRAVENOUS

## 2017-08-02 MED ORDER — DIPHENHYDRAMINE HCL 50 MG/ML IJ SOLN: 12.5000 mg | Freq: Four times a day (QID) | INTRAMUSCULAR | Status: DC | PRN

## 2017-08-02 MED ORDER — BUPIVACAINE-EPINEPHRINE 0.25% -1:200000 IJ SOLN
INTRAMUSCULAR | Status: DC | PRN
  Administered 2017-08-02: 30 mL

## 2017-08-02 MED ORDER — HYDROCODONE-ACETAMINOPHEN 5-325 MG PO TABS: 1.0000 | ORAL_TABLET | ORAL | 0 refills | Status: DC | PRN

## 2017-08-02 MED ORDER — SUGAMMADEX SODIUM 200 MG/2ML IV SOLN
INTRAVENOUS | Status: DC | PRN
  Administered 2017-08-02: 200 mg via INTRAVENOUS

## 2017-08-02 MED ORDER — HYDROMORPHONE HCL 2 MG/ML IJ SOLN
INTRAMUSCULAR | Status: AC
  Filled 2017-08-02: qty 1

## 2017-08-02 MED ORDER — HEPARIN SODIUM (PORCINE) 1000 UNIT/ML IJ SOLN
INTRAMUSCULAR | Status: AC
  Filled 2017-08-02: qty 1

## 2017-08-02 MED ORDER — CEFAZOLIN SODIUM-DEXTROSE 1-4 GM/50ML-% IV SOLN
1.0000 g | Freq: Three times a day (TID) | INTRAVENOUS | Status: AC
  Administered 2017-08-02 – 2017-08-03 (×2): 1 g via INTRAVENOUS
  Filled 2017-08-02 (×2): qty 50

## 2017-08-02 MED ORDER — DOCUSATE SODIUM 100 MG PO CAPS
100.0000 mg | ORAL_CAPSULE | Freq: Two times a day (BID) | ORAL | Status: DC
  Administered 2017-08-02 – 2017-08-03 (×2): 100 mg via ORAL
  Filled 2017-08-02 (×2): qty 1

## 2017-08-02 MED ORDER — ALBUMIN HUMAN 5 % IV SOLN
INTRAVENOUS | Status: AC
  Filled 2017-08-02: qty 250

## 2017-08-02 MED ORDER — DEXAMETHASONE SODIUM PHOSPHATE 10 MG/ML IJ SOLN
INTRAMUSCULAR | Status: AC
  Filled 2017-08-02: qty 1

## 2017-08-02 MED ORDER — FENTANYL CITRATE (PF) 100 MCG/2ML IJ SOLN
25.0000 ug | INTRAMUSCULAR | Status: DC | PRN
  Administered 2017-08-02 (×2): 25 ug via INTRAVENOUS

## 2017-08-02 MED ORDER — FENTANYL CITRATE (PF) 100 MCG/2ML IJ SOLN
INTRAMUSCULAR | Status: DC | PRN
  Administered 2017-08-02: 100 ug via INTRAVENOUS
  Administered 2017-08-02: 50 ug via INTRAVENOUS
  Administered 2017-08-02: 100 ug via INTRAVENOUS

## 2017-08-02 MED ORDER — DEXAMETHASONE SODIUM PHOSPHATE 4 MG/ML IJ SOLN
INTRAMUSCULAR | Status: DC | PRN
  Administered 2017-08-02: 10 mg via INTRAVENOUS

## 2017-08-02 MED ORDER — CEFAZOLIN SODIUM-DEXTROSE 2-3 GM-%(50ML) IV SOLR
INTRAVENOUS | Status: DC | PRN
  Administered 2017-08-02: 2 g via INTRAVENOUS

## 2017-08-02 MED ORDER — ONDANSETRON HCL 4 MG/2ML IJ SOLN
INTRAMUSCULAR | Status: DC | PRN
  Administered 2017-08-02: 4 mg via INTRAVENOUS

## 2017-08-02 MED ORDER — PROPOFOL 10 MG/ML IV BOLUS
INTRAVENOUS | Status: AC
  Filled 2017-08-02: qty 20

## 2017-08-02 MED ORDER — PROPOFOL 10 MG/ML IV BOLUS
INTRAVENOUS | Status: DC | PRN
  Administered 2017-08-02: 170 mg via INTRAVENOUS

## 2017-08-02 MED ORDER — BUPIVACAINE-EPINEPHRINE (PF) 0.25% -1:200000 IJ SOLN
INTRAMUSCULAR | Status: AC
  Filled 2017-08-02: qty 30

## 2017-08-02 MED ORDER — ROCURONIUM BROMIDE 10 MG/ML (PF) SYRINGE
PREFILLED_SYRINGE | INTRAVENOUS | Status: AC
  Filled 2017-08-02: qty 5

## 2017-08-02 MED ORDER — INSULIN ASPART 100 UNIT/ML ~~LOC~~ SOLN
0.0000 [IU] | SUBCUTANEOUS | Status: DC
  Administered 2017-08-02 (×2): 5 [IU] via SUBCUTANEOUS
  Administered 2017-08-03: 2 [IU] via SUBCUTANEOUS

## 2017-08-02 MED ORDER — PROMETHAZINE HCL 25 MG/ML IJ SOLN: 6.2500 mg | INTRAMUSCULAR | Status: DC | PRN

## 2017-08-02 MED ORDER — ONDANSETRON HCL 4 MG/2ML IJ SOLN
INTRAMUSCULAR | Status: AC
  Filled 2017-08-02: qty 2

## 2017-08-02 MED ORDER — MIDAZOLAM HCL 2 MG/2ML IJ SOLN
INTRAMUSCULAR | Status: AC
  Filled 2017-08-02: qty 2

## 2017-08-02 MED ORDER — FENTANYL CITRATE (PF) 100 MCG/2ML IJ SOLN
INTRAMUSCULAR | Status: AC
  Administered 2017-08-02: 25 ug via INTRAVENOUS
  Filled 2017-08-02: qty 2

## 2017-08-02 MED ORDER — LACTATED RINGERS IV SOLN
INTRAVENOUS | Status: DC
  Administered 2017-08-02 (×4): via INTRAVENOUS

## 2017-08-02 MED ORDER — SODIUM CHLORIDE 0.9 % IR SOLN
Status: DC | PRN
  Administered 2017-08-02: 1000 mL

## 2017-08-02 MED ORDER — SODIUM CHLORIDE 0.9 % IV BOLUS
1000.0000 mL | Freq: Once | INTRAVENOUS | Status: AC
  Administered 2017-08-02: 1000 mL via INTRAVENOUS

## 2017-08-02 MED ORDER — MORPHINE SULFATE (PF) 2 MG/ML IV SOLN: 2.0000 mg | INTRAVENOUS | Status: DC | PRN

## 2017-08-02 MED ORDER — HYDROMORPHONE HCL 1 MG/ML IJ SOLN
INTRAMUSCULAR | Status: DC | PRN
  Administered 2017-08-02: 1 mg via INTRAVENOUS
  Administered 2017-08-02 (×2): 0.5 mg via INTRAVENOUS

## 2017-08-02 MED ORDER — ACETAMINOPHEN 325 MG PO TABS: 650.0000 mg | ORAL_TABLET | ORAL | Status: DC | PRN

## 2017-08-02 MED ORDER — DIPHENHYDRAMINE HCL 12.5 MG/5ML PO ELIX: 12.5000 mg | ORAL_SOLUTION | Freq: Four times a day (QID) | ORAL | Status: DC | PRN

## 2017-08-02 MED ORDER — LIDOCAINE 2% (20 MG/ML) 5 ML SYRINGE
INTRAMUSCULAR | Status: AC
  Filled 2017-08-02: qty 5

## 2017-08-02 MED ORDER — CEFAZOLIN SODIUM-DEXTROSE 2-4 GM/100ML-% IV SOLN
2.0000 g | Freq: Once | INTRAVENOUS | Status: DC
  Filled 2017-08-02: qty 100

## 2017-08-02 MED ORDER — ROCURONIUM BROMIDE 10 MG/ML (PF) SYRINGE
PREFILLED_SYRINGE | INTRAVENOUS | Status: DC | PRN
  Administered 2017-08-02: 20 mg via INTRAVENOUS
  Administered 2017-08-02: 50 mg via INTRAVENOUS

## 2017-08-02 MED ORDER — SULFAMETHOXAZOLE-TRIMETHOPRIM 800-160 MG PO TABS: 1.0000 | ORAL_TABLET | Freq: Two times a day (BID) | ORAL | 0 refills | Status: DC

## 2017-08-02 MED ORDER — EPHEDRINE SULFATE-NACL 50-0.9 MG/10ML-% IV SOSY
PREFILLED_SYRINGE | INTRAVENOUS | Status: DC | PRN
  Administered 2017-08-02: 5 mg via INTRAVENOUS

## 2017-08-02 SURGICAL SUPPLY — 53 items
APPLICATOR COTTON TIP 6IN STRL (MISCELLANEOUS) ×4 IMPLANT
CATH FOLEY 2WAY SLVR 18FR 30CC (CATHETERS) ×4 IMPLANT
CATH ROBINSON RED A/P 16FR (CATHETERS) ×4 IMPLANT
CATH ROBINSON RED A/P 8FR (CATHETERS) ×4 IMPLANT
CATH TIEMANN FOLEY 18FR 5CC (CATHETERS) ×4 IMPLANT
CHLORAPREP W/TINT 26ML (MISCELLANEOUS) ×4 IMPLANT
CLIP VESOLOCK LG 6/CT PURPLE (CLIP) ×8 IMPLANT
COVER SURGICAL LIGHT HANDLE (MISCELLANEOUS) ×4 IMPLANT
COVER TIP SHEARS 8 DVNC (MISCELLANEOUS) ×2 IMPLANT
COVER TIP SHEARS 8MM DA VINCI (MISCELLANEOUS) ×2
CUTTER ECHEON FLEX ENDO 45 340 (ENDOMECHANICALS) ×4 IMPLANT
DECANTER SPIKE VIAL GLASS SM (MISCELLANEOUS) ×4 IMPLANT
DERMABOND ADVANCED (GAUZE/BANDAGES/DRESSINGS)
DERMABOND ADVANCED .7 DNX12 (GAUZE/BANDAGES/DRESSINGS) IMPLANT
DRAPE ARM DVNC X/XI (DISPOSABLE) ×8 IMPLANT
DRAPE COLUMN DVNC XI (DISPOSABLE) ×2 IMPLANT
DRAPE DA VINCI XI ARM (DISPOSABLE) ×8
DRAPE DA VINCI XI COLUMN (DISPOSABLE) ×2
DRAPE SURG IRRIG POUCH 19X23 (DRAPES) ×4 IMPLANT
DRSG TEGADERM 4X4.75 (GAUZE/BANDAGES/DRESSINGS) ×4 IMPLANT
ELECT REM PT RETURN 15FT ADLT (MISCELLANEOUS) ×4 IMPLANT
GLOVE BIO SURGEON STRL SZ 6.5 (GLOVE) ×3 IMPLANT
GLOVE BIO SURGEONS STRL SZ 6.5 (GLOVE) ×1
GLOVE BIOGEL M STRL SZ7.5 (GLOVE) ×8 IMPLANT
GOWN STRL REUS W/TWL LRG LVL3 (GOWN DISPOSABLE) ×12 IMPLANT
HOLDER FOLEY CATH W/STRAP (MISCELLANEOUS) ×4 IMPLANT
IRRIG SUCT STRYKERFLOW 2 WTIP (MISCELLANEOUS) ×4
IRRIGATION SUCT STRKRFLW 2 WTP (MISCELLANEOUS) ×2 IMPLANT
IV LACTATED RINGERS 1000ML (IV SOLUTION) ×4 IMPLANT
NDL SAFETY ECLIPSE 18X1.5 (NEEDLE) ×2 IMPLANT
NEEDLE HYPO 18GX1.5 SHARP (NEEDLE) ×2
PACK ROBOT UROLOGY CUSTOM (CUSTOM PROCEDURE TRAY) ×4 IMPLANT
SEAL CANN UNIV 5-8 DVNC XI (MISCELLANEOUS) ×10 IMPLANT
SEAL XI 5MM-8MM UNIVERSAL (MISCELLANEOUS) ×10
SOLUTION ELECTROLUBE (MISCELLANEOUS) ×4 IMPLANT
STAPLE RELOAD 45 GRN (STAPLE) ×2 IMPLANT
STAPLE RELOAD 45MM GREEN (STAPLE) ×2
SUT ETHILON 3 0 PS 1 (SUTURE) ×4 IMPLANT
SUT MNCRL 3 0 RB1 (SUTURE) ×2 IMPLANT
SUT MNCRL 3 0 VIOLET RB1 (SUTURE) ×2 IMPLANT
SUT MNCRL AB 4-0 PS2 18 (SUTURE) ×8 IMPLANT
SUT MONOCRYL 3 0 RB1 (SUTURE) ×4
SUT VIC AB 0 CT1 27 (SUTURE) ×2
SUT VIC AB 0 CT1 27XBRD ANTBC (SUTURE) ×2 IMPLANT
SUT VIC AB 0 UR5 27 (SUTURE) ×4 IMPLANT
SUT VIC AB 2-0 SH 27 (SUTURE) ×2
SUT VIC AB 2-0 SH 27X BRD (SUTURE) ×2 IMPLANT
SUT VICRYL 0 UR6 27IN ABS (SUTURE) ×8 IMPLANT
SYR 27GX1/2 1ML LL SAFETY (SYRINGE) ×4 IMPLANT
TOWEL OR 17X26 10 PK STRL BLUE (TOWEL DISPOSABLE) ×4 IMPLANT
TOWEL OR NON WOVEN STRL DISP B (DISPOSABLE) ×4 IMPLANT
TUBING INSUFFLATION 10FT LAP (TUBING) IMPLANT
WATER STERILE IRR 1000ML POUR (IV SOLUTION) ×8 IMPLANT

## 2017-08-02 NOTE — Transfer of Care (Signed)
Immediate Anesthesia Transfer of Care Note  Patient: Grant Yang  Procedure(s) Performed: XI ROBOTIC ASSISTED LAPAROSCOPIC RADICAL PROSTATECTOMY LEVEL 2 (N/A ) LYMPHADENECTOMY, PELVIC (Bilateral )  Patient Location: PACU  Anesthesia Type:General  Level of Consciousness: drowsy and patient cooperative  Airway & Oxygen Therapy: Patient Spontanous Breathing and Patient connected to face mask  Post-op Assessment: Report given to RN and Post -op Vital signs reviewed and stable  Post vital signs: Reviewed and stable  Last Vitals:  Vitals Value Taken Time  BP    Temp    Pulse    Resp    SpO2      Last Pain:  Vitals:   08/02/17 0908  TempSrc:   PainSc: 0-No pain         Complications: No apparent anesthesia complications

## 2017-08-02 NOTE — Progress Notes (Signed)
Patient ID: Grant Yang, male   DOB: 06-11-1955, 62 y.o.   MRN: [id number]  Post-op note  Subjective: The patient is doing well.  No complaints.  Objective: Vital signs in last 24 hours: Temp:  [98 F (36.7 C)-98.2 F (36.8 C)] 98 F (36.7 C) (06/13 1502) Pulse Rate:  [66-80] 77 (06/13 1502) Resp:  [12-18] 16 (06/13 1502) BP: (137-168)/(75-90) 150/83 (06/13 1502) SpO2:  [98 %-100 %] 98 % (06/13 1502) Weight:  [86.7 kg (191 lb 2.2 oz)-87.1 kg (192 lb)] 86.7 kg (191 lb 2.2 oz) (06/13 1502)  Intake/Output from previous day: No intake/output data recorded. Intake/Output this shift: Total I/O In: 2300 [I.V.:1300; IV Piggyback:1000] Out: 180 [Urine:110; Drains:20; Blood:50]  Physical Exam:  General: Alert and oriented. Abdomen: Soft, Nondistended. Incisions: Clean and dry.  Lab Results: Recent Labs    08/02/17 1332  HGB 14.3  HCT 41.5    Assessment/Plan: POD#0   1) Continue to monitor, ambulate, IS   Pryor Curia. MD   LOS: 0 days   Luzelena Heeg,LES 08/02/2017, 3:50 PM

## 2017-08-02 NOTE — Op Note (Addendum)
Preoperative diagnosis: Clinically localized adenocarcinoma of the prostate (clinical stage T2 N0 M0)  Postoperative diagnosis: Clinically localized adenocarcinoma of the prostate (clinical stage T2 N0 M0)  Procedure:  1. Robotic assisted laparoscopic radical prostatectomy (right nerve sparing) 2. Bilateral robotic assisted laparoscopic pelvic lymphadenectomy  Surgeon: Pryor Curia. M.D.  Assistant(s): Debbrah Alar, PA-C  An assistant was required for this surgical procedure.  The duties of the assistant included but were not limited to suctioning, passing suture, camera manipulation, retraction. This procedure would not be able to be performed without an Environmental consultant.  Anesthesia: General  Complications: None  EBL: 50 mL  IVF:  1400 mL crystalloid  Specimens: 1. Prostate and seminal vesicles 2. Right pelvic lymph nodes 3. Left pelvic lymph nodes  Disposition of specimens: Pathology  Drains: 1. 20 Fr coude catheter 2. # 19 Blake pelvic drain  Indication: Grant Yang is a 62 y.o. patient with clinically localized prostate cancer.  After a thorough review of the management options for treatment of prostate cancer, he elected to proceed with surgical therapy and the above procedure(s).  We have discussed the potential benefits and risks of the procedure, side effects of the proposed treatment, the likelihood of the patient achieving the goals of the procedure, and any potential problems that might occur during the procedure or recuperation. Informed consent has been obtained.  Description of procedure:  The patient was taken to the operating room and a general anesthetic was administered. He was given preoperative antibiotics, placed in the dorsal lithotomy position, and prepped and draped in the usual sterile fashion. Next a preoperative timeout was performed. A urethral catheter was placed into the bladder and a site was selected near the umbilicus for placement of the  camera port. This was placed using a standard open Hassan technique which allowed entry into the peritoneal cavity under direct vision and without difficulty. An 8 mm port was placed and a pneumoperitoneum established. The camera was then used to inspect the abdomen and there was no evidence of any intra-abdominal injuries or other abnormalities. The remaining abdominal ports were then placed. 8 mm robotic ports were placed in the right lower quadrant, left lower quadrant, and far left lateral abdominal wall. A 5 mm port was placed in the right upper quadrant and a 12 mm port was placed in the right lateral abdominal wall for laparoscopic assistance. All ports were placed under direct vision without difficulty. The surgical cart was then docked.   Utilizing the cautery scissors, the bladder was reflected posteriorly allowing entry into the space of Retzius and identification of the endopelvic fascia and prostate. The periprostatic fat was then removed from the prostate allowing full exposure of the endopelvic fascia. The endopelvic fascia was then incised from the apex back to the base of the prostate bilaterally and the underlying levator muscle fibers were swept laterally off the prostate thereby isolating the dorsal venous complex. The dorsal vein was then stapled and divided with a 45 mm Flex Echelon stapler. Attention then turned to the bladder neck which was divided anteriorly thereby allowing entry into the bladder and exposure of the urethral catheter. The catheter balloon was deflated and the catheter was brought into the operative field and used to retract the prostate anteriorly. The posterior bladder neck was then examined and was divided allowing further dissection between the bladder and prostate posteriorly until the vasa deferentia and seminal vessels were identified. The vasa deferentia were isolated, divided, and lifted anteriorly. The seminal vesicles  were dissected down to their tips with care  to control the seminal vascular arterial blood supply. These structures were then lifted anteriorly and the space between Denonvillier's fascia and the anterior rectum was developed with a combination of sharp and blunt dissection. This isolated the vascular pedicles of the prostate.  The lateral prostatic fascia on the right side of the prostate was then sharply incised allowing release of the neurovascular bundle. The vascular pedicle of the prostate on the right side was then ligated with Weck clips between the prostate and neurovascular bundle and divided with sharp cold scissor dissection resulting in neurovascular bundle preservation. On the left side, a wide non nerve sparing dissection was performed with Weck clips used to ligate the vascular pedicle of the prostate. The neurovascular bundle on the right side was then separated off the apex of the prostate and urethra.  The urethra was then sharply transected allowing the prostate specimen to be disarticulated. The pelvis was copiously irrigated and hemostasis was ensured. There was no evidence for rectal injury.  Attention then turned to the right pelvic sidewall. The fibrofatty tissue between the external iliac vein, confluence of the iliac vessels, hypogastric artery, and Cooper's ligament was dissected free from the pelvic sidewall with care to preserve the obturator nerve. Weck clips were used for lymphostasis and hemostasis. An identical procedure was performed on the contralateral side and the lymphatic packets were removed for permanent pathologic analysis.  Attention then turned to the urethral anastomosis. A 2-0 Vicryl slip knot was placed between Denonvillier's fascia, the posterior bladder neck, and the posterior urethra to reapproximate these structures. A double-armed 3-0 Monocryl suture was then used to perform a 360 running tension-free anastomosis between the bladder neck and urethra. A new urethral catheter was then placed into the  bladder and irrigated. There were no blood clots within the bladder and the anastomosis appeared to be watertight. A #19 Blake drain was then brought through the left lateral 8 mm port site and positioned appropriately within the pelvis. It was secured to the skin with a nylon suture. The surgical cart was then undocked. The right lateral 12 mm port site was closed at the fascial level with a 0 Vicryl suture placed laparoscopically. All remaining ports were then removed under direct vision. The prostate specimen was removed intact within the Endopouch retrieval bag via the periumbilical camera port site. This fascial opening was closed with two running 0 Vicryl sutures. 0.25% Marcaine was then injected into all port sites and all incisions were reapproximated at the skin level with 4-0 Monocryl subcuticular sutures. Dermabond was applied. The patient appeared to tolerate the procedure well and without complications. The patient was able to be extubated and transferred to the recovery unit in satisfactory condition.   Pryor Curia MD

## 2017-08-02 NOTE — Anesthesia Postprocedure Evaluation (Signed)
Anesthesia Post Note  Patient: Grant Yang  Procedure(s) Performed: XI ROBOTIC ASSISTED LAPAROSCOPIC RADICAL PROSTATECTOMY LEVEL 2 (N/A ) LYMPHADENECTOMY, PELVIC (Bilateral )     Patient location during evaluation: PACU Anesthesia Type: General Level of consciousness: awake and alert Pain management: pain level controlled Vital Signs Assessment: post-procedure vital signs reviewed and stable Respiratory status: spontaneous breathing, nonlabored ventilation, respiratory function stable and patient connected to nasal cannula oxygen Cardiovascular status: blood pressure returned to baseline and stable Postop Assessment: no apparent nausea or vomiting Anesthetic complications: no    Last Vitals:  Vitals:   08/02/17 1415 08/02/17 1502  BP:  (!) 150/83  Pulse: 77 77  Resp: 14 16  Temp:  36.7 C  SpO2: 98% 98%    Last Pain:  Vitals:   08/02/17 1502  TempSrc: Oral  PainSc:                  Catalina Gravel

## 2017-08-02 NOTE — Anesthesia Procedure Notes (Signed)
Procedure Name: Intubation Date/Time: 08/02/2017 10:49 AM Performed by: Claudia Desanctis, CRNA Pre-anesthesia Checklist: Patient identified Patient Re-evaluated:Patient Re-evaluated prior to induction Oxygen Delivery Method: Circle system utilized Preoxygenation: Pre-oxygenation with 100% oxygen Induction Type: IV induction Ventilation: Mask ventilation without difficulty Laryngoscope Size: Mac and 4 Grade View: Grade I Tube type: Oral Tube size: 7.5 mm Number of attempts: 1 Airway Equipment and Method: Stylet Placement Confirmation: ETT inserted through vocal cords under direct vision,  positive ETCO2 and breath sounds checked- equal and bilateral Secured at: 22 cm Tube secured with: Tape Dental Injury: Teeth and Oropharynx as per pre-operative assessment

## 2017-08-02 NOTE — Interval H&P Note (Signed)
History and Physical Interval Note:  08/02/2017 10:00 AM  Grant Yang  has presented today for surgery, with the diagnosis of PROSTATE CANCER  The various methods of treatment have been discussed with the patient and family. After consideration of risks, benefits and other options for treatment, the patient has consented to  Procedure(s): XI ROBOTIC ASSISTED LAPAROSCOPIC RADICAL PROSTATECTOMY LEVEL 2 (N/A) LYMPHADENECTOMY, PELVIC (Bilateral) as a surgical intervention .  The patient's history has been reviewed, patient examined, no change in status, stable for surgery.  I have reviewed the patient's chart and labs.  Questions were answered to the patient's satisfaction.     Sundy Houchins,LES

## 2017-08-02 NOTE — Anesthesia Preprocedure Evaluation (Addendum)
Anesthesia Evaluation  Patient identified by MRN, date of birth, ID band Patient awake    Reviewed: Allergy & Precautions, NPO status , Patient's Chart, lab work & pertinent test results  Airway Mallampati: II  TM Distance: >3 FB Neck ROM: Full    Dental  (+) Dental Advisory Given, Chipped, Loose, Caps, Poor Dentition,    Pulmonary shortness of breath, former smoker,    Pulmonary exam normal breath sounds clear to auscultation       Cardiovascular hypertension, Normal cardiovascular exam Rhythm:Regular Rate:Normal     Neuro/Psych TIACVA, No Residual Symptoms negative psych ROS   GI/Hepatic GERD  ,(+)     substance abuse  marijuana use,   Endo/Other  diabetes, Type 2, Oral Hypoglycemic AgentsPlt 267k  Renal/GU negative Renal ROS   PROSTATE CANCER    Musculoskeletal negative musculoskeletal ROS (+)   Abdominal   Peds  Hematology negative hematology ROS (+)   Anesthesia Other Findings Day of surgery medications reviewed with the patient.  Reproductive/Obstetrics                            Anesthesia Physical Anesthesia Plan  ASA: III  Anesthesia Plan: General   Post-op Pain Management:    Induction: Intravenous  PONV Risk Score and Plan: 3 and Midazolam, Dexamethasone and Ondansetron  Airway Management Planned: Oral ETT  Additional Equipment:   Intra-op Plan:   Post-operative Plan: Extubation in OR  Informed Consent: I have reviewed the patients History and Physical, chart, labs and discussed the procedure including the risks, benefits and alternatives for the proposed anesthesia with the patient or authorized representative who has indicated his/her understanding and acceptance.   Dental advisory given  Plan Discussed with: CRNA  Anesthesia Plan Comments:        Anesthesia Quick Evaluation

## 2017-08-02 NOTE — Discharge Instructions (Signed)

## 2017-08-03 ENCOUNTER — Encounter (HOSPITAL_COMMUNITY): Payer: Self-pay | Admitting: Urology

## 2017-08-03 DIAGNOSIS — C61 Malignant neoplasm of prostate: Secondary | ICD-10-CM | POA: Diagnosis not present

## 2017-08-03 LAB — GLUCOSE, CAPILLARY
GLUCOSE-CAPILLARY: 135 mg/dL — AB (ref 65–99)
Glucose-Capillary: 92 mg/dL (ref 65–99)
Glucose-Capillary: 114 mg/dL — ABNORMAL HIGH (ref 65–99)

## 2017-08-03 LAB — HEMOGLOBIN AND HEMATOCRIT, BLOOD
HEMATOCRIT: 39.5 % (ref 39.0–52.0)
HEMOGLOBIN: 13.3 g/dL (ref 13.0–17.0)

## 2017-08-03 MED ORDER — BISACODYL 10 MG RE SUPP
10.0000 mg | RECTAL | Status: AC
  Administered 2017-08-03: 10 mg via RECTAL
  Filled 2017-08-03: qty 1

## 2017-08-03 MED ORDER — HYDROCODONE-ACETAMINOPHEN 5-325 MG PO TABS: 1.0000 | ORAL_TABLET | Freq: Four times a day (QID) | ORAL | Status: DC | PRN

## 2017-08-03 NOTE — Discharge Summary (Signed)
  Date of admission: 08/02/2017  Date of discharge: 08/03/2017  Admission diagnosis: Prostate Cancer  Discharge diagnosis: Prostate Cancer  History and Physical: For full details, please see admission history and physical. Briefly, Grant Yang is a 62 y.o. gentleman with localized prostate cancer.  After discussing management/treatment options, he elected to proceed with surgical treatment.  Hospital Course: [name] was taken to the operating room on 08/02/2017 and underwent a robotic assisted laparoscopic radical prostatectomy. He tolerated this procedure well and without complications. Postoperatively, he was able to be transferred to a regular hospital room following recovery from anesthesia.  He was able to begin ambulating the night of surgery. He remained hemodynamically stable overnight.  He had excellent urine output with appropriately minimal output from his pelvic drain and his pelvic drain was removed on POD #1.  He was transitioned to oral pain medication, tolerated a clear liquid diet, and had met all discharge criteria and was able to be discharged home later on POD#1.  Laboratory values:  Recent Labs    08/02/17 1332 08/03/17 0524  HGB 14.3 13.3  HCT 41.5 39.5    Disposition: Home  Discharge instruction: He was instructed to be ambulatory but to refrain from heavy lifting, strenuous activity, or driving. He was instructed on urethral catheter care.  Discharge medications:   Allergies as of 08/03/2017      Reactions   Penicillins Other (See Comments)   Childhood allergy. Has patient had a PCN reaction causing immediate rash, facial/tongue/throat swelling, SOB or lightheadedness with hypotension: Unknown Has patient had a PCN reaction causing severe rash involving mucus membranes or skin necrosis: Unknown Has patient had a PCN reaction that required hospitalization: Unknown Has patient had a PCN reaction occurring within the last 10 years: No If all of the  above answers are "NO", then may proceed with Cephalosporin use.      Medication List    STOP taking these medications   doxycycline 100 MG tablet Commonly known as:  VIBRA-TABS     TAKE these medications   blood glucose meter kit and supplies Dispense based on patient and insurance preference. Use up to four times daily as directed. (FOR ICD-10 E10.9, E11.9).   HYDROcodone-acetaminophen 5-325 MG tablet Commonly known as:  NORCO/VICODIN Take 1-2 tablets by mouth every 4 (four) hours as needed for moderate pain or severe pain. What changed:  reasons to take this   insulin NPH-regular Human (70-30) 100 UNIT/ML injection Commonly known as:  NOVOLIN 70/30 Inject 10 Units into the skin 2 (two) times daily with a meal.   Insulin Syringes (Disposable) U-100 0.3 ML Misc 10 Units by Does not apply route 2 (two) times daily with breakfast and lunch.   JANUMET 50-1000 MG tablet Generic drug:  sitaGLIPtin-metformin Take 1 tablet by mouth daily.   metFORMIN 500 MG tablet Commonly known as:  GLUCOPHAGE Take 1 tablet (500 mg total) by mouth 2 (two) times daily with a meal.   sulfamethoxazole-trimethoprim 800-160 MG tablet Commonly known as:  BACTRIM DS,SEPTRA DS Take 1 tablet by mouth 2 (two) times daily. Start the day prior to foley removal appointment       Followup: He will followup in 1 week for catheter removal and to discuss his surgical pathology results.

## 2017-08-03 NOTE — Progress Notes (Signed)
Patient ID: Grant Yang, male   DOB: 1955-11-04, 62 y.o.   MRN: [id number]  1 Day Post-Op Subjective: The patient is doing well.  No nausea or vomiting. Pain is adequately controlled.  Objective: Vital signs in last 24 hours: Temp:  [97.5 F (36.4 C)-98.7 F (37.1 C)] 97.9 F (36.6 C) (06/14 0558) Pulse Rate:  [51-80] 67 (06/14 0558) Resp:  [12-18] 18 (06/14 0558) BP: (101-168)/(69-90) 103/73 (06/14 0558) SpO2:  [97 %-100 %] 97 % (06/14 0558) Weight:  [86.7 kg (191 lb 2.2 oz)-87.1 kg (192 lb)] 86.7 kg (191 lb 2.2 oz) (06/13 1502)  Intake/Output from previous day: 06/13 0701 - 06/14 0700 In: 4650.8 [P.O.:360; I.V.:3202.5; IV Piggyback:1088.3] Out: 2325 [Urine:2085; Drains:190; Blood:50] Intake/Output this shift: No intake/output data recorded.  Physical Exam:  General: Alert and oriented. CV: RRR Lungs: Clear bilaterally. GI: Soft, Nondistended. Incisions: Clean, dry, and intact Urine: Clear Extremities: Nontender, no erythema, no edema.  Lab Results: Recent Labs    08/02/17 1332 08/03/17 0524  HGB 14.3 13.3  HCT 41.5 39.5      Assessment/Plan: POD# 1 s/p robotic prostatectomy.  1) SL IVF 2) Ambulate, Incentive spirometry 3) Transition to oral pain medication 4) Dulcolax suppository 5) D/C pelvic drain 6) Plan for likely discharge later today   Pryor Curia. MD   LOS: 0 days   Cadan Maggart,LES 08/03/2017, 7:24 AM

## 2017-10-02 ENCOUNTER — Other Ambulatory Visit: Payer: Self-pay | Admitting: Radiology

## 2018-01-17 IMAGING — MR MR [PERSON_NAME]*[PERSON_NAME]* WO/W CM
4 of 8 series · 17 of 40 positions shown · IV contrast (multihance)
Comparison: Plain films left hand 01/30/2017.

CLINICAL DATA: Redness and swelling about the left hand which is
worst at the thumb since the patient removed Vesta Santiesteban 2 weeks
ago. Diabetic patient.

EXAM:
MRI OF THE LEFT HAND WITHOUT AND WITH CONTRAST
TECHNIQUE: Multiplanar, multisequence MR imaging of the left hand was performed
before and after the administration of intravenous contrast.
CONTRAST:  18 ml MULTIHANCE GADOBENATE DIMEGLUMINE 529 MG/ML IV SOLN

[Series 4: t2fs axial · axial · 4.0mm · 0.20mm/px · z∈[-73,+56]mm · 4 of 32 slices shown]
[im 1/32]
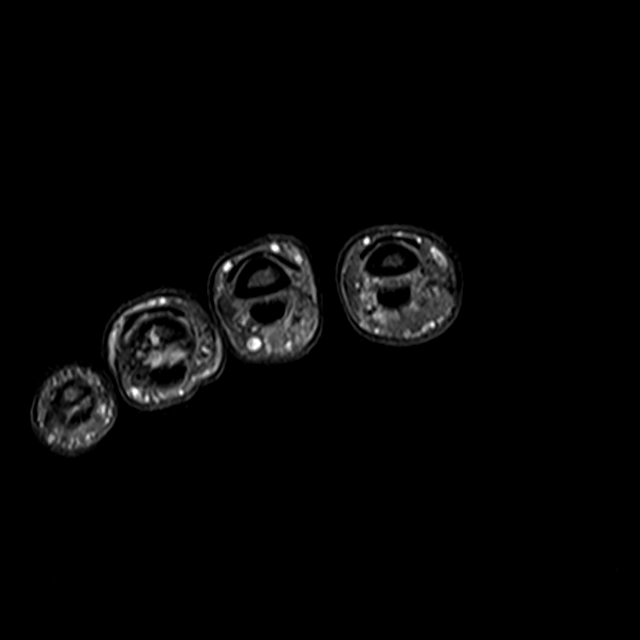
[im 11/32]
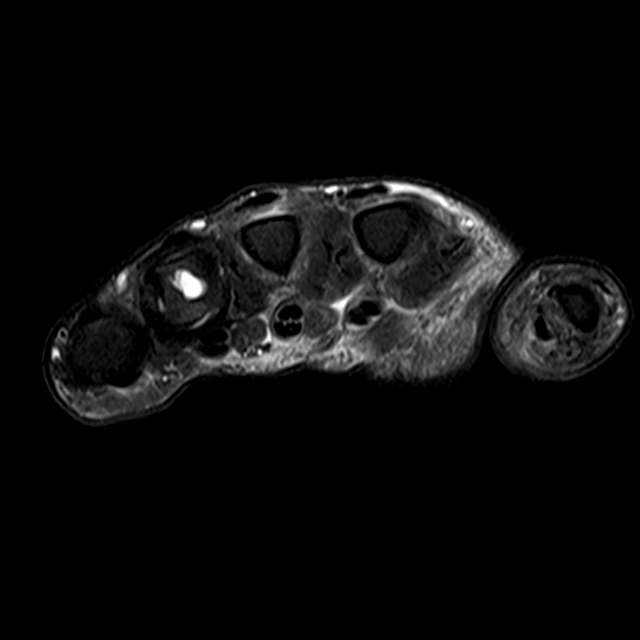
[im 21/32]
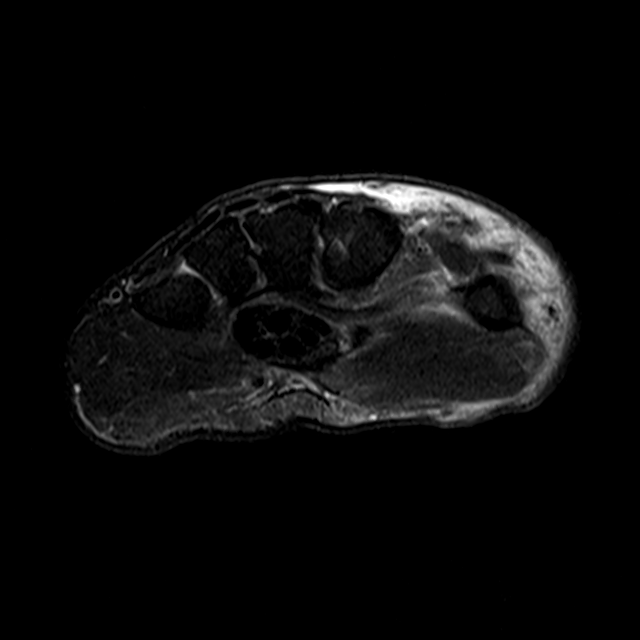
[im 32/32]
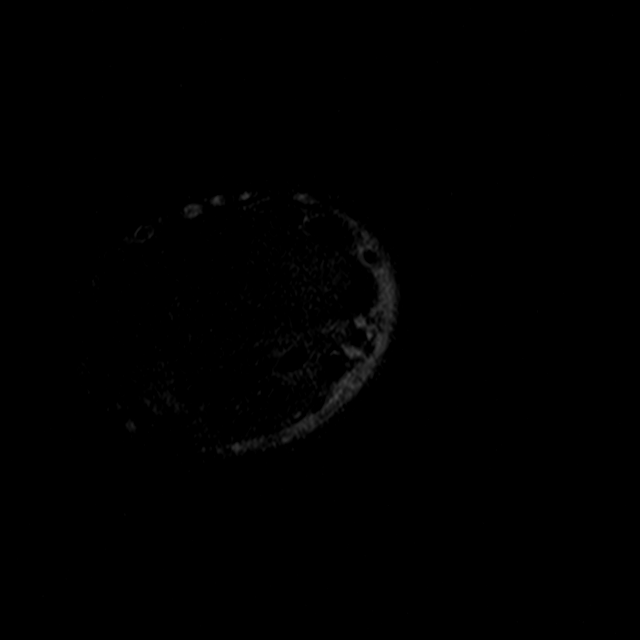

[Series 5: T1 · axial · 4.0mm · 0.28mm/px · z∈[-75,+54]mm · 5 of 32 slices shown (1 of 2)]
[im 1/32]
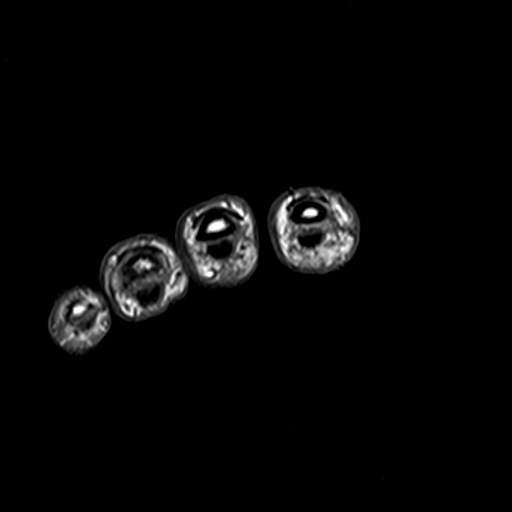
[im 8/32]
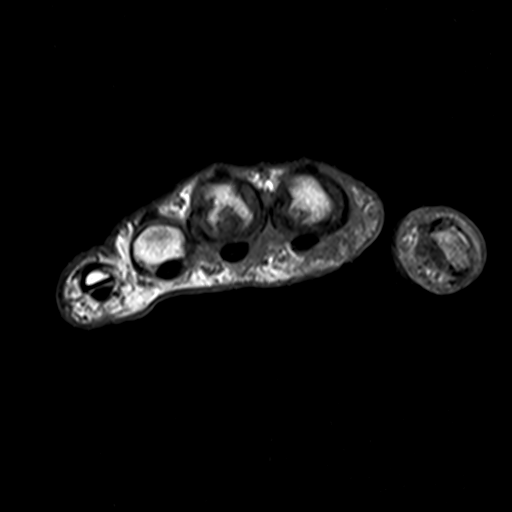
[im 16/32]
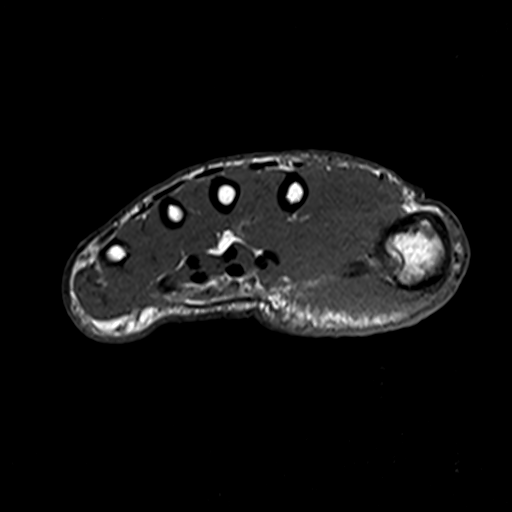
[im 24/32]
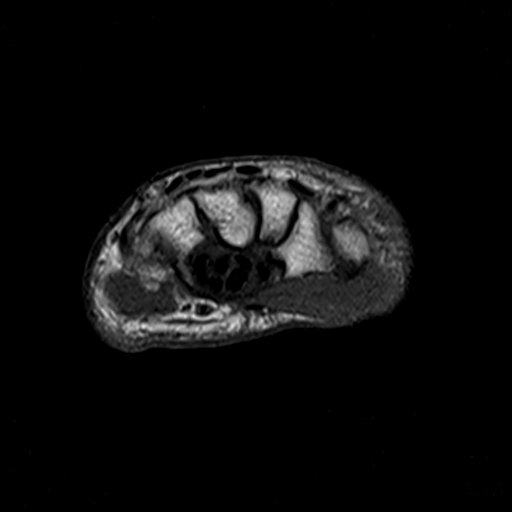
[im 32/32]
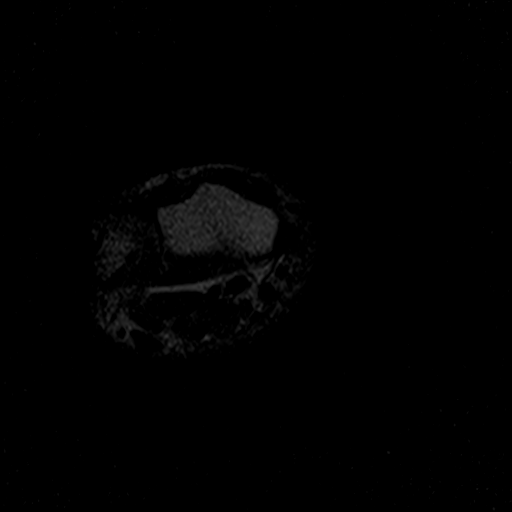

[Series 8: T1 · axial · 4.0mm · 0.28mm/px · z∈[-75,+54]mm · 5 of 32 slices shown (2 of 2)]
[im 1/32]
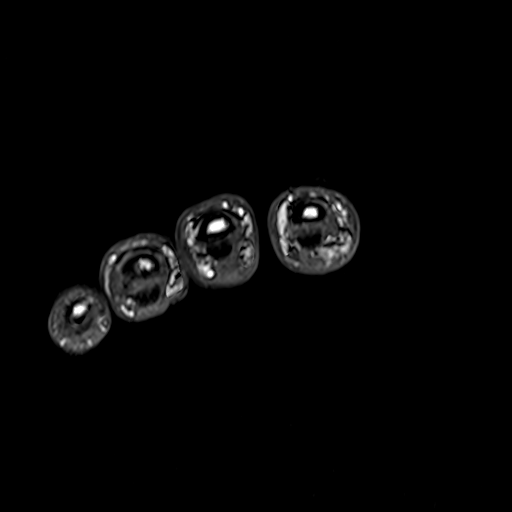
[im 8/32]
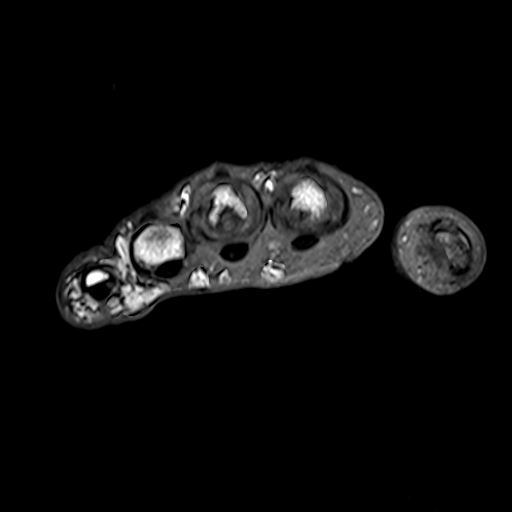
[im 16/32]
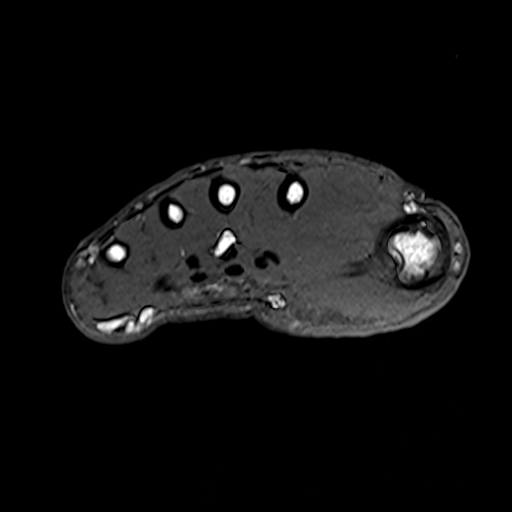
[im 24/32]
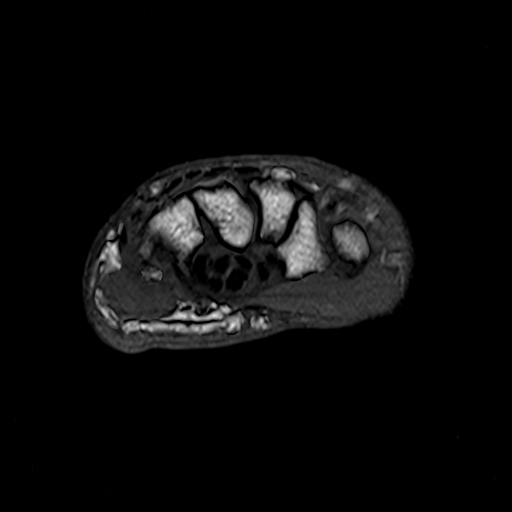
[im 32/32]
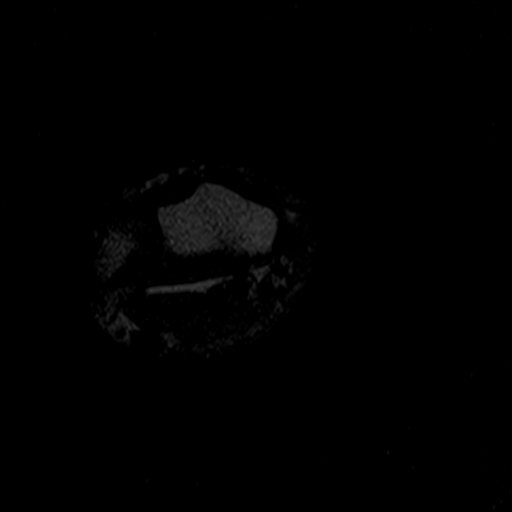

[Series 9: ir sagital · sagittal · 3.0mm · 0.31mm/px · 3 of 38 slices shown]
[im 8/38]
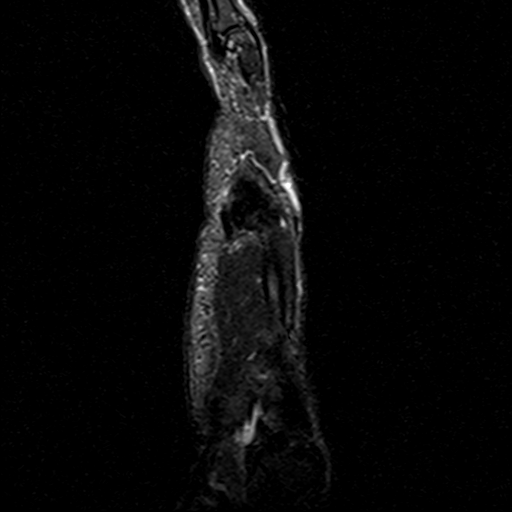
[im 23/38]
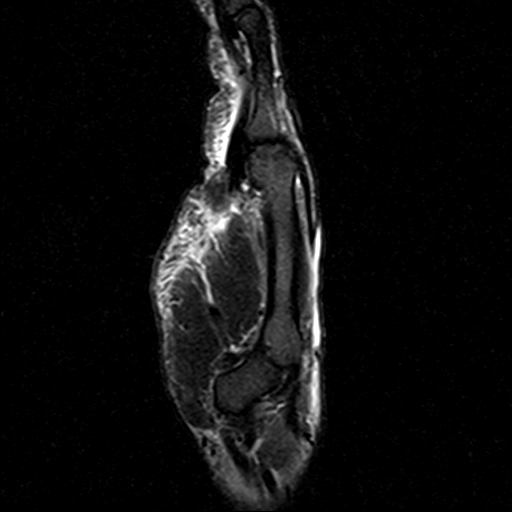
[im 38/38]
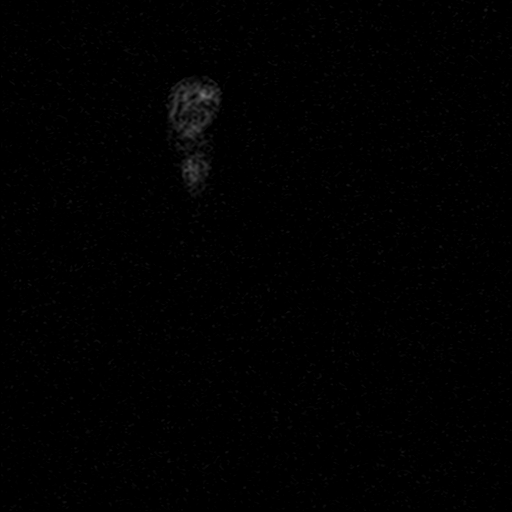

[17 of 40 positions shown; findings below may reference images not displayed]

FINDINGS: Bones/Joint/Cartilage

There is no bone marrow edema or enhancement to suggest
osteomyelitis. Well-circumscribed T2 hyperintense lesions in the
heads of the third and fourth metacarpals are compatible with cysts
or enchondromas. Tiny degenerative cysts in the heads of the first
and second metacarpals are also noted.

Ligaments

Intact.

Muscles and Tendons

There is mild edema and enhancement in the muscles of the thenar
eminence consistent with myositis. No intramuscular abscess is
identified.

Soft tissues

Subcutaneous edema and enhancement are seen centered about the first
MCP joint. In the subcutaneous fatty tissues on the volar surface of
the thumb eccentric toward the radial side, a fluid collection
measuring 1.7 cm transverse by 0.7 cm AP by 1.3 cm craniocaudal is
consistent with an abscess.
IMPRESSION: Cellulitis centered at the thenar eminence with a small abscess in
the subcutaneous fatty tissues along the volar margin of the first
MCP joint on the radial side.

Mild edema and enhancement in the musculature of the thenar eminence
consistent with myositis. No intramuscular abscess.

Negative for osteomyelitis or septic joint.

## 2018-07-12 ENCOUNTER — Other Ambulatory Visit (INDEPENDENT_AMBULATORY_CARE_PROVIDER_SITE_OTHER): Payer: Self-pay | Admitting: Family Medicine

## 2018-12-27 ENCOUNTER — Other Ambulatory Visit: Payer: Self-pay

## 2018-12-27 DIAGNOSIS — Z20822 Contact with and (suspected) exposure to covid-19: Secondary | ICD-10-CM

## 2018-12-28 LAB — NOVEL CORONAVIRUS, NAA: SARS-CoV-2, NAA: NOT DETECTED

## 2018-12-30 ENCOUNTER — Telehealth: Payer: Self-pay | Admitting: *Deleted

## 2018-12-30 NOTE — Telephone Encounter (Signed)
Patient calling for COVID test results- notified negative. Patient was tested due to exposure at party. Patient does not have symptoms. Advised continue safe practices, notify PCP for changes and get flu shot

## 2019-08-04 ENCOUNTER — Encounter: Payer: Self-pay | Admitting: Emergency Medicine

## 2019-08-04 ENCOUNTER — Ambulatory Visit
Admission: EM | Admit: 2019-08-04 | Discharge: 2019-08-04 | Disposition: A | Discharge status: Home / Self Care | Payer: BLUE CROSS/BLUE SHIELD | Attending: Emergency Medicine | Admitting: Emergency Medicine

## 2019-08-04 ENCOUNTER — Other Ambulatory Visit: Payer: Self-pay

## 2019-08-04 DIAGNOSIS — R05 Cough: Secondary | ICD-10-CM | POA: Diagnosis not present

## 2019-08-04 DIAGNOSIS — R059 Cough, unspecified: Secondary | ICD-10-CM

## 2019-08-04 DIAGNOSIS — Z1152 Encounter for screening for COVID-19: Secondary | ICD-10-CM | POA: Diagnosis not present

## 2019-08-04 MED ORDER — BENZONATATE 100 MG PO CAPS: 100.0000 mg | ORAL_CAPSULE | Freq: Three times a day (TID) | ORAL | 0 refills | Status: DC

## 2019-08-04 NOTE — ED Triage Notes (Signed)
Has had some diarrhea, dry cough and fatigue since Saturday.  Pt would like covid test.  Pt states he has not been wearing his mask since the mandate was removed and he has not received the vaccine.

## 2019-08-04 NOTE — ED Provider Notes (Signed)
[facility]    CSN: [id number] Arrival date & time: 08/04/19  6659      History   Chief Complaint Chief Complaint  Patient presents with  . Cough    HPI Grant Yang is a 64 y.o. male.   Who presented to the urgent care for complaint of diarrhea, dry cough, fatigue for the past 2 days.  The diarrhea is now resolved.  Denies sick exposure to COVID, flu or strep.  Denies recent travel.  Denies aggravating or alleviating symptoms.  Denies previous COVID infection.   Denies fever, chills, fatigue, nasal congestion, rhinorrhea, sore throat,  SOB, wheezing, chest pain, nausea, vomiting, changes in bowel or bladder habits.    The history is provided by the patient. No language interpreter was used.  Cough   Past Medical History:  Diagnosis Date  . Cancer [facility])    prostate cancer   . Diabetes mellitus   . GERD (gastroesophageal reflux disease)   . History of kidney stones   . Hypertension   . 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 Shortness of breath    patient states it hurts to breath; this was when he use to smoke cigarettes   . Stroke [facility]) 2010   "mini stroke" patient cant remember when; states it happened in w. [place] in 2010 . denies any residual deficits     Patient Active Problem List   Diagnosis Date Noted  . Prostate cancer ([place]) 01/30/2017  . Cellulitis of left hand 02/01/17 I and D 01/30/2017  . Hypertension 01/30/2017  . Uncontrolled type 2 diabetes mellitus with hyperglycemia ([place]) 01/30/2017  . Hyperbilirubinemia 12/30/2010  . Hyponatremia 12/29/2010  . DM (diabetes mellitus), type 2, uncontrolled ([place]) 12/28/2010  . Acute cholecystitis 12/28/2010  . Noncompliance 12/28/2010    Past Surgical History:  Procedure Laterality Date  . APPENDECTOMY    . CHOLECYSTECTOMY  12/30/2010   Procedure: LAPAROSCOPIC CHOLECYSTECTOMY;  Surgeon: Jamesetta So;  Location: [facility];  Service: General;  Laterality: N/A;  . INCISION  AND DRAINAGE ABSCESS Left 02/01/2017   Procedure: INCISION AND DRAINAGE ABSCESS LEFT HAND;  Surgeon: Carole Civil, MD;  Location: [facility];  Service: Orthopedics;  Laterality: Left;  . LYMPHADENECTOMY Bilateral 08/02/2017   Procedure: LYMPHADENECTOMY, PELVIC;  Surgeon: Raynelle Bring, MD;  Location: [facility];  Service: Urology;  Laterality: Bilateral;  . ROBOT ASSISTED LAPAROSCOPIC RADICAL PROSTATECTOMY N/A 08/02/2017   Procedure: XI ROBOTIC ASSISTED LAPAROSCOPIC RADICAL PROSTATECTOMY LEVEL 2;  Surgeon: Raynelle Bring, MD;  Location: [facility];  Service: Urology;  Laterality: N/A;       Home Medications    Prior to Admission medications   Medication Sig Start Date End Date Taking? Authorizing Provider  benzonatate (TESSALON) 100 MG capsule Take 1 capsule (100 mg total) by mouth every 8 (eight) hours. 08/04/19   Kiran Carline, Darrelyn Hillock, FNP  blood glucose meter kit and supplies Dispense based on patient and insurance preference. Use up to four times daily as directed. (FOR ICD-10 E10.9, E11.9). Patient not taking: Reported on 07/23/2017 02/03/17   Eber Jones, MD  HYDROcodone-acetaminophen (NORCO/VICODIN) 5-325 MG tablet Take 1-2 tablets by mouth every 4 (four) hours as needed for moderate pain or severe pain. 08/02/17   Debbrah Alar, PA-C  insulin NPH-regular Human (NOVOLIN 70/30) (70-30) 100 UNIT/ML injection Inject 10 Units into the skin 2 (two) times daily with a meal. Patient not taking: Reported on 07/23/2017 02/03/17   Eber Jones,  MD  Insulin Syringes, Disposable, U-100 0.3 ML MISC 10 Units by Does not apply route 2 (two) times daily with breakfast and lunch. Patient not taking: Reported on 07/23/2017 02/03/17   Eber Jones, MD  JANUMET 50-1000 MG tablet Take 1 tablet by mouth daily. 07/18/17   [provider]  metFORMIN (GLUCOPHAGE) 500 MG tablet Take 1 tablet (500 mg total) by mouth 2 (two) times daily with a meal. Patient not taking: Reported on 07/23/2017  02/03/17 03/05/17  Eber Jones, MD  sulfamethoxazole-trimethoprim (BACTRIM DS,SEPTRA DS) 800-160 MG tablet Take 1 tablet by mouth 2 (two) times daily. Start the day prior to foley removal appointment 08/02/17   Debbrah Alar, PA-C    Family History Family History  Problem Relation Age of Onset  . Heart failure Mother   . Cancer Father     Social History Social History   Tobacco Use  . Smoking status: Former Smoker    Quit date: 2010    Years since quitting: 11.4  . Smokeless tobacco: Never Used  Substance Use Topics  . Alcohol use: No  . Drug use: Yes    Types: Marijuana    Comment: occ     Allergies   Penicillins   Review of Systems Review of Systems  Constitutional: Positive for fatigue.  HENT: Negative.   Respiratory: Positive for cough.   Cardiovascular: Negative.   Gastrointestinal: Positive for diarrhea.  Neurological: Negative.   All other systems reviewed and are negative.    Physical Exam Triage Vital Signs ED Triage Vitals  Enc Vitals Group     BP 08/04/19 0925 (!) 149/83     Pulse Rate 08/04/19 0925 69     Resp 08/04/19 0925 17     Temp 08/04/19 0925 98 F (36.7 C)     Temp Source 08/04/19 0925 Oral     SpO2 08/04/19 0925 97 %     Weight 08/04/19 0927 211 lb (95.7 kg)     Height 08/04/19 0927 _0  (1.803 m)     Head Circumference --      Peak Flow --      Pain Score 08/04/19 0926 0     Pain Loc --      Pain Edu? --      Excl. in [place]? --    No data found.  Updated Vital Signs BP (!) 149/83 (BP Location: Right Arm)   Pulse 69   Temp 98 F (36.7 C) (Oral)   Resp 17   Ht _1  (1.803 m)   Wt 211 lb (95.7 kg)   SpO2 97%   BMI 29.43 kg/m   Visual Acuity Right Eye Distance:   Left Eye Distance:   Bilateral Distance:    Right Eye Near:   Left Eye Near:    Bilateral Near:     Physical Exam Vitals and nursing note reviewed.  Constitutional:      General: He is not in acute distress.    Appearance: Normal appearance.  He is normal weight. He is not ill-appearing or toxic-appearing.  HENT:     Head: Normocephalic.     Right Ear: Tympanic membrane, ear canal and external ear normal. There is no impacted cerumen.     Left Ear: Tympanic membrane, ear canal and external ear normal. There is no impacted cerumen.     Nose: Nose normal. No congestion.     Mouth/Throat:     Mouth: Mucous membranes are moist.  Pharynx: Oropharynx is clear. No oropharyngeal exudate or posterior oropharyngeal erythema.  Cardiovascular:     Rate and Rhythm: Normal rate and regular rhythm.     Pulses: Normal pulses.     Heart sounds: Normal heart sounds. No murmur heard.   Pulmonary:     Effort: Pulmonary effort is normal. No respiratory distress.     Breath sounds: Normal breath sounds. No wheezing or rhonchi.  Chest:     Chest wall: No tenderness.  Neurological:     Mental Status: He is alert and oriented to person, place, and time.      UC Treatments / Results  Labs (all labs ordered are listed, but only abnormal results are displayed) Labs Reviewed  NOVEL CORONAVIRUS, NAA    EKG   Radiology No results found.  Procedures Procedures (including critical care time)  Medications Ordered in UC Medications - No data to display  Initial Impression / Assessment and Plan / UC Course  I have reviewed the triage vital signs and the nursing notes.  Pertinent labs & imaging results that were available during my care of the patient were reviewed by me and considered in my medical decision making (see chart for details).   Patient is stable at discharge.  COVID-19 test was completed patient was advised to quarantine until results become available.  Tessalon Perles prescribed for cough.  Work note was given.  Final Clinical Impressions(s) / UC Diagnoses   Final diagnoses:  Cough  Encounter for screening for COVID-19     Discharge Instructions     COVID testing ordered.  It will take between 2-7 days for test  results.  Someone will contact you regarding abnormal results.    In the meantime: You should remain isolated in your home for 10 days from symptom onset AND greater than 24 hours after symptoms resolution (absence of fever without the use of fever-reducing medication and improvement in respiratory symptoms), whichever is longer Get plenty of rest and push fluids Tessalon Perles prescribed for cough Use medications daily for symptom relief Use OTC medications like ibuprofen or tylenol as needed fever or pain Call or go to the ED if you have any new or worsening symptoms such as fever, worsening cough, shortness of breath, chest tightness, chest pain, turning blue, changes in mental status, etc...     ED Prescriptions    Medication Sig Dispense Auth. Provider   benzonatate (TESSALON) 100 MG capsule Take 1 capsule (100 mg total) by mouth every 8 (eight) hours. 21 capsule Zari Cly, Darrelyn Hillock, FNP     PDMP not reviewed this encounter.   Emerson Monte, FNP 08/04/19 1011

## 2019-08-04 NOTE — Discharge Instructions (Addendum)
COVID testing ordered.  It will take between 2-7 days for test results.  Someone will contact you regarding abnormal results.    In the meantime: You should remain isolated in your home for 10 days from symptom onset AND greater than 24 hours after symptoms resolution (absence of fever without the use of fever-reducing medication and improvement in respiratory symptoms), whichever is longer Get plenty of rest and push fluids Tessalon Perles prescribed for cough Use medications daily for symptom relief Use OTC medications like ibuprofen or tylenol as needed fever or pain Call or go to the ED if you have any new or worsening symptoms such as fever, worsening cough, shortness of breath, chest tightness, chest pain, turning blue, changes in mental status, etc..Marland Kitchen

## 2019-08-05 LAB — NOVEL CORONAVIRUS, NAA: SARS-CoV-2, NAA: NOT DETECTED

## 2019-08-05 LAB — SARS-COV-2, NAA 2 DAY TAT

## 2021-08-30 ENCOUNTER — Ambulatory Visit
Admission: EM | Admit: 2021-08-30 | Discharge: 2021-08-30 | Disposition: A | Discharge status: Home / Self Care | Payer: No Typology Code available for payment source | Attending: Family Medicine | Admitting: Family Medicine

## 2021-08-30 DIAGNOSIS — L03116 Cellulitis of left lower limb: Secondary | ICD-10-CM | POA: Diagnosis not present

## 2021-08-30 DIAGNOSIS — T148XXA Other injury of unspecified body region, initial encounter: Secondary | ICD-10-CM

## 2021-08-30 DIAGNOSIS — Z23 Encounter for immunization: Secondary | ICD-10-CM

## 2021-08-30 MED ORDER — TETANUS-DIPHTH-ACELL PERTUSSIS 5-2.5-18.5 LF-MCG/0.5 IM SUSY
0.5000 mL | PREFILLED_SYRINGE | Freq: Once | INTRAMUSCULAR | Status: AC
  Administered 2021-08-30: 0.5 mL via INTRAMUSCULAR

## 2021-08-30 MED ORDER — SULFAMETHOXAZOLE-TRIMETHOPRIM 800-160 MG PO TABS: 1.0000 | ORAL_TABLET | Freq: Two times a day (BID) | ORAL | 0 refills | Status: DC

## 2021-08-30 NOTE — ED Triage Notes (Signed)
Pt reports he stepped over a metal pice with the left foot 3 days ago. Pt reports swelling, redness and pain in the left foot. Pt requested Tdap.   Pt requested Janumet 50-1000 MG

## 2021-08-30 NOTE — ED Provider Notes (Signed)
[facility]    CSN: [id number] Arrival date & time: 08/30/21  0931      History   Chief Complaint Chief Complaint  Patient presents with   Wound Check   Medication Refill    HPI Grant Yang is a 66 y.o. male.   Presenting today with left foot pain, swelling, redness after stepping on a large piece of metal 3 days ago.  He states there was minimal bleeding and thinks that the sole of his shoe took the majority of the metal object but the skin was broken.  He states his foot has become increasingly more painful, swollen, red since the incident.  He denies drainage, fever, chills, numbness, tingling, decreased range of motion.  Has been keeping it clean and applying Neosporin.  Has a history of diabetes, currently insulin-dependent.  Thinks he is due for a tetanus shot.  Does not recall his last date but thinks it was more than 10 years.    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 [place] in 2010 . denies any residual deficits     Patient Active Problem List   Diagnosis Date Noted   Prostate cancer ([place]) 01/30/2017   Cellulitis of left hand 02/01/17 I and D 01/30/2017   Hypertension 01/30/2017   Uncontrolled type 2 diabetes mellitus with hyperglycemia ([place]) 01/30/2017   Hyperbilirubinemia 12/30/2010   Hyponatremia 12/29/2010   DM (diabetes mellitus), type 2, uncontrolled ([place]) 12/28/2010   Acute cholecystitis 12/28/2010   Noncompliance 12/28/2010    Past Surgical History:  Procedure Laterality Date   APPENDECTOMY     CHOLECYSTECTOMY  12/30/2010   Procedure:  LAPAROSCOPIC CHOLECYSTECTOMY;  Surgeon: Jamesetta So;  Location[facility];  Service: General;  Laterality: N/A;   INCISION AND DRAINAGE ABSCESS Left 02/01/2017   Procedure: INCISION AND DRAINAGE ABSCESS LEFT HAND;  Surgeon: Carole Civil, MD;  Location: [facility];  Service: Orthopedics;  Laterality: Left;   LYMPHADENECTOMY Bilateral 08/02/2017   Procedure: LYMPHADENECTOMY, PELVIC;  Surgeon: Raynelle Bring, MD;  [facility];  Service: Urology;  Laterality: Bilateral;   ROBOT ASSISTED LAPAROSCOPIC RADICAL PROSTATECTOMY N/A 08/02/2017   Procedure: XI ROBOTIC ASSISTED LAPAROSCOPIC RADICAL PROSTATECTOMY LEVEL 2;  Surgeon: Raynelle Bring, MD;  [facility];  Service: Urology;  Laterality: N/A;       Home Medications    Prior to Admission medications   Medication Sig Start Date End Date Taking? Authorizing Provider  benzonatate (TESSALON) 100 MG capsule Take 1 capsule (100 mg total) by mouth every 8 (eight) hours. 08/04/19   Avegno, Darrelyn Hillock, FNP  blood glucose meter kit and supplies Dispense based on patient and insurance preference. Use up to four times daily as directed. (FOR ICD-10 E10.9, E11.9). 02/03/17   Laqueta Linden, MD  HYDROcodone-acetaminophen (NORCO/VICODIN) 5-325 MG tablet Take 1-2 tablets by mouth every 4 (four) hours as needed for moderate pain or severe pain. 08/02/17   Debbrah Alar, PA-C  insulin NPH-regular Human (NOVOLIN 70/30) (70-30) 100 UNIT/ML injection  Inject 10 Units into the skin 2 (two) times daily with a meal. 02/03/17   Laqueta Linden, MD  Insulin Syringes, Disposable, U-100 0.3 ML MISC 10 Units by Does not apply route 2 (two) times daily with breakfast and lunch. Patient not taking: Reported on 07/23/2017 02/03/17   Laqueta Linden, MD  JANUMET 50-1000 MG tablet Take 1 tablet by mouth daily. 07/18/17   [provider]  metFORMIN (GLUCOPHAGE) 500 MG tablet Take 1 tablet (500 mg total) by mouth 2 (two) times daily with a meal. Patient  not taking: Reported on 07/23/2017 02/03/17 03/05/17  Laqueta Linden, MD  sulfamethoxazole-trimethoprim (BACTRIM DS) 800-160 MG tablet Take 1 tablet by mouth 2 (two) times daily. 08/30/21   Volney American, PA-C    Family History Family History  Problem Relation Age of Onset   Heart failure Mother    Cancer Father     Social History Social History   Tobacco Use   Smoking status: Former    Types: Cigarettes    Quit date: 2010    Years since quitting: 13.5   Smokeless tobacco: Never  Substance Use Topics   Alcohol use: No   Drug use: Yes    Types: Marijuana    Comment: occ     Allergies   Penicillins   Review of Systems Review of Systems Per HPI  Physical Exam Triage Vital Signs ED Triage Vitals  Enc Vitals Group     BP 08/30/21 1012 (!) 146/75     Pulse Rate 08/30/21 1012 71     Resp 08/30/21 1012 18     Temp 08/30/21 1012 97.7 F (36.5 C)     Temp Source 08/30/21 1012 Oral     SpO2 08/30/21 1012 96 %     Weight --      Height --      Head Circumference --      Peak Flow --      Pain Score 08/30/21 1010 5     Pain Loc --      Pain Edu? --      Excl. in [place]? --    No data found.  Updated Vital Signs BP (!) 146/75 (BP Location: Right Arm)   Pulse 71   Temp 97.7 F (36.5 C) (Oral)   Resp 18   SpO2 96%   Visual Acuity Right Eye Distance:   Left Eye Distance:   Bilateral Distance:    Right Eye Near:   Left Eye Near:    Bilateral Near:     Physical Exam Vitals and nursing note reviewed.  Constitutional:      Appearance: Normal appearance.  HENT:     Head: Atraumatic.  Eyes:     Extraocular Movements: Extraocular movements intact.     Conjunctiva/sclera: Conjunctivae normal.  Cardiovascular:     Rate and Rhythm: Normal rate and regular rhythm.  Pulmonary:     Effort: Pulmonary effort is normal.     Breath sounds: Normal breath sounds.  Musculoskeletal:        General: Swelling, tenderness and signs of injury present. Normal  range of motion.     Cervical back: Normal range of motion and neck supple.  Skin:    General: Skin is warm.     Findings: Erythema present.     Comments: Erythematous healing laceration to posterior left foot with surrounding erythema, edema.  Neurological:     General: No focal deficit present.     Mental  Status: He is oriented to person, place, and time.     Comments: Left foot neurovascularly intact  Psychiatric:        Mood and Affect: Mood normal.        Thought Content: Thought content normal.        Judgment: Judgment normal.    UC Treatments / Results  Labs (all labs ordered are listed, but only abnormal results are displayed) Labs Reviewed - No data to display  EKG   Radiology No results found.  Procedures Procedures (including critical care time)  Medications Ordered in UC Medications  Tdap (BOOSTRIX) injection 0.5 mL (0.5 mLs Intramuscular Given 08/30/21 1037)    Initial Impression / Assessment and Plan / UC Course  I have reviewed the triage vital signs and the nursing notes.  Pertinent labs & imaging results that were available during my care of the patient were reviewed by me and considered in my medical decision making (see chart for details).     Tdap updated, foot appears to be becoming infected.  Given puncture wound and erythema, edema will start Bactrim and discussed Epsom salt soaks, elevation, ice, home wound care.  Return for worsening symptoms.  Final Clinical Impressions(s) / UC Diagnoses   Final diagnoses:  Cellulitis of left foot  Puncture wound   Discharge Instructions   None    ED Prescriptions     Medication Sig Dispense Auth. Provider   sulfamethoxazole-trimethoprim (BACTRIM DS) 800-160 MG tablet  (Status: Discontinued) Take 1 tablet by mouth 2 (two) times daily. Start the day prior to foley removal appointment 14 tablet Volney American, [place]   sulfamethoxazole-trimethoprim (BACTRIM DS) 800-160 MG tablet Take 1 tablet by  mouth 2 (two) times daily. 14 tablet Volney American, [place]      PDMP not reviewed this encounter.   Volney American, [place] 08/30/21 1806

## 2021-09-20 ENCOUNTER — Ambulatory Visit (INDEPENDENT_AMBULATORY_CARE_PROVIDER_SITE_OTHER): Payer: No Typology Code available for payment source | Admitting: Orthopedic Surgery

## 2021-09-20 DIAGNOSIS — L02612 Cutaneous abscess of left foot: Secondary | ICD-10-CM

## 2021-09-20 MED ORDER — DOXYCYCLINE HYCLATE 100 MG PO TABS: 100.0000 mg | ORAL_TABLET | Freq: Two times a day (BID) | ORAL | 0 refills | Status: DC

## 2021-09-21 ENCOUNTER — Encounter: Payer: Self-pay | Admitting: Orthopedic Surgery

## 2021-09-21 NOTE — Progress Notes (Signed)
Office Visit Note   Patient: Grant Yang           Date of Birth: Aug 23, 1955           MRN: [id number] Visit Date: 09/20/2021              Requested by: Celene Squibb, MD [place],  [place] [address] PCP: Celene Squibb, MD  Chief Complaint  Patient presents with   Left Foot - Wound Check      HPI: Patient is a 66 year old gentleman who stepped on a dirty fastener screw.  Patient initially went to the emergency room and received a tetanus shot.  Patient complains of increasing pain redness and swelling of the left foot.  Assessment & Plan: Visit Diagnoses:  1. Cutaneous abscess of left foot     Plan: Abscess was decompressed fluid sent for cultures we will start him on doxycycline.  Plan to follow-up on Thursday may need further debridement on Friday in the operating room.  Follow-Up Instructions: Return in about 1 week (around 09/27/2021) for Follow-up Thursday.   Ortho Exam  Patient is alert, oriented, no adenopathy, well-dressed, normal affect, normal respiratory effort. Examination patient does not have a palpable pulse he has a biphasic dorsalis pedis and posterior tibial pulse.  He has cellulitis involving the foot with an ulcer beneath the second metatarsal head.  After informed consent the dorsal and plantar aspect of the abscess was prepped using Betadine.  The skin was incised with a 10 blade knife there was a purulent ulcer beneath the second metatarsal head that probes to bone with a Q-tip.  This area of abscess extended dorsally the skin was incised dorsally and the acute trip went through and through the foot.  This was decompressed and wrapped open with a dry dressing.  Most recent hemoglobin A1c was in 2018 value of 10.4.  Imaging: No results found. No images are attached to the encounter.  Labs: Lab Results  Component Value Date   HGBA1C 10.4 (H) 01/30/2017   HGBA1C 12.3 (H) 12/28/2010   ESRSEDRATE 17 (H) 01/30/2017   CRP 6.6 (H)  01/30/2017   REPTSTATUS 02/06/2017 FINAL 02/01/2017   GRAMSTAIN  02/01/2017    MODERATE WBC PRESENT,BOTH PMN AND MONONUCLEAR FEW GRAM POSITIVE COCCI IN PAIRS AND CHAINS    CULT  02/01/2017    MODERATE VIRIDANS STREPTOCOCCUS NO ANAEROBES ISOLATED Performed at [facility], [place] [address], [place],  [address]      Lab Results  Component Value Date   ALBUMIN 3.1 (L) 02/03/2017   ALBUMIN 3.1 (L) 01/31/2017   ALBUMIN 4.0 01/30/2017    Lab Results  Component Value Date   MG 1.9 12/29/2010   No results found for: "VD25OH"  No results found for: "PREALBUMIN"    Latest Ref Rng & Units 08/03/2017    5:24 AM 08/02/2017    1:32 PM 07/27/2017    3:04 PM  CBC EXTENDED  WBC 4.0 - 10.5 K/uL   9.1   RBC 4.22 - 5.81 MIL/uL   4.79   Hemoglobin 13.0 - 17.0 g/dL 13.3  14.3  14.3   HCT 39.0 - 52.0 % 39.5  41.5  42.1   Platelets 150 - 400 K/uL   267      There is no height or weight on file to calculate BMI.  Orders:  Orders Placed This Encounter  Procedures   Wound culture   Meds ordered this encounter  Medications   doxycycline (VIBRA-TABS) 100 MG tablet    Sig: Take 1 tablet (100 mg total) by mouth 2 (two) times daily.    Dispense:  60 tablet    Refill:  0     Procedures: No procedures performed  Clinical Data: No additional findings.  ROS:  All other systems negative, except as noted in the HPI. Review of Systems  Objective: Vital Signs: There were no vitals taken for this visit.  Specialty Comments:  No specialty comments available.  PMFS History: Patient Active Problem List   Diagnosis Date Noted   Prostate cancer ([place]) 01/30/2017   Cellulitis of left hand 02/01/17 I and D 01/30/2017   Hypertension 01/30/2017   Uncontrolled type 2 diabetes mellitus with hyperglycemia ([place]) 01/30/2017   Hyperbilirubinemia 12/30/2010   Hyponatremia 12/29/2010   DM (diabetes mellitus), type 2, uncontrolled ([place]) 12/28/2010   Acute cholecystitis 12/28/2010    Noncompliance 12/28/2010   Past Medical History:  Diagnosis Date   Cancer [facility])    prostate cancer    Diabetes mellitus    GERD (gastroesophageal reflux disease)    History of kidney stones    Hypertension    Neuropathy in diabetes ([place])    bilateral lower extremities    Shortness of breath    patient states it hurts to breath; this was when he use to smoke cigarettes    Stroke [facility]) 2010   "mini stroke" patient cant remember when; states it happened in w. [place] in 2010 . denies any residual deficits     Family History  Problem Relation Age of Onset   Heart failure Mother    Cancer Father     Past Surgical History:  Procedure Laterality Date   APPENDECTOMY     CHOLECYSTECTOMY  12/30/2010   Procedure: LAPAROSCOPIC CHOLECYSTECTOMY;  Surgeon: Jamesetta So;  [facility];  Service: General;  Laterality: N/A;   INCISION AND DRAINAGE ABSCESS Left 02/01/2017   Procedure: INCISION AND DRAINAGE ABSCESS LEFT HAND;  Surgeon: Carole Civil, MD;  Location: [facility];  Service: Orthopedics;  Laterality: Left;   LYMPHADENECTOMY Bilateral 08/02/2017   Procedure: LYMPHADENECTOMY, PELVIC;  Surgeon: Raynelle Bring, MD;  [facility];  Service: Urology;  Laterality: Bilateral;   ROBOT ASSISTED LAPAROSCOPIC RADICAL PROSTATECTOMY N/A 08/02/2017   Procedure: XI ROBOTIC ASSISTED LAPAROSCOPIC RADICAL PROSTATECTOMY LEVEL 2;  Surgeon: Raynelle Bring, MD;  [facility];  Service: Urology;  Laterality: N/A;   Social History   Occupational History   Not on file  Tobacco Use   Smoking status: Former    Types: Cigarettes    Quit date: 2010    Years since quitting: 13.5   Smokeless tobacco: Never  Substance and Sexual Activity   Alcohol use: No   Drug use: Yes    Types: Marijuana    Comment: occ   Sexual activity: Not on file

## 2021-09-22 ENCOUNTER — Ambulatory Visit (INDEPENDENT_AMBULATORY_CARE_PROVIDER_SITE_OTHER): Payer: No Typology Code available for payment source | Admitting: Orthopedic Surgery

## 2021-09-22 DIAGNOSIS — L02612 Cutaneous abscess of left foot: Secondary | ICD-10-CM

## 2021-09-23 ENCOUNTER — Encounter: Payer: Self-pay | Admitting: Orthopedic Surgery

## 2021-09-23 ENCOUNTER — Telehealth: Payer: Self-pay | Admitting: Orthopedic Surgery

## 2021-09-23 LAB — WOUND CULTURE
MICRO NUMBER:: 13720624
SPECIMEN QUALITY:: ADEQUATE

## 2021-09-23 NOTE — Progress Notes (Signed)
Office Visit Note   Patient: Grant Yang           Date of Birth: June 17, 1955           MRN: [id number] Visit Date: 09/22/2021              Requested by: Celene Squibb, MD [place],  [place] [address] PCP: Celene Squibb, MD  Chief Complaint  Patient presents with   Left Foot - Wound Check      HPI: Patient is a 66 year old gentleman who is seen status post decompression and debridement of an abscess left foot.  Cultures are negative to date.  Patient states he started his Bactrim DS yesterday.  Assessment & Plan: Visit Diagnoses:  1. Cutaneous abscess of left foot     Plan: Continue the Bactrim DS and follow-up Monday.  Discussed that we may still need to consider surgical debridement.  Follow-Up Instructions: Return in about 1 week (around 09/29/2021).   Ortho Exam  Patient is alert, oriented, no adenopathy, well-dressed, normal affect, normal respiratory effort. Patient states that he is no longer painful after the initial debridement.  There is some clear serosanguineous drainage no cellulitis no purulence there is no pain to palpation of the plantar or dorsal aspect of the foot the cellulitis has resolved.  Patient's previous lab values show an elevated hemoglobin A1c.  Patient states he does not like taking diabetes medicine.  Imaging: No results found. No images are attached to the encounter.  Labs: Lab Results  Component Value Date   HGBA1C 10.4 (H) 01/30/2017   HGBA1C 12.3 (H) 12/28/2010   ESRSEDRATE 17 (H) 01/30/2017   CRP 6.6 (H) 01/30/2017   REPTSTATUS 02/06/2017 FINAL 02/01/2017   GRAMSTAIN  02/01/2017    MODERATE WBC PRESENT,BOTH PMN AND MONONUCLEAR FEW GRAM POSITIVE COCCI IN PAIRS AND CHAINS    CULT  02/01/2017    MODERATE VIRIDANS STREPTOCOCCUS NO ANAEROBES ISOLATED Performed at [facility], [place] [address] [place], [place] [address]      Lab Results  Component Value Date   ALBUMIN 3.1 (L) 02/03/2017   ALBUMIN 3.1  (L) 01/31/2017   ALBUMIN 4.0 01/30/2017    Lab Results  Component Value Date   MG 1.9 12/29/2010   No results found for: "VD25OH"  No results found for: "PREALBUMIN"    Latest Ref Rng & Units 08/03/2017    5:24 AM 08/02/2017    1:32 PM 07/27/2017    3:04 PM  CBC EXTENDED  WBC 4.0 - 10.5 K/uL   9.1   RBC 4.22 - 5.81 MIL/uL   4.79   Hemoglobin 13.0 - 17.0 g/dL 13.3  14.3  14.3   HCT 39.0 - 52.0 % 39.5  41.5  42.1   Platelets 150 - 400 K/uL   267      There is no height or weight on file to calculate BMI.  Orders:  No orders of the defined types were placed in this encounter.  No orders of the defined types were placed in this encounter.    Procedures: No procedures performed  Clinical Data: No additional findings.  ROS:  All other systems negative, except as noted in the HPI. Review of Systems  Objective: Vital Signs: There were no vitals taken for this visit.  Specialty Comments:  No specialty comments available.  PMFS History: Patient Active Problem List   Diagnosis Date Noted   Prostate cancer ([place]) 01/30/2017   Cellulitis  of left hand 02/01/17 I and D 01/30/2017   Hypertension 01/30/2017   Uncontrolled type 2 diabetes mellitus with hyperglycemia (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 [place] in 2010 . denies any residual deficits     Family History   Problem Relation Age of Onset   Heart failure Mother    Cancer Father     Past Surgical History:  Procedure Laterality Date   APPENDECTOMY     CHOLECYSTECTOMY  12/30/2010   Procedure: LAPAROSCOPIC CHOLECYSTECTOMY;  Surgeon: Jamesetta So;  Location[facility];  Service: General;  Laterality: N/A;   INCISION AND DRAINAGE ABSCESS Left 02/01/2017   Procedure: INCISION AND DRAINAGE ABSCESS LEFT HAND;  Surgeon: Carole Civil, MD;  Location: [facility];  Service: Orthopedics;  Laterality: Left;   LYMPHADENECTOMY Bilateral 08/02/2017   Procedure: LYMPHADENECTOMY, PELVIC;  Surgeon: Raynelle Bring, MD;  [facility];  Service: Urology;  Laterality: Bilateral;   ROBOT ASSISTED LAPAROSCOPIC RADICAL PROSTATECTOMY N/A 08/02/2017   Procedure: XI ROBOTIC ASSISTED LAPAROSCOPIC RADICAL PROSTATECTOMY LEVEL 2;  Surgeon: Raynelle Bring, MD;  [facility];  Service: Urology;  Laterality: N/A;   Social History   Occupational History   Not on file  Tobacco Use   Smoking status: Former    Types: Cigarettes    Quit date: 2010    Years since quitting: 13.5   Smokeless tobacco: Never  Substance and Sexual Activity   Alcohol use: No   Drug use: Yes    Types: Marijuana    Comment: occ   Sexual activity: Not on file

## 2021-09-23 NOTE — Telephone Encounter (Signed)
Called patient concerning his insurance coverage [employer] OON. I advised patient that his insurance is out of Network with [place] and he will owe more OOP because we are OON.  I advised patient that I spoke with Anne Ng in [employer] and she informed me of his OON coverage. 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

## 2021-09-26 ENCOUNTER — Ambulatory Visit (INDEPENDENT_AMBULATORY_CARE_PROVIDER_SITE_OTHER): Payer: No Typology Code available for payment source | Admitting: Orthopedic Surgery

## 2021-09-26 DIAGNOSIS — L02612 Cutaneous abscess of left foot: Secondary | ICD-10-CM

## 2021-09-27 ENCOUNTER — Encounter: Payer: Self-pay | Admitting: Orthopedic Surgery

## 2021-09-27 NOTE — Progress Notes (Signed)
Office Visit Note   Patient: Grant Yang           Date of Birth: Sep 13, 1955           MRN: [id number] Visit Date: 09/26/2021              Requested by: Celene Squibb, MD [place],  [place] [address] PCP: Celene Squibb, MD  Chief Complaint  Patient presents with   Left Foot - Follow-up      HPI: Patient is a 66 year old gentleman who presents in follow-up 1 week status post decompression of an abscess left foot.  Patient is currently cleansing with soap and water states he has some clear drainage.  He is on antibiotics he states that it is not painful and the cellulitis has resolved.  Assessment & Plan: Visit Diagnoses:  1. Cutaneous abscess of left foot     Plan: Continue antibiotics and Dial soap cleansing  Follow-Up Instructions: Return in about 2 weeks (around 10/10/2021).   Ortho Exam  Patient is alert, oriented, no adenopathy, well-dressed, normal affect, normal respiratory effort. Examination the cellulitis has resolved there is some swelling there is no tenderness to palpation.  There is a small amount of clear serous drainage patient is currently on doxycycline.  Imaging: No results found. No images are attached to the encounter.  Labs: Lab Results  Component Value Date   HGBA1C 10.4 (H) 01/30/2017   HGBA1C 12.3 (H) 12/28/2010   ESRSEDRATE 17 (H) 01/30/2017   CRP 6.6 (H) 01/30/2017   REPTSTATUS 02/06/2017 FINAL 02/01/2017   GRAMSTAIN  02/01/2017    MODERATE WBC PRESENT,BOTH PMN AND MONONUCLEAR FEW GRAM POSITIVE COCCI IN PAIRS AND CHAINS    CULT  02/01/2017    MODERATE VIRIDANS STREPTOCOCCUS NO ANAEROBES ISOLATED Performed at [facility], [place] [address] [place], [place] [address]      Lab Results  Component Value Date   ALBUMIN 3.1 (L) 02/03/2017   ALBUMIN 3.1 (L) 01/31/2017   ALBUMIN 4.0 01/30/2017    Lab Results  Component Value Date   MG 1.9 12/29/2010   No results found for: "VD25OH"  No results found for:  "PREALBUMIN"    Latest Ref Rng & Units 08/03/2017    5:24 AM 08/02/2017    1:32 PM 07/27/2017    3:04 PM  CBC EXTENDED  WBC 4.0 - 10.5 K/uL   9.1   RBC 4.22 - 5.81 MIL/uL   4.79   Hemoglobin 13.0 - 17.0 g/dL 13.3  14.3  14.3   HCT 39.0 - 52.0 % 39.5  41.5  42.1   Platelets 150 - 400 K/uL   267      There is no height or weight on file to calculate BMI.  Orders:  No orders of the defined types were placed in this encounter.  No orders of the defined types were placed in this encounter.    Procedures: No procedures performed  Clinical Data: No additional findings.  ROS:  All other systems negative, except as noted in the HPI. Review of Systems  Objective: Vital Signs: There were no vitals taken for this visit.  Specialty Comments:  No specialty comments available.  PMFS History: Patient Active Problem List   Diagnosis Date Noted   Prostate cancer ([place]) 01/30/2017   Cellulitis of left hand 02/01/17 I and D 01/30/2017   Hypertension 01/30/2017   Uncontrolled type 2 diabetes mellitus with hyperglycemia ([place]) 01/30/2017   Hyperbilirubinemia 12/30/2010  Hyponatremia 12/29/2010   DM (diabetes mellitus), type 2, uncontrolled ([place]) 12/28/2010   Acute cholecystitis 12/28/2010   Noncompliance 12/28/2010   Past Medical History:  Diagnosis Date   Cancer [facility])    prostate cancer    Diabetes mellitus    GERD (gastroesophageal reflux disease)    History of kidney stones    Hypertension    Neuropathy in diabetes [facility])    bilateral lower extremities    Shortness of breath    patient states it hurts to breath; this was when he use to smoke cigarettes    Stroke [facility]) 2010   "mini stroke" patient cant remember when; states it happened in w. [place] in 2010 . denies any residual deficits     Family History  Problem Relation Age of Onset   Heart failure Mother    Cancer Father     Past Surgical History:  Procedure Laterality Date   APPENDECTOMY     CHOLECYSTECTOMY   12/30/2010   Procedure: LAPAROSCOPIC CHOLECYSTECTOMY;  Surgeon: Jamesetta So;  Location[facility];  Service: General;  Laterality: N/A;   INCISION AND DRAINAGE ABSCESS Left 02/01/2017   Procedure: INCISION AND DRAINAGE ABSCESS LEFT HAND;  Surgeon: Carole Civil, MD;  Location: [facility];  Service: Orthopedics;  Laterality: Left;   LYMPHADENECTOMY Bilateral 08/02/2017   Procedure: LYMPHADENECTOMY, PELVIC;  Surgeon: Raynelle Bring, MD;  [facility];  Service: Urology;  Laterality: Bilateral;   ROBOT ASSISTED LAPAROSCOPIC RADICAL PROSTATECTOMY N/A 08/02/2017   Procedure: XI ROBOTIC ASSISTED LAPAROSCOPIC RADICAL PROSTATECTOMY LEVEL 2;  Surgeon: Raynelle Bring, MD;  [facility];  Service: Urology;  Laterality: N/A;   Social History   Occupational History   Not on file  Tobacco Use   Smoking status: Former    Types: Cigarettes    Quit date: 2010    Years since quitting: 13.6   Smokeless tobacco: Never  Substance and Sexual Activity   Alcohol use: No   Drug use: Yes    Types: Marijuana    Comment: occ   Sexual activity: Not on file

## 2021-09-29 ENCOUNTER — Ambulatory Visit: Payer: 59 | Admitting: Orthopedic Surgery

## 2021-10-10 ENCOUNTER — Ambulatory Visit: Payer: 59 | Admitting: Orthopedic Surgery

## 2022-05-29 ENCOUNTER — Emergency Department (HOSPITAL_COMMUNITY): Payer: 59

## 2022-05-29 ENCOUNTER — Encounter (HOSPITAL_COMMUNITY): Payer: Self-pay

## 2022-05-29 ENCOUNTER — Encounter: Payer: Self-pay | Admitting: Internal Medicine

## 2022-05-29 ENCOUNTER — Ambulatory Visit: Payer: 59 | Admitting: Internal Medicine

## 2022-05-29 ENCOUNTER — Inpatient Hospital Stay (HOSPITAL_COMMUNITY)
Admission: EM | Admit: 2022-05-29 | Discharge: 2022-06-02 | DRG: 617 | Disposition: A | Discharge status: Home / Self Care | Payer: 59 | Attending: Internal Medicine | Admitting: Internal Medicine

## 2022-05-29 ENCOUNTER — Other Ambulatory Visit: Payer: Self-pay

## 2022-05-29 VITALS — BP 170/84 | HR 81 | Ht 71.0 in | Wt 208.1 lb

## 2022-05-29 DIAGNOSIS — M86172 Other acute osteomyelitis, left ankle and foot: Secondary | ICD-10-CM | POA: Diagnosis present

## 2022-05-29 DIAGNOSIS — Z7984 Long term (current) use of oral hypoglycemic drugs: Secondary | ICD-10-CM

## 2022-05-29 DIAGNOSIS — Z8546 Personal history of malignant neoplasm of prostate: Secondary | ICD-10-CM

## 2022-05-29 DIAGNOSIS — E1165 Type 2 diabetes mellitus with hyperglycemia: Secondary | ICD-10-CM | POA: Diagnosis not present

## 2022-05-29 DIAGNOSIS — Z79899 Other long term (current) drug therapy: Secondary | ICD-10-CM

## 2022-05-29 DIAGNOSIS — M869 Osteomyelitis, unspecified: Principal | ICD-10-CM

## 2022-05-29 DIAGNOSIS — L03116 Cellulitis of left lower limb: Secondary | ICD-10-CM

## 2022-05-29 DIAGNOSIS — Z8673 Personal history of transient ischemic attack (TIA), and cerebral infarction without residual deficits: Secondary | ICD-10-CM

## 2022-05-29 DIAGNOSIS — J449 Chronic obstructive pulmonary disease, unspecified: Secondary | ICD-10-CM

## 2022-05-29 DIAGNOSIS — Z794 Long term (current) use of insulin: Secondary | ICD-10-CM

## 2022-05-29 DIAGNOSIS — L02612 Cutaneous abscess of left foot: Secondary | ICD-10-CM | POA: Diagnosis present

## 2022-05-29 DIAGNOSIS — I10 Essential (primary) hypertension: Secondary | ICD-10-CM | POA: Diagnosis present

## 2022-05-29 DIAGNOSIS — M009 Pyogenic arthritis, unspecified: Secondary | ICD-10-CM | POA: Diagnosis present

## 2022-05-29 DIAGNOSIS — Z89422 Acquired absence of other left toe(s): Secondary | ICD-10-CM | POA: Diagnosis present

## 2022-05-29 DIAGNOSIS — R600 Localized edema: Secondary | ICD-10-CM | POA: Diagnosis not present

## 2022-05-29 DIAGNOSIS — Z87891 Personal history of nicotine dependence: Secondary | ICD-10-CM

## 2022-05-29 DIAGNOSIS — Z9079 Acquired absence of other genital organ(s): Secondary | ICD-10-CM

## 2022-05-29 DIAGNOSIS — M7989 Other specified soft tissue disorders: Secondary | ICD-10-CM | POA: Diagnosis not present

## 2022-05-29 DIAGNOSIS — E1169 Type 2 diabetes mellitus with other specified complication: Secondary | ICD-10-CM | POA: Diagnosis not present

## 2022-05-29 DIAGNOSIS — Z8249 Family history of ischemic heart disease and other diseases of the circulatory system: Secondary | ICD-10-CM

## 2022-05-29 DIAGNOSIS — Z880 Allergy status to penicillin: Secondary | ICD-10-CM

## 2022-05-29 DIAGNOSIS — K219 Gastro-esophageal reflux disease without esophagitis: Secondary | ICD-10-CM | POA: Diagnosis present

## 2022-05-29 DIAGNOSIS — E1142 Type 2 diabetes mellitus with diabetic polyneuropathy: Secondary | ICD-10-CM | POA: Diagnosis present

## 2022-05-29 LAB — COMPREHENSIVE METABOLIC PANEL
ALT: 14 U/L (ref 0–44)
AST: 13 U/L — ABNORMAL LOW (ref 15–41)
Albumin: 3.3 g/dL — ABNORMAL LOW (ref 3.5–5.0)
Alkaline Phosphatase: 75 U/L (ref 38–126)
Anion gap: 7 (ref 5–15)
BUN: 19 mg/dL (ref 8–23)
CO2: 27 mmol/L (ref 22–32)
Calcium: 9.2 mg/dL (ref 8.9–10.3)
Chloride: 101 mmol/L (ref 98–111)
Creatinine, Ser: 0.76 mg/dL (ref 0.61–1.24)
GFR, Estimated: >60 mL/min (ref >60)
Glucose, Bld: 170 mg/dL — ABNORMAL HIGH (ref 70–99)
Potassium: 4 mmol/L (ref 3.5–5.1)
Sodium: 135 mmol/L (ref 135–145)
Total Bilirubin: 0.9 mg/dL (ref 0.3–1.2)
Total Protein: 7.3 g/dL (ref 6.5–8.1)

## 2022-05-29 LAB — CBC WITH DIFFERENTIAL/PLATELET
Abs Immature Granulocytes: 0.05 10*3/uL (ref 0.00–0.07)
Basophils Absolute: 0.1 10*3/uL (ref 0.0–0.1)
Basophils Relative: 1 %
Eosinophils Absolute: 0.6 10*3/uL — ABNORMAL HIGH (ref 0.0–0.5)
Eosinophils Relative: 6 %
HCT: 40.8 % (ref 39.0–52.0)
Hemoglobin: 13.4 g/dL (ref 13.0–17.0)
Immature Granulocytes: 1 %
Lymphocytes Relative: 25 %
Lymphs Abs: 2.7 10*3/uL (ref 0.7–4.0)
MCH: 28.7 pg (ref 26.0–34.0)
MCHC: 32.8 g/dL (ref 30.0–36.0)
MCV: 87.4 fL (ref 80.0–100.0)
Monocytes Absolute: 0.9 10*3/uL (ref 0.1–1.0)
Monocytes Relative: 8 %
Neutro Abs: 6.3 10*3/uL (ref 1.7–7.7)
Neutrophils Relative %: 59 %
Platelets: 382 10*3/uL (ref 150–400)
RBC: 4.67 MIL/uL (ref 4.22–5.81)
RDW: 12.7 % (ref 11.5–15.5)
WBC: 10.6 10*3/uL — ABNORMAL HIGH (ref 4.0–10.5)
nRBC: 0 % (ref 0.0–0.2)

## 2022-05-29 LAB — LACTIC ACID, PLASMA
Lactic Acid, Venous: 0.7 mmol/L (ref 0.5–1.9)
Lactic Acid, Venous: 0.7 mmol/L (ref 0.5–1.9)

## 2022-05-29 MED ORDER — METFORMIN HCL ER 500 MG PO TB24: ORAL_TABLET | ORAL | 1 refills | Status: DC

## 2022-05-29 MED ORDER — GABAPENTIN 300 MG PO CAPS: 300.0000 mg | ORAL_CAPSULE | Freq: Three times a day (TID) | ORAL | 1 refills | Status: DC | PRN

## 2022-05-29 MED ORDER — GADOBUTROL 1 MMOL/ML IV SOLN
10.0000 mL | Freq: Once | INTRAVENOUS | Status: AC | PRN
  Administered 2022-05-29: 10 mL via INTRAVENOUS

## 2022-05-29 MED ORDER — ALBUTEROL SULFATE HFA 108 (90 BASE) MCG/ACT IN AERS: 2.0000 | INHALATION_SPRAY | Freq: Four times a day (QID) | RESPIRATORY_TRACT | 0 refills | Status: AC | PRN

## 2022-05-29 NOTE — Patient Instructions (Signed)
Thank you, [name] for allowing [place] to provide your care today.   I have ordered labs which can be drawn with the blood work done at the hospital.  I am worried about your left leg and would like for you to go to the emergency department for imaging and further evaluation.      Thurmon Fair, M.D.

## 2022-05-29 NOTE — Assessment & Plan Note (Addendum)
Patient has hot and swollen left leg.  Review of previous hemoglobin A1c's show his diabetes has been uncontrolled and he had recent abscess 6 months ago.  I am concerned the patient has possible underlying osteomyelitis and recommended he go to the emergency room for imaging and stat labs.  I have called and spoken to the charge nurse who is aware patient will be coming to [facility].

## 2022-05-29 NOTE — ED Notes (Signed)
Pt up walking around the unit independently

## 2022-05-29 NOTE — Assessment & Plan Note (Signed)
Check hemoglobin A1c Continue insulin 70/30 10 units BID.  Start metformin XR with ramp-up Counseled on diet Future plan to continue adding oral medications and try to get patient off of insulin.

## 2022-05-29 NOTE — ED Provider Notes (Signed)
[place] EMERGENCY DEPARTMENT AT [facility] Provider Note   CSN: [id number] Arrival date & time: 05/29/22  1142     History {Add pertinent medical, surgical, social history, OB history to HPI:1} Chief Complaint  Patient presents with   Leg Swelling         Gevena BarreKenneth L Yang is a 67 y.o. male.  HPI Patient presents for left leg swelling and redness.  Medical history includes prostate CA, COPD, T2DM, HTN, GERD, CVA, neuropathy.  Last summer, he stepped on a screw which penetrated his forefoot on his left foot.  He had subsequent abscess and was treated with I&D and Bactrim.  He has not had any recent injuries.  He has had worsening redness and swelling over the past 2 to 3 weeks.  Pain has been minimal.  Patient denies any recent fevers or chills.  He was seen by PCP today and sent to the ED for further evaluation.     Home Medications Prior to Admission medications   Medication Sig Start Date End Date Taking? Authorizing Provider  albuterol (VENTOLIN HFA) 108 (90 Base) MCG/ACT inhaler Inhale 2 puffs into the lungs every 6 (six) hours as needed for wheezing or shortness of breath. 05/29/22   Gardenia PhlegmSteen, James J, MD  blood glucose meter kit and supplies Dispense based on patient and insurance preference. Use up to four times daily as directed. (FOR ICD-10 E10.9, E11.9). 02/03/17   Langston ReusingUkleja, Alexandria U, MD  gabapentin (NEURONTIN) 300 MG capsule Take 1 capsule (300 mg total) by mouth 3 (three) times daily as needed. 05/29/22   Gardenia PhlegmSteen, James J, MD  insulin NPH-regular Human (NOVOLIN 70/30) (70-30) 100 UNIT/ML injection Inject 10 Units into the skin 2 (two) times daily with a meal. 02/03/17   Langston ReusingUkleja, Alexandria U, MD  Insulin Syringes, Disposable, U-100 0.3 ML MISC 10 Units by Does not apply route 2 (two) times daily with breakfast and lunch. 02/03/17   Langston ReusingUkleja, Alexandria U, MD  metFORMIN (GLUCOPHAGE-XR) 500 MG 24 hr tablet Take 500 mg by mouth once daily x1 week, then increase to 500 mg  twice daily x1 week, then 1,000 mg in the AM and 500 mg in the PM x1 week, and finally 1,000 mg twice daily. Take with food. 05/29/22   Gardenia PhlegmSteen, James J, MD      Allergies    Penicillins    Review of Systems   Review of Systems  Cardiovascular:  Positive for leg swelling.  All other systems reviewed and are negative.   Physical Exam Updated Vital Signs BP (!) 147/99 (BP Location: Right Arm)   Pulse 70   Temp (!) 97.5 F (36.4 C) (Oral)   Resp 18   Ht 5\' 11"  (1.803 m)   Wt 94.3 kg   SpO2 100%   BMI 29.01 kg/m  Physical Exam Vitals and nursing note reviewed.  Constitutional:      General: He is not in acute distress.    Appearance: Normal appearance. He is well-developed. He is not ill-appearing, toxic-appearing or diaphoretic.  HENT:     Head: Normocephalic and atraumatic.     Right Ear: External ear normal.     Left Ear: External ear normal.     Nose: Nose normal.     Mouth/Throat:     Mouth: Mucous membranes are moist.  Eyes:     Extraocular Movements: Extraocular movements intact.     Conjunctiva/sclera: Conjunctivae normal.  Cardiovascular:     Rate and Rhythm: Normal rate and  regular rhythm.  Pulmonary:     Effort: Pulmonary effort is normal. No respiratory distress.  Abdominal:     General: There is no distension.     Palpations: Abdomen is soft.     Tenderness: There is no abdominal tenderness.  Musculoskeletal:     Cervical back: Normal range of motion and neck supple.     Right lower leg: No edema.     Left lower leg: Edema present.  Skin:    General: Skin is warm and dry.     Findings: Erythema present.  Neurological:     General: No focal deficit present.     Mental Status: He is alert and oriented to person, place, and time.     Cranial Nerves: No cranial nerve deficit.     Sensory: No sensory deficit.     Motor: No weakness.     Coordination: Coordination normal.  Psychiatric:        Mood and Affect: Mood normal.        Behavior: Behavior normal.         Thought Content: Thought content normal.        Judgment: Judgment normal.     ED Results / Procedures / Treatments   Labs (all labs ordered are listed, but only abnormal results are displayed) Labs Reviewed  COMPREHENSIVE METABOLIC PANEL - Abnormal; Notable for the following components:      Result Value   Glucose, Bld 170 (*)    Albumin 3.3 (*)    AST 13 (*)    All other components within normal limits  CBC WITH DIFFERENTIAL/PLATELET - Abnormal; Notable for the following components:   WBC 10.6 (*)    Eosinophils Absolute 0.6 (*)    All other components within normal limits  LACTIC ACID, PLASMA  LACTIC ACID, PLASMA    EKG None  Radiology US Venous Img Lower Unilateral Left  Result Date: 05/29/2022 CLINICAL DATA:  Left lower extremity swelling and redness for 3 weeks EXAM: LEFT LOWER EXTREMITY VENOUS DOPPLER ULTRASOUND TECHNIQUE: Gray-scale sonography with compression, as well as color and duplex ultrasound, were performed to evaluate the deep venous system(s) from the level of the common femoral vein through the popliteal and proximal calf veins. COMPARISON:  None Available. FINDINGS: VENOUS Normal compressibility of the common femoral, superficial femoral, and popliteal veins, as well as the visualized calf veins. Visualized portions of profunda femoral vein and great saphenous vein unremarkable. No filling defects to suggest DVT on grayscale or color Doppler imaging. Doppler waveforms show normal direction of venous flow, normal respiratory plasticity and response to augmentation. Limited views of the contralateral common femoral vein are unremarkable. OTHER None. Limitations: none IMPRESSION: Negative for deep vein thrombosis in the left lower extremity. Electronically Signed   By: Jacob Moores M.D.   On: 05/29/2022 13:53    Procedures Procedures  {Document cardiac monitor, telemetry assessment procedure when appropriate:1}  Medications Ordered in ED Medications -  No data to display  ED Course/ Medical Decision Making/ A&P   {   Click here for ABCD2, HEART and other calculatorsREFRESH Note before signing :1}                          Medical Decision Making Amount and/or Complexity of Data Reviewed Labs: ordered. Radiology: ordered.   This patient presents to the ED for concern of ***, this involves an extensive number of treatment options, and is a complaint that carries with  it a high risk of complications and morbidity.  The differential diagnosis includes ***   Co morbidities that complicate the patient evaluation  ***   Additional history obtained:  Additional history obtained from *** External records from outside source obtained and reviewed including ***   Lab Tests:  I Ordered, and personally interpreted labs.  The pertinent results include:  ***   Imaging Studies ordered:  I ordered imaging studies including ***  I independently visualized and interpreted imaging which showed *** I agree with the radiologist interpretation   Cardiac Monitoring: / EKG:  The patient was maintained on a cardiac monitor.  I personally viewed and interpreted the cardiac monitored which showed an underlying rhythm of: ***   Consultations Obtained:  I requested consultation with the ***,  and discussed lab and imaging findings as well as pertinent plan - they recommend: ***   Problem List / ED Course / Critical interventions / Medication management  Patient presents for left lower extremity redness and swelling.  He was seen by PCP earlier today and sent to the ED for further evaluation.  Prior to being bedded in the ED, diagnostic workup was initiated. Lab work shows a slight leukocytosis.  Kidney function and electrolytes are normal.  Lactate is normal.  On assessment, patient is resting comfortably.  He states that despite redness and swelling, pain has been minimal.  On exam, he does have mild swelling to left foot and distal lower  extremity.  There is slight tenderness.  X-ray imaging,***. I ordered medication including ***  for ***  Reevaluation of the patient after these medicines showed that the patient {resolved/improved/worsened:23923::"improved"} I have reviewed the patients home medicines and have made adjustments as needed   Social Determinants of Health:  ***   Test / Admission - Considered:  ***   {Document critical care time when appropriate:1} {Document review of labs and clinical decision tools ie heart score, Chads2Vasc2 etc:1}  {Document your independent review of radiology images, and any outside records:1} {Document your discussion with family members, caretakers, and with consultants:1} {Document social determinants of health affecting pt's care:1} {Document your decision making why or why not admission, treatments were needed:1} Final Clinical Impression(s) / ED Diagnoses Final diagnoses:  None    Rx / DC Orders ED Discharge Orders     None

## 2022-05-29 NOTE — ED Triage Notes (Signed)
Pt presents with L leg swelling and redness x 2 weeks. Pt denies any recent injury or illness. Denies open wounds. Pt's PCP called to say to rule out osteomyelitis. Pt with hx of DM.

## 2022-05-29 NOTE — Assessment & Plan Note (Signed)
Patient started smoking at age 67 and quit 14 years ago.  He smoked 2 packs/day for 76-pack-year smoking history. Wheezing on exam.  Start albuterol as needed Will discuss going for PFTs at next visit

## 2022-05-29 NOTE — Assessment & Plan Note (Signed)
BP 170/84 today.  Patient has left leg which is red and swollen. Reports it has been normal when checking at [facility].  Will reevaluate at next follow-up before starting antihypertensive.

## 2022-05-29 NOTE — Progress Notes (Addendum)
HPI:[name] is a 67 y.o. 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  Patient's left foot and leg began to swell and become red 3 weeks ago. He has recently went to the beach and thought some of the redness was from sunburn.  He had a screw removed from left foot approximately 6 months ago in ED. He was seen by Dr. Lajoyce Corners for a abscess which formed a month later and had I&D. Cellulitis treated with Bactrim DS. He has had no recent injuries to foot. No fever or chills.  For the details of today's visit, please refer to the assessment and plan.    Past Medical History:  Diagnosis Date   Cancer    prostate cancer    Diabetes mellitus    GERD (gastroesophageal reflux disease)    History of kidney stones    Hypertension    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 in 2010 . denies any residual deficits     Past Surgical History:  Procedure Laterality Date   APPENDECTOMY     CHOLECYSTECTOMY  12/30/2010   Procedure: LAPAROSCOPIC CHOLECYSTECTOMY;  Surgeon: Dalia Heading;  Location: [facility];  Service: General;  Laterality: N/A;   INCISION AND DRAINAGE ABSCESS Left 02/01/2017   Procedure: INCISION AND DRAINAGE ABSCESS LEFT HAND;  Surgeon: Vickki Hearing, MD;  Location: [facility];  Service: Orthopedics;  Laterality: Left;   LYMPHADENECTOMY Bilateral 08/02/2017   Procedure: LYMPHADENECTOMY, PELVIC;  Surgeon: Heloise Purpura, MD;  Location: [facility];  Service: Urology;  Laterality: Bilateral;   ROBOT ASSISTED LAPAROSCOPIC RADICAL PROSTATECTOMY N/A 08/02/2017   Procedure: XI ROBOTIC ASSISTED LAPAROSCOPIC RADICAL PROSTATECTOMY LEVEL 2;  Surgeon: Heloise Purpura, MD;  Location: [facility];   Service: Urology;  Laterality: N/A;    Family History  Problem Relation Age of Onset   Heart failure Mother    Cancer Father     Social History   Tobacco Use   Smoking status: Former    Packs/day: 2.00    Years: 38.00    Additional pack years: 0.00    Total pack years: 76.00    Types: Cigarettes    Start date: 22    Quit date: 2010    Years since quitting: 14.2   Smokeless tobacco: Never  Substance Use Topics   Alcohol use: No   Drug use: Yes    Types: Marijuana    Comment: occ     Physical Exam: Vitals:   05/29/22 1030 05/29/22 1036  BP: (!) 172/92 (!) 170/84  Pulse: 81   SpO2: 96%   Weight: 208 lb 1.9 oz (94.4 kg)   Height: 5\' 11"  (1.803 m)      Physical Exam Constitutional:      General: He is not in acute distress.    Appearance: He is not ill-appearing.  Cardiovascular:     Rate and Rhythm: Normal rate and regular rhythm.     Heart sounds: No murmur heard. Pulmonary:     Effort: No respiratory distress.     Breath sounds: Wheezing present.  Musculoskeletal:        General: Swelling present. No tenderness or signs  of injury.     Left lower leg: Edema present.  Skin:    Findings: Erythema present. No rash.  Neurological:     Sensory: No sensory deficit (bilateral peripheral neuropathy).  Psychiatric:        Mood and Affect: Mood normal.        Behavior: Behavior normal.      Assessment & Plan:   Cellulitis of left leg Patient has hot and swollen left leg.  Review of previous hemoglobin A1c's show his diabetes has been uncontrolled and he had recent abscess 6 months ago.  I am concerned the patient has possible underlying osteomyelitis and recommended he go to the emergency room for imaging and stat labs.  I have called and spoken to the charge nurse who is aware patient will be coming to [facility].   Uncontrolled type 2 diabetes mellitus with hyperglycemia (HCC) Check hemoglobin A1c Continue insulin 70/30 10 units BID.  Start  metformin XR with ramp-up Counseled on diet Future plan to continue adding oral medications and try to get patient off of insulin.  COPD suggested by initial evaluation Patient started smoking at age 23 and quit 14 years ago.  He smoked 2 packs/day for 76-pack-year smoking history. Wheezing on exam.  Start albuterol as needed Will discuss going for PFTs at next visit  Hypertension BP 170/84 today.  Patient has left leg which is red and swollen. Reports it has been normal when checking at [facility].  Will reevaluate at next follow-up before starting antihypertensive.     Milus Banister, MD

## 2022-05-30 DIAGNOSIS — Z794 Long term (current) use of insulin: Secondary | ICD-10-CM | POA: Diagnosis not present

## 2022-05-30 DIAGNOSIS — M86172 Other acute osteomyelitis, left ankle and foot: Secondary | ICD-10-CM | POA: Diagnosis present

## 2022-05-30 DIAGNOSIS — Z89422 Acquired absence of other left toe(s): Secondary | ICD-10-CM | POA: Diagnosis present

## 2022-05-30 DIAGNOSIS — M869 Osteomyelitis, unspecified: Secondary | ICD-10-CM | POA: Diagnosis not present

## 2022-05-30 DIAGNOSIS — Z8249 Family history of ischemic heart disease and other diseases of the circulatory system: Secondary | ICD-10-CM | POA: Diagnosis not present

## 2022-05-30 DIAGNOSIS — L02612 Cutaneous abscess of left foot: Secondary | ICD-10-CM | POA: Diagnosis not present

## 2022-05-30 DIAGNOSIS — Z87891 Personal history of nicotine dependence: Secondary | ICD-10-CM | POA: Diagnosis not present

## 2022-05-30 DIAGNOSIS — E1165 Type 2 diabetes mellitus with hyperglycemia: Secondary | ICD-10-CM | POA: Diagnosis not present

## 2022-05-30 DIAGNOSIS — Z8673 Personal history of transient ischemic attack (TIA), and cerebral infarction without residual deficits: Secondary | ICD-10-CM | POA: Diagnosis not present

## 2022-05-30 DIAGNOSIS — L03116 Cellulitis of left lower limb: Secondary | ICD-10-CM | POA: Diagnosis not present

## 2022-05-30 DIAGNOSIS — K219 Gastro-esophageal reflux disease without esophagitis: Secondary | ICD-10-CM | POA: Diagnosis not present

## 2022-05-30 DIAGNOSIS — Z8546 Personal history of malignant neoplasm of prostate: Secondary | ICD-10-CM | POA: Diagnosis not present

## 2022-05-30 DIAGNOSIS — Z7984 Long term (current) use of oral hypoglycemic drugs: Secondary | ICD-10-CM | POA: Diagnosis not present

## 2022-05-30 DIAGNOSIS — J449 Chronic obstructive pulmonary disease, unspecified: Secondary | ICD-10-CM | POA: Diagnosis not present

## 2022-05-30 DIAGNOSIS — Z9079 Acquired absence of other genital organ(s): Secondary | ICD-10-CM | POA: Diagnosis not present

## 2022-05-30 DIAGNOSIS — E11628 Type 2 diabetes mellitus with other skin complications: Secondary | ICD-10-CM | POA: Diagnosis not present

## 2022-05-30 DIAGNOSIS — Z880 Allergy status to penicillin: Secondary | ICD-10-CM | POA: Diagnosis not present

## 2022-05-30 DIAGNOSIS — M009 Pyogenic arthritis, unspecified: Secondary | ICD-10-CM | POA: Diagnosis not present

## 2022-05-30 DIAGNOSIS — I10 Essential (primary) hypertension: Secondary | ICD-10-CM | POA: Diagnosis not present

## 2022-05-30 DIAGNOSIS — Z79899 Other long term (current) drug therapy: Secondary | ICD-10-CM | POA: Diagnosis not present

## 2022-05-30 DIAGNOSIS — E1142 Type 2 diabetes mellitus with diabetic polyneuropathy: Secondary | ICD-10-CM | POA: Diagnosis not present

## 2022-05-30 DIAGNOSIS — E1169 Type 2 diabetes mellitus with other specified complication: Secondary | ICD-10-CM | POA: Diagnosis not present

## 2022-05-30 LAB — CULTURE, BLOOD (ROUTINE X 2)

## 2022-05-30 LAB — GLUCOSE, CAPILLARY
Glucose-Capillary: 344 mg/dL — ABNORMAL HIGH (ref 70–99)
Glucose-Capillary: 174 mg/dL — ABNORMAL HIGH (ref 70–99)
Glucose-Capillary: 108 mg/dL — ABNORMAL HIGH (ref 70–99)

## 2022-05-30 LAB — HEMOGLOBIN A1C
Hgb A1c MFr Bld: 14.3 % — ABNORMAL HIGH (ref 4.8–5.6)
Mean Plasma Glucose: 363.71 mg/dL

## 2022-05-30 MED ORDER — HYDROCODONE-ACETAMINOPHEN 5-325 MG PO TABS
1.0000 | ORAL_TABLET | ORAL | Status: DC | PRN
  Administered 2022-05-30 – 2022-05-31 (×3): 2 via ORAL
  Filled 2022-05-30 (×3): qty 2

## 2022-05-30 MED ORDER — VANCOMYCIN HCL 2000 MG/400ML IV SOLN
2000.0000 mg | Freq: Once | INTRAVENOUS | Status: AC
  Administered 2022-05-30: 2000 mg via INTRAVENOUS
  Filled 2022-05-30: qty 400

## 2022-05-30 MED ORDER — ENOXAPARIN SODIUM 40 MG/0.4ML IJ SOSY
40.0000 mg | PREFILLED_SYRINGE | INTRAMUSCULAR | Status: DC
  Administered 2022-05-30 – 2022-06-01 (×3): 40 mg via SUBCUTANEOUS
  Filled 2022-05-30 (×3): qty 0.4

## 2022-05-30 MED ORDER — METRONIDAZOLE 500 MG/100ML IV SOLN
500.0000 mg | Freq: Two times a day (BID) | INTRAVENOUS | Status: DC
  Administered 2022-05-30 – 2022-06-02 (×7): 500 mg via INTRAVENOUS
  Filled 2022-05-30 (×7): qty 100

## 2022-05-30 MED ORDER — SODIUM CHLORIDE 0.9 % IV SOLN
2.0000 g | Freq: Three times a day (TID) | INTRAVENOUS | Status: DC
  Administered 2022-05-30 – 2022-06-01 (×7): 2 g via INTRAVENOUS
  Filled 2022-05-30 (×7): qty 12.5

## 2022-05-30 MED ORDER — LIVING WELL WITH DIABETES BOOK
Freq: Once | Status: AC
  Filled 2022-05-30: qty 1

## 2022-05-30 MED ORDER — ALBUTEROL SULFATE (2.5 MG/3ML) 0.083% IN NEBU: 3.0000 mL | INHALATION_SOLUTION | Freq: Four times a day (QID) | RESPIRATORY_TRACT | Status: DC | PRN

## 2022-05-30 MED ORDER — INSULIN GLARGINE-YFGN 100 UNIT/ML ~~LOC~~ SOLN
20.0000 [IU] | Freq: Every day | SUBCUTANEOUS | Status: DC
  Administered 2022-05-30 – 2022-06-02 (×4): 20 [IU] via SUBCUTANEOUS
  Filled 2022-05-30 (×4): qty 0.2

## 2022-05-30 MED ORDER — AMLODIPINE BESYLATE 10 MG PO TABS
10.0000 mg | ORAL_TABLET | Freq: Every day | ORAL | Status: DC
  Administered 2022-05-30 – 2022-06-02 (×4): 10 mg via ORAL
  Filled 2022-05-30 (×4): qty 1

## 2022-05-30 MED ORDER — HYDRALAZINE HCL 20 MG/ML IJ SOLN: 10.0000 mg | Freq: Four times a day (QID) | INTRAMUSCULAR | Status: DC | PRN

## 2022-05-30 MED ORDER — VANCOMYCIN HCL 1500 MG/300ML IV SOLN
1500.0000 mg | Freq: Two times a day (BID) | INTRAVENOUS | Status: DC
  Administered 2022-05-30 – 2022-06-02 (×6): 1500 mg via INTRAVENOUS
  Filled 2022-05-30 (×7): qty 300

## 2022-05-30 MED ORDER — INSULIN ASPART 100 UNIT/ML IJ SOLN
0.0000 [IU] | Freq: Three times a day (TID) | INTRAMUSCULAR | Status: DC
  Administered 2022-05-30: 3 [IU] via SUBCUTANEOUS
  Administered 2022-05-30: 11 [IU] via SUBCUTANEOUS
  Administered 2022-05-31 (×2): 3 [IU] via SUBCUTANEOUS
  Administered 2022-06-01: 5 [IU] via SUBCUTANEOUS
  Administered 2022-06-01 (×2): 3 [IU] via SUBCUTANEOUS
  Administered 2022-06-02 (×2): 5 [IU] via SUBCUTANEOUS
  Administered 2022-06-02: 3 [IU] via SUBCUTANEOUS

## 2022-05-30 MED ORDER — MORPHINE SULFATE (PF) 2 MG/ML IV SOLN: 2.0000 mg | INTRAVENOUS | Status: DC | PRN

## 2022-05-30 NOTE — Progress Notes (Signed)
Pharmacy Antibiotic Note  Grant Yang is a 67 y.o. male admitted on 05/29/2022 with  Osteomyelitis/Cellulitis of LLE .  Pharmacy has been consulted for Vancomycin dosing.  Patient presenting with LLE cellulitis with underlying osteomyelitis. Patient has Hx of DM and abscess 6 months ago. LLE XR showed soft tissue swelling and osteolytic process. MRI confirmed findings indicating osteomyelitis and septic arthritis. WBC 10.6, Tmax24 97.9, Scr 0.76 (CrCl 109.4).   Plan: Vancomycin 2000mg  IV X 1 dose Vancomycin 1500mg  IV Q12H >>>Estimated AUC: 484.5 Trend WBC, temp, renal function  F/U infectious work-up Drug levels as indicated   Height: 5\' 11"  (180.3 cm) Weight: 94.3 kg (208 lb) IBW/kg (Calculated) : 75.3  Temp (24hrs), Avg:97.7 F (36.5 C), Min:97.5 F (36.4 C), Max:97.9 F (36.6 C)  Recent Labs  Lab 05/29/22 1506 05/29/22 1642  WBC 10.6*  --   CREATININE 0.76  --   LATICACIDVEN 0.7 0.7    Estimated Creatinine Clearance: 109.4 mL/min (by C-G formula based on SCr of 0.76 mg/dL).    Allergies  Allergen Reactions   Penicillins Other (See Comments)    Childhood allergy. Has patient had a PCN reaction causing immediate rash, facial/tongue/throat swelling, SOB or lightheadedness with hypotension: Unknown Has patient had a PCN reaction causing severe rash involving mucus membranes or skin necrosis: Unknown Has patient had a PCN reaction that required hospitalization: Unknown Has patient had a PCN reaction occurring within the last 10 years: No If all of the above answers are "NO", then may proceed with Cephalosporin use.  Other reaction(s): unknown   Thank you for allowing pharmacy to be a part of this patient's care.  Loretta Plume 05/30/2022 1:08 AM

## 2022-05-30 NOTE — Progress Notes (Addendum)
Inpatient Diabetes Program Recommendations  AACE/ADA: New Consensus Statement on Inpatient Glycemic Control (2015)  Target Ranges:  Prepandial:   less than 140 mg/dL      Peak postprandial:   less than 180 mg/dL (1-2 hours)      Critically ill patients:  140 - 180 mg/dL   Lab Results  Component Value Date   GLUCAP 344 (H) 05/30/2022   HGBA1C 14.3 (H) 05/29/2022    Review of Glycemic Control  Latest Reference Range & Units 05/30/22 11:31  Glucose-Capillary 70 - 99 mg/dL 567 (H)   Diabetes history: DM 2 Outpatient Diabetes medications:  Novolin 70/30 30 units bid Current orders for Inpatient glycemic control:  Novolog 0-15 units tid with meals  Inpatient Diabetes Program Recommendations:    Note that patient was on insulin prior to admit However, A1C is very high. Consider adding Semglee 20 units daily and increase frequency of Novolog correction to q 4 hours while NPO after MN.  Will discuss A1C with patient when appropriate.   Addendum 1430- Spoke to patient regarding home glycemic control.  He just recently restarted insulin in the past 2-3 weeks after being off of insulin for a prolonged amount of time.  Patient was taking "gummy" that was recommended by Dr. Neil Crouch to cure diabetes after 7 days.  He states that he received 5 bottles of it and took it as recommended with money back guarantee.  I told patient that there is no such "medical" gummy that cures diabetes and recommended that he attempt to get his money back.  We discussed current A1C of 14.3% and that this corresponds to average blood sugars of 364 mg/dL.  We discussed goal blood sugars and A1C.  Per patient he had not seen MD until the past week since the pandemic started.  He states that metformin was added by PCP on 05/29/22-  Explained that this would be taken in addition to insulin which likely will need adjustment as patient bought without Rx. From [employer].  He also states that he bought some "strawberry drinks" to help with  his DM that he saw at [facility] but that son told him that they were "full of sugar".  Explained that based on his A1C, he needs to take insulin and have adjustments made by PCP.  Will order LWWD booklet and dietician consult for patient as a review regarding DM.  Will follow.   Thanks,  Beryl Meager, RN, BC-ADM Inpatient Diabetes Coordinator Pager [phone]  (8a-5p)

## 2022-05-30 NOTE — H&P (Addendum)
History and Physical    Grant Yang [id number] DOB: 02/25/1955 DOA: 05/29/2022  PCP: Billie Lade, MD   Patient coming from: Home    Chief Complaint: Left leg swelling/pain  HPI: Grant Yang is a 67 y.o. male with medical history significant of diabetes type 2, prostate cancer in remission, GERD, neuropathy who presented to the emergency department with complaint of left leg swelling, pain.  Patient reported 2 to 3 weeks history of worsening redness, swelling.  Pain was not significant.  He has history of stepping on a screw which penetrated  his left forefoot on last year July.  He underwent I&D by Dr. Lajoyce Corners and was treated with Bactrim after he was found to have abscess.  No history of recent injuries.  No problem with ambulation recently.  All of a sudden, about 2 to 3 weeks ago he started developing redness and swelling of his left leg.  Patient was seen by his PCP in the office for the left leg swelling and was sent to the emergency room for the suspicion of underlying foot infection. Patient seen and examined at bedside in the emergency department.  During my evaluation, he was hemodynamically stable.  He was standing on his feet.  Denies any pain in the left foot  Foot examined at the bedside, there was no ulcer or drainage but there was erythema and swelling. No report of chest pain, shortness of breath, cough, abdomen pain, nausea, vomiting, hematochezia, no diarrhea or dysuria.   ED Course: On presentation, he was found to be hypertensive.  Lab work showed mild leukocytosis of 10.6.  Lactate level was normal.  Found to have edema and erythema of left foot.Left foot x-ray showed  osteolytic process at the second metatarsophalangeal suspect this for infection.  MRI of left foot showed bone marrow edema of the entire second metatarsal and proximal 2/3 of the proximal phalanx of the second digit consistent with osteomyelitis.Joint effusion at the second metatarsophalangeal  joint suggesting septic arthritis.Marrow edema of the third metatarsal head suspicious for osteomyelitis. 1.4 x 1.7 x 6.3 cm collection  consistent with abscess.  Orthopedic surgery consulted, discussed with Dr. Romeo Apple who recommended to transfer to [facility] to continue the treatment under Dr. Lajoyce Corners who did I&D on his foot last year.  Started on broad aspirin antibiotics with vancomycin, cefepime and Flagyl.  Blood culture sent.  Patient being transferred to [facility] for further treatment.  Dr. Lajoyce Corners aware.  Review of Systems: As per HPI otherwise 10 point review of systems negative.    Past Medical History:  Diagnosis Date   Cancer    prostate cancer    Diabetes mellitus    GERD (gastroesophageal reflux disease)    History of kidney stones    Hypertension    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 [place] in 2010 . denies any residual deficits     Past Surgical History:  Procedure Laterality Date   APPENDECTOMY     CHOLECYSTECTOMY  12/30/2010   Procedure: LAPAROSCOPIC CHOLECYSTECTOMY;  Surgeon: Dalia Heading;  Location: [facility];  Service: General;  Laterality: N/A;   INCISION AND DRAINAGE ABSCESS Left 02/01/2017   Procedure: INCISION AND DRAINAGE ABSCESS LEFT HAND;  Surgeon: Fuller Canada  E, MD;  Location: [facility];  Service: Orthopedics;  Laterality: Left;   LYMPHADENECTOMY Bilateral 08/02/2017   Procedure: LYMPHADENECTOMY, PELVIC;  Surgeon: Heloise Purpura, MD;  Location: [facility];  Service: Urology;  Laterality: Bilateral;   ROBOT ASSISTED LAPAROSCOPIC RADICAL PROSTATECTOMY N/A 08/02/2017   Procedure: XI ROBOTIC ASSISTED LAPAROSCOPIC RADICAL PROSTATECTOMY LEVEL 2;  Surgeon: Heloise Purpura, MD;  Location: [facility];  Service: Urology;  Laterality: N/A;     reports that he quit smoking  about 14 years ago. His smoking use included cigarettes. He started smoking about 52 years ago. He has a 76.00 pack-year smoking history. He has never used smokeless tobacco. He reports current drug use. Drug: Marijuana. He reports that he does not drink alcohol.  Allergies  Allergen Reactions   Penicillins Other (See Comments)    Childhood allergy. Has patient had a PCN reaction causing immediate rash, facial/tongue/throat swelling, SOB or lightheadedness with hypotension: Unknown Has patient had a PCN reaction causing severe rash involving mucus membranes or skin necrosis: Unknown Has patient had a PCN reaction that required hospitalization: Unknown Has patient had a PCN reaction occurring within the last 10 years: No If all of the above answers are "NO", then may proceed with Cephalosporin use.  Other reaction(s): unknown    Family History  Problem Relation Age of Onset   Heart failure Mother    Cancer Father      Prior to Admission medications   Medication Sig Start Date End Date Taking? Authorizing Provider  albuterol (VENTOLIN HFA) 108 (90 Base) MCG/ACT inhaler Inhale 2 puffs into the lungs every 6 (six) hours as needed for wheezing or shortness of breath. 05/29/22   Gardenia Phlegm, MD  blood glucose meter kit and supplies Dispense based on patient and insurance preference. Use up to four times daily as directed. (FOR ICD-10 E10.9, E11.9). 02/03/17   Langston Reusing, MD  gabapentin (NEURONTIN) 300 MG capsule Take 1 capsule (300 mg total) by mouth 3 (three) times daily as needed. 05/29/22   Gardenia Phlegm, MD  insulin NPH-regular Human (NOVOLIN 70/30) (70-30) 100 UNIT/ML injection Inject 10 Units into the skin 2 (two) times daily with a meal. 02/03/17   Langston Reusing, MD  Insulin Syringes, Disposable, U-100 0.3 ML MISC 10 Units by Does not apply route 2 (two) times daily with breakfast and lunch. 02/03/17   Langston Reusing, MD  metFORMIN (GLUCOPHAGE-XR) 500 MG 24 hr  tablet Take 500 mg by mouth once daily x1 week, then increase to 500 mg twice daily x1 week, then 1,000 mg in the AM and 500 mg in the PM x1 week, and finally 1,000 mg twice daily. Take with food. 05/29/22   Gardenia Phlegm, MD    Physical Exam: Vitals:   05/30/22 0330 05/30/22 0400 05/30/22 0440 05/30/22 0600  BP: (!) 142/78 (!) 149/84 (!) 127/93 (!) 152/78  Pulse: 63 75 74 72  Resp:   20 18  Temp:   97.9 F (36.6 C)   TempSrc:   Oral   SpO2: 99% 96% 100% 99%  Weight:      Height:        Constitutional: NAD, calm, comfortable Vitals:   05/30/22 0330 05/30/22 0400 05/30/22 0440 05/30/22 0600  BP: (!) 142/78 (!) 149/84 (!) 127/93 (!) 152/78  Pulse: 63 75 74 72  Resp:   20 18  Temp:   97.9 F (36.6 C)   TempSrc:   Oral   SpO2: 99% 96% 100% 99%  Weight:      Height:       Eyes: PERRL, lids and conjunctivae normal ENMT: Mucous membranes are moist.  Neck: normal, supple, no masses, no thyromegaly Respiratory: clear to auscultation bilaterally, no wheezing, no crackles. Normal respiratory effort. No accessory muscle use.  Cardiovascular: Regular rate and rhythm, no murmurs / rubs / gallops. No extremity edema.  Abdomen: no tenderness, no masses palpated. No hepatosplenomegaly. Bowel sounds positive.  Musculoskeletal: no clubbing / cyanosis. No joint deformity upper and lower extremities.  Redness and mild edema of left foot mostly on the plantar surface Skin: no rashes, lesions, ulcers. No induration Neurologic: CN 2-12 grossly intact.  Strength 5/5 in all 4.  Psychiatric: Normal judgment and insight. Alert and oriented x 3. Normal mood.   Foley Catheter:None  Labs on Admission: I have personally reviewed following labs and imaging studies  CBC: Recent Labs  Lab 05/29/22 1506  WBC 10.6*  NEUTROABS 6.3  HGB 13.4  HCT 40.8  MCV 87.4  PLT 382   Basic Metabolic Panel: Recent Labs  Lab 05/29/22 1506  NA 135  K 4.0  CL 101  CO2 27  GLUCOSE 170*  BUN 19  CREATININE  0.76  CALCIUM 9.2   GFR: Estimated Creatinine Clearance: 109.4 mL/min (by C-G formula based on SCr of 0.76 mg/dL). Liver Function Tests: Recent Labs  Lab 05/29/22 1506  AST 13*  ALT 14  ALKPHOS 75  BILITOT 0.9  PROT 7.3  ALBUMIN 3.3*   No results for input(s): "LIPASE", "AMYLASE" in the last 168 hours. No results for input(s): "AMMONIA" in the last 168 hours. Coagulation Profile: No results for input(s): "INR", "PROTIME" in the last 168 hours. Cardiac Enzymes: No results for input(s): "CKTOTAL", "CKMB", "CKMBINDEX", "TROPONINI" in the last 168 hours. BNP (last 3 results) No results for input(s): "PROBNP" in the last 8760 hours. HbA1C: No results for input(s): "HGBA1C" in the last 72 hours. CBG: No results for input(s): "GLUCAP" in the last 168 hours. Lipid Profile: No results for input(s): "CHOL", "HDL", "LDLCALC", "TRIG", "CHOLHDL", "LDLDIRECT" in the last 72 hours. Thyroid Function Tests: No results for input(s): "TSH", "T4TOTAL", "FREET4", "T3FREE", "THYROIDAB" in the last 72 hours. Anemia Panel: No results for input(s): "VITAMINB12", "FOLATE", "FERRITIN", "TIBC", "IRON", "RETICCTPCT" in the last 72 hours. Urine analysis:    Component Value Date/Time   COLORURINE YELLOW 12/28/2010 0813   APPEARANCEUR CLEAR 12/28/2010 0813   LABSPEC 1.025 12/28/2010 0813   PHURINE 5.0 12/28/2010 0813   GLUCOSEU >1000 (A) 12/28/2010 0813   HGBUR TRACE (A) 12/28/2010 0813   BILIRUBINUR NEGATIVE 12/28/2010 0813   KETONESUR NEGATIVE 12/28/2010 0813   PROTEINUR 100 (A) 12/28/2010 0813   UROBILINOGEN 0.2 12/28/2010 0813   NITRITE NEGATIVE 12/28/2010 0813   LEUKOCYTESUR NEGATIVE 12/28/2010 0813    Radiological Exams on Admission: MR FOOT LEFT W WO CONTRAST  Result Date: 05/29/2022 CLINICAL DATA:  Soft tissue infection suspected. EXAM: MRI OF THE LEFT FOREFOOT WITHOUT AND WITH CONTRAST TECHNIQUE: Multiplanar, multisequence MR imaging of the left forefoot was performed both before and  after administration of intravenous contrast. CONTRAST:  44mL GADAVIST GADOBUTROL 1 MMOL/ML IV SOLN COMPARISON:  Radiographs dated May 29, 2018. FINDINGS: Bones/Joint/Cartilage Bone marrow edema of the second metatarsal and proximal 2/3 of the proximal phalanx of the second digit. Joint effusion at the second metatarsophalangeal joint suggesting septic arthritis with associated osseous erosion of the metatarsal head and plantar subluxation. Bone marrow edema of the third metatarsal head. Ligaments Lisfranc ligament is intact.  Collateral ligament insufficiency at the second metatarsophalangeal joint with plantar subluxation. Muscles and Tendons Increased intrasubstance signal of the plantar muscles suggesting diabetic myopathy/myositis. Soft tissues Deep skin wound about the plantar aspect of the second metatarsophalangeal joint. Peripherally enhancing fluid collection along the second metatarsal which measures at least 1.4 x 1.7 x 6.3 cm consistent with abscess. Generalized soft tissue edema about the dorsum of the foot suggesting cellulitis. IMPRESSION: 1. Bone marrow edema of the entire second metatarsal and proximal 2/3 of the proximal phalanx of the second digit consistent with osteomyelitis. 2. Joint effusion at the second metatarsophalangeal joint suggesting septic arthritis with associated osseous erosion and subluxation of the metatarsal head. 3. Marrow edema of the third metatarsal head, likely reactive secondary to altered mechanics, differential however also includes osteomyelitis. 4. Deep skin wound about the plantar aspect of the second metatarsophalangeal joint and a peripherally enhancing fluid collection along the second metatarsal which measures at least 1.4 x 1.7 x 6.3 cm consistent with abscess. 5. Increased intrasubstance signal of the plantar muscles suggesting diabetic myopathy/myositis. Electronically Signed   By: Larose HiresImran  Ahmed D.O.   On: 05/29/2022 23:22   DG Foot 2 Views Left  Result  Date: 05/29/2022 CLINICAL DATA:  Skin infection EXAM: LEFT FOOT - 2 VIEW; LEFT TIBIA AND FIBULA - 2 VIEW COMPARISON:  None Available. FINDINGS: Soft tissue swelling at the ankle mortise. No acute fracture, dislocation or subluxation. Small posterior and plantar calcaneal spurs. Osteolytic process at the second MTP joint. An infectious etiology is not excluded. Clinical correlation would be helpful. No radiopaque foreign bodies identified. IMPRESSION: Small posterior and plantar calcaneal spurs. Ankle mortise soft tissue swelling. Osteolytic process at the second metatarsophalangeal joint that could represent infection. Clinical correlation is recommended. Electronically Signed   By: Layla MawJoshua  Pleasure M.D.   On: 05/29/2022 17:25   DG Tibia/Fibula Left  Result Date: 05/29/2022 CLINICAL DATA:  Skin infection EXAM: LEFT FOOT - 2 VIEW; LEFT TIBIA AND FIBULA - 2 VIEW COMPARISON:  None Available. FINDINGS: Soft tissue swelling at the ankle mortise. No acute fracture, dislocation or subluxation. Small posterior and plantar calcaneal spurs. Osteolytic process at the second MTP joint. An infectious etiology is not excluded. Clinical correlation would be helpful. No radiopaque foreign bodies identified. IMPRESSION: Small posterior and plantar calcaneal spurs. Ankle mortise soft tissue swelling. Osteolytic process at the second metatarsophalangeal joint that could represent infection. Clinical correlation is recommended. Electronically Signed   By: Layla MawJoshua  Pleasure M.D.   On: 05/29/2022 17:25   US Venous Img Lower Unilateral Left  Result Date: 05/29/2022 CLINICAL DATA:  Left lower extremity swelling and redness for 3 weeks EXAM: LEFT LOWER EXTREMITY VENOUS DOPPLER ULTRASOUND TECHNIQUE: Gray-scale sonography with compression, as well as color and duplex ultrasound, were performed to evaluate the deep venous system(s) from the level of the common femoral vein through the popliteal and proximal calf veins. COMPARISON:  None  Available. FINDINGS: VENOUS Normal compressibility of the common femoral, superficial femoral, and popliteal veins, as well as the visualized calf veins. Visualized portions of profunda femoral vein and great saphenous vein unremarkable. No filling defects to suggest DVT on grayscale or color Doppler imaging. Doppler waveforms show normal direction of venous flow, normal respiratory plasticity and response to augmentation. Limited views of the contralateral common femoral vein are unremarkable. OTHER None. Limitations: none IMPRESSION: Negative for deep vein thrombosis in the left lower extremity. Electronically Signed   By: Jacob MooresMeghana  Konanur M.D.   On: 05/29/2022 13:53  Assessment/Plan Principal Problem:   Acute osteomyelitis of left foot Active Problems:   Hypertension   Uncontrolled type 2 diabetes mellitus with hyperglycemia   Osteomyelitis of foot, left, acute  Acute osteomyelitis of the left foot: Presented with left foot swelling/erythema.  Pain is minimal.Seen by PCP in the office and was sent to the ED for the suspicion for infection. MRI confirms the osteomyelitis of left foot with underlying abscess. History of stepping on the screw on last summer.  Previously underwent I&D for the same and was treated with antibiotics. Started on broad spectrum antibiotics, culture sent.  Patient will be transferred to [facility] for further intervention as per  Dr. Lajoyce Corners Continue pain management, supportive care  Hypertension: Apparently not taking any medication at home.  Hypertensive in the emergency department.  Continue prn medication for severe hypertension.  Started on amlodipine  Diabetes type 2: Takes insulin at home.  Looks like poorly controlled.  Will check hemoglobin A1c.  Continue sliding scae insulin for now.  Monitor blood sugars  History of prostate cancer: History of prostatectomy, currently in remission  History of diabetic peripheral neuropathy: Takes gabapentin  History of COPD? :   Patient declines any history of COPD.  He has noticed some intermittent wheezing recently.  He is a past smoker.  Currently saturating fine on room air.  Lungs were clear on auscultation.  Continue bronchodilators as needed       Severity of Illness: The appropriate patient status for this patient is INPATIENT. Inpatient status is judged to be reasonable and necessary in order to provide the required intensity of service to ensure the patient's safety. The patient's presenting symptoms, physical exam findings, and initial radiographic and laboratory data in the context of their chronic comorbidities is felt to place them at high risk for further clinical deterioration. Furthermore, it is not anticipated that the patient will be medically stable for discharge from the hospital within 2 midnights of admission.   DVT prophylaxis: Lovenox Code Status: Full Family Communication: None at bedside Consults called: Orthopedics     Burnadette Pop MD [facility]  05/30/2022, 8:47 AM

## 2022-05-30 NOTE — Progress Notes (Signed)
Pharmacy Antibiotic Note  Grant Yang is a 67 y.o. male admitted on 05/29/2022 with  osteomyelitis .  Pharmacy has been consulted for vancomycin and cefepime dosing.  Plan: Vancomycin 2000 mg IV x 1 dose. Vancomycin 1500 mg IV every 12 hours Cefepime 2000 mg IV every 8 hours. Monitor labs, c/s, and vanco levels as indicated.  Height: 5\' 11"  (180.3 cm) Weight: 94.3 kg (208 lb) IBW/kg (Calculated) : 75.3  Temp (24hrs), Avg:97.7 F (36.5 C), Min:97.5 F (36.4 C), Max:97.9 F (36.6 C)  Recent Labs  Lab 05/29/22 1506 05/29/22 1642  WBC 10.6*  --   CREATININE 0.76  --   LATICACIDVEN 0.7 0.7    Estimated Creatinine Clearance: 109.4 mL/min (by C-G formula based on SCr of 0.76 mg/dL).    Allergies  Allergen Reactions   Penicillins Other (See Comments)    Childhood allergy. Has patient had a PCN reaction causing immediate rash, facial/tongue/throat swelling, SOB or lightheadedness with hypotension: Unknown Has patient had a PCN reaction causing severe rash involving mucus membranes or skin necrosis: Unknown Has patient had a PCN reaction that required hospitalization: Unknown Has patient had a PCN reaction occurring within the last 10 years: No If all of the above answers are "NO", then may proceed with Cephalosporin use.  Other reaction(s): unknown    Antimicrobials this admission: Vanco 4/9 >> Cefepime 4/9 >> Flagyl 4/9 >>  Microbiology results: 4/9 BCx: pending   Thank you for allowing pharmacy to be a part of this patient's care.  Judeth Cornfield, PharmD Clinical Pharmacist 05/30/2022 8:26 AM

## 2022-05-30 NOTE — ED Provider Notes (Signed)
2:06 AM Assumed care from Dr. Durwin Nora, please see their note for full history, physical and decision making until this point. In brief this is a 67 y.o. year old male who presented to the ED tonight with Leg Swelling (/)     L foot Lajoyce Corners had worked on before. Now has osteomyelitis in L 2 MTP joint. Pending admit and Lajoyce Corners call back for admit.   D/w Dr. Lajoyce Corners, ok for [facility] admit. Prefers [facility] and a call when he gets there for evaluation. No further antibiotics recommended. Patient aware, comfortable, questions answered.   D/w Dr. Thomes Dinning for admit.   Labs, studies and imaging reviewed by myself and considered in medical decision making if ordered. Imaging interpreted by radiology.  Labs Reviewed  COMPREHENSIVE METABOLIC PANEL - Abnormal; Notable for the following components:      Result Value   Glucose, Bld 170 (*)    Albumin 3.3 (*)    AST 13 (*)    All other components within normal limits  CBC WITH DIFFERENTIAL/PLATELET - Abnormal; Notable for the following components:   WBC 10.6 (*)    Eosinophils Absolute 0.6 (*)    All other components within normal limits  LACTIC ACID, PLASMA  LACTIC ACID, PLASMA    MR FOOT LEFT W WO CONTRAST  Final Result    DG Foot 2 Views Left  Final Result    DG Tibia/Fibula Left  Final Result    US Venous Img Lower Unilateral Left  Final Result      No follow-ups on file.    Omeed Osuna, Barbara Cower, MD 05/30/22 [id number]

## 2022-05-31 ENCOUNTER — Other Ambulatory Visit: Payer: Self-pay

## 2022-05-31 ENCOUNTER — Encounter (HOSPITAL_COMMUNITY): Payer: Self-pay | Admitting: Internal Medicine

## 2022-05-31 ENCOUNTER — Inpatient Hospital Stay (HOSPITAL_COMMUNITY): Payer: 59 | Admitting: Anesthesiology

## 2022-05-31 ENCOUNTER — Encounter (HOSPITAL_COMMUNITY)
Admission: EM | Disposition: A | Discharge status: Home / Self Care | Payer: Self-pay | Source: Home / Self Care | Attending: Internal Medicine

## 2022-05-31 DIAGNOSIS — I10 Essential (primary) hypertension: Secondary | ICD-10-CM

## 2022-05-31 DIAGNOSIS — E1165 Type 2 diabetes mellitus with hyperglycemia: Secondary | ICD-10-CM

## 2022-05-31 DIAGNOSIS — M869 Osteomyelitis, unspecified: Secondary | ICD-10-CM

## 2022-05-31 DIAGNOSIS — E11628 Type 2 diabetes mellitus with other skin complications: Secondary | ICD-10-CM | POA: Diagnosis not present

## 2022-05-31 DIAGNOSIS — E1169 Type 2 diabetes mellitus with other specified complication: Secondary | ICD-10-CM | POA: Diagnosis not present

## 2022-05-31 DIAGNOSIS — M86172 Other acute osteomyelitis, left ankle and foot: Secondary | ICD-10-CM | POA: Diagnosis not present

## 2022-05-31 DIAGNOSIS — Z794 Long term (current) use of insulin: Secondary | ICD-10-CM

## 2022-05-31 DIAGNOSIS — Z87891 Personal history of nicotine dependence: Secondary | ICD-10-CM

## 2022-05-31 HISTORY — PX: I & D EXTREMITY: SHX5045

## 2022-05-31 HISTORY — PX: AMPUTATION: SHX166

## 2022-05-31 LAB — GLUCOSE, CAPILLARY
Glucose-Capillary: 168 mg/dL — ABNORMAL HIGH (ref 70–99)
Glucose-Capillary: 164 mg/dL — ABNORMAL HIGH (ref 70–99)
Glucose-Capillary: 170 mg/dL — ABNORMAL HIGH (ref 70–99)
Glucose-Capillary: 200 mg/dL — ABNORMAL HIGH (ref 70–99)
Glucose-Capillary: 145 mg/dL — ABNORMAL HIGH (ref 70–99)

## 2022-05-31 LAB — CBC
HCT: 38.4 % — ABNORMAL LOW (ref 39.0–52.0)
Hemoglobin: 12.4 g/dL — ABNORMAL LOW (ref 13.0–17.0)
MCH: 28.1 pg (ref 26.0–34.0)
MCHC: 32.3 g/dL (ref 30.0–36.0)
MCV: 86.9 fL (ref 80.0–100.0)
Platelets: 336 10*3/uL (ref 150–400)
RBC: 4.42 MIL/uL (ref 4.22–5.81)
RDW: 12.6 % (ref 11.5–15.5)
WBC: 9.3 10*3/uL (ref 4.0–10.5)
nRBC: 0 % (ref 0.0–0.2)

## 2022-05-31 LAB — BASIC METABOLIC PANEL
Anion gap: 9 (ref 5–15)
BUN: 13 mg/dL (ref 8–23)
CO2: 23 mmol/L (ref 22–32)
Calcium: 8.8 mg/dL — ABNORMAL LOW (ref 8.9–10.3)
Chloride: 104 mmol/L (ref 98–111)
Creatinine, Ser: 0.74 mg/dL (ref 0.61–1.24)
GFR, Estimated: >60 mL/min (ref >60)
Glucose, Bld: 148 mg/dL — ABNORMAL HIGH (ref 70–99)
Potassium: 3.9 mmol/L (ref 3.5–5.1)
Sodium: 136 mmol/L (ref 135–145)

## 2022-05-31 LAB — MRSA NEXT GEN BY PCR, NASAL: MRSA by PCR Next Gen: NOT DETECTED

## 2022-05-31 SURGERY — IRRIGATION AND DEBRIDEMENT EXTREMITY
Anesthesia: Monitor Anesthesia Care | Laterality: Left

## 2022-05-31 MED ORDER — ONDANSETRON HCL 4 MG/2ML IJ SOLN: 4.0000 mg | Freq: Four times a day (QID) | INTRAMUSCULAR | Status: DC | PRN

## 2022-05-31 MED ORDER — POLYETHYLENE GLYCOL 3350 17 G PO PACK: 17.0000 g | PACK | Freq: Every day | ORAL | Status: DC | PRN

## 2022-05-31 MED ORDER — PROPOFOL 10 MG/ML IV BOLUS
INTRAVENOUS | Status: AC
  Filled 2022-05-31: qty 20

## 2022-05-31 MED ORDER — HYDRALAZINE HCL 20 MG/ML IJ SOLN: 5.0000 mg | INTRAMUSCULAR | Status: DC | PRN

## 2022-05-31 MED ORDER — GUAIFENESIN-DM 100-10 MG/5ML PO SYRP: 15.0000 mL | ORAL_SOLUTION | ORAL | Status: DC | PRN

## 2022-05-31 MED ORDER — FENTANYL CITRATE (PF) 100 MCG/2ML IJ SOLN
INTRAMUSCULAR | Status: AC
  Filled 2022-05-31: qty 2

## 2022-05-31 MED ORDER — ACETAMINOPHEN 325 MG PO TABS: 325.0000 mg | ORAL_TABLET | Freq: Four times a day (QID) | ORAL | Status: DC | PRN

## 2022-05-31 MED ORDER — OXYCODONE HCL 5 MG PO TABS: 10.0000 mg | ORAL_TABLET | ORAL | Status: DC | PRN

## 2022-05-31 MED ORDER — VITAMIN C 500 MG PO TABS
1000.0000 mg | ORAL_TABLET | Freq: Every day | ORAL | Status: DC
  Administered 2022-05-31 – 2022-06-02 (×3): 1000 mg via ORAL
  Filled 2022-05-31 (×3): qty 2

## 2022-05-31 MED ORDER — FENTANYL CITRATE (PF) 100 MCG/2ML IJ SOLN
INTRAMUSCULAR | Status: DC | PRN
  Administered 2022-05-31: 50 ug via INTRAVENOUS

## 2022-05-31 MED ORDER — CLONIDINE HCL (ANALGESIA) 100 MCG/ML EP SOLN
EPIDURAL | Status: DC | PRN
  Administered 2022-05-31 (×2): 50 ug

## 2022-05-31 MED ORDER — ALUM & MAG HYDROXIDE-SIMETH 200-200-20 MG/5ML PO SUSP: 15.0000 mL | ORAL | Status: DC | PRN

## 2022-05-31 MED ORDER — LIDOCAINE 2% (20 MG/ML) 5 ML SYRINGE
INTRAMUSCULAR | Status: AC
  Filled 2022-05-31: qty 5

## 2022-05-31 MED ORDER — ORAL CARE MOUTH RINSE: 15.0000 mL | Freq: Once | OROMUCOSAL | Status: AC

## 2022-05-31 MED ORDER — DOCUSATE SODIUM 100 MG PO CAPS
100.0000 mg | ORAL_CAPSULE | Freq: Every day | ORAL | Status: DC
  Administered 2022-06-01: 100 mg via ORAL
  Filled 2022-05-31 (×2): qty 1

## 2022-05-31 MED ORDER — OXYCODONE HCL 5 MG PO TABS: 5.0000 mg | ORAL_TABLET | ORAL | Status: DC | PRN

## 2022-05-31 MED ORDER — PHENOL 1.4 % MT LIQD: 1.0000 | OROMUCOSAL | Status: DC | PRN

## 2022-05-31 MED ORDER — ONDANSETRON HCL 4 MG/2ML IJ SOLN
INTRAMUSCULAR | Status: AC
  Filled 2022-05-31: qty 2

## 2022-05-31 MED ORDER — HYDROMORPHONE HCL 1 MG/ML IJ SOLN: 0.5000 mg | INTRAMUSCULAR | Status: DC | PRN

## 2022-05-31 MED ORDER — BUPIVACAINE HCL (PF) 0.5 % IJ SOLN
INTRAMUSCULAR | Status: DC | PRN
  Administered 2022-05-31: 30 mL

## 2022-05-31 MED ORDER — MIDAZOLAM HCL 5 MG/5ML IJ SOLN
INTRAMUSCULAR | Status: DC | PRN
  Administered 2022-05-31: 2 mg via INTRAVENOUS

## 2022-05-31 MED ORDER — CHLORHEXIDINE GLUCONATE 0.12 % MT SOLN
OROMUCOSAL | Status: AC
  Administered 2022-05-31: 15 mL via OROMUCOSAL
  Filled 2022-05-31: qty 15

## 2022-05-31 MED ORDER — PROPOFOL 10 MG/ML IV BOLUS
INTRAVENOUS | Status: DC | PRN
  Administered 2022-05-31: 30 mg via INTRAVENOUS
  Administered 2022-05-31 (×5): 10 mg via INTRAVENOUS

## 2022-05-31 MED ORDER — ROPIVACAINE HCL 5 MG/ML IJ SOLN
INTRAMUSCULAR | Status: DC | PRN
  Administered 2022-05-31: 20 mL via PERINEURAL

## 2022-05-31 MED ORDER — 0.9 % SODIUM CHLORIDE (POUR BTL) OPTIME
TOPICAL | Status: DC | PRN
  Administered 2022-05-31: 1000 mL

## 2022-05-31 MED ORDER — JUVEN PO PACK
1.0000 | PACK | Freq: Two times a day (BID) | ORAL | Status: DC
  Administered 2022-05-31 – 2022-06-02 (×6): 1 via ORAL
  Filled 2022-05-31 (×6): qty 1

## 2022-05-31 MED ORDER — ZINC SULFATE 220 (50 ZN) MG PO CAPS
220.0000 mg | ORAL_CAPSULE | Freq: Every day | ORAL | Status: DC
  Administered 2022-05-31 – 2022-06-02 (×3): 220 mg via ORAL
  Filled 2022-05-31 (×3): qty 1

## 2022-05-31 MED ORDER — ACETAMINOPHEN 500 MG PO TABS
1000.0000 mg | ORAL_TABLET | Freq: Once | ORAL | Status: AC
  Administered 2022-05-31: 1000 mg via ORAL
  Filled 2022-05-31: qty 2

## 2022-05-31 MED ORDER — MAGNESIUM SULFATE 2 GM/50ML IV SOLN: 2.0000 g | Freq: Every day | INTRAVENOUS | Status: DC | PRN

## 2022-05-31 MED ORDER — SODIUM CHLORIDE 0.9 % IV SOLN: INTRAVENOUS | Status: DC

## 2022-05-31 MED ORDER — BISACODYL 5 MG PO TBEC: 5.0000 mg | DELAYED_RELEASE_TABLET | Freq: Every day | ORAL | Status: DC | PRN

## 2022-05-31 MED ORDER — CHLORHEXIDINE GLUCONATE 0.12 % MT SOLN: 15.0000 mL | Freq: Once | OROMUCOSAL | Status: AC

## 2022-05-31 MED ORDER — MIDAZOLAM HCL 2 MG/2ML IJ SOLN
INTRAMUSCULAR | Status: AC
  Filled 2022-05-31: qty 2

## 2022-05-31 MED ORDER — MAGNESIUM CITRATE PO SOLN: 1.0000 | Freq: Once | ORAL | Status: DC | PRN

## 2022-05-31 MED ORDER — LABETALOL HCL 5 MG/ML IV SOLN: 10.0000 mg | INTRAVENOUS | Status: DC | PRN

## 2022-05-31 MED ORDER — PANTOPRAZOLE SODIUM 40 MG PO TBEC
40.0000 mg | DELAYED_RELEASE_TABLET | Freq: Every day | ORAL | Status: DC
  Administered 2022-05-31 – 2022-06-02 (×3): 40 mg via ORAL
  Filled 2022-05-31 (×3): qty 1

## 2022-05-31 MED ORDER — LACTATED RINGERS IV SOLN: INTRAVENOUS | Status: DC

## 2022-05-31 MED ORDER — METOPROLOL TARTRATE 5 MG/5ML IV SOLN: 2.0000 mg | INTRAVENOUS | Status: DC | PRN

## 2022-05-31 MED ORDER — POTASSIUM CHLORIDE CRYS ER 20 MEQ PO TBCR: 20.0000 meq | EXTENDED_RELEASE_TABLET | Freq: Every day | ORAL | Status: DC | PRN

## 2022-05-31 SURGICAL SUPPLY — 38 items
BAG COUNTER SPONGE SURGICOUNT (BAG) ×1 IMPLANT
BLADE SURG 21 STRL SS (BLADE) ×1 IMPLANT
BNDG COHESIVE 4X5 TAN STRL (GAUZE/BANDAGES/DRESSINGS) ×1 IMPLANT
BNDG COHESIVE 6X5 TAN NS LF (GAUZE/BANDAGES/DRESSINGS) IMPLANT
BNDG COHESIVE 6X5 TAN ST LF (GAUZE/BANDAGES/DRESSINGS) IMPLANT
BNDG ESMARK 4X9 LF (GAUZE/BANDAGES/DRESSINGS) IMPLANT
BNDG GAUZE DERMACEA FLUFF 4 (GAUZE/BANDAGES/DRESSINGS) ×2 IMPLANT
COVER SURGICAL LIGHT HANDLE (MISCELLANEOUS) ×2 IMPLANT
DRAPE U-SHAPE 47X51 STRL (DRAPES) ×1 IMPLANT
DRSG ADAPTIC 3X8 NADH LF (GAUZE/BANDAGES/DRESSINGS) ×1 IMPLANT
DURAPREP 26ML APPLICATOR (WOUND CARE) ×1 IMPLANT
ELECT REM PT RETURN 9FT ADLT (ELECTROSURGICAL) ×1
ELECTRODE REM PT RTRN 9FT ADLT (ELECTROSURGICAL) ×1 IMPLANT
GAUZE PAD ABD 8X10 STRL (GAUZE/BANDAGES/DRESSINGS) ×1 IMPLANT
GAUZE SPONGE 4X4 12PLY STRL (GAUZE/BANDAGES/DRESSINGS) ×1 IMPLANT
GLOVE BIOGEL PI IND STRL 9 (GLOVE) ×1 IMPLANT
GLOVE SURG ORTHO 9.0 STRL STRW (GLOVE) ×1 IMPLANT
GOWN STRL REUS W/ TWL XL LVL3 (GOWN DISPOSABLE) ×2 IMPLANT
GOWN STRL REUS W/TWL XL LVL3 (GOWN DISPOSABLE) ×2
GRAFT SKIN MARIGEN MICRO 38 (Tissue) IMPLANT
HANDPIECE INTERPULSE COAX TIP (DISPOSABLE)
KIT BASIN OR (CUSTOM PROCEDURE TRAY) ×1 IMPLANT
KIT TURNOVER KIT B (KITS) ×1 IMPLANT
MANIFOLD NEPTUNE II (INSTRUMENTS) ×1 IMPLANT
NDL 22X1.5 STRL (OR ONLY) (MISCELLANEOUS) IMPLANT
NEEDLE 22X1.5 STRL (OR ONLY) (MISCELLANEOUS) IMPLANT
NS IRRIG 1000ML POUR BTL (IV SOLUTION) ×1 IMPLANT
PACK ORTHO EXTREMITY (CUSTOM PROCEDURE TRAY) ×1 IMPLANT
PAD ARMBOARD 7.5X6 YLW CONV (MISCELLANEOUS) ×2 IMPLANT
SET HNDPC FAN SPRY TIP SCT (DISPOSABLE) IMPLANT
STOCKINETTE IMPERVIOUS 9X36 MD (GAUZE/BANDAGES/DRESSINGS) IMPLANT
SUT ETHILON 2 0 PSLX (SUTURE) ×1 IMPLANT
SWAB COLLECTION DEVICE MRSA (MISCELLANEOUS) ×1 IMPLANT
SWAB CULTURE ESWAB REG 1ML (MISCELLANEOUS) IMPLANT
SYR CONTROL 10ML LL (SYRINGE) IMPLANT
TOWEL GREEN STERILE (TOWEL DISPOSABLE) ×1 IMPLANT
TUBE CONNECTING 12X1/4 (SUCTIONS) ×1 IMPLANT
YANKAUER SUCT BULB TIP NO VENT (SUCTIONS) ×1 IMPLANT

## 2022-05-31 NOTE — Progress Notes (Signed)
Orthopedic Tech Progress Note Patient Details:  Grant Yang [birth year] [id number]  Ortho Devices Type of Ortho Device: Postop shoe/boot Ortho Device/Splint Location: LLE Ortho Device/Splint Interventions: Ordered, Application, Removal   Post Interventions Patient Tolerated: Well Instructions Provided: Care of device  [name] 05/31/2022, 11:20 AM

## 2022-05-31 NOTE — Anesthesia Preprocedure Evaluation (Addendum)
Anesthesia Evaluation  Patient identified by MRN, date of birth, ID band Patient awake    Reviewed: Allergy & Precautions, NPO status , Patient's Chart, lab work & pertinent test results  Airway Mallampati: II  TM Distance: >3 FB Neck ROM: Full    Dental   Pulmonary former smoker   breath sounds clear to auscultation       Cardiovascular hypertension, Pt. on medications  Rhythm:Regular Rate:Normal     Neuro/Psych CVA    GI/Hepatic Neg liver ROS,GERD  ,,  Endo/Other  diabetes, Type 2, Insulin Dependent    Renal/GU negative Renal ROS     Musculoskeletal   Abdominal   Peds  Hematology negative hematology ROS (+)   Anesthesia Other Findings   Reproductive/Obstetrics                             Anesthesia Physical Anesthesia Plan  ASA: 3  Anesthesia Plan: Regional and MAC   Post-op Pain Management: Tylenol PO (pre-op)* and Regional block*   Induction:   PONV Risk Score and Plan: 1 and Propofol infusion and Ondansetron  Airway Management Planned: Natural Airway and Simple Face Mask  Additional Equipment:   Intra-op Plan:   Post-operative Plan:   Informed Consent: I have reviewed the patients History and Physical, chart, labs and discussed the procedure including the risks, benefits and alternatives for the proposed anesthesia with the patient or authorized representative who has indicated his/her understanding and acceptance.       Plan Discussed with: CRNA  Anesthesia Plan Comments:        Anesthesia Quick Evaluation

## 2022-05-31 NOTE — Plan of Care (Signed)
  RD consulted for nutrition education regarding diabetes.   Lab Results  Component Value Date   HGBA1C 14.3 (H) 05/29/2022   Reviewed patient's dietary recall. Breakfast - 2 eggs, 2-3 pieces of bacon, a slice of wheat bread, milk or coffee with coffee mate creamer or italian cream and honey Lunch - sandwich, soup or beanie weenies and crackers Dinner - hamburger steak, squash with onions and green beans Desserts - hostess cupcakes, honey buns, cookies, or 3-4 rice krispie treats Beverages - Diet [employer], Diet Dr. Reino Kent, Diet Cheerwine, Enterex Diabetic friendly Protein shakes  Patient reports that he usually checks his blood sugar about 1-2 hours after a meal. Discussed with pt that this is when his blood sugar will be at it's highest and encouraged him to check his blood sugar prior to meals.   Patient repots that he has been taking gummies endorsed by Dr. Neil Crouch to help resolve diabetes, though has not noticed a difference. Encouraged pt to continue to manage diabetes with diet modifications and continued blood sugar checks and use of insulin.   RD provided "Plate Method" and "Carbohydrate Counting for People with Diabetes" handout from the Academy of Nutrition and Dietetics. Discussed different food groups and their effects on blood sugar, emphasizing carbohydrate-containing foods. Provided list of carbohydrates and recommended serving sizes of common foods.   Emphasized carbohydrate servings and discussed ways to incorporate the carbohydrate containing foods he enjoys, such as his desserts, for more controlled blood sugars. Suggested he could try substituting one of his desserts with an Enterex protein shake as these contain protein which may help with blood sugar maintenance.    Discussed importance of controlled and consistent carbohydrate intake throughout the day. Provided examples of ways to balance meals/snacks and encouraged intake of high-fiber, whole grain complex carbohydrates.  Teach back method used.  Expect fair compliance.  Body mass index is 29.01 kg/m.   Current diet order is Carb Modified, patient is consuming approximately 100% of meals at this time. Labs and medications reviewed. Patient currently receiving Vitamin C 1000mg  daily, zinc 220mg  x14 days and Juven BID to support wound healing.   No further nutrition interventions warranted at this time. RD contact information provided. If additional nutrition issues arise, please re-consult RD.  Drusilla Kanner, RDN, LDN Clinical Nutrition

## 2022-05-31 NOTE — Inpatient Diabetes Management (Signed)
Inpatient Diabetes Program Recommendations  AACE/ADA: New Consensus Statement on Inpatient Glycemic Control (2015)  Target Ranges:  Prepandial:   less than 140 mg/dL      Peak postprandial:   less than 180 mg/dL (1-2 hours)      Critically ill patients:  140 - 180 mg/dL   Lab Results  Component Value Date   GLUCAP 170 (H) 05/31/2022   HGBA1C 14.3 (H) 05/29/2022    Review of Glycemic Control  Latest Reference Range & Units 05/31/22 11:34  Glucose-Capillary 70 - 99 mg/dL 193 (H)  (H): Data is abnormally high Diabetes history: DM 2 Outpatient Diabetes medications:  Novolin 70/30 30 units bid Current orders for Inpatient glycemic control:  Novolog 0-15 units tid with meals Semglee 20 units QD   Inpatient Diabetes Program Recommendations:    Spoke with patient again to reinforce concepts discussed at bedside with DM coordinator on 4/9.  Reviewed A1C again, sick day rules, survival skills, interventions, when to check CBGs and when to follow up with PCP. Patient plans to make appointment.  Patient has been reviewing LWWDM booklet and asking appropriate questions. Education provided on nutritional labels, basic carb counting and importance of incorporating foods that are higher in protein. Reviewed importance of looking at labels for beverages and nutritional supplements. Dietitian at bedside to also reinforce discussion.  No further questions at this time.   Thanks, Lujean Rave, MSN, RNC-OB Diabetes Coordinator [phone] (8a-5p)

## 2022-05-31 NOTE — Transfer of Care (Signed)
Immediate Anesthesia Transfer of Care Note  Patient: Grant Yang  Procedure(s) Performed: LEFT FOOT DEBRIDEMENT (Left) LEFT 2ND TOE AMPUTATION (Left)  Patient Location: PACU  Anesthesia Type:MAC combined with regional for post-op pain  Level of Consciousness: awake, alert , and oriented  Airway & Oxygen Therapy: Patient Spontanous Breathing  Post-op Assessment: Report given to RN and Post -op Vital signs reviewed and stable  Post vital signs: Reviewed and stable  Last Vitals:  Vitals Value Taken Time  BP 93/62 05/31/22 0906  Temp    Pulse 54 05/31/22 0911  Resp 17 05/31/22 0911  SpO2 94 % 05/31/22 0911  Vitals shown include unvalidated device data.  Last Pain:  Vitals:   05/30/22 2315  TempSrc:   PainSc: 4          Complications: No notable events documented.

## 2022-05-31 NOTE — Progress Notes (Signed)
PROGRESS NOTE    Grant Yang  FBP:[id number] DOB: May 29, 1955 DOA: 05/29/2022 PCP: Grant Lade, MD    Brief Narrative:  Grant Yang is a 67 y.o. male with past medical history significant for diabetes mellitus type 2, prostate cancer in remission, GERD, neuropathy who presented to the emergency department with complaint of left leg swelling and pain for 2 to 3 weeks.  Patient was seen by PCP in the office, sent to ED for further evaluation.  On presentation in the ED, patient was noted to be hypertensive., Lab work showed mild leukocytosis of 10.6.  Lactate level was normal. Left foot x-ray showed  osteolytic process at the second metatarsophalangeal suspect this for infection.  MRI of left foot showed bone marrow edema of the entire second metatarsal and proximal 2/3 of the proximal phalanx of the second digit consistent with osteomyelitis.Joint effusion at the second metatarsophalangeal joint suggesting septic arthritis.Marrow edema of the third metatarsal head suspicious for osteomyelitis.1.4 x 1.7 x 6.3 cm collection  consistent with abscess.  Orthopedic surgery was consulted and patient was transferred to [facility] from [facility]..    Assessment and Plan:  Acute osteomyelitis of the left foot status, post  debridement and second toe ray amputation 05/31/2022:  MRI confirms the osteomyelitis of left foot with underlying abscess. History of stepping on the screw on last summer.  Previously underwent I&D for the same and was treated with antibiotics.  Dr Lajoyce [facility] orthopedics has seen the patient today and patient has undergone left foot excisional debridement and Left second ray amputation.  Continue IV antibiotic analgesic regimen.  Orthopedic recommend 4-week of oral antibiotic depending upon tissue culture and sensitivity.  Recommend touchdown weightbearing on the left foot.  Plan is to follow-up in 1 week after discharge.   Essential hypertension: Not on medications  at home.  Has been started on amlodipine.     Diabetes melitis type 2 poorly controlled.  On insulin at home.  Latest hemoglobin A1c of 14.3.  Emphasized compliance with diet and insulin: Continue sliding scale insulin   history of prostate cancer: History of prostatectomy, currently in remission   History of diabetic peripheral neuropathy: Continue gabapentin.   Possible COPD.  Past smoker.  Recent wheezing.  Continue as needed bronchodilators.     DVT prophylaxis: SCD's Start: 05/31/22 0959 enoxaparin (LOVENOX) injection 40 mg Start: 05/30/22 2200   Code Status:     Code Status: Full Code  Disposition: Home likely in 1 to 2 days. Status is: Inpatient Remains inpatient appropriate because: Status post amputation of the foot, need for tissue culture,  Family Communication: None at bedside.  Consultants:  Orthopedics  Procedures:  LEFT FOOT EXCISIONAL DEBRIDEMENT, LEFT 2ND RAY AMPUTATION on 05/31/2022  Antimicrobials:  Vancomycin, cefepime and Rocephin.  Anti-infectives (From admission, onward)    Start     Dose/Rate Route Frequency Ordered Stop   05/30/22 1200  vancomycin (VANCOREADY) IVPB 1500 mg/300 mL        1,500 mg 150 mL/hr over 120 Minutes Intravenous Every 12 hours 05/30/22 0107     05/30/22 0900  metroNIDAZOLE (FLAGYL) IVPB 500 mg        500 mg 100 mL/hr over 60 Minutes Intravenous Every 12 hours 05/30/22 0813     05/30/22 0830  ceFEPIme (MAXIPIME) 2 g in sodium chloride 0.9 % 100 mL IVPB        2 g 200 mL/hr over 30 Minutes Intravenous Every 8 hours 05/30/22 0812  05/30/22 0115  vancomycin (VANCOREADY) IVPB 2000 mg/400 mL        2,000 mg 200 mL/hr over 120 Minutes Intravenous  Once 05/30/22 0102 05/30/22 0313      Subjective: Today, patient was seen and examined at bedside.  Patient denies any nausea, vomiting, fever, chills or rigor.  Seen after surgical intervention.  Objective: Vitals:   05/31/22 0907 05/31/22 0915 05/31/22 0930 05/31/22 0959   BP: 93/62 106/63 116/72 125/73  Pulse: (!) 55 61 (!) 54 (!) 56  Resp: 15 17 17 16   Temp: (!) 97.5 F (36.4 C)  (!) 97.5 F (36.4 C)   TempSrc:      SpO2: 95% 96% 96% 98%  Weight:      Height:        Intake/Output Summary (Last 24 hours) at 05/31/2022 1401 Last data filed at 05/31/2022 1100 Gross per 24 hour  Intake 1500 ml  Output 25 ml  Net 1475 ml   Filed Weights   05/29/22 1304  Weight: 94.3 kg    Physical Examination: Body mass index is 29.01 kg/m.   General:  Average built, not in obvious distress HENT:   No scleral pallor or icterus noted. Oral mucosa is moist.  Chest:  Clear breath sounds.  Diminished breath sounds bilaterally. No crackles or wheezes.  CVS: S1 &S2 heard. No murmur.  Regular rate and rhythm. Abdomen: Soft, nontender, nondistended.  Bowel sounds are heard.   Extremities: Left foot with secondary amputation covered with dressing. Psych: Alert, awake and oriented, normal mood CNS:  No cranial nerve deficits.  Power equal in all extremities.   Skin:  Left foot with secondary amputation covered with dressing.  Data Reviewed:   CBC: Recent Labs  Lab 05/29/22 1506 05/31/22 0149  WBC 10.6* 9.3  NEUTROABS 6.3  --   HGB 13.4 12.4*  HCT 40.8 38.4*  MCV 87.4 86.9  PLT 382 336    Basic Metabolic Panel: Recent Labs  Lab 05/29/22 1506 05/31/22 0149  NA 135 136  K 4.0 3.9  CL 101 104  CO2 27 23  GLUCOSE 170* 148*  BUN 19 13  CREATININE 0.76 0.74  CALCIUM 9.2 8.8*    Liver Function Tests: Recent Labs  Lab 05/29/22 1506  AST 13*  ALT 14  ALKPHOS 75  BILITOT 0.9  PROT 7.3  ALBUMIN 3.3*     Radiology Studies: MR FOOT LEFT W WO CONTRAST  Result Date: 05/29/2022 CLINICAL DATA:  Soft tissue infection suspected. EXAM: MRI OF THE LEFT FOREFOOT WITHOUT AND WITH CONTRAST TECHNIQUE: Multiplanar, multisequence MR imaging of the left forefoot was performed both before and after administration of intravenous contrast. CONTRAST:  66mL GADAVIST  GADOBUTROL 1 MMOL/ML IV SOLN COMPARISON:  Radiographs dated May 29, 2018. FINDINGS: Bones/Joint/Cartilage Bone marrow edema of the second metatarsal and proximal 2/3 of the proximal phalanx of the second digit. Joint effusion at the second metatarsophalangeal joint suggesting septic arthritis with associated osseous erosion of the metatarsal head and plantar subluxation. Bone marrow edema of the third metatarsal head. Ligaments Lisfranc ligament is intact. Collateral ligament insufficiency at the second metatarsophalangeal joint with plantar subluxation. Muscles and Tendons Increased intrasubstance signal of the plantar muscles suggesting diabetic myopathy/myositis. Soft tissues Deep skin wound about the plantar aspect of the second metatarsophalangeal joint. Peripherally enhancing fluid collection along the second metatarsal which measures at least 1.4 x 1.7 x 6.3 cm consistent with abscess. Generalized soft tissue edema about the dorsum of the foot suggesting cellulitis. IMPRESSION:  1. Bone marrow edema of the entire second metatarsal and proximal 2/3 of the proximal phalanx of the second digit consistent with osteomyelitis. 2. Joint effusion at the second metatarsophalangeal joint suggesting septic arthritis with associated osseous erosion and subluxation of the metatarsal head. 3. Marrow edema of the third metatarsal head, likely reactive secondary to altered mechanics, differential however also includes osteomyelitis. 4. Deep skin wound about the plantar aspect of the second metatarsophalangeal joint and a peripherally enhancing fluid collection along the second metatarsal which measures at least 1.4 x 1.7 x 6.3 cm consistent with abscess. 5. Increased intrasubstance signal of the plantar muscles suggesting diabetic myopathy/myositis. Electronically Signed   By: Larose HiresImran  Ahmed D.O.   On: 05/29/2022 23:22   DG Foot 2 Views Left  Result Date: 05/29/2022 CLINICAL DATA:  Skin infection EXAM: LEFT FOOT - 2 VIEW;  LEFT TIBIA AND FIBULA - 2 VIEW COMPARISON:  None Available. FINDINGS: Soft tissue swelling at the ankle mortise. No acute fracture, dislocation or subluxation. Small posterior and plantar calcaneal spurs. Osteolytic process at the second MTP joint. An infectious etiology is not excluded. Clinical correlation would be helpful. No radiopaque foreign bodies identified. IMPRESSION: Small posterior and plantar calcaneal spurs. Ankle mortise soft tissue swelling. Osteolytic process at the second metatarsophalangeal joint that could represent infection. Clinical correlation is recommended. Electronically Signed   By: Layla MawJoshua  Pleasure M.D.   On: 05/29/2022 17:25   DG Tibia/Fibula Left  Result Date: 05/29/2022 CLINICAL DATA:  Skin infection EXAM: LEFT FOOT - 2 VIEW; LEFT TIBIA AND FIBULA - 2 VIEW COMPARISON:  None Available. FINDINGS: Soft tissue swelling at the ankle mortise. No acute fracture, dislocation or subluxation. Small posterior and plantar calcaneal spurs. Osteolytic process at the second MTP joint. An infectious etiology is not excluded. Clinical correlation would be helpful. No radiopaque foreign bodies identified. IMPRESSION: Small posterior and plantar calcaneal spurs. Ankle mortise soft tissue swelling. Osteolytic process at the second metatarsophalangeal joint that could represent infection. Clinical correlation is recommended. Electronically Signed   By: Layla MawJoshua  Pleasure M.D.   On: 05/29/2022 17:25      LOS: 1 day    Grant DasLaxman Julliana Whitmyer, MD [facility] Available via Epic secure chat 7am-7pm After these hours, please refer to coverage provider listed on [email or web address] 05/31/2022, 2:01 PM

## 2022-05-31 NOTE — Consult Note (Signed)
ORTHOPAEDIC CONSULTATION  REQUESTING PHYSICIAN: Joycelyn DasPokhrel, Laxman, MD  Chief Complaint: Painful infection and cellulitis left foot.  HPI: Grant Yang is a [age] y.o. male who presents with cellulitis and swelling with pain left foot.  Patient is  Past Medical History:  Diagnosis Date   Cancer    prostate cancer    Diabetes mellitus    GERD (gastroesophageal reflux disease)    History of kidney stones    Hypertension    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 in [place] . denies any residual deficits    Past Surgical History:  Procedure Laterality Date   APPENDECTOMY     CHOLECYSTECTOMY  12/30/2010   Procedure: LAPAROSCOPIC CHOLECYSTECTOMY;  Surgeon: Dalia HeadingMark A Jenkins;  Location: [facility];  Service: General;  Laterality: N/A;   INCISION AND DRAINAGE ABSCESS Left 02/01/2017   Procedure: INCISION AND DRAINAGE ABSCESS LEFT HAND;  Surgeon: Vickki HearingHarrison, Stanley E, MD;  Location: [facility];  Service: Orthopedics;  Laterality: Left;   LYMPHADENECTOMY Bilateral 08/02/2017   Procedure: LYMPHADENECTOMY, PELVIC;  Surgeon: Heloise PurpuraBorden, Lester, MD;  Location: [facility];  Service: Urology;  Laterality: Bilateral;   ROBOT ASSISTED LAPAROSCOPIC RADICAL PROSTATECTOMY N/A 08/02/2017   Procedure: XI ROBOTIC ASSISTED LAPAROSCOPIC RADICAL PROSTATECTOMY LEVEL 2;  Surgeon: Heloise PurpuraBorden, Lester, MD;  Location: [facility];  Service: Urology;  Laterality: N/A;   Social History   Socioeconomic History   Marital status: Divorced    Spouse name: Not on file   Number of children: Not on file   Years of education: Not on file   Highest education level: Not on file  Occupational History   Not on file  Tobacco Use   Smoking status: Former    Packs/day: 2.00    Years: 38.00    Additional pack years: 0.00    Total pack  years: 76.00    Types: Cigarettes    Start date: 461972    Quit date: 2010    Years since quitting: 14.2   Smokeless tobacco: Never  Substance and Sexual Activity   Alcohol use: No   Drug use: Yes    Types: Marijuana    Comment: occ   Sexual activity: Yes  Other Topics Concern   Not on file  Social History Narrative   Not on file   Social Determinants of Health   Financial Resource Strain: Not on file  Food Insecurity: Not on file  Transportation Needs: Not on file  Physical Activity: Not on file  Stress: Not on file  Social Connections: Not on file   Family History  Problem Relation Age of Onset   Heart failure Mother    Cancer Father    - negative except otherwise stated in the family history section Allergies  Allergen Reactions   Penicillins Other (See Comments)    Childhood allergy. Has patient had a PCN reaction causing immediate rash, facial/tongue/throat swelling, SOB or lightheadedness with hypotension: Unknown Has patient had a PCN reaction causing severe rash involving mucus membranes or skin necrosis: Unknown Has patient had a PCN reaction that required hospitalization: Unknown Has patient had a PCN reaction occurring within the last 10 years: No If all of the above answers are "NO", then may proceed with Cephalosporin use.  Other  reaction(s): unknown   Prior to Admission medications   Medication Sig Start Date End Date Taking? Authorizing Provider  gabapentin (NEURONTIN) 300 MG capsule Take 1 capsule (300 mg total) by mouth 3 (three) times daily as needed. Patient taking differently: Take 300 mg by mouth at bedtime. 05/29/22  Yes Gardenia Phlegm, MD  insulin NPH-regular Human (NOVOLIN 70/30) (70-30) 100 UNIT/ML injection Inject 10 Units into the skin 2 (two) times daily with a meal. Patient taking differently: Inject 30 Units into the skin 2 (two) times daily with a meal. 02/03/17  Yes Langston Reusing, MD  albuterol (VENTOLIN HFA) 108 (90 Base) MCG/ACT  inhaler Inhale 2 puffs into the lungs every 6 (six) hours as needed for wheezing or shortness of breath. 05/29/22   Gardenia Phlegm, MD  blood glucose meter kit and supplies Dispense based on patient and insurance preference. Use up to four times daily as directed. (FOR ICD-10 E10.9, E11.9). 02/03/17   Langston Reusing, MD  Insulin Syringes, Disposable, U-100 0.3 ML MISC 10 Units by Does not apply route 2 (two) times daily with breakfast and lunch. 02/03/17   Langston Reusing, MD  metFORMIN (GLUCOPHAGE-XR) 500 MG 24 hr tablet Take 500 mg by mouth once daily x1 week, then increase to 500 mg twice daily x1 week, then 1,000 mg in the AM and 500 mg in the PM x1 week, and finally 1,000 mg twice daily. Take with food. Patient not taking: Reported on 05/30/2022 05/29/22   Gardenia Phlegm, MD   MR FOOT LEFT W WO CONTRAST  Result Date: 05/29/2022 CLINICAL DATA:  Soft tissue infection suspected. EXAM: MRI OF THE LEFT FOREFOOT WITHOUT AND WITH CONTRAST TECHNIQUE: Multiplanar, multisequence MR imaging of the left forefoot was performed both before and after administration of intravenous contrast. CONTRAST:  6mL GADAVIST GADOBUTROL 1 MMOL/ML IV SOLN COMPARISON:  Radiographs dated May 29, 2018. FINDINGS: Bones/Joint/Cartilage Bone marrow edema of the second metatarsal and proximal 2/3 of the proximal phalanx of the second digit. Joint effusion at the second metatarsophalangeal joint suggesting septic arthritis with associated osseous erosion of the metatarsal head and plantar subluxation. Bone marrow edema of the third metatarsal head. Ligaments Lisfranc ligament is intact. Collateral ligament insufficiency at the second metatarsophalangeal joint with plantar subluxation. Muscles and Tendons Increased intrasubstance signal of the plantar muscles suggesting diabetic myopathy/myositis. Soft tissues Deep skin wound about the plantar aspect of the second metatarsophalangeal joint. Peripherally enhancing fluid collection  along the second metatarsal which measures at least 1.4 x 1.7 x 6.3 cm consistent with abscess. Generalized soft tissue edema about the dorsum of the foot suggesting cellulitis. IMPRESSION: 1. Bone marrow edema of the entire second metatarsal and proximal 2/3 of the proximal phalanx of the second digit consistent with osteomyelitis. 2. Joint effusion at the second metatarsophalangeal joint suggesting septic arthritis with associated osseous erosion and subluxation of the metatarsal head. 3. Marrow edema of the third metatarsal head, likely reactive secondary to altered mechanics, differential however also includes osteomyelitis. 4. Deep skin wound about the plantar aspect of the second metatarsophalangeal joint and a peripherally enhancing fluid collection along the second metatarsal which measures at least 1.4 x 1.7 x 6.3 cm consistent with abscess. 5. Increased intrasubstance signal of the plantar muscles suggesting diabetic myopathy/myositis. Electronically Signed   By: Larose Hires D.O.   On: 05/29/2022 23:22   DG Foot 2 Views Left  Result Date: 05/29/2022 CLINICAL DATA:  Skin infection EXAM: LEFT FOOT - 2 VIEW; LEFT  TIBIA AND FIBULA - 2 VIEW COMPARISON:  None Available. FINDINGS: Soft tissue swelling at the ankle mortise. No acute fracture, dislocation or subluxation. Small posterior and plantar calcaneal spurs. Osteolytic process at the second MTP joint. An infectious etiology is not excluded. Clinical correlation would be helpful. No radiopaque foreign bodies identified. IMPRESSION: Small posterior and plantar calcaneal spurs. Ankle mortise soft tissue swelling. Osteolytic process at the second metatarsophalangeal joint that could represent infection. Clinical correlation is recommended. Electronically Signed   By: Layla Maw M.D.   On: 05/29/2022 17:25   DG Tibia/Fibula Left  Result Date: 05/29/2022 CLINICAL DATA:  Skin infection EXAM: LEFT FOOT - 2 VIEW; LEFT TIBIA AND FIBULA - 2 VIEW  COMPARISON:  None Available. FINDINGS: Soft tissue swelling at the ankle mortise. No acute fracture, dislocation or subluxation. Small posterior and plantar calcaneal spurs. Osteolytic process at the second MTP joint. An infectious etiology is not excluded. Clinical correlation would be helpful. No radiopaque foreign bodies identified. IMPRESSION: Small posterior and plantar calcaneal spurs. Ankle mortise soft tissue swelling. Osteolytic process at the second metatarsophalangeal joint that could represent infection. Clinical correlation is recommended. Electronically Signed   By: Layla Maw M.D.   On: 05/29/2022 17:25   US Venous Img Lower Unilateral Left  Result Date: 05/29/2022 CLINICAL DATA:  Left lower extremity swelling and redness for 3 weeks EXAM: LEFT LOWER EXTREMITY VENOUS DOPPLER ULTRASOUND TECHNIQUE: Gray-scale sonography with compression, as well as color and duplex ultrasound, were performed to evaluate the deep venous system(s) from the level of the common femoral vein through the popliteal and proximal calf veins. COMPARISON:  None Available. FINDINGS: VENOUS Normal compressibility of the common femoral, superficial femoral, and popliteal veins, as well as the visualized calf veins. Visualized portions of profunda femoral vein and great saphenous vein unremarkable. No filling defects to suggest DVT on grayscale or color Doppler imaging. Doppler waveforms show normal direction of venous flow, normal respiratory plasticity and response to augmentation. Limited views of the contralateral common femoral vein are unremarkable. OTHER None. Limitations: none IMPRESSION: Negative for deep vein thrombosis in the left lower extremity. Electronically Signed   By: Jacob Moores M.D.   On: 05/29/2022 13:53   - pertinent xrays, CT, MRI studies were reviewed and independently interpreted  Positive ROS: All other systems have been reviewed and were otherwise negative with the exception of those  mentioned in the HPI and as above.  Physical Exam: General: Alert, no acute distress Psychiatric: Patient is competent for consent with normal mood and affect Lymphatic: No axillary or cervical lymphadenopathy Cardiovascular: No pedal edema Respiratory: No cyanosis, no use of accessory musculature GI: No organomegaly, abdomen is soft and non-tender    Images:  @ENCIMAGES @  Labs:  Lab Results  Component Value Date   HGBA1C 14.3 (H) 05/29/2022   HGBA1C 10.4 (H) 01/30/2017   HGBA1C 12.3 (H) 12/28/2010   ESRSEDRATE 17 (H) 01/30/2017   CRP 6.6 (H) 01/30/2017   REPTSTATUS PENDING 05/30/2022   REPTSTATUS PENDING 05/30/2022   GRAMSTAIN  02/01/2017    MODERATE WBC PRESENT,BOTH PMN AND MONONUCLEAR FEW GRAM POSITIVE COCCI IN PAIRS AND CHAINS    CULT PENDING 05/30/2022   CULT PENDING 05/30/2022    Lab Results  Component Value Date   ALBUMIN 3.3 (L) 05/29/2022   ALBUMIN 3.1 (L) 02/03/2017   ALBUMIN 3.1 (L) 01/31/2017        Latest Ref Rng & Units 05/31/2022    1:49 AM 05/29/2022    3:06 PM  08/03/2017    5:24 AM  CBC EXTENDED  WBC 4.0 - 10.5 K/uL 9.3  10.6    RBC 4.22 - 5.81 MIL/uL 4.42  4.67    Hemoglobin 13.0 - 17.0 g/dL 16.1  09.6  04.5   HCT 39.0 - 52.0 % 38.4  40.8  39.5   Platelets 150 - 400 K/uL 336  382    NEUT# 1.7 - 7.7 K/uL  6.3    Lymph# 0.7 - 4.0 K/uL  2.7      Neurologic: Patient does not have protective sensation bilateral lower extremities.   MUSCULOSKELETAL:   Skin: Examination patient has cellulitis of the left foot with ascending cellulitis into the ankle.  Patient is status post previous surgical debridement of the left foot.  He has also undergone surgical debridement of the left hand.  Patient states that he was more active recently and developed redness pain and swelling.  Patient has palpable pulses anterior tibial on the left.  Review of the MRI scan shows osteomyelitis of the second metatarsal with edema surrounding the second  metatarsal.  Hemoglobin 12.4 white cell count 9.3.  Albumin 3.3 with a hemoglobin A1c of 14.3.  Assessment: Assessment: Uncontrolled type 2 diabetes with abscess and osteomyelitis second ray left foot.  Plan: Plan: Will plan for left foot second ray amputation.  Risks and benefits were discussed including recurrent infection need for additional surgery.  Patient states he understands wished to proceed at this time.  Thank you for the consult and the opportunity to see Mr. Pedrohenrique Mcconville, MD [facility] [phone] 7:20 AM

## 2022-05-31 NOTE — Op Note (Signed)
05/31/2022  9:07 AM  PATIENT:  Grant Yang    PRE-OPERATIVE DIAGNOSIS:  Abscess, Osteomyelitis Left Foot  POST-OPERATIVE DIAGNOSIS:  Same  PROCEDURE:  LEFT FOOT EXCISIONAL DEBRIDEMENT, LEFT 2ND RAY AMPUTATION Application Kerecis micro graft 38 cm.  SURGEON:  Nadara Mustard, MD  PHYSICIAN ASSISTANT:None ANESTHESIA:   General  PREOPERATIVE INDICATIONS:  Grant Yang is a  67 y.o. male with a diagnosis of Abscess, Osteomyelitis Left Foot who failed conservative measures and elected for surgical management.    The risks benefits and alternatives were discussed with the patient preoperatively including but not limited to the risks of infection, bleeding, nerve injury, cardiopulmonary complications, the need for revision surgery, among others, and the patient was willing to proceed.  OPERATIVE IMPLANTS:   Implant Name Type Inv. Item Serial No. Manufacturer Lot No. LRB No. Used Action  GRAFT SKIN MARIGEN MICRO 38 - [id number] Tissue GRAFT SKIN MARIGEN MICRO 38  [employer] [id number] Left 1 Implanted    @ENCIMAGES @  OPERATIVE FINDINGS: Patient had necrotic bone and necrotic soft tissue.  Tissues and bone sent for cultures.  OPERATIVE PROCEDURE: Patient was brought the operating room underwent a general anesthetic.  After adequate levels anesthesia obtained patient's left lower extremity was prepped using DuraPrep draped into a sterile field a timeout was called.  A V incision was made around the second ray this was carried down to the second metatarsal and the second metatarsal was resected through the base.  There is extensive amount of necrotic bone.  There was necrotic soft tissue and this was sent for cultures.  The excisional debridement was used to excise skin and soft tissue muscle and bone.  This was excised back to healthy viable margins.  The wound was irrigated with normal saline.  The wound was filled with Kerecis micro graft 38 cm.  The incision was closed using 2-0  nylon a sterile dressing was applied patient was taken the PACU in stable condition.   DISCHARGE PLANNING:  Antibiotic duration: Continue antibiotics based antibiotics on tissue culture sensitivities.  Patient will need 4 weeks of oral antibiotics.  Weightbearing: Touchdown weightbearing on the left  Pain medication: Opioid pathway  Dressing care/ Wound VAC: Dry dressing reinforce as needed  Ambulatory devices: Walker or crutches  Discharge to: Anticipate discharge to home.  Follow-up: In the office 1 week post operative.

## 2022-06-01 ENCOUNTER — Encounter (HOSPITAL_COMMUNITY): Payer: Self-pay | Admitting: Orthopedic Surgery

## 2022-06-01 DIAGNOSIS — M86172 Other acute osteomyelitis, left ankle and foot: Secondary | ICD-10-CM | POA: Diagnosis not present

## 2022-06-01 LAB — CBC
HCT: 36.4 % — ABNORMAL LOW (ref 39.0–52.0)
Hemoglobin: 11.8 g/dL — ABNORMAL LOW (ref 13.0–17.0)
MCH: 28.2 pg (ref 26.0–34.0)
MCHC: 32.4 g/dL (ref 30.0–36.0)
MCV: 87.1 fL (ref 80.0–100.0)
Platelets: 316 10*3/uL (ref 150–400)
RBC: 4.18 MIL/uL — ABNORMAL LOW (ref 4.22–5.81)
RDW: 12.6 % (ref 11.5–15.5)
WBC: 10.4 10*3/uL (ref 4.0–10.5)
nRBC: 0 % (ref 0.0–0.2)

## 2022-06-01 LAB — BASIC METABOLIC PANEL
Anion gap: 6 (ref 5–15)
BUN: 20 mg/dL (ref 8–23)
CO2: 25 mmol/L (ref 22–32)
Calcium: 8.8 mg/dL — ABNORMAL LOW (ref 8.9–10.3)
Chloride: 103 mmol/L (ref 98–111)
Creatinine, Ser: 0.9 mg/dL (ref 0.61–1.24)
GFR, Estimated: >60 mL/min (ref >60)
Glucose, Bld: 208 mg/dL — ABNORMAL HIGH (ref 70–99)
Potassium: 4.3 mmol/L (ref 3.5–5.1)
Sodium: 134 mmol/L — ABNORMAL LOW (ref 135–145)

## 2022-06-01 LAB — CULTURE, BLOOD (ROUTINE X 2): Culture: NO GROWTH

## 2022-06-01 LAB — SEDIMENTATION RATE: Sed Rate: 50 mm/hr — ABNORMAL HIGH (ref 0–16)

## 2022-06-01 LAB — GLUCOSE, CAPILLARY
Glucose-Capillary: 205 mg/dL — ABNORMAL HIGH (ref 70–99)
Glucose-Capillary: 187 mg/dL — ABNORMAL HIGH (ref 70–99)
Glucose-Capillary: 186 mg/dL — ABNORMAL HIGH (ref 70–99)
Glucose-Capillary: 158 mg/dL — ABNORMAL HIGH (ref 70–99)

## 2022-06-01 LAB — MAGNESIUM: Magnesium: 2 mg/dL (ref 1.7–2.4)

## 2022-06-01 LAB — AEROBIC/ANAEROBIC CULTURE W GRAM STAIN (SURGICAL/DEEP WOUND)

## 2022-06-01 LAB — C-REACTIVE PROTEIN: CRP: 0.6 mg/dL (ref <1.0)

## 2022-06-01 MED ORDER — SODIUM CHLORIDE 0.9 % IV SOLN
2.0000 g | INTRAVENOUS | Status: DC
  Administered 2022-06-01 – 2022-06-02 (×2): 2 g via INTRAVENOUS
  Filled 2022-06-01 (×2): qty 20

## 2022-06-01 NOTE — Anesthesia Postprocedure Evaluation (Signed)
Anesthesia Post Note  Patient: Grant Yang  Procedure(s) Performed: LEFT FOOT DEBRIDEMENT (Left) LEFT 2ND TOE AMPUTATION (Left)     Patient location during evaluation: PACU Anesthesia Type: Regional Level of consciousness: awake and alert Pain management: pain level controlled Vital Signs Assessment: post-procedure vital signs reviewed and stable Respiratory status: spontaneous breathing, nonlabored ventilation, respiratory function stable and patient connected to nasal cannula oxygen Cardiovascular status: stable and blood pressure returned to baseline Postop Assessment: no apparent nausea or vomiting Anesthetic complications: no   No notable events documented.  Last Vitals:  Vitals:   06/01/22 0355 06/01/22 0801  BP: 120/72 120/74  Pulse: 64 67  Resp: 15 16  Temp: 36.5 C 37.8 C  SpO2: 97% 100%    Last Pain:  Vitals:   06/01/22 0830  TempSrc:   PainSc: 4                  Kennieth Rad

## 2022-06-01 NOTE — Hospital Course (Addendum)
66 y.o.M  w/ T2DM. prostate cancer in remission, GERD, neuropathy who presented to ED with complaint of left leg swelling and pain for 2 to 3 weeks. hE  was seen by PCP in the office, sent to ED  In [id number]., labs- mild leukocytosis of 10.6.  Lactate level was normal. Left foot x-ray showed osteolytic process at the second metatarsophalangeal suspect this for infection.  MRI of left foot-showed osteomyelitis., oint effusion at the second metatarsophalangeal joint suggesting septic arthriti and also with abscess.  Orthopedic surgery was consulted and patient was transferred to [facility] from [facility]. He was seen by Dr. Lajoyce Corners underwent second amputation and debridement 4/10 cultures unremarkable seen by ID advised to continue oral antibiotics for 30 days and culture will be followed by ID, discussed with Dr. Lajoyce Corners: He advised to change prn for soiling at home otherwise fu in a week with Dr Lajoyce Corners for dressing change.

## 2022-06-01 NOTE — Inpatient Diabetes Management (Signed)
Inpatient Diabetes Program Recommendations  AACE/ADA: New Consensus Statement on Inpatient Glycemic Control (2015)  Target Ranges:  Prepandial:   less than 140 mg/dL      Peak postprandial:   less than 180 mg/dL (1-2 hours)      Critically ill patients:  140 - 180 mg/dL   Lab Results  Component Value Date   GLUCAP 205 (H) 06/01/2022   HGBA1C 14.3 (H) 05/29/2022    Review of Glycemic Control  Latest Reference Range & Units 05/31/22 11:34 05/31/22 16:20 05/31/22 20:54 06/01/22 07:59  Glucose-Capillary 70 - 99 mg/dL 660 (H) 600 (H) 459 (H) 205 (H)  (H): Data is abnormally high Diabetes history: DM 2 Outpatient Diabetes medications:  Novolin 70/30 30 units bid Current orders for Inpatient glycemic control:  Novolog 0-15 units tid with meals Semglee 20 units QD   Inpatient Diabetes Program Recommendations:    Consider increasing Semglee to 24 units QD.   Thanks, Lujean Rave, MSN, RNC-OB Diabetes Coordinator [phone] (8a-5p)

## 2022-06-01 NOTE — Progress Notes (Signed)
Physical Therapy Evaluation Patient Details Name: Grant Yang MRN: [id number] DOB: December 08, 1955 Today's Date: 06/01/2022  History of Present Illness  67 y.o. 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 with past medical history significant for diabetes mellitus type 2, prostate cancer in remission, GERD, neuropathy  Clinical Impression  Patient is s/p above surgery resulting in functional limitations due to the deficits listed below (see PT Problem List). Pt ambulating in room full WB on operative foot when PT entered room. Extensive education on precautions to allow for optimal healing of wound. Verbalized understanding and progressed with gait training using crutches and RW. Declines stair training with RW as he is confident with this device 2/2 use in the past but requested trial with crutches which required min assist. Pt agreeable to continued use of RW for support as this provided the most stability. Still with notable foot drop and numbness on Lt, presumably from nerve block. Patient will benefit from acute skilled PT to increase their independence and safety with mobility to facilitate discharge.        Recommendations for follow up therapy are one component of a multi-disciplinary discharge planning process, led by the attending physician.  Recommendations may be updated based on patient status, additional functional criteria and insurance authorization.     Assistance Recommended at Discharge PRN  Patient can return home with the following  Assistance with cooking/housework;Assist for transportation;Help with stairs or ramp for entrance    Equipment Recommendations None recommended by PT     Functional Status Assessment Patient has had a recent decline in their functional status and demonstrates the ability to make significant improvements in function in a reasonable and predictable amount of time.     Precautions / Restrictions  Precautions Precautions: Fall Required Braces or Orthoses:  (post op shoe) Restrictions Weight Bearing Restrictions: Yes LLE Weight Bearing: Touchdown weight bearing Other Position/Activity Restrictions: through heel only      Mobility  Bed Mobility Overal bed mobility: Modified Independent             General bed mobility comments: extra time    Transfers Overall transfer level: Needs assistance Equipment used: Rolling walker (2 wheels), Crutches Transfers: Sit to/from Stand Sit to Stand: Supervision           General transfer comment: Supervision for safety, cues for technique and precautions    Ambulation/Gait Ambulation/Gait assistance: Supervision Gait Distance (Feet): 175 Feet Assistive device: Rolling walker (2 wheels), Crutches Gait Pattern/deviations: Step-to pattern Gait velocity: decr Gait velocity interpretation: <1.8 ft/sec, indicate of risk for recurrent falls   General Gait Details: Educated on various AD use, including RW and crutches. Pt with notable drop foot on Lt likely from nerve block, reported not present pre-op. This causes pt to intermittently stumble. Cues for NWB due to trip hazard but able to manage TDWB after review. Prefers RW, as he was more stable.  Stairs Stairs: Yes Stairs assistance: Min assist Stair Management: No rails, Step to pattern, Forwards, With crutches Number of Stairs: 2 General stair comments: Min assist for balance, difficulty with Lt foot placement due to lack of sensation and control. Maintains TDWB. Educated on sequencing. Performed with crutches but agreeable to use RW instead as he has used this in the past for navigating stairs. Declines further practice.  Wheelchair Mobility    Modified Rankin (Stroke Patients Only)       Balance Overall balance assessment: Mild deficits observed, not formally tested (  with AD for support.)                                           Pertinent  Vitals/Pain Pain Assessment Pain Assessment: 0-10 Pain Score: 2  Pain Location: Lt foot Pain Descriptors / Indicators: Numbness Pain Intervention(s): Monitored during session, Repositioned    Home Living Family/patient expects to be discharged to:: Private residence Living Arrangements: Alone Available Help at Discharge: Friend(s) Type of Home: House Home Access: Stairs to enter Entrance Stairs-Rails: Right;Left;Can reach both Secretary/administrator of Steps: 1   Home Layout: One level Home Equipment: Agricultural consultant (2 wheels)      Prior Function Prior Level of Function : Independent/Modified Independent;Driving             Mobility Comments: ind no device ADLs Comments: ind     Hand Dominance   Dominant Hand: Right    Extremity/Trunk Assessment   Upper Extremity Assessment Upper Extremity Assessment: Defer to OT evaluation    Lower Extremity Assessment Lower Extremity Assessment: LLE deficits/detail LLE Deficits / Details: bandaged, some some slight saturation of blood through bandaging (Block likely still in effect, pt with drop foot, no sensation mid shin and below.) LLE Sensation: decreased light touch       Communication   Communication: No difficulties  Cognition Arousal/Alertness: Awake/alert Behavior During Therapy: WFL for tasks assessed/performed Overall Cognitive Status: Within Functional Limits for tasks assessed                                          General Comments General comments (skin integrity, edema, etc.): Some slight bleeding through bandages noted. Educated extensively on precautions.    Exercises     Assessment/Plan    PT Assessment Patient needs continued PT services  PT Problem List Decreased strength;Decreased range of motion;Decreased activity tolerance;Decreased balance;Decreased mobility;Decreased knowledge of use of DME;Pain;Impaired sensation;Decreased knowledge of precautions;Decreased safety  awareness       PT Treatment Interventions DME instruction;Gait training;Stair training;Functional mobility training;Therapeutic activities;Therapeutic exercise;Balance training;Neuromuscular re-education;Patient/family education;Modalities    PT Goals (Current goals can be found in the Care Plan section)  Acute Rehab PT Goals Patient Stated Goal: get well PT Goal Formulation: With patient Time For Goal Achievement: 06/15/22 Potential to Achieve Goals: Good    Frequency Min 5X/week     Co-evaluation               AM-PAC PT "6 Clicks" Mobility  Outcome Measure Help needed turning from your back to your side while in a flat bed without using bedrails?: None Help needed moving from lying on your back to sitting on the side of a flat bed without using bedrails?: None Help needed moving to and from a bed to a chair (including a wheelchair)?: A Little Help needed standing up from a chair using your arms (e.g., wheelchair or bedside chair)?: A Little Help needed to walk in hospital room?: A Little Help needed climbing 3-5 steps with a railing? : A Little 6 Click Score: 20    End of Session Equipment Utilized During Treatment: Gait belt Activity Tolerance: Patient tolerated treatment well Patient left: in bed;with call bell/phone within reach;with bed alarm set Nurse Communication: Mobility status PT Visit Diagnosis: Unsteadiness on feet (R26.81);Other abnormalities of gait and mobility (  R26.89);Difficulty in walking, not elsewhere classified (R26.2)    Time: [id number] PT Time Calculation (min) (ACUTE ONLY): 31 min   Charges:   PT Evaluation $PT Eval Low Complexity: 1 Low PT Treatments $Gait Training: 8-22 mins        Kathlyn SacramentoLogan Ericha Whittingham, PT, DPT Physical Therapist Acute Rehabilitation Services [facility] Outpatient Rehabilitation Services [facility]   Berton MountLogan S Thurston Brendlinger 06/01/2022, 9:47 AM

## 2022-06-01 NOTE — Progress Notes (Signed)
Patient ID: Grant Yang, male   DOB: 12-15-1955, 67 y.o.   MRN: [id number] Patient is a 67 year old gentleman who is postoperative day 1 left foot second ray amputation.  Patient may be touchdown weightbearing on the left heel.  Tissue was sent for cultures which are pending at this time.  Anticipate patient will need discharge on 4 weeks of oral antibiotics.

## 2022-06-01 NOTE — Progress Notes (Signed)
PROGRESS NOTE Grant Yang  QHU:[id number] DOB: May 14, 1955 DOA: 05/29/2022 PCP: Billie Lade, MD  Brief Narrative/Hospital Course: 67 y.o.M  w/ T2DM. prostate cancer in remission, GERD, neuropathy who presented to ED with complaint of left leg swelling and pain for 2 to 3 weeks. hE  was seen by PCP in the office, sent to ED   In [id number]., labs- mild leukocytosis of 10.6.  Lactate level was normal. Left foot x-ray showed osteolytic process at the second metatarsophalangeal suspect this for infection.  MRI of left foot-showed osteomyelitis., oint effusion at the second metatarsophalangeal joint suggesting septic arthriti and also with abscess.  Orthopedic surgery was consulted and patient was transferred to [facility] from [facility]      Subjective: Patient seen and examined this morning resting comfortably pain is stable.   Assessment and Plan: Principal Problem:   Acute osteomyelitis of left foot Active Problems:   Hypertension   Uncontrolled type 2 diabetes mellitus with hyperglycemia   Osteomyelitis of foot, left, acute   Acute osteomyelitis of the left foot Second metatarsophalangeal joint septic arthritis Abscess in left foot: Underwent left foot left 2nd ray amputation and Debridement by Dr. Lajoyce Corners 4/10.  Ortho advising 4 weeks of oral antibiotic depending upon tissue culture and sensitivity continue touchdown weightbearing on the left foot, follow-up with orthopedics in a few swarming, awaiting culture sensitivity report.  Continue pain control.  I will consult ID   Essential hypertension: Not on meds at home started on amlodipine.  BP stable  Diabetes melitis type 2 poorly controlled on long-term insulin:Latest hemoglobin A1c of 14.3.  Blood sugar control fairly, continue Semglee 20 units, SSI 0 to 15 units and monitor as below   history of prostate cancer: History of prostatectomy, currently in remission   History of diabetic peripheral neuropathy:  Continue gabapentin.   Possible COPD.  Past smoker.  Recent wheezing.  Continue as needed bronchodilators.  DVT prophylaxis: SCD's Start: 05/31/22 0959 enoxaparin (LOVENOX) injection 40 mg Start: 05/30/22 2200 Code Status:   Code Status: Full Code Family Communication: plan of care discussed with patient at bedside. Patient status is: Inpatient because of left foot infection Level of care: Med-Surg   Dispo: The patient is from: home            Anticipated disposition: TBD Objective: Vitals last 24 hrs: Vitals:   05/31/22 0959 05/31/22 1936 06/01/22 0355 06/01/22 0801  BP: 125/73 134/77 120/72 120/74  Pulse: (!) 56 64 64 67  Resp: 16 17 15 16   Temp:  97.6 F (36.4 C) 97.7 F (36.5 C) 100 F (37.8 C)  TempSrc:  Oral Oral Oral  SpO2: 98% 100% 97% 100%  Weight:      Height:       Weight change:   Physical Examination:  General exam: alert awake, older than stated age HEENT:Oral mucosa moist, Ear/Nose WNL grossly Respiratory system: bilaterally CLEAR  BS, no use of accessory muscle Cardiovascular system: S1 & S2 +, No JVD. Gastrointestinal system: Abdomen soft,NT,ND, BS+ Nervous System:Alert, awake, moving extremities. Extremities: LE edema NEG, RT FOOT second toe amputated diminished sensation on the toes reports from his neuropathy Skin: No rashes,no icterus. MSK: Normal muscle bulk,tone, power  Medications reviewed:  Scheduled Meds:  amLODipine  10 mg Oral Daily   vitamin C  1,000 mg Oral Daily   docusate sodium  100 mg Oral Daily   enoxaparin (LOVENOX) injection  40 mg Subcutaneous Q24H   insulin aspart  0-15  Units Subcutaneous TID WC   insulin glargine-yfgn  20 Units Subcutaneous Daily   nutrition supplement (JUVEN)  1 packet Oral BID BM   pantoprazole  40 mg Oral Daily   zinc sulfate  220 mg Oral Daily  Continuous Infusions:  sodium chloride 75 mL/hr at 06/01/22 0539   ceFEPime (MAXIPIME) IV 2 g (06/01/22 0828)   magnesium sulfate bolus IVPB      metronidazole 500 mg (06/01/22 0905)   vancomycin 150 mL/hr at 06/01/22 0540    Diet Order             Diet Carb Modified Fluid consistency: Thin; Room service appropriate? No  Diet effective now                   Intake/Output Summary (Last 24 hours) at 06/01/2022 1146 Last data filed at 05/31/2022 1500 Gross per 24 hour  Intake 1134.09 ml  Output --  Net 1134.09 ml   Net IO Since Admission: 3,009.09 mL [06/01/22 1146]  Wt Readings from Last 3 Encounters:  05/29/22 94.3 kg  05/29/22 94.4 kg  08/04/19 95.7 kg     Unresulted Labs (From admission, onward)    None     Data Reviewed: I have personally reviewed following labs and imaging studies CBC: Recent Labs  Lab 05/29/22 1506 05/31/22 0149 06/01/22 0135  WBC 10.6* 9.3 10.4  NEUTROABS 6.3  --   --   HGB 13.4 12.4* 11.8*  HCT 40.8 38.4* 36.4*  MCV 87.4 86.9 87.1  PLT 382 336 316   Basic Metabolic Panel: Recent Labs  Lab 05/29/22 1506 05/31/22 0149 06/01/22 0135  NA 135 136 134*  K 4.0 3.9 4.3  CL 101 104 103  CO2 27 23 25   GLUCOSE 170* 148* 208*  BUN 19 13 20   CREATININE 0.76 0.74 0.90  CALCIUM 9.2 8.8* 8.8*  MG  --   --  2.0   GFR: Estimated Creatinine Clearance: 97.2 mL/min (by C-G formula based on SCr of 0.9 mg/dL). Liver Function Tests: Recent Labs  Lab 05/29/22 1506  AST 13*  ALT 14  ALKPHOS 75  BILITOT 0.9  PROT 7.3  ALBUMIN 3.3*  HbA1C: Recent Labs    05/29/22 1506  HGBA1C 14.3*   CBG: Recent Labs  Lab 05/31/22 1134 05/31/22 1620 05/31/22 2054 06/01/22 0759 06/01/22 1134  GLUCAP 170* 200* 145* 205* 187*   Recent Labs  Lab 05/29/22 1506 05/29/22 1642  LATICACIDVEN 0.7 0.7    Recent Results (from the past 240 hour(s))  Culture, blood (Routine X 2) w Reflex to ID Panel     Status: None (Preliminary result)   Collection Time: 05/30/22  7:45 AM   Specimen: Left Antecubital; Blood  Result Value Ref Range Status   Specimen Description   Final    LEFT ANTECUBITAL  BOTTLES DRAWN AEROBIC AND ANAEROBIC   Special Requests   Final    Blood Culture results may not be optimal due to an excessive volume of blood received in culture bottles   Culture   Final    NO GROWTH 2 DAYS Performed at [facility], [address], [place], [place] [address]    Report Status PENDING  Incomplete  Culture, blood (Routine X 2) w Reflex to ID Panel     Status: None (Preliminary result)   Collection Time: 05/30/22  7:45 AM   Specimen: Right Antecubital; Blood  Result Value Ref Range Status   Specimen Description   Final    RIGHT  ANTECUBITAL BOTTLES DRAWN AEROBIC AND ANAEROBIC   Special Requests   Final    Blood Culture results may not be optimal due to an excessive volume of blood received in culture bottles   Culture   Final    NO GROWTH 2 DAYS Performed at [facility], [address] [place], [place] [address]    Report Status PENDING  Incomplete  Aerobic/Anaerobic Culture w Gram Stain (surgical/deep wound)     Status: None (Preliminary result)   Collection Time: 05/31/22  8:40 AM   Specimen: Soft Tissue, Other  Result Value Ref Range Status   Specimen Description ABSCESS  Final   Special Requests NONE  Final   Gram Stain   Final    FEW WBC PRESENT, PREDOMINANTLY PMN NO ORGANISMS SEEN   Culture   Final    CULTURE REINCUBATED FOR BETTER GROWTH Performed at [facility], [address] [place], [place] [address]    Report Status PENDING  Incomplete  MRSA Next Gen by PCR, Nasal     Status: None   Collection Time: 05/31/22  4:51 PM   Specimen: Nasal Mucosa; Nasal Swab  Result Value Ref Range Status   MRSA by PCR Next Gen NOT DETECTED NOT DETECTED Final    Comment: (NOTE) The GeneXpert MRSA Assay (FDA approved for NASAL specimens only), is one component of a comprehensive MRSA colonization surveillance program. It is not intended to diagnose MRSA infection nor to guide or monitor treatment for MRSA infections. Test performance is not FDA approved in  patients less than 65 years old. Performed at [facility], [address] [place], [place] [address]     Antimicrobials: Anti-infectives (From admission, onward)    Start     Dose/Rate Route Frequency Ordered Stop   05/30/22 1200  vancomycin (VANCOREADY) IVPB 1500 mg/300 mL        1,500 mg 150 mL/hr over 120 Minutes Intravenous Every 12 hours 05/30/22 0107     05/30/22 0900  metroNIDAZOLE (FLAGYL) IVPB 500 mg        500 mg 100 mL/hr over 60 Minutes Intravenous Every 12 hours 05/30/22 0813     05/30/22 0830  ceFEPIme (MAXIPIME) 2 g in sodium chloride 0.9 % 100 mL IVPB        2 g 200 mL/hr over 30 Minutes Intravenous Every 8 hours 05/30/22 0812     05/30/22 0115  vancomycin (VANCOREADY) IVPB 2000 mg/400 mL        2,000 mg 200 mL/hr over 120 Minutes Intravenous  Once 05/30/22 0102 05/30/22 0313      Culture/Microbiology    Component Value Date/Time   SDES ABSCESS 05/31/2022 0840   SPECREQUEST NONE 05/31/2022 0840   CULT  05/31/2022 0840    CULTURE REINCUBATED FOR BETTER GROWTH Performed at [facility], [address] [place], [place] [address]    REPTSTATUS PENDING 05/31/2022 0840   Radiology Studies: No results found.   LOS: 2 days   Lanae Boastamesh Josean Lycan, MD [facility]  06/01/2022, 11:46 AM

## 2022-06-01 NOTE — Consult Note (Signed)
[facility]                                                                                       Patient Identification: Patient Name: Grant Yang MRN: [id number] Admit Date: 05/29/2022  1:22 PM Today's Date: 06/01/2022 Reason for consult: concerns for osteomyelitis  Requesting provider: Dr Jonathon Bellows   Principal Problem:   Acute osteomyelitis of left foot Active Problems:   Hypertension   Uncontrolled type 2 diabetes mellitus with hyperglycemia   Osteomyelitis of foot, left, acute   Antibiotics:  Vancomycin 4/8-c Cefepime 4/9-c Metronidazole 4/9-c   Lines/Hardware:  Assessment 67 year old male with PMH as below including 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 OR cx incubating   Recommendations  Continue Vancomycin, pharmacy to dose, will switch cefepime to ceftriaxone, continue metronidazole  Fu blood cx and OR cx ( reincubating) for final recs  Needs BG control Monitor CBC and BMP   Rest of the management as per the primary team. Please call with questions or concerns.  Thank you for the consult  Odette Fraction, MD Infectious Disease Physician [facility] 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  Denies any recent injury.  He stepped on a screw which  penetrated his left forefoot last summer with resultant subsequentleft foot absces that was treated by I&D on 8/1 and was given a course of Bactrim.  Denies any fevers or chills. 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 Denies IVDU. Retired, lives alone.   ROS: General- Denies fever, chills, loss of appetite and loss of weight HEENT - Denies headache, blurry vision, neck pain, sinus pain Chest - Denies any chest pain, SOB or cough CVS- Denies any dizziness/lightheadedness, syncopal attacks, palpitations Abdomen- Denies any nausea, vomiting, abdominal pain, hematochezia and diarrhea Neuro - Denies any weakness, numbness, tingling sensation Psych - Denies any changes in mood irritability or depressive symptoms GU- Denies any burning, dysuria, hematuria or increased frequency of urination Skin - denies any rashes/lesions MSK - denies any joint pain/swelling or restricted ROM   Past Medical History:  Diagnosis Date   Cancer    prostate cancer    Diabetes mellitus    GERD (gastroesophageal reflux disease)    History of kidney stones    Hypertension  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 [place] in 2010 . denies any residual deficits    Past Surgical History:  Procedure Laterality Date   AMPUTATION Left 05/31/2022   Procedure: LEFT 2ND TOE AMPUTATION;  Surgeon: Nadara Mustard, MD;  Location: [facility] OR;  Service: Orthopedics;  Laterality: Left;   APPENDECTOMY     CHOLECYSTECTOMY  12/30/2010    Procedure: LAPAROSCOPIC CHOLECYSTECTOMY;  Surgeon: Dalia Heading;  Location: [facility] ORS;  Service: General;  Laterality: N/A;   I & D EXTREMITY Left 05/31/2022   Procedure: LEFT FOOT DEBRIDEMENT;  Surgeon: Nadara Mustard, MD;  Location: [facility] OR;  Service: Orthopedics;  Laterality: Left;   INCISION AND DRAINAGE ABSCESS Left 02/01/2017   Procedure: INCISION AND DRAINAGE ABSCESS LEFT HAND;  Surgeon: Vickki Hearing, MD;  Location: [facility] ORS;  Service: Orthopedics;  Laterality: Left;   LYMPHADENECTOMY Bilateral 08/02/2017   Procedure: LYMPHADENECTOMY, PELVIC;  Surgeon: Heloise Purpura, MD;  Location: [facility] ORS;  Service: Urology;  Laterality: Bilateral;   ROBOT ASSISTED LAPAROSCOPIC RADICAL PROSTATECTOMY N/A 08/02/2017   Procedure: XI ROBOTIC ASSISTED LAPAROSCOPIC RADICAL PROSTATECTOMY LEVEL 2;  Surgeon: Heloise Purpura, MD;  Location: [facility] ORS;  Service: Urology;  Laterality: N/A;    Scheduled Meds:  amLODipine  10 mg Oral Daily   vitamin C  1,000 mg Oral Daily   docusate sodium  100 mg Oral Daily   enoxaparin (LOVENOX) injection  40 mg Subcutaneous Q24H   insulin aspart  0-15 Units Subcutaneous TID WC   insulin glargine-yfgn  20 Units Subcutaneous Daily   nutrition supplement (JUVEN)  1 packet Oral BID BM   pantoprazole  40 mg Oral Daily   zinc sulfate  220 mg Oral Daily   Continuous Infusions:  sodium chloride 75 mL/hr at 06/01/22 0539   ceFEPime (MAXIPIME) IV 2 g (06/01/22 0828)   magnesium sulfate bolus IVPB     metronidazole 500 mg (06/01/22 0905)   vancomycin 150 mL/hr at 06/01/22 0540   PRN Meds:.acetaminophen, albuterol, alum & mag hydroxide-simeth, bisacodyl, guaiFENesin-dextromethorphan, hydrALAZINE, hydrALAZINE, HYDROcodone-acetaminophen, HYDROmorphone (DILAUDID) injection, labetalol, magnesium citrate, magnesium sulfate bolus IVPB, metoprolol tartrate, morphine injection, ondansetron, oxyCODONE, oxyCODONE, phenol, polyethylene glycol, potassium chloride  Allergies  Allergen Reactions    Penicillins Other (See Comments)    Childhood allergy. Has patient had a PCN reaction causing immediate rash, facial/tongue/throat swelling, SOB or lightheadedness with hypotension: Unknown Has patient had a PCN reaction causing severe rash involving mucus membranes or skin necrosis: Unknown Has patient had a PCN reaction that required hospitalization: Unknown Has patient had a PCN reaction occurring within the last 10 years: No If all of the above answers are "NO", then may proceed with Cephalosporin use.  Other reaction(s): unknown   Social History   Socioeconomic History   Marital status: Divorced    Spouse name: Not on file   Number of children: Not on file   Years of education: Not on file   Highest education level: Not on file  Occupational History   Not on file  Tobacco Use   Smoking status: Former    Packs/day: 2.00    Years: 38.00    Additional pack years: 0.00    Total pack years: 76.00    Types: Cigarettes  Start date: 59    Quit date: 2010    Years since quitting: 14.2   Smokeless tobacco: Never  Substance and Sexual Activity   Alcohol use: No   Drug use: Yes    Types: Marijuana    Comment: occ   Sexual activity: Yes  Other Topics Concern   Not on file  Social History Narrative   Not on file   Social Determinants of Health   Financial Resource Strain: Not on file  Food Insecurity: Not on file  Transportation Needs: Not on file  Physical Activity: Not on file  Stress: Not on file  Social Connections: Not on file  Intimate Partner Violence: Not on file   Family History  Problem Relation Age of Onset   Heart failure Mother    Cancer Father    Vitals BP 120/74 (BP Location: Left Arm)   Pulse 67   Temp 100 F (37.8 C) (Oral)   Resp 16   Ht  (1.803 m)   Wt 94.3 kg   SpO2 100%   BMI 29.01 kg/m   Physical Exam Constitutional:  adult male lying in the bed and not in acute distress    Comments:   Cardiovascular:     Rate and  Rhythm: Normal rate and regular rhythm.     Heart sounds: s1s2  Pulmonary:     Effort: Pulmonary effort is normal on RA    Comments: Normal breath sounds   Abdominal:     Palpations: Abdomen is soft.     Tenderness: non distended and non tender   Musculoskeletal:        General: No swelling or tenderness. Rt foot is warm and well perfused   Skin:    Comments: Left foot is wrapped in a surgical dreessing C/D/I  Neurological:     General: awake, alert and oriented, grossly non focal, follows commands   Psychiatric:        Mood and Affect: Mood normal.    Pertinent Microbiology Results for orders placed or performed during the hospital encounter of 05/29/22  Culture, blood (Routine X 2) w Reflex to ID Panel     Status: None (Preliminary result)   Collection Time: 05/30/22  7:45 AM   Specimen: Left Antecubital; Blood  Result Value Ref Range Status   Specimen Description   Final    LEFT ANTECUBITAL BOTTLES DRAWN AEROBIC AND ANAEROBIC   Special Requests   Final    Blood Culture results may not be optimal due to an excessive volume of blood received in culture bottles   Culture   Final    NO GROWTH 2 DAYS Performed at [facility], [address], [place], [place] [address]    Report Status PENDING  Incomplete  Culture, blood (Routine X 2) w Reflex to ID Panel     Status: None (Preliminary result)   Collection Time: 05/30/22  7:45 AM   Specimen: Right Antecubital; Blood  Result Value Ref Range Status   Specimen Description   Final    RIGHT ANTECUBITAL BOTTLES DRAWN AEROBIC AND ANAEROBIC   Special Requests   Final    Blood Culture results may not be optimal due to an excessive volume of blood received in culture bottles   Culture   Final    NO GROWTH 2 DAYS Performed at [facility], [address], [place], [place] [address]    Report Status PENDING  Incomplete  Aerobic/Anaerobic Culture w Gram Stain (surgical/deep wound)  Status: None (Preliminary result)   Collection  Time: 05/31/22  8:40 AM   Specimen: Soft Tissue, Other  Result Value Ref Range Status   Specimen Description ABSCESS  Final   Special Requests NONE  Final   Gram Stain   Final    FEW WBC PRESENT, PREDOMINANTLY PMN NO ORGANISMS SEEN   Culture   Final    CULTURE REINCUBATED FOR BETTER GROWTH Performed at [facility], [address] [place], [place] [address]    Report Status PENDING  Incomplete  MRSA Next Gen by PCR, Nasal     Status: None   Collection Time: 05/31/22  4:51 PM   Specimen: Nasal Mucosa; Nasal Swab  Result Value Ref Range Status   MRSA by PCR Next Gen NOT DETECTED NOT DETECTED Final    Comment: (NOTE) The GeneXpert MRSA Assay (FDA approved for NASAL specimens only), is one component of a comprehensive MRSA colonization surveillance program. It is not intended to diagnose MRSA infection nor to guide or monitor treatment for MRSA infections. Test performance is not FDA approved in patients less than 65 years old. Performed at [facility], [address] [place], [place] [address]      Pertinent Lab seen by me:    Latest Ref Rng & Units 06/01/2022    1:35 AM 05/31/2022    1:49 AM 05/29/2022    3:06 PM  CBC  WBC 4.0 - 10.5 K/uL 10.4  9.3  10.6   Hemoglobin 13.0 - 17.0 g/dL 09.811.8  11.912.4  14.713.4   Hematocrit 39.0 - 52.0 % 36.4  38.4  40.8   Platelets 150 - 400 K/uL 316  336  382       Latest Ref Rng & Units 06/01/2022    1:35 AM 05/31/2022    1:49 AM 05/29/2022    3:06 PM  CMP  Glucose 70 - 99 mg/dL 829208  562148  130170   BUN 8 - 23 mg/dL 20  13  19    Creatinine 0.61 - 1.24 mg/dL 8.650.90  7.840.74  6.960.76   Sodium 135 - 145 mmol/L 134  136  135   Potassium 3.5 - 5.1 mmol/L 4.3  3.9  4.0   Chloride 98 - 111 mmol/L 103  104  101   CO2 22 - 32 mmol/L 25  23  27    Calcium 8.9 - 10.3 mg/dL 8.8  8.8  9.2   Total Protein 6.5 - 8.1 g/dL   7.3   Total Bilirubin 0.3 - 1.2 mg/dL   0.9   Alkaline Phos 38 - 126 U/L   75   AST 15 - 41 U/L   13   ALT 0 - 44 U/L   14      Pertinent Imagings/Other Imagings Plain films and CT images have been personally visualized and interpreted; radiology reports have been reviewed. Decision making incorporated into the Impression / Recommendations.  MR FOOT LEFT W WO CONTRAST  Result Date: 05/29/2022 CLINICAL DATA:  Soft tissue infection suspected. EXAM: MRI OF THE LEFT FOREFOOT WITHOUT AND WITH CONTRAST TECHNIQUE: Multiplanar, multisequence MR imaging of the left forefoot was performed both before and after administration of intravenous contrast. CONTRAST:  10mL GADAVIST GADOBUTROL 1 MMOL/ML IV SOLN COMPARISON:  Radiographs dated May 29, 2018. FINDINGS: Bones/Joint/Cartilage Bone marrow edema of the second metatarsal and proximal 2/3 of the proximal phalanx of the second digit. Joint effusion at the second metatarsophalangeal joint suggesting septic arthritis with associated osseous erosion of  the metatarsal head and plantar subluxation. Bone marrow edema of the third metatarsal head. Ligaments Lisfranc ligament is intact. Collateral ligament insufficiency at the second metatarsophalangeal joint with plantar subluxation. Muscles and Tendons Increased intrasubstance signal of the plantar muscles suggesting diabetic myopathy/myositis. Soft tissues Deep skin wound about the plantar aspect of the second metatarsophalangeal joint. Peripherally enhancing fluid collection along the second metatarsal which measures at least 1.4 x 1.7 x 6.3 cm consistent with abscess. Generalized soft tissue edema about the dorsum of the foot suggesting cellulitis. IMPRESSION: 1. Bone marrow edema of the entire second metatarsal and proximal 2/3 of the proximal phalanx of the second digit consistent with osteomyelitis. 2. Joint effusion at the second metatarsophalangeal joint suggesting septic arthritis with associated osseous erosion and subluxation of the metatarsal head. 3. Marrow edema of the third metatarsal head, likely reactive secondary to altered  mechanics, differential however also includes osteomyelitis. 4. Deep skin wound about the plantar aspect of the second metatarsophalangeal joint and a peripherally enhancing fluid collection along the second metatarsal which measures at least 1.4 x 1.7 x 6.3 cm consistent with abscess. 5. Increased intrasubstance signal of the plantar muscles suggesting diabetic myopathy/myositis. Electronically Signed   By: Larose Hires D.O.   On: 05/29/2022 23:22   DG Foot 2 Views Left  Result Date: 05/29/2022 CLINICAL DATA:  Skin infection EXAM: LEFT FOOT - 2 VIEW; LEFT TIBIA AND FIBULA - 2 VIEW COMPARISON:  None Available. FINDINGS: Soft tissue swelling at the ankle mortise. No acute fracture, dislocation or subluxation. Small posterior and plantar calcaneal spurs. Osteolytic process at the second MTP joint. An infectious etiology is not excluded. Clinical correlation would be helpful. No radiopaque foreign bodies identified. IMPRESSION: Small posterior and plantar calcaneal spurs. Ankle mortise soft tissue swelling. Osteolytic process at the second metatarsophalangeal joint that could represent infection. Clinical correlation is recommended. Electronically Signed   By: Layla Maw M.D.   On: 05/29/2022 17:25   DG Tibia/Fibula Left  Result Date: 05/29/2022 CLINICAL DATA:  Skin infection EXAM: LEFT FOOT - 2 VIEW; LEFT TIBIA AND FIBULA - 2 VIEW COMPARISON:  None Available. FINDINGS: Soft tissue swelling at the ankle mortise. No acute fracture, dislocation or subluxation. Small posterior and plantar calcaneal spurs. Osteolytic process at the second MTP joint. An infectious etiology is not excluded. Clinical correlation would be helpful. No radiopaque foreign bodies identified. IMPRESSION: Small posterior and plantar calcaneal spurs. Ankle mortise soft tissue swelling. Osteolytic process at the second metatarsophalangeal joint that could represent infection. Clinical correlation is recommended. Electronically Signed   By:  Layla Maw M.D.   On: 05/29/2022 17:25   US Venous Img Lower Unilateral Left  Result Date: 05/29/2022 CLINICAL DATA:  Left lower extremity swelling and redness for 3 weeks EXAM: LEFT LOWER EXTREMITY VENOUS DOPPLER ULTRASOUND TECHNIQUE: Gray-scale sonography with compression, as well as color and duplex ultrasound, were performed to evaluate the deep venous system(s) from the level of the common femoral vein through the popliteal and proximal calf veins. COMPARISON:  None Available. FINDINGS: VENOUS Normal compressibility of the common femoral, superficial femoral, and popliteal veins, as well as the visualized calf veins. Visualized portions of profunda femoral vein and great saphenous vein unremarkable. No filling defects to suggest DVT on grayscale or color Doppler imaging. Doppler waveforms show normal direction of venous flow, normal respiratory plasticity and response to augmentation. Limited views of the contralateral common femoral vein are unremarkable. OTHER None. Limitations: none IMPRESSION: Negative for deep vein thrombosis in the left  lower extremity. Electronically Signed   By: Jacob Moores M.D.   On: 05/29/2022 13:53    I spent at least 85 minutes for this patient encounter including review of prior medical records/discussing diagnostics and treatment plan with the patient/family/coordinate care with primary/other specialits with greater than 50% of time in face to face encounter.   Electronically signed by:   Odette Fraction, MD Infectious Disease Physician [facility] Pager: [phone]

## 2022-06-02 ENCOUNTER — Other Ambulatory Visit (HOSPITAL_COMMUNITY): Payer: Self-pay

## 2022-06-02 DIAGNOSIS — E1169 Type 2 diabetes mellitus with other specified complication: Secondary | ICD-10-CM | POA: Diagnosis not present

## 2022-06-02 DIAGNOSIS — Z7984 Long term (current) use of oral hypoglycemic drugs: Secondary | ICD-10-CM | POA: Diagnosis not present

## 2022-06-02 DIAGNOSIS — M86172 Other acute osteomyelitis, left ankle and foot: Secondary | ICD-10-CM | POA: Diagnosis not present

## 2022-06-02 LAB — BASIC METABOLIC PANEL
Anion gap: 8 (ref 5–15)
BUN: 19 mg/dL (ref 8–23)
CO2: 25 mmol/L (ref 22–32)
Calcium: 8.9 mg/dL (ref 8.9–10.3)
Chloride: 105 mmol/L (ref 98–111)
Creatinine, Ser: 0.85 mg/dL (ref 0.61–1.24)
GFR, Estimated: >60 mL/min (ref >60)
Glucose, Bld: 172 mg/dL — ABNORMAL HIGH (ref 70–99)
Potassium: 3.6 mmol/L (ref 3.5–5.1)
Sodium: 138 mmol/L (ref 135–145)

## 2022-06-02 LAB — GLUCOSE, CAPILLARY
Glucose-Capillary: 196 mg/dL — ABNORMAL HIGH (ref 70–99)
Glucose-Capillary: 208 mg/dL — ABNORMAL HIGH (ref 70–99)
Glucose-Capillary: 217 mg/dL — ABNORMAL HIGH (ref 70–99)

## 2022-06-02 LAB — CULTURE, BLOOD (ROUTINE X 2)

## 2022-06-02 MED ORDER — DIPHENHYDRAMINE HCL 50 MG/ML IJ SOLN: 25.0000 mg | Freq: Once | INTRAMUSCULAR | Status: DC | PRN

## 2022-06-02 MED ORDER — DOXYCYCLINE MONOHYDRATE 100 MG PO TABS: 100.0000 mg | ORAL_TABLET | Freq: Two times a day (BID) | ORAL | 0 refills | Status: AC

## 2022-06-02 MED ORDER — AMOXICILLIN-POT CLAVULANATE 875-125 MG PO TABS: 1.0000 | ORAL_TABLET | Freq: Two times a day (BID) | ORAL | 0 refills | Status: AC

## 2022-06-02 MED ORDER — EPINEPHRINE 0.3 MG/0.3ML IJ SOAJ
0.3000 mg | Freq: Once | INTRAMUSCULAR | Status: DC | PRN
  Filled 2022-06-02: qty 0.3

## 2022-06-02 MED ORDER — AMOXICILLIN 500 MG PO CAPS
500.0000 mg | ORAL_CAPSULE | Freq: Once | ORAL | Status: AC
  Administered 2022-06-02: 500 mg via ORAL
  Filled 2022-06-02: qty 1

## 2022-06-02 MED ORDER — EPINEPHRINE 0.3 MG/0.3ML IJ SOAJ: 0.3000 mg | Freq: Once | INTRAMUSCULAR | 0 refills | Status: AC | PRN

## 2022-06-02 MED ORDER — INSULIN GLARGINE-YFGN 100 UNIT/ML ~~LOC~~ SOLN: 24.0000 [IU] | Freq: Every day | SUBCUTANEOUS | Status: DC

## 2022-06-02 MED ORDER — AMLODIPINE BESYLATE 10 MG PO TABS
10.0000 mg | ORAL_TABLET | Freq: Every day | ORAL | 0 refills | Status: DC
  Filled 2022-06-02: qty 30, 30d supply, fill #0

## 2022-06-02 NOTE — Progress Notes (Deleted)
PROGRESS NOTE Grant Yang  WUJ:[id number] DOB: 05/17/1955 DOA: 05/29/2022 PCP: Billie Lade, MD  Brief Narrative/Hospital Course: 67 y.o.M  w/ T2DM. prostate cancer in remission, GERD, neuropathy who presented to ED with complaint of left leg swelling and pain for 2 to 3 weeks. hE  was seen by PCP in the office, sent to ED   In NF:[id number]., labs- mild leukocytosis of 10.6.  Lactate level was normal. Left foot x-ray showed osteolytic process at the second metatarsophalangeal suspect this for infection.  MRI of left foot-showed osteomyelitis., oint effusion at the second metatarsophalangeal joint suggesting septic arthriti and also with abscess.  Orthopedic surgery was consulted and patient was transferred to [facility] from [facility]      Subjective: Seen and examined this morning resting comfortably has no complaints pain is controlled.  Report he saw Dr. 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 Lajoyce Corners 4/10.  Ortho advised 4 weeks of oral antibiotic depending upon tissue culture and sensitivity continue touchdown weightbearing on the left foot.  Remains on IV ceftriaxone vancomycin and Flagyl.  ID has been consulted for antibiotic recommendation and awaiting on culture sensitivity report.  Essential hypertension: Not on meds at home started on amlodipine.  Well-controlled   Diabetes melitis type 2 poorly controlled on long-term insulin:Latest hemoglobin A1c of 14.3.  Blood sugar controlled fairly, continue Semglee 20 units, SSI 0 to 15 units and monitor as below Recent Labs  Lab 05/29/22 1506 05/30/22 1131 06/01/22 1134  06/01/22 1602 06/01/22 2129 06/02/22 0727 06/02/22 1115  GLUCAP  --    < > 187* 186* 158* 196* 208*  HGBA1C 14.3*  --   --   --   --   --   --    < > = values in this interval not displayed.      history of prostate cancer:/History of prostatectomy, currently in remission.  Outpatient follow-up   History of diabetic peripheral neuropathy: Continue gabapentin.   Possible COPD.  Past smoker.  Recent wheezing.  Continue as needed bronchodilators.  DVT prophylaxis: SCD's Start: 05/31/22 0959 enoxaparin (LOVENOX) injection 40 mg Start: 05/30/22 2200 Code Status:   Code Status: Full Code Family Communication: plan of care discussed with patient at bedside. Patient status is: Inpatient because of left foot infection Level of care: Med-Surg   Dispo: The patient is from: home            Anticipated disposition: pending c/s Objective: Vitals last 24 hrs: Vitals:   06/01/22 0801 06/01/22 1345 06/01/22 1942 06/02/22 0729  BP: 120/74 (!) 165/90 139/84 (!) 140/80  Pulse: 67 81 71 63  Resp: Temp: 100 F (37.8 C) 97.7 F (36.5 C) 98.1 F (36.7 C) 97.9 F (36.6 C)  TempSrc: Oral Oral Oral Oral  SpO2: 100%  96% 99%  Weight:      Height:       Weight change:   Physical Examination: General exam: AA O X.3, weak,older appearing HEENT:Oral mucosa moist, Ear/Nose WNL grossly, dentition normal. Respiratory system: bilaterally clear BS, no use of accessory muscle Cardiovascular system: S1 & S2 +, regular rate. Gastrointestinal  system: Abdomen soft, NT,ND,BS+ Nervous System:Alert, awake, moving extremities and grossly nonfocal Extremities: RLE ankle edema neg, Left foot surgical site with dressing in place diminished sensation on the visible toes due to chronic neuropathy on left Skin: No rashes,no icterus. MSK: Normal muscle bulk,tone, power   Medications reviewed:  Scheduled Meds:  amLODipine  10 mg Oral Daily   vitamin C  1,000 mg Oral Daily   docusate sodium  100 mg  Oral Daily   enoxaparin (LOVENOX) injection  40 mg Subcutaneous Q24H   insulin aspart  0-15 Units Subcutaneous TID WC   insulin glargine-yfgn  20 Units Subcutaneous Daily   nutrition supplement (JUVEN)  1 packet Oral BID BM   pantoprazole  40 mg Oral Daily   zinc sulfate  220 mg Oral Daily  Continuous Infusions:  sodium chloride Stopped (06/02/22 0940)   cefTRIAXone (ROCEPHIN)  IV Stopped (06/01/22 1742)   magnesium sulfate bolus IVPB     metronidazole 100 mL/hr at 06/02/22 0941   vancomycin Stopped (06/02/22 0406)    Diet Order             Diet Carb Modified Fluid consistency: Thin; Room service appropriate? No  Diet effective now                   Intake/Output Summary (Last 24 hours) at 06/02/2022 1140 Last data filed at 06/02/2022 0941 Gross per 24 hour  Intake 3645.23 ml  Output 500 ml  Net 3145.23 ml    Net IO Since Admission: 6,154.32 mL [06/02/22 1140]  Wt Readings from Last 3 Encounters:  05/29/22 94.3 kg  05/29/22 94.4 kg  08/04/19 95.7 kg     Unresulted Labs (From admission, onward)     Start     Ordered   06/02/22 0500  Basic metabolic panel  Daily,   R      06/01/22 1150          Data Reviewed: I have personally reviewed following labs and imaging studies CBC: Recent Labs  Lab 05/29/22 1506 05/31/22 0149 06/01/22 0135  WBC 10.6* 9.3 10.4  NEUTROABS 6.3  --   --   HGB 13.4 12.4* 11.8*  HCT 40.8 38.4* 36.4*  MCV 87.4 86.9 87.1  PLT 382 336 316    Basic Metabolic Panel: Recent Labs  Lab 05/29/22 1506 05/31/22 0149 06/01/22 0135 06/02/22 0158  NA 135 136 134* 138  K 4.0 3.9 4.3 3.6  CL 101 104 103 105  CO2 GLUCOSE 170* 148* 208* 172*  BUN CREATININE 0.76 0.74 0.90 0.85  CALCIUM 9.2 8.8* 8.8* 8.9  MG  --   --  2.0  --     GFR: Estimated Creatinine Clearance: 102.9 mL/min (by C-G formula based on SCr of 0.85 mg/dL). Liver Function Tests: Recent Labs  Lab 05/29/22 1506  AST 13*  ALT 14  ALKPHOS  75  BILITOT 0.9  PROT 7.3  ALBUMIN 3.3*   HbA1C: No results for input(s): "HGBA1C" in the last 72 hours.  CBG: Recent Labs  Lab 06/01/22 1134 06/01/22 1602 06/01/22 2129 06/02/22 0727 06/02/22 1115  GLUCAP 187* 186* 158* 196* 208*    Recent Labs  Lab 05/29/22 1506 05/29/22 1642  LATICACIDVEN 0.7 0.7     Recent Results (from the past 240 hour(s))  Culture, blood (Routine X 2) w Reflex to ID Panel     Status: None (Preliminary result)   Collection Time: 05/30/22  7:45 AM   Specimen: Left Antecubital; Blood  Result Value Ref Range Status   Specimen Description   Final    LEFT ANTECUBITAL BOTTLES DRAWN AEROBIC AND ANAEROBIC   Special Requests   Final    Blood Culture results may not be optimal due to an excessive volume of blood received in culture bottles   Culture   Final    NO GROWTH 3 DAYS Performed at [facility], [address] [place], [place] [address]    Report Status PENDING  Incomplete  Culture, blood (Routine X 2) w Reflex to ID Panel     Status: None (Preliminary result)   Collection Time: 05/30/22  7:45 AM   Specimen: Right Antecubital; Blood  Result Value Ref Range Status   Specimen Description   Final    RIGHT ANTECUBITAL BOTTLES DRAWN AEROBIC AND ANAEROBIC   Special Requests   Final    Blood Culture results may not be optimal due to an excessive volume of blood received in culture bottles   Culture   Final    NO GROWTH 3 DAYS Performed at [facility], [address], [place], [place] [address]    Report Status PENDING  Incomplete  Aerobic/Anaerobic Culture w Gram Stain (surgical/deep wound)     Status: None (Preliminary result)   Collection Time: 05/31/22  8:40 AM   Specimen: Soft Tissue, Other  Result Value Ref Range Status   Specimen Description ABSCESS  Final   Special Requests NONE  Final   Gram Stain   Final    FEW WBC PRESENT, PREDOMINANTLY PMN NO ORGANISMS SEEN   Culture   Final    CULTURE REINCUBATED FOR BETTER GROWTH Performed at  [facility], [address], [place], [place] [address]    Report Status PENDING  Incomplete  MRSA Next Gen by PCR, Nasal     Status: None   Collection Time: 05/31/22  4:51 PM   Specimen: Nasal Mucosa; Nasal Swab  Result Value Ref Range Status   MRSA by PCR Next Gen NOT DETECTED NOT DETECTED Final    Comment: (NOTE) The GeneXpert MRSA Assay (FDA approved for NASAL specimens only), is one component of a comprehensive MRSA colonization surveillance program. It is not intended to diagnose MRSA infection nor to guide or monitor treatment for MRSA infections. Test performance is not FDA approved in patients less than 73 years old. Performed at [facility], [address] [place], [place] [address]     Antimicrobials: Anti-infectives (From admission, onward)    Start     Dose/Rate Route Frequency Ordered Stop   06/01/22 1600  cefTRIAXone (ROCEPHIN) 2 g in sodium chloride 0.9 % 100 mL IVPB        2 g 200 mL/hr over 30 Minutes Intravenous Every 24 hours 06/01/22 1235     05/30/22 1200  vancomycin (VANCOREADY) IVPB 1500 mg/300 mL        1,500 mg 150 mL/hr over 120 Minutes Intravenous Every 12 hours 05/30/22 0107     05/30/22 0900  metroNIDAZOLE (FLAGYL) IVPB 500 mg        500 mg 100 mL/hr over 60 Minutes Intravenous Every 12 hours 05/30/22 0813     05/30/22 0830  ceFEPIme (MAXIPIME) 2 g in sodium chloride 0.9 % 100 mL IVPB  Status:  Discontinued        2 g 200 mL/hr over 30 Minutes Intravenous Every 8 hours 05/30/22 0812 06/01/22 1235   05/30/22 0115  vancomycin (  VANCOREADY) IVPB 2000 mg/400 mL        2,000 mg 200 mL/hr over 120 Minutes Intravenous  Once 05/30/22 0102 05/30/22 0313      Culture/Microbiology    Component Value Date/Time   SDES ABSCESS 05/31/2022 0840   SPECREQUEST NONE 05/31/2022 0840   CULT  05/31/2022 0840    CULTURE REINCUBATED FOR BETTER GROWTH Performed at [facility], [address] [place], [place] [address]    REPTSTATUS PENDING  05/31/2022 0840   Radiology Studies: No results found.   LOS: 3 days   Lanae Boast, MD [facility]  06/02/2022, 11:40 AM

## 2022-06-02 NOTE — TOC Progression Note (Signed)
Discharge medication (1) are being stored in the main pharmacy on the ground floor until patient is ready for discharge.   

## 2022-06-02 NOTE — Plan of Care (Signed)
  Problem: Nutritional: Goal: Maintenance of adequate nutrition will improve Outcome: Progressing   Problem: Coping: Goal: Ability to adjust to condition or change in health will improve Outcome: Progressing   Problem: Fluid Volume: Goal: Ability to maintain a balanced intake and output will improve Outcome: Progressing   Problem: Nutritional: Goal: Maintenance of adequate nutrition will improve Outcome: Progressing

## 2022-06-02 NOTE — Progress Notes (Signed)
Pharmacy Antibiotic Note  Grant Yang is a 67 y.o. male admitted on 05/29/2022 with LLE  osteomyelitis  s/p I&D and L 2nd toe amputation on 4/10. Pharmacy has been consulted for vancomycin dosing.  Patient is also on Rocephin and Flagyl.  Renal function stable, afebrile, WBC WNL.  Plan: Vanc 1500mg  IV Q12H for AUC 472 using SCr 0.8 Rocephin 2g IV Q24H and Flagyl 500mg  IV Q12H per MD Monitor renal fxn, micro data, vanc levels soon if not yet transitioned to oral abx  Height: 5\' 11"  (180.3 cm) Weight: 94.3 kg (208 lb) IBW/kg (Calculated) : 75.3  Temp (24hrs), Avg:97.9 F (36.6 C), Min:97.7 F (36.5 C), Max:98.1 F (36.7 C)  Recent Labs  Lab 05/29/22 1506 05/29/22 1642 05/31/22 0149 06/01/22 0135 06/02/22 0158  WBC 10.6*  --  9.3 10.4  --   CREATININE 0.76  --  0.74 0.90 0.85  LATICACIDVEN 0.7 0.7  --   --   --      Estimated Creatinine Clearance: 102.9 mL/min (by C-G formula based on SCr of 0.85 mg/dL).    Allergies  Allergen Reactions   Penicillins Other (See Comments)    Childhood allergy. Has patient had a PCN reaction causing immediate rash, facial/tongue/throat swelling, SOB or lightheadedness with hypotension: Unknown Has patient had a PCN reaction causing severe rash involving mucus membranes or skin necrosis: Unknown Has patient had a PCN reaction that required hospitalization: Unknown Has patient had a PCN reaction occurring within the last 10 years: No If all of the above answers are "NO", then may proceed with Cephalosporin use.  Other reaction(s): unknown    Vanc 4/9 >> Cefepime 4/9 >> 4/11 Rocephin 4/11 >> Flagyl 4/9 >>   4/9 BCx: ngtd 4/9 BCx: ngtd 4/10 abscess -  4/10 MRSA PCR - negative  Marializ Ferrebee D. Laney Potash, PharmD, BCPS, BCCCP 06/02/2022, 9:38 AM

## 2022-06-02 NOTE — Discharge Summary (Signed)
Physician Discharge Summary  Grant Yang KDP:[id number] DOB: 06-18-1955 DOA: 05/29/2022  PCP: Billie Lade, MD  Admit date: 05/29/2022 Discharge date: 06/02/2022 Recommendations for Outpatient Follow-up:  Follow up with PCP in 1 weeks-call for appointment Please obtain BMP/CBC in one week  Discharge Dispo: Home Discharge Condition: Stable Code Status:   Code Status: Full Code Diet recommendation:  Diet Order             Diet Carb Modified Fluid consistency: Thin; Room service appropriate? No  Diet effective now                    Brief/Interim Summary: 67 y.o.M  w/ T2DM. prostate cancer in remission, GERD, neuropathy who presented to ED with complaint of left leg swelling and pain for 2 to 3 weeks. hE  was seen by PCP in the office, sent to ED  In ID:[id number]., labs- mild leukocytosis of 10.6.  Lactate level was normal. Left foot x-ray showed osteolytic process at the second metatarsophalangeal suspect this for infection.  MRI of left foot-showed osteomyelitis., oint effusion at the second metatarsophalangeal joint suggesting septic arthriti and also with abscess.  Orthopedic surgery was consulted and patient was transferred to [facility]. He was seen by Dr. Lajoyce Corners underwent second amputation and debridement 4/10 cultures unremarkable seen by ID advised to continue oral antibiotics for 30 days and culture will be followed by ID, discussed with Dr. Lajoyce Corners: He advised to change prn for soiling at home otherwise fu in a week with Dr Lajoyce Corners for dressing change.    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 Lajoyce Corners 4/10.   Ortho advised 4 weeks of oral antibiotic depending upon tissue culture and sensitivity continue touchdown weightbearing on the left foot.  Remains on IV ceftriaxone vancomycin and Flagyl.  seen by ID advised to continue oral antibiotics for 30 days and culture will be followed by ID, discussed with Dr. Lajoyce Corners: He advised to change prn for soiling at home otherwise fu in a week with Dr Lajoyce Corners for dressing change. Due to penicillin allergy as childhood- ID arranged for po challenge inpatient and if tolerates then dc home later today.  Essential hypertension: Not on meds at home started on amlodipine.  Well-controlled   Diabetes melitis type 2 poorly controlled on long-term insulin:Latest hemoglobin A1c of 14.3.  Blood sugar controlled fairly, continue Semglee 20 units, SSI 0 to 15 units and monitor as below-resume home meds. Recent Labs  Lab 05/29/22 1506 05/30/22 1131 06/01/22 1602 06/01/22 2129 06/02/22 0727 06/02/22 1115 06/02/22 1610  GLUCAP  --    < > 186* 158* 196* 208* 217*  HGBA1C 14.3*  --   --   --   --   --   --    < > = values in this interval not displayed.     history of prostate cancer:/History of prostatectomy, currently in remission.  Outpatient follow-up History of diabetic peripheral neuropathy: Continue gabapentin. Possible COPD.  Past smoker.  Recent wheezing.  Continue as needed bronchodilators.  Consults: ID PODIATRY  Subjective: AAOX3  Discharge Exam: Vitals:   06/02/22 0729 06/02/22 1400  BP: (!) 140/80 Marland Kitchen)  152/81  Pulse: 63 72  Resp: 16 16  Temp: 97.9 F (36.6 C) 98.5 F (36.9 C)  SpO2: 99% 100%   General: Pt is alert, awake, not in acute distress Cardiovascular: RRR, S1/S2 +, no rubs, no gallops Respiratory: CTA bilaterally, no wheezing, no rhonchi Abdominal: Soft, NT, ND, bowel sounds + Extremities: no edema, no cyanosis  Discharge Instructions  Discharge Instructions     Ambulatory Referral for Lung Cancer Scre   Complete by: As directed     Discharge instructions   Complete by: As directed    If you notice any swelling foul-smelling drainage from your foot please call Dr. Lajoyce Corners IMMEDIATELY to be seen  Please call call MD or return to ER for similar or worsening recurring problem that brought you to hospital or if any fever,nausea/vomiting,abdominal pain, uncontrolled pain, chest pain,  shortness of breath or any other alarming symptoms.  Please follow-up your doctor as instructed in a week time and call the office for appointment.  Please avoid alcohol, smoking, or any other illicit substance and maintain healthy habits including taking your regular medications as prescribed.  You were cared for by a hospitalist during your hospital stay. If you have any questions about your discharge medications or the care you received while you were in the hospital after you are discharged, you can call the unit and ask to speak with the hospitalist on call if the hospitalist that took care of you is not available.  Once you are discharged, your primary care physician will handle any further medical issues. Please note that NO REFILLS for any discharge medications will be authorized once you are discharged, as it is imperative that you return to your primary care physician (or establish a relationship with a primary care physician if you do not have one) for your aftercare needs so that they can reassess your need for medications and monitor your lab values   Increase activity slowly   Complete by: As directed       Allergies as of 06/02/2022       Reactions   Penicillins Other (See Comments)   Childhood allergy. Has patient had a PCN reaction causing immediate rash, facial/tongue/throat swelling, SOB or lightheadedness with hypotension: Unknown Has patient had a PCN reaction causing severe rash involving mucus membranes or skin necrosis: Unknown Has patient had a PCN reaction that required hospitalization: Unknown Has patient had a PCN  reaction occurring within the last 10 years: No If all of the above answers are "NO", then may proceed with Cephalosporin use. Other reaction(s): unknown        Medication List     TAKE these medications    albuterol 108 (90 Base) MCG/ACT inhaler Commonly known as: VENTOLIN HFA Inhale 2 puffs into the lungs every 6 (six) hours as needed for wheezing or shortness of breath.   amLODipine 10 MG tablet Commonly known as: NORVASC Take 1 tablet (10 mg total) by mouth daily. Start taking on: June 03, 2022   amoxicillin-clavulanate 875-125 MG tablet Commonly known as: AUGMENTIN Take 1 tablet by mouth 2 (two) times daily for 28 days.   blood glucose meter kit and supplies Dispense based on patient and insurance preference. Use up to four times daily as directed. (FOR ICD-10 E10.9, E11.9).   doxycycline 100 MG tablet Commonly known as: ADOXA Take 1 tablet (100 mg total) by mouth 2 (two) times daily for 28 days.   EPINEPHrine 0.3 mg/0.3 mL Soaj injection Commonly known as: EPI-PEN  Inject 0.3 mg into the muscle once as needed for up to 1 dose (if patient exhibits significant signs and symptoms of allergic reaction.).   gabapentin 300 MG capsule Commonly known as: NEURONTIN Take 1 capsule (300 mg total) by mouth 3 (three) times daily as needed. What changed: when to take this   insulin NPH-regular Human (70-30) 100 UNIT/ML injection Commonly known as: NovoLIN 70/30 Inject 10 Units into the skin 2 (two) times daily with a meal. What changed: how much to take   Insulin Syringes (Disposable) U-100 0.3 ML Misc 10 Units by Does not apply route 2 (two) times daily with breakfast and lunch.   metFORMIN 500 MG 24 hr tablet Commonly known as: GLUCOPHAGE-XR Take 500 mg by mouth once daily x1 week, then increase to 500 mg twice daily x1 week, then 1,000 mg in the AM and 500 mg in the PM x1 week, and finally 1,000 mg twice daily. Take with food.         Follow-up Information      Nadara Mustard, MD Follow up in 1 week(s).   Specialty: Orthopedic Surgery Contact information: [address] [place] [address] [phone]         Billie Lade, MD Follow up in 1 week(s).   Specialty: Internal Medicine Contact information: [address] [place] [address] [phone]                Allergies  Allergen Reactions   Penicillins Other (See Comments)    Childhood allergy. Has patient had a PCN reaction causing immediate rash, facial/tongue/throat swelling, SOB or lightheadedness with hypotension: Unknown Has patient had a PCN reaction causing severe rash involving mucus membranes or skin necrosis: Unknown Has patient had a PCN reaction that required hospitalization: Unknown Has patient had a PCN reaction occurring within the last 10 years: No If all of the above answers are "NO", then may proceed with Cephalosporin use.  Other reaction(s): unknown    The results of significant diagnostics from this hospitalization (including imaging, microbiology, ancillary and laboratory) are listed below for reference.    Microbiology: Recent Results (from the past 240 hour(s))  Culture, blood (Routine X 2) w Reflex to ID Panel     Status: None (Preliminary result)   Collection Time: 05/30/22  7:45 AM   Specimen: Left Antecubital; Blood  Result Value Ref Range Status   Specimen Description   Final    LEFT ANTECUBITAL BOTTLES DRAWN AEROBIC AND ANAEROBIC   Special Requests   Final    Blood Culture results may not be optimal due to an excessive volume of blood received in culture bottles   Culture   Final    NO GROWTH 3 DAYS Performed at [facility], [address] [place], [place] [address]    Report Status PENDING  Incomplete  Culture, blood (Routine X 2) w Reflex to ID Panel     Status: None (Preliminary result)   Collection Time: 05/30/22  7:45 AM   Specimen: Right Antecubital; Blood  Result Value Ref Range Status   Specimen  Description   Final    RIGHT ANTECUBITAL BOTTLES DRAWN AEROBIC AND ANAEROBIC   Special Requests   Final    Blood Culture results may not be optimal due to an excessive volume of blood received in culture bottles   Culture   Final    NO GROWTH 3 DAYS Performed at [facility], [address], [place], [place] [address]  Report Status PENDING  Incomplete  Aerobic/Anaerobic Culture w Gram Stain (surgical/deep wound)     Status: None (Preliminary result)   Collection Time: 05/31/22  8:40 AM   Specimen: Soft Tissue, Other  Result Value Ref Range Status   Specimen Description ABSCESS  Final   Special Requests NONE  Final   Gram Stain   Final    FEW WBC PRESENT, PREDOMINANTLY PMN NO ORGANISMS SEEN Performed at [facility], [address], [place], [place] [address]    Culture   Final    CULTURE REINCUBATED FOR BETTER GROWTH NO ANAEROBES ISOLATED; CULTURE IN PROGRESS FOR 5 DAYS    Report Status PENDING  Incomplete  MRSA Next Gen by PCR, Nasal     Status: None   Collection Time: 05/31/22  4:51 PM   Specimen: Nasal Mucosa; Nasal Swab  Result Value Ref Range Status   MRSA by PCR Next Gen NOT DETECTED NOT DETECTED Final    Comment: (NOTE) The GeneXpert MRSA Assay (FDA approved for NASAL specimens only), is one component of a comprehensive MRSA colonization surveillance program. It is not intended to diagnose MRSA infection nor to guide or monitor treatment for MRSA infections. Test performance is not FDA approved in patients less than 65 years old. Performed at [facility], [address], [place], [place] [address]     Procedures/Studies: MR FOOT LEFT W WO CONTRAST  Result Date: 05/29/2022 CLINICAL DATA:  Soft tissue infection suspected. EXAM: MRI OF THE LEFT FOREFOOT WITHOUT AND WITH CONTRAST TECHNIQUE: Multiplanar, multisequence MR imaging of the left forefoot was performed both before and after administration of intravenous contrast. CONTRAST:  10mL GADAVIST GADOBUTROL  1 MMOL/ML IV SOLN COMPARISON:  Radiographs dated May 29, 2018. FINDINGS: Bones/Joint/Cartilage Bone marrow edema of the second metatarsal and proximal 2/3 of the proximal phalanx of the second digit. Joint effusion at the second metatarsophalangeal joint suggesting septic arthritis with associated osseous erosion of the metatarsal head and plantar subluxation. Bone marrow edema of the third metatarsal head. Ligaments Lisfranc ligament is intact. Collateral ligament insufficiency at the second metatarsophalangeal joint with plantar subluxation. Muscles and Tendons Increased intrasubstance signal of the plantar muscles suggesting diabetic myopathy/myositis. Soft tissues Deep skin wound about the plantar aspect of the second metatarsophalangeal joint. Peripherally enhancing fluid collection along the second metatarsal which measures at least 1.4 x 1.7 x 6.3 cm consistent with abscess. Generalized soft tissue edema about the dorsum of the foot suggesting cellulitis. IMPRESSION: 1. Bone marrow edema of the entire second metatarsal and proximal 2/3 of the proximal phalanx of the second digit consistent with osteomyelitis. 2. Joint effusion at the second metatarsophalangeal joint suggesting septic arthritis with associated osseous erosion and subluxation of the metatarsal head. 3. Marrow edema of the third metatarsal head, likely reactive secondary to altered mechanics, differential however also includes osteomyelitis. 4. Deep skin wound about the plantar aspect of the second metatarsophalangeal joint and a peripherally enhancing fluid collection along the second metatarsal which measures at least 1.4 x 1.7 x 6.3 cm consistent with abscess. 5. Increased intrasubstance signal of the plantar muscles suggesting diabetic myopathy/myositis. Electronically Signed   By: Larose Hires D.O.   On: 05/29/2022 23:22   DG Foot 2 Views Left  Result Date: 05/29/2022 CLINICAL DATA:  Skin infection EXAM: LEFT FOOT - 2 VIEW; LEFT TIBIA  AND FIBULA - 2 VIEW COMPARISON:  None Available. FINDINGS: Soft tissue swelling at the ankle mortise. No acute fracture, dislocation or subluxation. Small posterior  and plantar calcaneal spurs. Osteolytic process at the second MTP joint. An infectious etiology is not excluded. Clinical correlation would be helpful. No radiopaque foreign bodies identified. IMPRESSION: Small posterior and plantar calcaneal spurs. Ankle mortise soft tissue swelling. Osteolytic process at the second metatarsophalangeal joint that could represent infection. Clinical correlation is recommended. Electronically Signed   By: Layla Maw M.D.   On: 05/29/2022 17:25   DG Tibia/Fibula Left  Result Date: 05/29/2022 CLINICAL DATA:  Skin infection EXAM: LEFT FOOT - 2 VIEW; LEFT TIBIA AND FIBULA - 2 VIEW COMPARISON:  None Available. FINDINGS: Soft tissue swelling at the ankle mortise. No acute fracture, dislocation or subluxation. Small posterior and plantar calcaneal spurs. Osteolytic process at the second MTP joint. An infectious etiology is not excluded. Clinical correlation would be helpful. No radiopaque foreign bodies identified. IMPRESSION: Small posterior and plantar calcaneal spurs. Ankle mortise soft tissue swelling. Osteolytic process at the second metatarsophalangeal joint that could represent infection. Clinical correlation is recommended. Electronically Signed   By: Layla Maw M.D.   On: 05/29/2022 17:25   US Venous Img Lower Unilateral Left  Result Date: 05/29/2022 CLINICAL DATA:  Left lower extremity swelling and redness for 3 weeks EXAM: LEFT LOWER EXTREMITY VENOUS DOPPLER ULTRASOUND TECHNIQUE: Gray-scale sonography with compression, as well as color and duplex ultrasound, were performed to evaluate the deep venous system(s) from the level of the common femoral vein through the popliteal and proximal calf veins. COMPARISON:  None Available. FINDINGS: VENOUS Normal compressibility of the common femoral, superficial  femoral, and popliteal veins, as well as the visualized calf veins. Visualized portions of profunda femoral vein and great saphenous vein unremarkable. No filling defects to suggest DVT on grayscale or color Doppler imaging. Doppler waveforms show normal direction of venous flow, normal respiratory plasticity and response to augmentation. Limited views of the contralateral common femoral vein are unremarkable. OTHER None. Limitations: none IMPRESSION: Negative for deep vein thrombosis in the left lower extremity. Electronically Signed   By: Jacob Moores M.D.   On: 05/29/2022 13:53    Labs: BNP (last 3 results) No results for input(s): "BNP" in the last 8760 hours. Basic Metabolic Panel: Recent Labs  Lab 05/29/22 1506 05/31/22 0149 06/01/22 0135 06/02/22 0158  NA 135 136 134* 138  K 4.0 3.9 4.3 3.6  CL 101 104 103 105  CO2 27 23 25 25   GLUCOSE 170* 148* 208* 172*  BUN 19 13 20 19   CREATININE 0.76 0.74 0.90 0.85  CALCIUM 9.2 8.8* 8.8* 8.9  MG  --   --  2.0  --    Liver Function Tests: Recent Labs  Lab 05/29/22 1506  AST 13*  ALT 14  ALKPHOS 75  BILITOT 0.9  PROT 7.3  ALBUMIN 3.3*   No results for input(s): "LIPASE", "AMYLASE" in the last 168 hours. No results for input(s): "AMMONIA" in the last 168 hours. CBC: Recent Labs  Lab 05/29/22 1506 05/31/22 0149 06/01/22 0135  WBC 10.6* 9.3 10.4  NEUTROABS 6.3  --   --   HGB 13.4 12.4* 11.8*  HCT 40.8 38.4* 36.4*  MCV 87.4 86.9 87.1  PLT 382 336 316   Cardiac Enzymes: No results for input(s): "CKTOTAL", "CKMB", "CKMBINDEX", "TROPONINI" in the last 168 hours. BNP: Invalid input(s): "POCBNP" CBG: Recent Labs  Lab 06/01/22 1602 06/01/22 2129 06/02/22 0727 06/02/22 1115 06/02/22 1610  GLUCAP 186* 158* 196* 208* 217*   D-Dimer No results for input(s): "DDIMER" in the last 72 hours. Hgb A1c No  results for input(s): "HGBA1C" in the last 72 hours. Lipid Profile No results for input(s): "CHOL", "HDL", "LDLCALC",  "TRIG", "CHOLHDL", "LDLDIRECT" in the last 72 hours. Thyroid function studies No results for input(s): "TSH", "T4TOTAL", "T3FREE", "THYROIDAB" in the last 72 hours.  Invalid input(s): "FREET3" Anemia work up No results for input(s): "VITAMINB12", "FOLATE", "FERRITIN", "TIBC", "IRON", "RETICCTPCT" in the last 72 hours. Urinalysis    Component Value Date/Time   COLORURINE YELLOW 12/28/2010 0813   APPEARANCEUR CLEAR 12/28/2010 0813   LABSPEC 1.025 12/28/2010 0813   PHURINE 5.0 12/28/2010 0813   GLUCOSEU >1000 (A) 12/28/2010 0813   HGBUR TRACE (A) 12/28/2010 0813   BILIRUBINUR NEGATIVE 12/28/2010 0813   KETONESUR NEGATIVE 12/28/2010 0813   PROTEINUR 100 (A) 12/28/2010 0813   UROBILINOGEN 0.2 12/28/2010 0813   NITRITE NEGATIVE 12/28/2010 0813   LEUKOCYTESUR NEGATIVE 12/28/2010 0813   Sepsis Labs Recent Labs  Lab 05/29/22 1506 05/31/22 0149 06/01/22 0135  WBC 10.6* 9.3 10.4   Microbiology Recent Results (from the past 240 hour(s))  Culture, blood (Routine X 2) w Reflex to ID Panel     Status: None (Preliminary result)   Collection Time: 05/30/22  7:45 AM   Specimen: Left Antecubital; Blood  Result Value Ref Range Status   Specimen Description   Final    LEFT ANTECUBITAL BOTTLES DRAWN AEROBIC AND ANAEROBIC   Special Requests   Final    Blood Culture results may not be optimal due to an excessive volume of blood received in culture bottles   Culture   Final    NO GROWTH 3 DAYS Performed at [facility], [address], [place], [place] [address]    Report Status PENDING  Incomplete  Culture, blood (Routine X 2) w Reflex to ID Panel     Status: None (Preliminary result)   Collection Time: 05/30/22  7:45 AM   Specimen: Right Antecubital; Blood  Result Value Ref Range Status   Specimen Description   Final    RIGHT ANTECUBITAL BOTTLES DRAWN AEROBIC AND ANAEROBIC   Special Requests   Final    Blood Culture results may not be optimal due to an excessive volume of blood received  in culture bottles   Culture   Final    NO GROWTH 3 DAYS Performed at [facility], [address], [place], [place] [address]    Report Status PENDING  Incomplete  Aerobic/Anaerobic Culture w Gram Stain (surgical/deep wound)     Status: None (Preliminary result)   Collection Time: 05/31/22  8:40 AM   Specimen: Soft Tissue, Other  Result Value Ref Range Status   Specimen Description ABSCESS  Final   Special Requests NONE  Final   Gram Stain   Final    FEW WBC PRESENT, PREDOMINANTLY PMN NO ORGANISMS SEEN Performed at [facility], [address], [place], [place] [address]    Culture   Final    CULTURE REINCUBATED FOR BETTER GROWTH NO ANAEROBES ISOLATED; CULTURE IN PROGRESS FOR 5 DAYS    Report Status PENDING  Incomplete  MRSA Next Gen by PCR, Nasal     Status: None   Collection Time: 05/31/22  4:51 PM   Specimen: Nasal Mucosa; Nasal Swab  Result Value Ref Range Status   MRSA by PCR Next Gen NOT DETECTED NOT DETECTED Final    Comment: (NOTE) The GeneXpert MRSA Assay (FDA approved for NASAL specimens only), is one component of a comprehensive MRSA colonization surveillance program. It is not intended to diagnose MRSA  infection nor to guide or monitor treatment for MRSA infections. Test performance is not FDA approved in patients less than 19 years old. Performed at [facility], [address], [place], [place] [address]    Time coordinating discharge: 25 minutes  SIGNED: Lanae Boast, MD  [facility] 06/02/2022, 5:16 PM  If 7PM-7AM, please contact night-coverage [email or web address]

## 2022-06-02 NOTE — Progress Notes (Signed)
Physical Therapy Treatment Patient Details Name: Grant Yang MRN: [id number] DOB: October 12, 1955 Today's Date: 06/02/2022   History of Present Illness 67 y.o. 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 with past medical history significant for diabetes mellitus type 2, prostate cancer in remission, GERD, neuropathy    PT Comments    Patient progressing well towards PT goals. Session focused on progressive ambulation with RW and functional mobility. Pt able to donn/doff post op shoe without difficulty. Reports nerve block has worn off so no foot drop noted today. Pt prefers RW for improved balance and safety. Comfortable negotiating stairs with RW per report. Some cues needed for TDWB through heel only with mobility as pt reports difficulty feeling heel with post op shoe donned. Eager to return home. Will have support. Safe to d/c from a mobility stand point. Will follow if still in the hospital.    Recommendations for follow up therapy are one component of a multi-disciplinary discharge planning process, led by the attending physician.  Recommendations may be updated based on patient status, additional functional criteria and insurance authorization.  Follow Up Recommendations       Assistance Recommended at Discharge PRN  Patient can return home with the following Assistance with cooking/housework;Assist for transportation;Help with stairs or ramp for entrance   Equipment Recommendations  None recommended by PT    Recommendations for Other Services       Precautions / Restrictions Precautions Precautions: Fall Required Braces or Orthoses: Other Brace Other Brace: post op shoe Restrictions Weight Bearing Restrictions: Yes LLE Weight Bearing: Touchdown weight bearing Other Position/Activity Restrictions: through heel only     Mobility  Bed Mobility Overal bed mobility: Modified Independent             General bed mobility comments: No  assist needed.    Transfers Overall transfer level: Needs assistance Equipment used: Rolling walker (2 wheels) Transfers: Sit to/from Stand Sit to Stand: Modified independent (Device/Increase time)           General transfer comment: Stood from EOB without difficulty, maintaining heel TD WB for the most part.    Ambulation/Gait Ambulation/Gait assistance: Supervision Gait Distance (Feet): 200 Feet Assistive device: Rolling walker (2 wheels) Gait Pattern/deviations: Step-through pattern, Decreased stance time - left, Decreased step length - right Gait velocity: decr Gait velocity interpretation: 1.31 - 2.62 ft/sec, indicative of limited community ambulator   General Gait Details: Mostly steady gait with use of RW, no foot drop noted today but cues to adhere to heel TD WB only, difficult to feel heel with post op shoe donned per report. No stumbing today.   Stairs             Wheelchair Mobility    Modified Rankin (Stroke Patients Only)       Balance Overall balance assessment: Needs assistance Sitting-balance support: Feet supported, No upper extremity supported Sitting balance-Leahy Scale: Good Sitting balance - Comments: Able to reach outside BoS and grab post opshoe and donn it independently   Standing balance support: During functional activity Standing balance-Leahy Scale: Fair Standing balance comment: Able to stand without UE support but needs UE support to maintain TDWB                            Cognition Arousal/Alertness: Awake/alert Behavior During Therapy: WFL for tasks assessed/performed Overall Cognitive Status: Within Functional Limits for tasks assessed  Exercises      General Comments        Pertinent Vitals/Pain Pain Assessment Pain Assessment: No/denies pain    Home Living                          Prior Function            PT Goals (current  goals can now be found in the care plan section) Progress towards PT goals: Progressing toward goals    Frequency    Min 5X/week      PT Plan Current plan remains appropriate    Co-evaluation              AM-PAC PT "6 Clicks" Mobility   Outcome Measure  Help needed turning from your back to your side while in a flat bed without using bedrails?: None Help needed moving from lying on your back to sitting on the side of a flat bed without using bedrails?: None Help needed moving to and from a bed to a chair (including a wheelchair)?: A Little Help needed standing up from a chair using your arms (e.g., wheelchair or bedside chair)?: A Little Help needed to walk in hospital room?: A Little Help needed climbing 3-5 steps with a railing? : A Little 6 Click Score: 20    End of Session Equipment Utilized During Treatment: Gait belt Activity Tolerance: Patient tolerated treatment well Patient left: in bed;with call bell/phone within reach Nurse Communication: Mobility status PT Visit Diagnosis: Unsteadiness on feet (R26.81);Other abnormalities of gait and mobility (R26.89);Difficulty in walking, not elsewhere classified (R26.2)     Time: 7902-4097 PT Time Calculation (min) (ACUTE ONLY): 17 min  Charges:  $Gait Training: 8-22 mins                     Vale Haven, PT, DPT Acute Rehabilitation Services Secure chat preferred Office [phone]      Blake Divine A Lanier Ensign 06/02/2022, 2:34 PM

## 2022-06-02 NOTE — Progress Notes (Signed)
Penicillin Allergy Clarification: Oral Amoxicillin Challenge  History of allergy:  Low Risk - childhood allergy, has no recollection of it, was told to him by his mother.   Type of intervention:  Amoxicillin Oral Challenge  Impact on therapy: TBD - will monitor tolerance of test dose  Plan - Oral Amoxicillin 500 mg po  x 1 - Vital checks q15 minutes for 1 hour after the dose - Prn benadryl, epi-pen available if needed - If tolerates - plan to move forward with discharge on Augmentin + Doxycycline  Thank you for allowing pharmacy to be a part of this patient's care.  Georgina Pillion, PharmD, BCPS Infectious Diseases Clinical Pharmacist 06/02/2022 3:20 PM   **Pharmacist phone directory can now be found on [email or web address] ([email or web address]).  Listed under [facility].

## 2022-06-02 NOTE — Progress Notes (Addendum)
[facility] Infectious Diseases Follow Up Note  Patient Identification: Patient Name: Grant Yang MRN: [id number] Admit Date: 05/29/2022  1:22 PM Age: 67 y.o.Today's Date: 06/02/2022  Reason for Visit: osteomyelitis   Principal Problem:   Acute osteomyelitis of left foot Active Problems:   Hypertension   Uncontrolled type 2 diabetes mellitus with hyperglycemia   Osteomyelitis of foot, left, acute  Antibiotics:  Vancomycin 4/8-c Cefepime 4/9-4/11, ceftriaxone 4/11-c Metronidazole 4/9-c   Lines/Hardware: no known   Interval Events:afebrile in the last 24 hrs, OR cx still incubating   Assessment 67 year old male with PMH as below including 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 OR cx incubating  4/12 OR cx still incubating, patient is eager to go home. D/w Dr Lajoyce Corners, no concerns for remaining bone infection and OK with PO abtx   Recommendations OK to switch to PO doxycycline and augmentin on discharge. Provide him a month supply on discharge.  Post op care per Orthopedics  Fu in ID clinic on 5/7 at 10 am with myself Will fu OR cx to completion peripherally, otherwise ID will so. Recall if needed.  D/w ID pharm D  Addendum: H/o Childhood allergy that he doesn't have any recollection of but was told to him by his mom who has since passed. Plan for PO amoxicillin challenge with ID pharm D and if tolerates OK, can switch to PO augmentin and doxycyline for a month.   Rest of the management as per the primary team. Thank you for the consult. Please page with pertinent questions or concerns.  ______________________________________________________________________ Subjective patient seen and examined at the bedside.  No complaints and eager to go home   Vitals BP (!) 140/80 (BP Location: Left Arm)   Pulse 63   Temp 97.9 F (36.6 C) (Oral)   Resp 16   Ht 5\' 11"  (1.803 m)   Wt 94.3 kg   SpO2 99%   BMI 29.01 kg/m     Physical Exam Constitutional: Adult male lying in the bed and appears comfortable    Comments:   Cardiovascular:     Rate and Rhythm: Normal rate and regular rhythm.     Heart sounds:   Pulmonary:     Effort: Pulmonary effort is normal on room air    Comments:   Abdominal:     Palpations: Abdomen is soft.     Tenderness: Nondistended and nontender  Musculoskeletal:        General: No swelling or tendernes in peripheral joints, left foot is wrapped in a dressing C/D/I  Skin:    Comments: No rashes   Neurological:     General: awake, alert and oriented, grossly non focal , following commands   Psychiatric:        Mood and Affect: Mood normal.   Pertinent Microbiology Results for orders placed or performed during the hospital encounter of 05/29/22  Culture, blood (Routine X 2) w Reflex to ID Panel     Status: None (Preliminary result)   Collection Time: 05/30/22  7:45 AM   Specimen: Left Antecubital; Blood  Result Value Ref Range Status   Specimen Description   Final    LEFT ANTECUBITAL BOTTLES DRAWN AEROBIC AND ANAEROBIC   Special Requests   Final    Blood Culture results may  not be optimal due to an excessive volume of blood received in culture bottles   Culture   Final    NO GROWTH 3 DAYS Performed at [facility], [address], [place], [place] [address]    Report Status PENDING  Incomplete  Culture, blood (Routine X 2) w Reflex to ID Panel     Status: None (Preliminary result)   Collection Time: 05/30/22  7:45 AM   Specimen: Right Antecubital; Blood  Result Value Ref Range Status   Specimen Description   Final    RIGHT ANTECUBITAL BOTTLES DRAWN AEROBIC AND ANAEROBIC   Special Requests   Final    Blood Culture results may not be optimal due to an  excessive volume of blood received in culture bottles   Culture   Final    NO GROWTH 3 DAYS Performed at [facility], [address] [place], [place] [address]    Report Status PENDING  Incomplete  Aerobic/Anaerobic Culture w Gram Stain (surgical/deep wound)     Status: None (Preliminary result)   Collection Time: 05/31/22  8:40 AM   Specimen: Soft Tissue, Other  Result Value Ref Range Status   Specimen Description ABSCESS  Final   Special Requests NONE  Final   Gram Stain   Final    FEW WBC PRESENT, PREDOMINANTLY PMN NO ORGANISMS SEEN   Culture   Final    CULTURE REINCUBATED FOR BETTER GROWTH Performed at [facility], [address] [place], [place] [address]    Report Status PENDING  Incomplete  MRSA Next Gen by PCR, Nasal     Status: None   Collection Time: 05/31/22  4:51 PM   Specimen: Nasal Mucosa; Nasal Swab  Result Value Ref Range Status   MRSA by PCR Next Gen NOT DETECTED NOT DETECTED Final    Comment: (NOTE) The GeneXpert MRSA Assay (FDA approved for NASAL specimens only), is one component of a comprehensive MRSA colonization surveillance program. It is not intended to diagnose MRSA infection nor to guide or monitor treatment for MRSA infections. Test performance is not FDA approved in patients less than 51 years old. Performed at [facility], [address], [place], [place] [address]     Pertinent Lab.    Latest Ref Rng & Units 06/01/2022    1:35 AM 05/31/2022    1:49 AM 05/29/2022    3:06 PM  CBC  WBC 4.0 - 10.5 K/uL 10.4  9.3  10.6   Hemoglobin 13.0 - 17.0 g/dL 52.8  41.3  24.4   Hematocrit 39.0 - 52.0 % 36.4  38.4  40.8   Platelets 150 - 400 K/uL 316  336  382       Latest Ref Rng & Units 06/02/2022    1:58 AM 06/01/2022    1:35 AM 05/31/2022    1:49 AM  CMP  Glucose 70 - 99 mg/dL 010  272  536   BUN 8 - 23 mg/dL Creatinine 0.61 - 1.24 mg/dL 6.44  0.34  7.42   Sodium 135 - 145 mmol/L 138  134  136   Potassium 3.5 - 5.1  mmol/L 3.6  4.3  3.9   Chloride 98 - 111 mmol/L 105  103  104   CO2 22 - 32 mmol/L Calcium 8.9 - 10.3 mg/dL 8.9  8.8  8.8      Pertinent Imaging today Plain films and CT  images have been personally visualized and interpreted; radiology reports have been reviewed. Decision making incorporated into the Impression / Recommendations.  No results found.   I spent 55  minutes for this patient encounter including review of prior medical records, coordination of care with primary/other specialist with greater than 50% of time being face to face/counseling and discussing diagnostics/treatment plan with the patient/family.  Electronically signed by:   Odette Fraction, MD Infectious Disease Physician [facility] for Infectious Disease Pager: [phone]

## 2022-06-04 LAB — CULTURE, BLOOD (ROUTINE X 2)

## 2022-06-04 LAB — AEROBIC/ANAEROBIC CULTURE W GRAM STAIN (SURGICAL/DEEP WOUND)

## 2022-06-05 ENCOUNTER — Encounter: Payer: Self-pay | Admitting: Internal Medicine

## 2022-06-05 ENCOUNTER — Ambulatory Visit: Payer: 59 | Admitting: Internal Medicine

## 2022-06-05 VITALS — BP 138/80 | HR 82 | Resp 16 | Ht 71.0 in | Wt 204.0 lb

## 2022-06-05 DIAGNOSIS — E1165 Type 2 diabetes mellitus with hyperglycemia: Secondary | ICD-10-CM | POA: Diagnosis not present

## 2022-06-05 DIAGNOSIS — Z89422 Acquired absence of other left toe(s): Secondary | ICD-10-CM | POA: Diagnosis not present

## 2022-06-05 DIAGNOSIS — I10 Essential (primary) hypertension: Secondary | ICD-10-CM

## 2022-06-05 MED ORDER — DEXCOM G7 SENSOR MISC
1.0000 [IU] | 2 refills | Status: DC
Start: 2022-06-05 — End: 2022-10-16

## 2022-06-05 NOTE — Assessment & Plan Note (Signed)
Patient taking amlodipine 10 mg daily since hospital stay. His BP is 155/88. He has just eaten bacon and fried eggs. Discussed diet, he is going to try and cook similar to food in hospital. If blood pressure elevated at follow up in 3 weeks we will start additional antihypertensive medications.

## 2022-06-05 NOTE — Assessment & Plan Note (Signed)
Patient noticed some mild swelling this morning. No sign of acute infection on exam. See pictures above. Foot bandaged and he will follow up with Dr.Duda this week. Given precautions to follow up with provider or ED quickly if he notices skin changes or increased swelling.

## 2022-06-05 NOTE — Progress Notes (Signed)
   HPI:[name] is a 67 y.o. male here for hospital follow-up .  Patient was admitted for osteomyelitis and is now status post second ray amputation on 4/10. Discharged on p.o. doxycycline and Augmentin.  He has postop care with orthopedic surgery and also going to have follow-up with ID clinic on 5/7.  He noticed some mild swelling in his left leg this morning compared to yesterday. He has not noticed any color change , increased drainage , and sutures are intact. No fever or uncontrolled pain.  . For the details of today's visit, please refer to the assessment and plan.    Physical Exam: Vitals:   06/05/22 1001 06/05/22 1036  BP: (!) 155/88 138/80  Pulse: 82   Resp: 16   SpO2: 96%   Weight: 204 lb (92.5 kg)   Height: 5\' 11"  (1.803 m)      Physical Exam Constitutional:      General: He is not in acute distress.    Appearance: He is not ill-appearing.  Cardiovascular:     Rate and Rhythm: Normal rate and regular rhythm.     Heart sounds: No murmur heard. Pulmonary:     Effort: Pulmonary effort is normal. No respiratory distress.     Breath sounds: No wheezing.  Musculoskeletal:        General: Deformity (left second ray amputated) present.     Right lower leg: No edema.     Left lower leg: No edema.  Skin:    Comments: Wound intact , without erythema. Gauze had some serosanguinous drainage, but not drainage coming from wound today,         Assessment & Plan:   Grant Yang was seen today for follow-up.  Uncontrolled type 2 diabetes mellitus with hyperglycemia Assessment & Plan: Hemoglobin A1c 14.3.  Patient is currently taking insulin 70/30 10 units twice daily.  We started metformin 1 week ago and he is ramping this medication up over the month. The patient will need to test their blood sugar 2 times a day and they are injecting insulin 2 times a day.   Start continuous blood glucose monitoring, orders placed Follow up in 3 weeks. Continue Insulin 70/30 10  units BID for now, he has this Insulin at home.  Counseled on diet Continue Metformin ramp up    Orders: -     Dexcom G7 Sensor; 1 Units by Does not apply route See admin instructions. Change sensor after 10 days  Dispense: 3 each; Refill: 2  Primary hypertension Assessment & Plan: Patient taking amlodipine 10 mg daily since hospital stay. His BP is 155/88. He has just eaten bacon and fried eggs. Discussed diet, he is going to try and cook similar to food in hospital. If blood pressure elevated at follow up in 3 weeks we will start additional antihypertensive medications.    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 No sign of acute infection on exam. See pictures above. Foot bandaged and he will follow up with Dr.Duda this week. Given precautions to follow up with provider or ED quickly if he notices skin changes or increased swelling.        Milus Banister, MD

## 2022-06-05 NOTE — Patient Instructions (Signed)
Thank you, Mr.Shigeo L Meche for allowing [place] to provide your care today.   Follow up in 3 weeks. I have sent Dexcom G7 sensors to your pharmacy to be paired with your Iphone. Talk to pharmacist  if you have questions on how to use. Bring readings to next visit. Continue to increase Metformin by 500 mg weekly.  If your blood pressure is elevated at your next visit we will start an additional medications.   Reminders: Call or go to Emergency Room  if your foot becomes swollen or red.     Thurmon Fair, M.D.

## 2022-06-05 NOTE — Assessment & Plan Note (Addendum)
Hemoglobin A1c 14.3.  Patient is currently taking insulin 70/30 10 units twice daily.  We started metformin 1 week ago and he is ramping this medication up over the month. The patient will need to test their blood sugar 2 times a day and they are injecting insulin 2 times a day.   Start continuous blood glucose monitoring, orders placed Follow up in 3 weeks. Continue Insulin 70/30 10 units BID for now, he has this Insulin at home.  Counseled on diet Continue Metformin ramp up

## 2022-06-06 LAB — SUSCEPTIBILITY RESULT

## 2022-06-09 ENCOUNTER — Encounter: Payer: Self-pay | Admitting: Infectious Diseases

## 2022-06-09 LAB — SUSCEPTIBILITY, AER + ANAEROB

## 2022-06-09 LAB — SUSCEPTIBILITY RESULT

## 2022-06-09 LAB — BACTERIAL ORGANISM REFLEX

## 2022-06-09 NOTE — Progress Notes (Signed)
ID brief npte Final cx reports from 05/31/22  Suscept Result 1 Comment Abnormal   Comment: (NOTE) Staphylococcus hominis Most isolates of Staphylococcus sp. produce a beta-lactamase enzyme rendering them resistant to penicillin. Please contact the laboratory if penicillin is being considered for therapy.  Antimicrobial Suscept Comment VC  Comment: (NOTE)      ** S = Susceptible; I = Intermediate; R = Resistant **                   P = Positive; N = Negative            MICS are expressed in micrograms per mL   Antibiotic                 RSLT#1    RSLT#2    RSLT#3    RSLT#4 Ciprofloxacin                  S Clindamycin                    S Erythromycin                   S Gentamicin                     S Levofloxacin                   S Oxacillin                      S Rifampin                       S Tetracycline                   S Trimethoprim/Sulfa             S Vancomycin                     S

## 2022-06-13 ENCOUNTER — Encounter: Payer: Self-pay | Admitting: Family

## 2022-06-13 ENCOUNTER — Ambulatory Visit (INDEPENDENT_AMBULATORY_CARE_PROVIDER_SITE_OTHER): Payer: 59 | Admitting: Family

## 2022-06-13 DIAGNOSIS — L02612 Cutaneous abscess of left foot: Secondary | ICD-10-CM

## 2022-06-13 NOTE — Progress Notes (Signed)
Post-Op Visit Note   Patient: Grant Yang           Date of Birth: 11/17/1955           MRN: [id number] Visit Date: 06/13/2022 PCP: Billie Lade, MD  Chief Complaint:  Chief Complaint  Patient presents with   Left Foot - Routine Post Op    05/31/22 left foot debridement of abscess and 2nd toe amputation    HPI:  HPI The patient is a 67 year old gentleman seen status post left foot debridement of abscess with amputation of the second toe.  He has been full weightbearing in regular shoewear.  Doing dry dressing changes Ortho Exam On examination of the left foot the incision is well-approximated sutures there is no erythema no drainage  Visit Diagnoses: No diagnosis found.  Plan: Begin daily Dial soap cleansing.  May shower may get this wet.  Do not submerge she will follow-up in 1 week for suture removal  Follow-Up Instructions: No follow-ups on file.   Imaging: No results found.  Orders:  No orders of the defined types were placed in this encounter.  No orders of the defined types were placed in this encounter.    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 05/30/2022   Cellulitis of left leg 05/29/2022   COPD suggested by initial evaluation 05/29/2022   Prostate cancer 01/30/2017   Cellulitis of left hand 02/01/17 I and D 01/30/2017   Hypertension 01/30/2017   Uncontrolled type 2 diabetes mellitus with hyperglycemia 01/30/2017   Past Medical History:  Diagnosis Date   Cancer    prostate cancer    Diabetes mellitus    GERD (gastroesophageal reflux disease)    History of kidney stones    Hypertension    Neuropathy in diabetes    bilateral lower extremities    Shortness of breath    patient states it hurts to breath; this was when he use to smoke cigarettes    Stroke 2010   "mini stroke" patient cant remember when;  states it happened in w[place] in 2010 . denies any residual deficits     Family History  Problem Relation Age of Onset   Heart failure Mother    Cancer Father     Past Surgical History:  Procedure Laterality Date   AMPUTATION Left 05/31/2022   Procedure: LEFT 2ND TOE AMPUTATION;  Surgeon: Nadara Mustard, MD;  Location: [facility] OR;  Service: Orthopedics;  Laterality: Left;   APPENDECTOMY     CHOLECYSTECTOMY  12/30/2010   Procedure: LAPAROSCOPIC CHOLECYSTECTOMY;  Surgeon: Dalia Heading;  Location: [facility] OR[facility];  Service: General;  Laterality: N/A;   I & D EXTREMITY Left 05/31/2022   Procedure: LEFT FOOT DEBRIDEMENT;  Surgeon: Nadara Mustard, MD;  Location: [facility] OR;  Service: Orthopedics;  Laterality: Left;   INCISION AND DRAINAGE ABSCESS Left 02/01/2017   Procedure: INCISION AND DRAINAGE ABSCESS LEFT HAND;  Surgeon: Vickki Hearing, MD;  Location: [facility];  Service: Orthopedics;  Laterality: Left;   LYMPHADENECTOMY Bilateral 08/02/2017   Procedure: LYMPHADENECTOMY, PELVIC;  Surgeon: Heloise Purpura, MD;  Location: [facility] ORS;  Service: Urology;  Laterality: Bilateral;   ROBOT ASSISTED LAPAROSCOPIC RADICAL PROSTATECTOMY N/A 08/02/2017   Procedure: XI ROBOTIC ASSISTED LAPAROSCOPIC RADICAL PROSTATECTOMY LEVEL 2;  Surgeon: Heloise Purpura, MD;  Location: [facility] ORS;  Service: Urology;  Laterality: N/A;   Social History   Occupational History   Not on file  Tobacco Use   Smoking status: Former    Packs/day: 2.00    Years: 38.00    Additional pack years: 0.00    Total pack years: 76.00    Types: Cigarettes    Start date: 60    Quit date: 2010    Years since quitting: 14.3   Smokeless tobacco: Never  Substance and Sexual Activity   Alcohol use: No   Drug use: Yes    Types: Marijuana    Comment: occ   Sexual activity: Yes

## 2022-06-21 ENCOUNTER — Ambulatory Visit (INDEPENDENT_AMBULATORY_CARE_PROVIDER_SITE_OTHER): Payer: 59 | Admitting: Family

## 2022-06-21 ENCOUNTER — Encounter: Payer: Self-pay | Admitting: Family

## 2022-06-21 DIAGNOSIS — L02612 Cutaneous abscess of left foot: Secondary | ICD-10-CM

## 2022-06-21 NOTE — Progress Notes (Signed)
Post-Op Visit Note   Patient: Grant Yang           Date of Birth: 11-29-1955           MRN: [id number] Visit Date: 06/21/2022 PCP: Billie Lade, MD  Chief Complaint:  Chief Complaint  Patient presents with   Left Foot - Routine Post Op    HPI:  HPI The patient is a 67 year old gentleman seen status post left foot debridement with second toe amputation.  He has been full weightbearing in regular shoewear.  Doing daily Dial soap cleansing and dry dressing changes Ortho Exam On examination of the left foot his incision is healing well sutures harvested today there is no gaping drainage or erythema  Visit Diagnoses: No diagnosis found.  Plan: May advance his weightbearing in regular shoewear.  He will follow-up in the office in 3 weeks.  Follow-Up Instructions: No follow-ups on file.   Imaging: No results found.  Orders:  No orders of the defined types were placed in this encounter.  No orders of the defined types were placed in this encounter.    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) 05/30/2022   Cellulitis of left leg 05/29/2022   COPD suggested by initial evaluation (HCC) 05/29/2022   Prostate cancer (HCC) 01/30/2017   Cellulitis of left hand 02/01/17 I and D 01/30/2017   Hypertension 01/30/2017   Uncontrolled type 2 diabetes mellitus with hyperglycemia (HCC) 01/30/2017   Past Medical History:  Diagnosis Date   Cancer (HCC)    prostate cancer    Diabetes mellitus    GERD (gastroesophageal reflux disease)    History of kidney stones    Hypertension    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  [place] in 2010 . denies any residual deficits     Family History  Problem Relation Age of Onset   Heart failure Mother    Cancer Father     Past Surgical History:  Procedure Laterality Date   AMPUTATION Left 05/31/2022   Procedure: LEFT 2ND TOE AMPUTATION;  Surgeon: Nadara Mustard, MD;  Location: [facility] OR;  Service: Orthopedics;  Laterality: Left;   APPENDECTOMY     CHOLECYSTECTOMY  12/30/2010   Procedure: LAPAROSCOPIC CHOLECYSTECTOMY;  Surgeon: Dalia Heading;  Location: [facility] ORS;  Service: General;  Laterality: N/A;   I & D EXTREMITY Left 05/31/2022   Procedure: LEFT FOOT DEBRIDEMENT;  Surgeon: Nadara Mustard, MD;  Location: [facility] OR;  Service: Orthopedics;  Laterality: Left;   INCISION AND DRAINAGE ABSCESS Left 02/01/2017   Procedure: INCISION AND DRAINAGE ABSCESS LEFT HAND;  Surgeon: Vickki Hearing, MD;  Location: [facility] ORS;  Service: Orthopedics;  Laterality: Left;   LYMPHADENECTOMY Bilateral 08/02/2017   Procedure: LYMPHADENECTOMY, PELVIC;  Surgeon: Heloise Purpura, MD;  Location: [facility] ORS;  Service: Urology;  Laterality: Bilateral;   ROBOT ASSISTED LAPAROSCOPIC RADICAL PROSTATECTOMY N/A 08/02/2017   Procedure: XI ROBOTIC ASSISTED LAPAROSCOPIC RADICAL PROSTATECTOMY LEVEL 2;  Surgeon: Heloise Purpura, MD;  Location: [facility] ORS;  Service: Urology;  Laterality: N/A;   Social History  Occupational History   Not on file  Tobacco Use   Smoking status: Former    Packs/day: 2.00    Years: 38.00    Additional pack years: 0.00    Total pack years: 76.00    Types: Cigarettes    Start date: 44    Quit date: 2010    Years since quitting: 14.3   Smokeless tobacco: Never  Substance and Sexual Activity   Alcohol use: No   Drug use: Yes    Types: Marijuana    Comment: occ   Sexual activity: Yes

## 2022-06-27 ENCOUNTER — Encounter: Payer: Self-pay | Admitting: Infectious Diseases

## 2022-06-27 ENCOUNTER — Ambulatory Visit (INDEPENDENT_AMBULATORY_CARE_PROVIDER_SITE_OTHER): Payer: 59 | Admitting: Infectious Diseases

## 2022-06-27 ENCOUNTER — Other Ambulatory Visit: Payer: Self-pay

## 2022-06-27 ENCOUNTER — Inpatient Hospital Stay: Payer: 59 | Admitting: Infectious Diseases

## 2022-06-27 VITALS — BP 149/78 | HR 74 | Resp 15 | Ht 71.0 in | Wt 212.0 lb

## 2022-06-27 DIAGNOSIS — Z79899 Other long term (current) drug therapy: Secondary | ICD-10-CM | POA: Insufficient documentation

## 2022-06-27 DIAGNOSIS — M86172 Other acute osteomyelitis, left ankle and foot: Secondary | ICD-10-CM

## 2022-06-27 NOTE — Progress Notes (Signed)
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   AMLODIPINE (NORVASC) 10 MG TABLET    Take 1 tablet (10 mg total) by mouth daily.   AMOXICILLIN-CLAVULANATE (AUGMENTIN) 875-125 MG TABLET    Take 1 tablet by mouth 2 (two) times daily for 28 days.   BLOOD GLUCOSE METER KIT AND SUPPLIES    Dispense based on patient and insurance preference. Use up to four times daily as directed. (FOR ICD-10 E10.9, E11.9).   CONTINUOUS BLOOD GLUC SENSOR (DEXCOM G7 SENSOR) MISC    1 Units by Does not apply route See admin instructions. Change sensor after 10 days   DOXYCYCLINE (ADOXA) 100 MG TABLET    Take 1 tablet (100 mg total) by mouth 2 (two) times daily for 28 days.   EPINEPHRINE 0.3 MG/0.3 ML IJ SOAJ INJECTION    Inject 0.3 mg into the muscle once as needed for up to 1 dose (if patient exhibits significant signs and symptoms of allergic reaction.).   GABAPENTIN (NEURONTIN) 300 MG CAPSULE    Take 1 capsule (300 mg total) by mouth 3 (three) times daily as needed.   INSULIN NPH-REGULAR HUMAN (NOVOLIN 70/30) (70-30) 100 UNIT/ML INJECTION    Inject 10 Units into the skin 2 (two) times daily with a meal.   INSULIN SYRINGES, DISPOSABLE, U-100 0.3 ML MISC    10 Units by Does not apply route 2 (two) times daily with breakfast and lunch.   METFORMIN  (GLUCOPHAGE-XR) 500 MG 24 HR TABLET    Take 500 mg by mouth once daily x1 week, then increase to 500 mg twice daily x1 week, then 1,000 mg in the AM and 500 mg in the PM x1 week, and finally 1,000 mg twice daily. Take with food.  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 OR cx staph hominis. Patient was discharged on PO doxycycline and augmentin on 4/12 to be taken for a month.  06/27/22 Taking doxycycline and augmentin as instructed. Denies missing doses. Denies nausea, vomiting and diarrhea. Last seen by Ortho NP 5/1 when wound was thought to be healing well with no concerns for infection. Denies any pain, tenderness, swelling or drainage at the surgical site. Has fu wuth Dr Lajoyce Corners on 5/16  Review of Systems: all systems reviewed with pertinent positives and negatives as listed above  Past Medical History:  Diagnosis Date   Cancer [facility])    prostate cancer    Diabetes mellitus    GERD (  gastroesophageal reflux disease)    History of kidney stones    Hypertension    Neuropathy in diabetes [facility])    bilateral lower extremities    Shortness of breath    patient states it hurts to breath; this was when he use to smoke cigarettes    Stroke [facility]) 2010   "mini stroke" patient cant remember when; states it happened in [place] in 2010 . denies any residual deficits    Past Surgical History:  Procedure Laterality Date   AMPUTATION Left 05/31/2022   Procedure: LEFT 2ND TOE AMPUTATION;  Surgeon: Nadara Mustard, MD;  Location: [facility] OR;  Service: Orthopedics;  Laterality: Left;   APPENDECTOMY     CHOLECYSTECTOMY  12/30/2010   Procedure: LAPAROSCOPIC CHOLECYSTECTOMY;  Surgeon: Dalia Heading;  Location: [facility];  Service: General;  Laterality: N/A;   I & D EXTREMITY Left 05/31/2022   Procedure: LEFT FOOT DEBRIDEMENT;  Surgeon: Nadara Mustard, MD;  Location: [facility] OR;  Service: Orthopedics;  Laterality: Left;   INCISION AND DRAINAGE ABSCESS Left 02/01/2017   Procedure: INCISION AND DRAINAGE ABSCESS LEFT HAND;  Surgeon: Vickki Hearing, MD;  Location: [facility];  Service: Orthopedics;  Laterality: Left;   LYMPHADENECTOMY Bilateral 08/02/2017   Procedure: LYMPHADENECTOMY, PELVIC;  Surgeon: Heloise Purpura, MD;  Location: [facility];  Service: Urology;  Laterality: Bilateral;   ROBOT ASSISTED LAPAROSCOPIC RADICAL PROSTATECTOMY N/A 08/02/2017   Procedure: XI ROBOTIC ASSISTED LAPAROSCOPIC RADICAL PROSTATECTOMY LEVEL 2;  Surgeon: Heloise Purpura, MD;  Location: [facility];  Service: Urology;  Laterality: N/A;     Social History   Tobacco Use   Smoking status: Former    Packs/day: 2.00    Years: 38.00    Additional pack years: 0.00    Total pack years: 76.00    Types: Cigarettes    Start date: 20    Quit date: 2010    Years since quitting: 14.3   Smokeless tobacco: Never  Substance Use Topics   Alcohol use: No   Drug use: Yes    Types: Marijuana    Comment: occ    Family History  Problem Relation Age of Onset   Heart failure Mother    Cancer Father     Allergies  Allergen Reactions   Penicillins Other (See Comments)    Childhood allergy. Has patient had a PCN reaction causing immediate rash, facial/tongue/throat swelling, SOB or lightheadedness with hypotension: Unknown Has patient had a PCN reaction causing severe rash involving mucus membranes or skin necrosis: Unknown Has patient had a PCN reaction that required hospitalization: Unknown Has patient had a PCN reaction occurring within the last 10 years: No If all of the above answers are "NO", then may proceed with Cephalosporin use.  Other reaction(s): unknown    Health Maintenance  Topic Date Due   COVID-19  Vaccine (1) Never done   FOOT EXAM  Never done   OPHTHALMOLOGY EXAM  Never done   Diabetic kidney evaluation - Urine ACR  Never done   Hepatitis C Screening  Never done   Zoster Vaccines- Shingrix (1 of 2) Never done   COLONOSCOPY (Pts 45-32yrs Insurance coverage will need to be confirmed)  Never done   Lung Cancer Screening  Never done   INFLUENZA VACCINE  09/21/2022   HEMOGLOBIN A1C  11/28/2022   Diabetic kidney evaluation - eGFR measurement  06/02/2023   DTaP/Tdap/Td (3 - Td or Tdap) 08/31/2031   HIV Screening  Completed   HPV VACCINES  Aged Out    Objective: BP (!) 149/78   Pulse 74   Resp 15   Ht 5\' 11"  (1.803 m)   Wt 212 lb (96.2 kg)   SpO2 99%   BMI 29.57 kg/m    Physical Exam Constitutional:      Appearance: Normal appearance.  HENT:     Head: Normocephalic and atraumatic.      Mouth: Mucous membranes are moist.  Eyes:    Conjunctiva/sclera: Conjunctivae normal.     Pupils:    Cardiovascular:     Rate and Rhythm: Normal rate and regular rhythm.     Heart sounds:   Pulmonary:     Effort: Pulmonary effort is normal.     Breath sounds: Normal breath sounds.   Abdominal:     General: Non distended     Palpations: soft.   Musculoskeletal:        General: Normal range of motion. Left 2nd toe amputated site has almost healed. There is very small dark disoloration of  skin in his lateral left foot, does not appear to be cellulitis or infection   Skin:    General: Skin is warm and dry.     Comments:  Neurological:     General: grossly non focal     Mental Status: awake, alert and oriented to person, place, and time.   Psychiatric:        Mood and Affect: Mood normal.   Lab Results Lab Results  Component Value Date   WBC 10.4 06/01/2022   HGB 11.8 (L) 06/01/2022   HCT 36.4 (L) 06/01/2022   MCV 87.1 06/01/2022   PLT 316 06/01/2022    Lab Results  Component Value Date   CREATININE 0.85 06/02/2022   BUN 19 06/02/2022   NA 138 06/02/2022   K 3.6  06/02/2022   CL 105 06/02/2022   CO2 25 06/02/2022    Lab Results  Component Value Date   ALT 14 05/29/2022   AST 13 (L) 05/29/2022   ALKPHOS 75 05/29/2022   BILITOT 0.9 05/29/2022    No results found for: "CHOL", "HDL", "LDLCALC", "LDLDIRECT", "TRIG", "CHOLHDL" No results found for: "LABRPR", "RPRTITER" No results found for: "HIV1RNAQUANT", "HIV1RNAVL", "CD4TABS"  Assessment/Plan 67 year old male with PMH as below including 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 OR cx staph hominis D/w Dr Lajoyce Corners, no concerns for remaining bone infection and OK with PO abtx   Plan  Complete course of doxycycline and augmentin, EOT 5/10 Fu with Orthopedics as instructed   # Medication Management  No labs today as stopping abtx in few days   I have personally spent 42  minutes involved in face-to-face and non-face-to-face activities for this patient on the day of the visit. Professional time spent includes the following activities: Preparing to see the patient (review of tests), Obtaining and/or reviewing separately obtained history (admission/discharge record), Performing a medically appropriate examination and/or evaluation , Ordering medications/tests/procedures, referring and communicating with other health care professionals, Documenting clinical information in the EMR, Independently interpreting results (not separately reported), Communicating results to the patient/family/caregiver, Counseling and educating the patient/family/caregiver and Care coordination (not separately reported).   Victoriano Lain, MD [facility] 06/27/2022, 9:56 AM

## 2022-06-29 ENCOUNTER — Encounter: Payer: Self-pay | Admitting: Internal Medicine

## 2022-06-29 ENCOUNTER — Ambulatory Visit: Payer: 59 | Admitting: Internal Medicine

## 2022-06-29 VITALS — BP 136/82 | HR 71 | Ht 71.0 in | Wt 205.2 lb

## 2022-06-29 DIAGNOSIS — E1165 Type 2 diabetes mellitus with hyperglycemia: Secondary | ICD-10-CM | POA: Diagnosis not present

## 2022-06-29 DIAGNOSIS — I10 Essential (primary) hypertension: Secondary | ICD-10-CM

## 2022-06-29 DIAGNOSIS — Z794 Long term (current) use of insulin: Secondary | ICD-10-CM

## 2022-06-29 DIAGNOSIS — Z8673 Personal history of transient ischemic attack (TIA), and cerebral infarction without residual deficits: Secondary | ICD-10-CM

## 2022-06-29 MED ORDER — ATORVASTATIN CALCIUM 40 MG PO TABS
40.0000 mg | ORAL_TABLET | Freq: Every day | ORAL | 3 refills | Status: DC
Start: 2022-06-29 — End: 2022-09-29

## 2022-06-29 MED ORDER — ASPIRIN 81 MG PO TBEC
81.0000 mg | DELAYED_RELEASE_TABLET | Freq: Every day | ORAL | 12 refills | Status: AC
Start: 2022-06-29 — End: ?

## 2022-06-29 MED ORDER — OZEMPIC (0.25 OR 0.5 MG/DOSE) 2 MG/3ML ~~LOC~~ SOPN
0.2500 mg | PEN_INJECTOR | SUBCUTANEOUS | 0 refills | Status: DC
Start: 2022-06-29 — End: 2022-08-04

## 2022-06-29 MED ORDER — OLMESARTAN MEDOXOMIL 20 MG PO TABS
20.0000 mg | ORAL_TABLET | Freq: Every day | ORAL | 2 refills | Status: DC
Start: 2022-06-29 — End: 2022-09-19

## 2022-06-29 NOTE — Progress Notes (Signed)
Established Patient Office Visit  Subjective   Patient ID: Grant Yang, male    DOB: September 17, 1955  Age: 67 y.o. MRN: [id number]  Chief Complaint  Patient presents with   Diabetes    Follow up   Grant Yang returns to care today for follow-up of diabetes mellitus and hypertension.  He has recently established care at [facility].  Last evaluated by Dr. Barbaraann Faster on 4/15 for hospital follow-up after recent admission for osteomyelitis of the second digit of the left foot, s/p second ray amputation on 4/10.  He was discharged on doxycycline and Augmentin.  3-week follow-up arranged for further management of diabetes and HTN.  In the interim he has been seen by infectious disease and orthopedic surgery. Grant Yang reports feeling fairly well today.  He will complete antibiotics tomorrow.  His foot is healing appropriately.  Sutures have been removed.  He has no additional concerns to discuss today.  Past Medical History:  Diagnosis Date   Cancer [facility])    prostate cancer    Diabetes mellitus    GERD (gastroesophageal reflux disease)    History of kidney stones    Hypertension    Neuropathy in diabetes [facility])    bilateral lower extremities    Shortness of breath    patient states it hurts to breath; this was when he use to smoke cigarettes    Stroke [facility]) 2010   "mini stroke" patient cant remember when; states it happened in [place] in 2010 . denies any residual deficits    Past Surgical History:  Procedure Laterality Date   AMPUTATION Left 05/31/2022   Procedure: LEFT 2ND TOE AMPUTATION;  Surgeon: Nadara Mustard, MD;  Location: [facility] OR;  Service: Orthopedics;  Laterality: Left;   APPENDECTOMY     CHOLECYSTECTOMY  12/30/2010   Procedure: LAPAROSCOPIC CHOLECYSTECTOMY;  Surgeon: Dalia Heading;  Location: [facility];  Service: General;  Laterality: N/A;   I & D EXTREMITY Left 05/31/2022   Procedure: LEFT FOOT DEBRIDEMENT;  Surgeon: Nadara Mustard, MD;  Location: [facility] OR;  Service: Orthopedics;  Laterality:  Left;   INCISION AND DRAINAGE ABSCESS Left 02/01/2017   Procedure: INCISION AND DRAINAGE ABSCESS LEFT HAND;  Surgeon: Vickki Hearing, MD;  Location: [facility];  Service: Orthopedics;  Laterality: Left;   LYMPHADENECTOMY Bilateral 08/02/2017   Procedure: LYMPHADENECTOMY, PELVIC;  Surgeon: Heloise Purpura, MD;  Location: [facility];  Service: Urology;  Laterality: Bilateral;   ROBOT ASSISTED LAPAROSCOPIC RADICAL PROSTATECTOMY N/A 08/02/2017   Procedure: XI ROBOTIC ASSISTED LAPAROSCOPIC RADICAL PROSTATECTOMY LEVEL 2;  Surgeon: Heloise Purpura, MD;  Location: [facility];  Service: Urology;  Laterality: N/A;   Social History   Tobacco Use   Smoking status: Former    Packs/day: 2.00    Years: 38.00    Additional pack years: 0.00    Total pack years: 76.00    Types: Cigarettes    Start date: 69    Quit date: 2010    Years since quitting: 14.3   Smokeless tobacco: Never  Substance Use Topics   Alcohol use: No   Drug use: Yes    Types: Marijuana    Comment: occ   Family History  Problem Relation Age of Onset   Heart failure Mother    Cancer Father    Allergies  Allergen Reactions   Penicillins Other (See Comments)    Childhood allergy. Has patient had a PCN reaction causing immediate rash, facial/tongue/throat swelling, SOB or lightheadedness with hypotension: Unknown Has  patient had a PCN reaction causing severe rash involving mucus membranes or skin necrosis: Unknown Has patient had a PCN reaction that required hospitalization: Unknown Has patient had a PCN reaction occurring within the last 10 years: No If all of the above answers are "NO", then may proceed with Cephalosporin use.  Other reaction(s): unknown      Review of Systems  Constitutional:  Negative for chills and fever.  HENT:  Negative for sore throat.   Respiratory:  Negative for cough and shortness of breath.   Cardiovascular:  Negative for chest pain, palpitations and leg swelling.  Gastrointestinal:  Negative for  abdominal pain, blood in stool, constipation, diarrhea, nausea and vomiting.  Genitourinary:  Negative for dysuria and hematuria.  Musculoskeletal:  Negative for myalgias.  Skin:  Negative for itching and rash.  Neurological:  Negative for dizziness and headaches.  Psychiatric/Behavioral:  Negative for depression and suicidal ideas.       Objective:     BP 136/82   Pulse 71   Ht 5\' 11"  (1.803 m)   Wt 205 lb 3.2 oz (93.1 kg)   SpO2 98%   BMI 28.62 kg/m  BP Readings from Last 3 Encounters:  06/29/22 136/82  06/27/22 (!) 149/78  06/05/22 138/80      Physical Exam Vitals reviewed.  Constitutional:      General: He is not in acute distress.    Appearance: Normal appearance. He is not ill-appearing.  HENT:     Head: Normocephalic and atraumatic.     Right Ear: External ear normal.     Left Ear: External ear normal.     Nose: Nose normal. No congestion or rhinorrhea.     Mouth/Throat:     Mouth: Mucous membranes are moist.     Pharynx: Oropharynx is clear.  Eyes:     General: No scleral icterus.    Extraocular Movements: Extraocular movements intact.     Conjunctiva/sclera: Conjunctivae normal.     Pupils: Pupils are equal, round, and reactive to light.  Cardiovascular:     Rate and Rhythm: Normal rate and regular rhythm.     Pulses: Normal pulses.     Heart sounds: Normal heart sounds. No murmur heard. Pulmonary:     Effort: Pulmonary effort is normal.     Breath sounds: Normal breath sounds. No wheezing, rhonchi or rales.  Abdominal:     General: Abdomen is flat. Bowel sounds are normal. There is no distension.     Palpations: Abdomen is soft.     Tenderness: There is no abdominal tenderness.  Musculoskeletal:        General: Deformity (Second ray amputation of left foot with routine healing) present. No swelling. Normal range of motion.     Cervical back: Normal range of motion.  Skin:    General: Skin is warm and dry.     Capillary Refill: Capillary refill  takes less than 2 seconds.  Neurological:     General: No focal deficit present.     Mental Status: He is alert and oriented to person, place, and time.     Motor: No weakness.  Psychiatric:        Mood and Affect: Mood normal.        Behavior: Behavior normal.        Thought Content: Thought content normal.   Last CBC Lab Results  Component Value Date   WBC 10.4 06/01/2022   HGB 11.8 (L) 06/01/2022   HCT 36.4 (L) 06/01/2022  MCV 87.1 06/01/2022   MCH 28.2 06/01/2022   RDW 12.6 06/01/2022   PLT 316 06/01/2022   Last metabolic panel Lab Results  Component Value Date   GLUCOSE 172 (H) 06/02/2022   NA 138 06/02/2022   K 3.6 06/02/2022   CL 105 06/02/2022   CO2 25 06/02/2022   BUN 19 06/02/2022   CREATININE 0.85 06/02/2022   GFRNONAA >60 06/02/2022   CALCIUM 8.9 06/02/2022   PHOS 3.3 12/29/2010   PROT 7.3 05/29/2022   ALBUMIN 3.3 (L) 05/29/2022   BILITOT 0.9 05/29/2022   ALKPHOS 75 05/29/2022   AST 13 (L) 05/29/2022   ALT 14 05/29/2022   ANIONGAP 8 06/02/2022   Last hemoglobin A1c Lab Results  Component Value Date   HGBA1C 14.3 (H) 05/29/2022     Assessment & Plan:   Problem List Items Addressed This Visit       Hypertension - Primary    Presenting today for HTN follow-up.  His current antihypertensive regimen consists of amlodipine 10 mg daily.  BP today is 144/78 initially and 136/82 on repeat. -Add olmesartan 20 mg daily for improved HTN control and renal protection in the setting of poorly controlled diabetes mellitus      Uncontrolled type 2 diabetes mellitus with hyperglycemia (HCC)    Last A1c 14.3.  Reports today that he has made significant dietary changes in light of this result.  Current diabetes medication regimen consists of insulin 70/30 10 units twice daily and metformin XR 1000 mg twice daily.  Currently out of sensors for his Dexcom so no recent blood sugar readings are available.  Denies symptoms of polyuria/polydipsia.  Additionally denies  symptomatic hypoglycemia. -Add Ozempic 0.25 mg weekly      History of CVA (cerebrovascular accident)    Remote history of CVA in 2010.  This occurred in [place].  No residual deficits appreciated.  Patient not able to recall any details. -Start ASA 81 mg daily -Start atorvastatin 40 mg daily -Check lipid panel at follow-up in 2 months       Return in about 2 months (around 08/29/2022) for HTN, HLD, DM.    Billie Lade, MD

## 2022-06-29 NOTE — Patient Instructions (Signed)
It was a pleasure to see you today.  Thank you for giving [place] the opportunity to be involved in your care.  Below is a brief recap of your visit and next steps.  We will plan to see you again in 2 months.  Summary Start Ozempic for diabetes Start olmesartan for hypertension Start aspirin and atorvastatin for stroke

## 2022-07-05 ENCOUNTER — Encounter: Payer: Self-pay | Admitting: Internal Medicine

## 2022-07-05 DIAGNOSIS — Z8673 Personal history of transient ischemic attack (TIA), and cerebral infarction without residual deficits: Secondary | ICD-10-CM | POA: Insufficient documentation

## 2022-07-05 NOTE — Assessment & Plan Note (Signed)
Remote history of CVA in 2010.  This occurred in [place].  No residual deficits appreciated.  Patient not able to recall any details. -Start ASA 81 mg daily -Start atorvastatin 40 mg daily -Check lipid panel at follow-up in 2 months

## 2022-07-05 NOTE — Assessment & Plan Note (Signed)
Last A1c 14.3.  Reports today that he has made significant dietary changes in light of this result.  Current diabetes medication regimen consists of insulin 70/30 10 units twice daily and metformin XR 1000 mg twice daily.  Currently out of sensors for his Dexcom so no recent blood sugar readings are available.  Denies symptoms of polyuria/polydipsia.  Additionally denies symptomatic hypoglycemia. -Add Ozempic 0.25 mg weekly

## 2022-07-05 NOTE — Assessment & Plan Note (Signed)
Presenting today for HTN follow-up.  His current antihypertensive regimen consists of amlodipine 10 mg daily.  BP today is 144/78 initially and 136/82 on repeat. -Add olmesartan 20 mg daily for improved HTN control and renal protection in the setting of poorly controlled diabetes mellitus

## 2022-07-06 ENCOUNTER — Ambulatory Visit (INDEPENDENT_AMBULATORY_CARE_PROVIDER_SITE_OTHER): Payer: 59 | Admitting: Orthopedic Surgery

## 2022-07-06 DIAGNOSIS — L02612 Cutaneous abscess of left foot: Secondary | ICD-10-CM

## 2022-07-06 DIAGNOSIS — I739 Peripheral vascular disease, unspecified: Secondary | ICD-10-CM

## 2022-07-07 ENCOUNTER — Other Ambulatory Visit: Payer: Self-pay | Admitting: *Deleted

## 2022-07-07 DIAGNOSIS — L03116 Cellulitis of left lower limb: Secondary | ICD-10-CM

## 2022-07-07 DIAGNOSIS — M7989 Other specified soft tissue disorders: Secondary | ICD-10-CM

## 2022-07-12 ENCOUNTER — Ambulatory Visit (HOSPITAL_COMMUNITY)
Admission: RE | Admit: 2022-07-12 | Discharge: 2022-07-12 | Disposition: A | Discharge status: Home / Self Care | Payer: 59 | Source: Ambulatory Visit | Attending: Vascular Surgery | Admitting: Vascular Surgery

## 2022-07-12 DIAGNOSIS — M7989 Other specified soft tissue disorders: Secondary | ICD-10-CM | POA: Insufficient documentation

## 2022-07-12 DIAGNOSIS — L03116 Cellulitis of left lower limb: Secondary | ICD-10-CM | POA: Diagnosis not present

## 2022-07-12 LAB — VAS US ABI WITH/WO TBI
Left ABI: 1.07
Right ABI: 1.42

## 2022-07-13 NOTE — Progress Notes (Signed)
Office Note     CC: Left foot wound Requesting Provider:  Billie Lade, MD  HPI: Grant Yang is a 67 y.o. (Apr 14, 1955) male presenting at the request of Dr. 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  On exam, Grant Yang was doing well.  A native of [place], he now lives in [place].  He was a Engineer, structural by trade, but is now retired.  After his divorce in 2010, Grant Yang stopped smoking, as well as stopped using crystal meth.    Grant Yang has had type 2 diabetes for quite some time, fortunately this has been poorly managed.  His last A1c was 14 a few weeks ago.  He is working to bring this down.  Denies symptoms of claudication, ischemic rest pain    The pt is  on a statin for cholesterol management.  The pt is  on a daily aspirin.   Other AC:  - The pt is  on medication for hypertension.   The pt is  diabetic.  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 in 2010 . denies any residual deficits     Past Surgical History:  Procedure Laterality Date   AMPUTATION Left 05/31/2022   Procedure: LEFT 2ND TOE AMPUTATION;  Surgeon: Nadara Mustard, MD;  Location: [facility] OR;  Service: Orthopedics;  Laterality: Left;   APPENDECTOMY     CHOLECYSTECTOMY  12/30/2010   Procedure: LAPAROSCOPIC CHOLECYSTECTOMY;  Surgeon: Dalia Heading;  Location: [facility] ORS;  Service: General;  Laterality: N/A;   I & D EXTREMITY Left 05/31/2022   Procedure: LEFT FOOT DEBRIDEMENT;  Surgeon: Nadara Mustard, MD;  Location: [facility] OR;  Service: Orthopedics;  Laterality: Left;    INCISION AND DRAINAGE ABSCESS Left 02/01/2017   Procedure: INCISION AND DRAINAGE ABSCESS LEFT HAND;  Surgeon: Vickki Hearing, MD;  Location: [facility] ORS;  Service: Orthopedics;  Laterality: Left;   LYMPHADENECTOMY Bilateral 08/02/2017   Procedure: LYMPHADENECTOMY, PELVIC;  Surgeon: Heloise Purpura, MD;  Location: [facility] ORS;  Service: Urology;  Laterality: Bilateral;   ROBOT ASSISTED LAPAROSCOPIC RADICAL PROSTATECTOMY N/A 08/02/2017   Procedure: XI ROBOTIC ASSISTED LAPAROSCOPIC RADICAL PROSTATECTOMY LEVEL 2;  Surgeon: Heloise Purpura, MD;  Location: [facility] ORS;  Service: Urology;  Laterality: N/A;    Social History   Socioeconomic History   Marital status: Divorced    Spouse name: Not on file   Number of children: Not on file   Years of education: Not on file   Highest education level: Not on file  Occupational History   Not on file  Tobacco Use   Smoking status: Former    Packs/day: 2.00    Years: 38.00    Additional pack years: 0.00    Total pack years: 76.00    Types: Cigarettes    Start date: 38    Quit date: 2010    Years since quitting: 14.4   Smokeless tobacco: Never  Substance and Sexual Activity  Alcohol use: No   Drug use: Yes    Types: Marijuana    Comment: occ   Sexual activity: Yes  Other Topics Concern   Not on file  Social History Narrative   Not on file   Social Determinants of Health   Financial Resource Strain: Not on file  Food Insecurity: Not on file  Transportation Needs: Not on file  Physical Activity: Not on file  Stress: Not on file  Social Connections: Not on file  Intimate Partner Violence: Not on file   Family History  Problem Relation Age of Onset   Heart failure Mother    Cancer Father     Current Outpatient Medications  Medication Sig Dispense Refill   albuterol (VENTOLIN HFA) 108 (90 Base) MCG/ACT inhaler Inhale 2 puffs into the lungs every 6 (six) hours as needed for wheezing or shortness of breath. 8 g 0   amLODipine (NORVASC) 10 MG  tablet Take 1 tablet (10 mg total) by mouth daily. 30 tablet 0   aspirin EC 81 MG tablet Take 1 tablet (81 mg total) by mouth daily. Swallow whole. 30 tablet 12   atorvastatin (LIPITOR) 40 MG tablet Take 1 tablet (40 mg total) by mouth daily. 90 tablet 3   blood glucose meter kit and supplies Dispense based on patient and insurance preference. Use up to four times daily as directed. (FOR ICD-10 E10.9, E11.9). 1 each 0   Continuous Blood Gluc Sensor (DEXCOM G7 SENSOR) MISC 1 Units by Does not apply route See admin instructions. Change sensor after 10 days 3 each 2   EPINEPHrine 0.3 mg/0.3 mL IJ SOAJ injection Inject 0.3 mg into the muscle once as needed for up to 1 dose (if patient exhibits significant signs and symptoms of allergic reaction.). 1 each 0   gabapentin (NEURONTIN) 300 MG capsule Take 1 capsule (300 mg total) by mouth 3 (three) times daily as needed. (Patient taking differently: Take 300 mg by mouth at bedtime.) 180 capsule 1   insulin NPH-regular Human (NOVOLIN 70/30) (70-30) 100 UNIT/ML injection Inject 10 Units into the skin 2 (two) times daily with a meal. (Patient taking differently: Inject 30 Units into the skin 2 (two) times daily with a meal.) 10 mL 1   Insulin Syringes, Disposable, U-100 0.3 ML MISC 10 Units by Does not apply route 2 (two) times daily with breakfast and lunch. 60 each 0   metFORMIN (GLUCOPHAGE-XR) 500 MG 24 hr tablet Take 500 mg by mouth once daily x1 week, then increase to 500 mg twice daily x1 week, then 1,000 mg in the AM and 500 mg in the PM x1 week, and finally 1,000 mg twice daily. Take with food. 90 tablet 1   olmesartan (BENICAR) 20 MG tablet Take 1 tablet (20 mg total) by mouth daily. 30 tablet 2   Semaglutide,0.25 or 0.5MG /DOS, (OZEMPIC, 0.25 OR 0.5 MG/DOSE,) 2 MG/3ML SOPN Inject 0.25 mg into the skin once a week. 3 mL 0   No current facility-administered medications for this visit.    Allergies  Allergen Reactions   Penicillins Other (See Comments)     Childhood allergy. Has patient had a PCN reaction causing immediate rash, facial/tongue/throat swelling, SOB or lightheadedness with hypotension: Unknown Has patient had a PCN reaction causing severe rash involving mucus membranes or skin necrosis: Unknown Has patient had a PCN reaction that required hospitalization: Unknown Has patient had a PCN reaction occurring within the last 10 years: No If all of the above answers are "  NO", then may proceed with Cephalosporin use.  Other reaction(s): unknown     REVIEW OF SYSTEMS:  [X]  denotes positive finding, [ ]  denotes negative finding Cardiac  Comments:  Chest pain or chest pressure:    Shortness of breath upon exertion:    Short of breath when lying flat:    Irregular heart rhythm:        Vascular    Pain in calf, thigh, or hip brought on by ambulation:    Pain in feet at night that wakes you up from your sleep:     Blood clot in your veins:    Leg swelling:         Pulmonary    Oxygen at home:    Productive cough:     Wheezing:         Neurologic    Sudden weakness in arms or legs:     Sudden numbness in arms or legs:     Sudden onset of difficulty speaking or slurred speech:    Temporary loss of vision in one eye:     Problems with dizziness:         Gastrointestinal    Blood in stool:     Vomited blood:         Genitourinary    Burning when urinating:     Blood in urine:        Psychiatric    Major depression:         Hematologic    Bleeding problems:    Problems with blood clotting too easily:        Skin    Rashes or ulcers:        Constitutional    Fever or chills:      PHYSICAL EXAMINATION:  There were no vitals filed for this visit.  General:  WDWN in NAD; vital signs documented above Gait: Not observed HENT: WNL, normocephalic Pulmonary: normal non-labored breathing , without wheezing Cardiac: regular HR Abdomen: soft, NT, no masses Skin: without rashes Vascular Exam/Pulses:  Right Left   Radial 2+ (normal) 2+ (normal)  Ulnar    Femoral    Popliteal    DP 2+ (normal) 2+ (normal)  PT     Extremities: with ischemic changes, without Gangrene , without cellulitis; with open wounds;      Musculoskeletal: no muscle wasting or atrophy  Neurologic: A&O X 3;  No focal weakness or paresthesias are detected Psychiatric:  The pt has Normal affect.   Non-Invasive Vascular Imaging:   +-------+-----------+-----------+------------+------------+  ABI/TBIToday's ABIToday's TBIPrevious ABIPrevious TBI  +-------+-----------+-----------+------------+------------+  Right 1.42       0.67                                 +-------+-----------+-----------+------------+------------+  Left  1.07       0.38                                 +-------+-----------+-----------+------------+------------+     ASSESSMENT/PLAN: DEVONTA BOHREN is a 67 y.o. male presenting with critical limb ischemia in the left lower extremity with tissue loss in the lateral aspect of the foot as well as under the first toe.  Fortunately, his second toe ray amputation has healed.  The wound on the lateral aspect of the foot appears like it could have been pressure induced from a  dressing, however it continues to worsen.  He states the wound under his first toe is unchanged. ABIs demonstrate relatively normal waveforms to the level of the ankle, with poor toe pressure, concerning for severe small vessel disease which is common in patients with longstanding diabetes.  We discussed the above at length.  He is aware there is minimal tissue between the dermis and underlying fifth metatarsal.  With his longstanding history of uncontrolled diabetes, and nonhealing wound, he would benefit from left lower extremity angiogram in an effort to define and improve distal perfusion for wound healing.    I asked that he continue his current medication regimen. I discussed the risk and benefits of left lower  extremity angiogram in an effort to define and improve distal perfusion for wound healing, Jensen elected to proceed.    Victorino Sparrow, MD [facility] [phone]

## 2022-07-14 ENCOUNTER — Encounter: Payer: Self-pay | Admitting: Vascular Surgery

## 2022-07-14 ENCOUNTER — Ambulatory Visit (INDEPENDENT_AMBULATORY_CARE_PROVIDER_SITE_OTHER): Payer: 59 | Admitting: Vascular Surgery

## 2022-07-14 VITALS — BP 115/73 | HR 68 | Temp 97.9°F | Resp 20 | Ht 71.0 in | Wt 200.0 lb

## 2022-07-14 DIAGNOSIS — I70245 Atherosclerosis of native arteries of left leg with ulceration of other part of foot: Secondary | ICD-10-CM | POA: Diagnosis not present

## 2022-07-17 ENCOUNTER — Encounter: Payer: Self-pay | Admitting: Orthopedic Surgery

## 2022-07-17 NOTE — Progress Notes (Signed)
Office Visit Note   Patient: Grant Yang           Date of Birth: 1955-08-03           MRN: [id number] Visit Date: 07/06/2022              Requested by: Billie Lade, MD [address] [place],  [place] [address] PCP: Billie Lade, MD  Chief Complaint  Patient presents with   Left Foot - Routine Post Op    05/31/2022 left foot debridement 2nd toe amputation       HPI: Patient is a 67 year old gentleman who is status post second toe amputation.  Patient presents with progressive ischemic changes lateral border left foot.  Assessment & Plan: Visit Diagnoses:  1. Cutaneous abscess of left foot   2. PVD (peripheral vascular disease) (HCC)     Plan: Will refer patient to vascular vein surgery for further vascular workup.  Follow-Up Instructions: No follow-ups on file.   Ortho Exam  Patient is alert, oriented, no adenopathy, well-dressed, normal affect, normal respiratory effort. Examination the second ray amputation is well-healed.  Patient has developed a new ulcer over the lateral aspect of his foot essentially the entire lateral fifth ray as well as an ulcer beneath the great toe.  Imaging: No results found.    Labs: Lab Results  Component Value Date   HGBA1C 14.3 (H) 05/29/2022   HGBA1C 10.4 (H) 01/30/2017   HGBA1C 12.3 (H) 12/28/2010   ESRSEDRATE 50 (H) 06/01/2022   ESRSEDRATE 17 (H) 01/30/2017   CRP 0.6 06/01/2022   CRP 6.6 (H) 01/30/2017   REPTSTATUS 06/10/2022 FINAL 05/31/2022   GRAMSTAIN  05/31/2022    FEW WBC PRESENT, PREDOMINANTLY PMN NO ORGANISMS SEEN   CULT  05/31/2022    FEW GRAM POSITIVE COCCI SENT TO [facility] FOR IDENTIFICATION AND SUSCEPTIBILITY TESTING NO ANAEROBES ISOLATED SEE SEPARATE REPORT Performed at [facility], [address], [place], [place] [address]      Lab Results  Component Value Date   ALBUMIN 3.3 (L) 05/29/2022   ALBUMIN 3.1 (L) 02/03/2017   ALBUMIN 3.1 (L) 01/31/2017    Lab Results  Component  Value Date   MG 2.0 06/01/2022   MG 1.9 12/29/2010   No results found for: "VD25OH"  No results found for: "PREALBUMIN"    Latest Ref Rng & Units 06/01/2022    1:35 AM 05/31/2022    1:49 AM 05/29/2022    3:06 PM  CBC EXTENDED  WBC 4.0 - 10.5 K/uL 10.4  9.3  10.6   RBC 4.22 - 5.81 MIL/uL 4.18  4.42  4.67   Hemoglobin 13.0 - 17.0 g/dL 41.3  24.4  01.0   HCT 39.0 - 52.0 % 36.4  38.4  40.8   Platelets 150 - 400 K/uL 316  336  382   NEUT# 1.7 - 7.7 K/uL   6.3   Lymph# 0.7 - 4.0 K/uL   2.7      There is no height or weight on file to calculate BMI.  Orders:  Orders Placed This Encounter  Procedures   Ambulatory referral to Vascular Surgery   No orders of the defined types were placed in this encounter.    Procedures: No procedures performed  Clinical Data: No additional findings.  ROS:  All other systems negative, except as noted in the HPI. Review of Systems  Objective: Vital Signs: There were no vitals taken for this visit.  Specialty  Comments:  No specialty comments available.  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) 05/29/2022   Prostate cancer (HCC) 01/30/2017   Cellulitis of left hand 02/01/17 I and D 01/30/2017   Hypertension 01/30/2017   Uncontrolled type 2 diabetes mellitus with hyperglycemia (HCC) 01/30/2017   Past Medical History:  Diagnosis Date   Cancer (HCC)    prostate cancer    Diabetes mellitus    GERD (gastroesophageal reflux disease)    History of kidney stones    Hypertension    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 in 2010 . denies any residual deficits     Family History  Problem Relation Age of Onset   Heart failure Mother    Cancer Father     Past Surgical History:  Procedure Laterality Date   AMPUTATION Left 05/31/2022   Procedure: LEFT 2ND TOE AMPUTATION;  Surgeon: Nadara Mustard, MD;  Location: [facility] OR;  Service: Orthopedics;  Laterality: Left;   APPENDECTOMY     CHOLECYSTECTOMY  12/30/2010   Procedure: LAPAROSCOPIC CHOLECYSTECTOMY;  Surgeon: Dalia Heading;  Location: [facility];  Service: General;  Laterality: N/A;   I & D EXTREMITY Left 05/31/2022   Procedure: LEFT FOOT DEBRIDEMENT;  Surgeon: Nadara Mustard, MD;  Location: [facility] OR;  Service: Orthopedics;  Laterality: Left;   INCISION AND DRAINAGE ABSCESS Left 02/01/2017   Procedure: INCISION AND DRAINAGE ABSCESS LEFT HAND;  Surgeon: Vickki Hearing, MD;  Location: [facility];  Service: Orthopedics;  Laterality: Left;   LYMPHADENECTOMY Bilateral 08/02/2017   Procedure: LYMPHADENECTOMY, PELVIC;  Surgeon: Heloise Purpura, MD;  Location: [facility];  Service: Urology;  Laterality: Bilateral;   ROBOT ASSISTED LAPAROSCOPIC RADICAL PROSTATECTOMY N/A 08/02/2017   Procedure: XI ROBOTIC ASSISTED LAPAROSCOPIC RADICAL PROSTATECTOMY LEVEL 2;  Surgeon: Heloise Purpura, MD;  Location: [facility];  Service: Urology;  Laterality: N/A;   Social History   Occupational History   Not on file  Tobacco Use   Smoking status: Former    Packs/day: 2.00    Years: 38.00    Additional pack years: 0.00    Total pack years: 76.00    Types: Cigarettes    Start date: 31    Quit date: 2010    Years since quitting: 14.4   Smokeless tobacco: Never  Substance and Sexual Activity   Alcohol use: No   Drug use: Yes    Types: Marijuana    Comment: occ   Sexual activity: Yes

## 2022-07-19 ENCOUNTER — Other Ambulatory Visit: Payer: Self-pay

## 2022-07-19 DIAGNOSIS — I70245 Atherosclerosis of native arteries of left leg with ulceration of other part of foot: Secondary | ICD-10-CM

## 2022-07-24 DIAGNOSIS — I10 Essential (primary) hypertension: Secondary | ICD-10-CM | POA: Diagnosis not present

## 2022-07-24 DIAGNOSIS — Z833 Family history of diabetes mellitus: Secondary | ICD-10-CM | POA: Diagnosis not present

## 2022-07-24 DIAGNOSIS — Z89422 Acquired absence of other left toe(s): Secondary | ICD-10-CM | POA: Diagnosis not present

## 2022-07-24 DIAGNOSIS — R32 Unspecified urinary incontinence: Secondary | ICD-10-CM | POA: Diagnosis not present

## 2022-07-24 DIAGNOSIS — Z794 Long term (current) use of insulin: Secondary | ICD-10-CM | POA: Diagnosis not present

## 2022-07-24 DIAGNOSIS — Z7982 Long term (current) use of aspirin: Secondary | ICD-10-CM | POA: Diagnosis not present

## 2022-07-24 DIAGNOSIS — Z7984 Long term (current) use of oral hypoglycemic drugs: Secondary | ICD-10-CM | POA: Diagnosis not present

## 2022-07-24 DIAGNOSIS — E1142 Type 2 diabetes mellitus with diabetic polyneuropathy: Secondary | ICD-10-CM | POA: Diagnosis not present

## 2022-07-24 DIAGNOSIS — E785 Hyperlipidemia, unspecified: Secondary | ICD-10-CM | POA: Diagnosis not present

## 2022-07-24 DIAGNOSIS — J449 Chronic obstructive pulmonary disease, unspecified: Secondary | ICD-10-CM | POA: Diagnosis not present

## 2022-07-24 DIAGNOSIS — Z880 Allergy status to penicillin: Secondary | ICD-10-CM | POA: Diagnosis not present

## 2022-07-24 DIAGNOSIS — Z8249 Family history of ischemic heart disease and other diseases of the circulatory system: Secondary | ICD-10-CM | POA: Diagnosis not present

## 2022-07-26 ENCOUNTER — Encounter (HOSPITAL_COMMUNITY)
Admission: RE | Disposition: A | Discharge status: Home / Self Care | Payer: Self-pay | Source: Home / Self Care | Attending: Vascular Surgery

## 2022-07-26 ENCOUNTER — Telehealth: Payer: Self-pay

## 2022-07-26 ENCOUNTER — Other Ambulatory Visit: Payer: Self-pay

## 2022-07-26 ENCOUNTER — Ambulatory Visit (HOSPITAL_COMMUNITY)
Admission: RE | Admit: 2022-07-26 | Discharge: 2022-07-26 | Disposition: A | Discharge status: Home / Self Care | Payer: 59 | Attending: Vascular Surgery | Admitting: Vascular Surgery

## 2022-07-26 DIAGNOSIS — I70245 Atherosclerosis of native arteries of left leg with ulceration of other part of foot: Secondary | ICD-10-CM

## 2022-07-26 DIAGNOSIS — Z539 Procedure and treatment not carried out, unspecified reason: Secondary | ICD-10-CM | POA: Diagnosis not present

## 2022-07-26 LAB — POCT I-STAT, CHEM 8
BUN: 30 mg/dL — ABNORMAL HIGH (ref 8–23)
Calcium, Ion: 1.21 mmol/L (ref 1.15–1.40)
Chloride: 101 mmol/L (ref 98–111)
Creatinine, Ser: 1.3 mg/dL — ABNORMAL HIGH (ref 0.61–1.24)
Glucose, Bld: 190 mg/dL — ABNORMAL HIGH (ref 70–99)
HCT: 43 % (ref 39.0–52.0)
Hemoglobin: 14.6 g/dL (ref 13.0–17.0)
Potassium: 4.3 mmol/L (ref 3.5–5.1)
Sodium: 137 mmol/L (ref 135–145)
TCO2: 26 mmol/L (ref 22–32)

## 2022-07-26 SURGERY — ABDOMINAL AORTOGRAM W/LOWER EXTREMITY
Anesthesia: LOCAL

## 2022-07-26 MED ORDER — SODIUM CHLORIDE 0.9 % IV SOLN: INTRAVENOUS | Status: DC

## 2022-07-26 MED ORDER — LOPERAMIDE HCL 2 MG PO CAPS
4.0000 mg | ORAL_CAPSULE | Freq: Once | ORAL | Status: AC
  Administered 2022-07-26: 4 mg via ORAL
  Filled 2022-07-26 (×2): qty 2

## 2022-07-26 NOTE — Telephone Encounter (Signed)
Attempted to reach patient to reschedule aortogram that was scheduled for today but cancelled due to change in provider's schedule. Left VM for patient to return call.

## 2022-07-27 ENCOUNTER — Other Ambulatory Visit: Payer: Self-pay

## 2022-07-27 DIAGNOSIS — I70245 Atherosclerosis of native arteries of left leg with ulceration of other part of foot: Secondary | ICD-10-CM

## 2022-07-27 NOTE — Telephone Encounter (Signed)
Spoke with patient. Aortogram rescheduled for 6/13 with Dr. Chestine Spore. Instructions reviewed and pt verbalized understanding.

## 2022-07-28 ENCOUNTER — Telehealth: Payer: Self-pay | Admitting: Internal Medicine

## 2022-07-30 ENCOUNTER — Emergency Department (HOSPITAL_COMMUNITY): Payer: 59

## 2022-07-30 ENCOUNTER — Encounter (HOSPITAL_COMMUNITY): Payer: Self-pay | Admitting: *Deleted

## 2022-07-30 ENCOUNTER — Other Ambulatory Visit: Payer: Self-pay

## 2022-07-30 ENCOUNTER — Inpatient Hospital Stay (HOSPITAL_COMMUNITY)
Admission: EM | Admit: 2022-07-30 | Discharge: 2022-08-04 | DRG: 254 | Disposition: A | Discharge status: Home / Self Care | Payer: 59 | Attending: Internal Medicine | Admitting: Internal Medicine

## 2022-07-30 DIAGNOSIS — Z794 Long term (current) use of insulin: Secondary | ICD-10-CM

## 2022-07-30 DIAGNOSIS — E11621 Type 2 diabetes mellitus with foot ulcer: Secondary | ICD-10-CM | POA: Diagnosis not present

## 2022-07-30 DIAGNOSIS — Z7985 Long-term (current) use of injectable non-insulin antidiabetic drugs: Secondary | ICD-10-CM | POA: Diagnosis not present

## 2022-07-30 DIAGNOSIS — Z7982 Long term (current) use of aspirin: Secondary | ICD-10-CM | POA: Diagnosis not present

## 2022-07-30 DIAGNOSIS — Z87891 Personal history of nicotine dependence: Secondary | ICD-10-CM | POA: Diagnosis not present

## 2022-07-30 DIAGNOSIS — L97521 Non-pressure chronic ulcer of other part of left foot limited to breakdown of skin: Secondary | ICD-10-CM | POA: Diagnosis not present

## 2022-07-30 DIAGNOSIS — S91302A Unspecified open wound, left foot, initial encounter: Secondary | ICD-10-CM | POA: Diagnosis not present

## 2022-07-30 DIAGNOSIS — K219 Gastro-esophageal reflux disease without esophagitis: Secondary | ICD-10-CM | POA: Diagnosis present

## 2022-07-30 DIAGNOSIS — E1151 Type 2 diabetes mellitus with diabetic peripheral angiopathy without gangrene: Secondary | ICD-10-CM | POA: Diagnosis not present

## 2022-07-30 DIAGNOSIS — Z7984 Long term (current) use of oral hypoglycemic drugs: Secondary | ICD-10-CM | POA: Diagnosis not present

## 2022-07-30 DIAGNOSIS — M79662 Pain in left lower leg: Secondary | ICD-10-CM | POA: Diagnosis not present

## 2022-07-30 DIAGNOSIS — I70249 Atherosclerosis of native arteries of left leg with ulceration of unspecified site: Secondary | ICD-10-CM | POA: Diagnosis not present

## 2022-07-30 DIAGNOSIS — L98499 Non-pressure chronic ulcer of skin of other sites with unspecified severity: Secondary | ICD-10-CM | POA: Diagnosis not present

## 2022-07-30 DIAGNOSIS — Z89422 Acquired absence of other left toe(s): Secondary | ICD-10-CM

## 2022-07-30 DIAGNOSIS — Z8249 Family history of ischemic heart disease and other diseases of the circulatory system: Secondary | ICD-10-CM | POA: Diagnosis not present

## 2022-07-30 DIAGNOSIS — I10 Essential (primary) hypertension: Secondary | ICD-10-CM | POA: Diagnosis not present

## 2022-07-30 DIAGNOSIS — E1140 Type 2 diabetes mellitus with diabetic neuropathy, unspecified: Secondary | ICD-10-CM | POA: Diagnosis not present

## 2022-07-30 DIAGNOSIS — L97528 Non-pressure chronic ulcer of other part of left foot with other specified severity: Principal | ICD-10-CM

## 2022-07-30 DIAGNOSIS — E785 Hyperlipidemia, unspecified: Secondary | ICD-10-CM | POA: Diagnosis not present

## 2022-07-30 DIAGNOSIS — E1165 Type 2 diabetes mellitus with hyperglycemia: Secondary | ICD-10-CM | POA: Diagnosis not present

## 2022-07-30 DIAGNOSIS — L97529 Non-pressure chronic ulcer of other part of left foot with unspecified severity: Secondary | ICD-10-CM | POA: Diagnosis present

## 2022-07-30 DIAGNOSIS — I70245 Atherosclerosis of native arteries of left leg with ulceration of other part of foot: Secondary | ICD-10-CM | POA: Diagnosis present

## 2022-07-30 DIAGNOSIS — Z8546 Personal history of malignant neoplasm of prostate: Secondary | ICD-10-CM | POA: Diagnosis not present

## 2022-07-30 DIAGNOSIS — J449 Chronic obstructive pulmonary disease, unspecified: Secondary | ICD-10-CM | POA: Diagnosis not present

## 2022-07-30 DIAGNOSIS — Z880 Allergy status to penicillin: Secondary | ICD-10-CM

## 2022-07-30 DIAGNOSIS — Z8673 Personal history of transient ischemic attack (TIA), and cerebral infarction without residual deficits: Secondary | ICD-10-CM

## 2022-07-30 DIAGNOSIS — D72828 Other elevated white blood cell count: Secondary | ICD-10-CM | POA: Diagnosis not present

## 2022-07-30 DIAGNOSIS — Z79899 Other long term (current) drug therapy: Secondary | ICD-10-CM | POA: Diagnosis not present

## 2022-07-30 DIAGNOSIS — T82858A Stenosis of vascular prosthetic devices, implants and grafts, initial encounter: Secondary | ICD-10-CM | POA: Diagnosis not present

## 2022-07-30 LAB — CBC WITH DIFFERENTIAL/PLATELET
Abs Immature Granulocytes: 0.14 10*3/uL — ABNORMAL HIGH (ref 0.00–0.07)
Basophils Absolute: 0.1 10*3/uL (ref 0.0–0.1)
Basophils Relative: 0 %
Eosinophils Absolute: 0.5 10*3/uL (ref 0.0–0.5)
Eosinophils Relative: 3 %
HCT: 39.8 % (ref 39.0–52.0)
Hemoglobin: 13.3 g/dL (ref 13.0–17.0)
Immature Granulocytes: 1 %
Lymphocytes Relative: 12 %
Lymphs Abs: 2.3 10*3/uL (ref 0.7–4.0)
MCH: 27.5 pg (ref 26.0–34.0)
MCHC: 33.4 g/dL (ref 30.0–36.0)
MCV: 82.2 fL (ref 80.0–100.0)
Monocytes Absolute: 1.5 10*3/uL — ABNORMAL HIGH (ref 0.1–1.0)
Monocytes Relative: 8 %
Neutro Abs: 15.4 10*3/uL — ABNORMAL HIGH (ref 1.7–7.7)
Neutrophils Relative %: 76 %
Platelets: 470 10*3/uL — ABNORMAL HIGH (ref 150–400)
RBC: 4.84 MIL/uL (ref 4.22–5.81)
RDW: 13.3 % (ref 11.5–15.5)
WBC: 20 10*3/uL — ABNORMAL HIGH (ref 4.0–10.5)
nRBC: 0 % (ref 0.0–0.2)

## 2022-07-30 LAB — BASIC METABOLIC PANEL
Anion gap: 10 (ref 5–15)
BUN: 33 mg/dL — ABNORMAL HIGH (ref 8–23)
CO2: 27 mmol/L (ref 22–32)
Calcium: 9.7 mg/dL (ref 8.9–10.3)
Chloride: 98 mmol/L (ref 98–111)
Creatinine, Ser: 1.15 mg/dL (ref 0.61–1.24)
GFR, Estimated: >60 mL/min (ref >60)
Glucose, Bld: 95 mg/dL (ref 70–99)
Potassium: 3.8 mmol/L (ref 3.5–5.1)
Sodium: 135 mmol/L (ref 135–145)

## 2022-07-30 LAB — SEDIMENTATION RATE: Sed Rate: 86 mm/hr — ABNORMAL HIGH (ref 0–16)

## 2022-07-30 MED ORDER — VANCOMYCIN HCL IN DEXTROSE 1-5 GM/200ML-% IV SOLN
1000.0000 mg | Freq: Once | INTRAVENOUS | Status: AC
  Administered 2022-07-30: 1000 mg via INTRAVENOUS
  Filled 2022-07-30: qty 200

## 2022-07-30 MED ORDER — SODIUM CHLORIDE 0.9 % IV SOLN
2.0000 g | Freq: Once | INTRAVENOUS | Status: AC
  Administered 2022-07-30: 2 g via INTRAVENOUS
  Filled 2022-07-30: qty 12.5

## 2022-07-30 NOTE — Progress Notes (Signed)
Appreciate hospital medicine. Please make NPO midnight. Will see in the AM if pt has arrived at [facility].   Possible angiogram tomorrow pending availability.   Victorino Sparrow MD

## 2022-07-30 NOTE — ED Provider Notes (Signed)
   Patient signed out to me pending completion of workup.  Patient here for evaluation of wound to left foot.  Patient reports the wound is getting worse and family member concerned about osteomyelitis.  He was seen by vascular surgery 07/14/2022 referred by PCP for critical limb ischemia.  He recently underwent amputation of second toe and has a chronic appearing wound to the lateral aspect of the left foot and lower extremity angiogram was ordered.  See previous provider note for complete H&P.   Consulted vascular surgery, Dr. Sherral Hammers who recommends medicine admit with transfer to [facility], IV antibiotics and he will see in consultation tomorrow.  Discussed with [facility] hospitalist, Dr. Mariea Clonts who agrees to admit and arrange for transfer to [facility].     DG Foot Complete Left  Result Date: 07/30/2022 CLINICAL DATA:  Lateral left foot wound. EXAM: LEFT FOOT - COMPLETE 3+ VIEW COMPARISON:  May 29, 2022 FINDINGS: There is evidence of prior transmetatarsal amputation of the second left toe. A very small nondisplaced fracture deformity is seen involving the proximal tip of the base of the fifth left metatarsal. This represents a new finding when compared to the prior study. There is no evidence of dislocation. A 3.8 cm x 4.5 cm soft tissue defect is seen along the lateral aspect of the mid left foot. This is adjacent to the proximal aspect of the fifth left metatarsal. No adjacent cortical destruction is identified. IMPRESSION: 1. Soft tissue defect along the lateral aspect of the mid left foot, as described above, without evidence of acute osteomyelitis. 2. Very small nondisplaced fracture deformity involving the proximal tip of the base of the fifth left metatarsal. 3. 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 Electronically Signed   By: Aram Candela M.D.   On: 07/30/2022 19:25    Labs Reviewed  CBC WITH DIFFERENTIAL/PLATELET - Abnormal; Notable for the following  components:      Result Value   WBC 20.0 (*)    Platelets 470 (*)    Neutro Abs 15.4 (*)    Monocytes Absolute 1.5 (*)    Abs Immature Granulocytes 0.14 (*)    All other components within normal limits  BASIC METABOLIC PANEL - Abnormal; Notable for the following components:   BUN 33 (*)    All other components within normal limits  SEDIMENTATION RATE - Abnormal; Notable for the following components:   Sed Rate 86 (*)    All other components within normal limits  C-REACTIVE PROTEIN            Pauline Aus, PA-C 07/30/22 2203    Bethann Berkshire, MD 08/01/22 1148

## 2022-07-30 NOTE — Assessment & Plan Note (Addendum)
Uncontrolled. - HgbA1c- 14.3 - SSI- M -Hold home 70/30 10 units twice daily for now

## 2022-07-30 NOTE — Assessment & Plan Note (Addendum)
Chronic nonhealing left foot ulcer, appears infected.  Leukocytosis of 20.  Rules out for sepsis.  X-ray without evidence of osteomyelitis, ESR elevated at 86.  Recent ABIs 06/2022-normal ankle-brachial index, abnormal toe brachial index.  Patient previously had aortogram planned for 08/03/2022.  - EDP talked to Vascular surgery- admit to [facility], will see in consult - Follow-up CRP -continue broad-spectrum antibiotics IV vancomycin and cefepime -N.p.o. midnight

## 2022-07-30 NOTE — Assessment & Plan Note (Signed)
.    Stable. -Resume aspirin, statins 

## 2022-07-30 NOTE — Assessment & Plan Note (Addendum)
Stable. -Resume olmesartan,

## 2022-07-30 NOTE — H&P (Signed)
History and Physical    BYNUM REIGEL ZOX:[id number] DOB: 1955-08-17 DOA: 07/30/2022  PCP: Billie Lade, MD   Patient coming from: Home  I have personally briefly reviewed patient's old medical records in [facility]  Chief Complaint: Unhealing LLE Ulcer  HPI: Grant Yang is a 67 y.o. male with medical history significant for osteomyelitis of the left lower extremity, diabetes mellitus, COPD, hypertension, CVA, prostate cancer. Patient reports chronic non healing ulcer to his left lower extremity over the past several weeks, but reports over the past few days, and has appeared worse, with foul smell.  No fevers no chills.  Mild pain to ulcer area.  ED Course: Stable vitals.  Tmax 98.2.  Heart rate 70s to 80s.  Respiratory 15-18.  WBC 20.  ESR 86.  Left foot wound shows soft tissue defect around the lateral aspect of the medial left foot, no evidence of acute osteomyelitis. EDP to vascular surgery, DrMarland Kitchen Karin Lieu, recommend admission to [facility], will see in consult. IV Vanco and cefepime started.  Review of Systems: As per HPI all other systems reviewed and negative.  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 [place] in 2010 . denies any residual deficits     Past Surgical History:  Procedure Laterality Date   AMPUTATION Left 05/31/2022   Procedure: LEFT 2ND TOE AMPUTATION;  Surgeon: Nadara Mustard, MD;  Location: [facility] OR;  Service: Orthopedics;  Laterality: Left;   APPENDECTOMY     CHOLECYSTECTOMY  12/30/2010   Procedure: LAPAROSCOPIC CHOLECYSTECTOMY;  Surgeon: Dalia Heading;   Location: [facility] ORS;  Service: General;  Laterality: N/A;   I & D EXTREMITY Left 05/31/2022   Procedure: LEFT FOOT DEBRIDEMENT;  Surgeon: Nadara Mustard, MD;  Location: [facility] OR;  Service: Orthopedics;  Laterality: Left;   INCISION AND DRAINAGE ABSCESS Left 02/01/2017   Procedure: INCISION AND DRAINAGE ABSCESS LEFT HAND;  Surgeon: Vickki Hearing, MD;  Location: [facility] ORS;  Service: Orthopedics;  Laterality: Left;   LYMPHADENECTOMY Bilateral 08/02/2017   Procedure: LYMPHADENECTOMY, PELVIC;  Surgeon: Heloise Purpura, MD;  Location: [facility] ORS;  Service: Urology;  Laterality: Bilateral;   ROBOT ASSISTED LAPAROSCOPIC RADICAL PROSTATECTOMY N/A 08/02/2017   Procedure: XI ROBOTIC ASSISTED LAPAROSCOPIC RADICAL PROSTATECTOMY LEVEL 2;  Surgeon: Heloise Purpura, MD;  Location: [facility] ORS;  Service: Urology;  Laterality: N/A;     reports that he quit smoking about 14 years ago. His smoking use included cigarettes. He started smoking about 52 years ago. He has a 76.00 pack-year smoking history. He has never used smokeless tobacco. He reports current drug use. Drug: Marijuana. He reports that he does not drink alcohol.  Allergies  Allergen Reactions   Penicillins Other (See Comments)    Unknown childhood allergy    Family History  Problem Relation Age of Onset   Heart failure Mother    Cancer Father    Prior to Admission medications   Medication Sig Start Date End Date Taking? Authorizing Provider  albuterol (VENTOLIN  HFA) 108 (90 Base) MCG/ACT inhaler Inhale 2 puffs into the lungs every 6 (six) hours as needed for wheezing or shortness of breath. 05/29/22   Gardenia Phlegm, MD  amLODipine (NORVASC) 10 MG tablet Take 1 tablet (10 mg total) by mouth daily. Patient not taking: Reported on 07/20/2022 06/03/22 07/20/22  Lanae Boast, MD  aspirin EC 81 MG tablet Take 1 tablet (81 mg total) by mouth daily. Swallow whole. 06/29/22   Billie Lade, MD  atorvastatin (LIPITOR) 40 MG tablet Take 1 tablet (40 mg total) by mouth daily.  06/29/22   Billie Lade, MD  blood glucose meter kit and supplies Dispense based on patient and insurance preference. Use up to four times daily as directed. (FOR ICD-10 E10.9, E11.9). 02/03/17   Langston Reusing, MD  Continuous Blood Gluc Sensor (DEXCOM G7 SENSOR) MISC 1 Units by Does not apply route See admin instructions. Change sensor after 10 days 06/05/22   Gardenia Phlegm, MD  EPINEPHrine 0.3 mg/0.3 mL IJ SOAJ injection Inject 0.3 mg into the muscle once as needed for up to 1 dose (if patient exhibits significant signs and symptoms of allergic reaction.). 06/02/22   Lanae Boast, MD  gabapentin (NEURONTIN) 300 MG capsule Take 1 capsule (300 mg total) by mouth 3 (three) times daily as needed. Patient taking differently: Take 600 mg by mouth daily at 12 noon. 05/29/22   Gardenia Phlegm, MD  insulin NPH-regular Human (NOVOLIN 70/30) (70-30) 100 UNIT/ML injection Inject 10 Units into the skin 2 (two) times daily with a meal. Patient taking differently: Inject 15 Units into the skin 2 (two) times daily with a meal. With breakfast and lunch 02/03/17   Langston Reusing, MD  Insulin Syringes, Disposable, U-100 0.3 ML MISC 10 Units by Does not apply route 2 (two) times daily with breakfast and lunch. 02/03/17   Langston Reusing, MD  metFORMIN (GLUCOPHAGE-XR) 500 MG 24 hr tablet Take 500 mg by mouth once daily x1 week, then increase to 500 mg twice daily x1 week, then 1,000 mg in the AM and 500 mg in the PM x1 week, and finally 1,000 mg twice daily. Take with food. Patient taking differently: Take 1,000 mg by mouth 2 (two) times daily. 05/29/22   Gardenia Phlegm, MD  Naphazoline-Pheniramine (VISINE OP) Place 1 drop into both eyes daily as needed (blurry eyes).    [provider]  olmesartan (BENICAR) 20 MG tablet Take 1 tablet (20 mg total) by mouth daily. 06/29/22 09/27/22  Billie Lade, MD  Semaglutide,0.25 or 0.5MG /DOS, (OZEMPIC, 0.25 OR 0.5 MG/DOSE,) 2 MG/3ML SOPN Inject 0.25 mg into the  skin once a week. Patient not taking: Reported on 07/20/2022 06/29/22   Billie Lade, MD    Physical Exam: Vitals:   07/30/22 1800 07/30/22 1803  BP:  115/65  Pulse:  78  Resp:  18  Temp:  98.2 F (36.8 C)  TempSrc:  Oral  SpO2:  100%  Weight: 94.3 kg   Height: 5\' 11"  (1.803 m)     Constitutional: NAD, calm, comfortable Vitals:   07/30/22 1800 07/30/22 1803  BP:  115/65  Pulse:  78  Resp:  18  Temp:  98.2 F (36.8 C)  TempSrc:  Oral  SpO2:  100%  Weight: 94.3 kg   Height: 5\' 11"  (1.803 m)    Eyes: PERRL, lids and conjunctivae normal ENMT: Mucous membranes are moist.  Neck: normal, supple, no masses, no thyromegaly Respiratory: clear to  auscultation bilaterally, no wheezing, no crackles. Normal respiratory effort. No accessory muscle use.  Cardiovascular: Regular rate and rhythm, no murmurs / rubs / gallops. No extremity edema. Abdomen: no tenderness, no masses palpated. No hepatosplenomegaly. Bowel sounds positive.  Musculoskeletal: no clubbing / cyanosis. No joint deformity upper and lower extremities. Good ROM, no contractures. Normal muscle tone.  Skin: Chronic foul-smelling ulcer to lateral aspect of left foot, also ulcer to ventral surface of big toe in flexion crease.  Bilateral lower extremities warm with surrounding erythema, no appreciable trend tenderness, no significant drainage. Neurologic: Moving extremities spontaneously. Psychiatric: Normal judgment and insight. Alert and oriented x 3. Normal mood.     Labs on Admission: I have personally reviewed following labs and imaging studies  CBC: Recent Labs  Lab 07/26/22 0808 07/30/22 1845  WBC  --  20.0*  NEUTROABS  --  15.4*  HGB 14.6 13.3  HCT 43.0 39.8  MCV  --  82.2  PLT  --  470*   Basic Metabolic Panel: Recent Labs  Lab 07/26/22 0808 07/30/22 1845  NA 137 135  K 4.3 3.8  CL 101 98  CO2  --  27  GLUCOSE 190* 95  BUN 30* 33*  CREATININE 1.30* 1.15  CALCIUM  --  9.7    Radiological  Exams on Admission: DG Foot Complete Left  Result Date: 07/30/2022 CLINICAL DATA:  Lateral left foot wound. EXAM: LEFT FOOT - COMPLETE 3+ VIEW COMPARISON:  May 29, 2022 FINDINGS: There is evidence of prior transmetatarsal amputation of the second left toe. A very small nondisplaced fracture deformity is seen involving the proximal tip of the base of the fifth left metatarsal. This represents a new finding when compared to the prior study. There is no evidence of dislocation. A 3.8 cm x 4.5 cm soft tissue defect is seen along the lateral aspect of the mid left foot. This is adjacent to the proximal aspect of the fifth left metatarsal. No adjacent cortical destruction is identified. IMPRESSION: 1. Soft tissue defect along the lateral aspect of the mid left foot, as described above, without evidence of acute osteomyelitis. 2. Very small nondisplaced fracture deformity involving the proximal tip of the base of the fifth left metatarsal. 3. 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 Electronically Signed   By: Aram Candela M.D.   On: 07/30/2022 19:25    EKG: None .   Assessment/Plan Principal Problem:   Foot ulcer, left (HCC) Active Problems:   Hypertension   Uncontrolled type 2 diabetes mellitus with hyperglycemia (HCC)   COPD suggested by initial evaluation (HCC)   History of CVA (cerebrovascular accident)  Assessment and Plan: * Foot ulcer, left (HCC) Chronic nonhealing left foot ulcer, appears infected.  Leukocytosis of 20.  Rules out for sepsis.  X-ray without evidence of osteomyelitis, ESR elevated at 86.  Recent ABIs 06/2022-normal ankle-brachial index, abnormal toe brachial index.  Patient previously had aortogram planned for 08/03/2022.  - EDP talked to Vascular surgery- admit to [facility], will see in consult - Follow-up CRP -continue broad-spectrum antibiotics IV vancomycin and cefepime -N.p.o. midnight  History of CVA (cerebrovascular  accident) Stable. -Resume aspirin, statins  Uncontrolled type 2 diabetes mellitus with hyperglycemia (HCC) Uncontrolled. - HgbA1c- 14.3 - SSI- M -Hold home 70/30 10 units twice daily for now   Hypertension Stable. -Resume olmesartan,    DVT prophylaxis: heparin Code Status: FULL code Family Communication: Son- Ladona Ridgel at bedside Disposition Plan: ~ 2 days Consults called: Vascular  Surg Admission status:  Obs Med surg    Author: Onnie Boer, MD 07/30/2022 11:54 PM  For on call review [email or web address].

## 2022-07-30 NOTE — ED Triage Notes (Signed)
Pt with wound to left foot. Wound is getting worse per pt .

## 2022-07-30 NOTE — ED Provider Notes (Cosign Needed Addendum)
[place] EMERGENCY DEPARTMENT AT [facility] Provider Note   CSN: [id number] Arrival date & time: 07/30/22  1755     History  Chief Complaint  Patient presents with   Wound Check    Grant Yang is a 67 y.o. male.  Prostate cancer in remission and osteomyelitis of the second toe of the left foot status post amputation with good healing.  Also has history of diabetes.  Presents the ER for left foot wound with has been ongoing since May, states it is getting worse and today his son told him elevated got to the bone.  He was seen by vascular surgery this week and scheduled for an angiogram for possible reperfusion.  Denies fevers or chills or pain, no nausea or vomiting or other symptoms.  He does report foul smell to the foot.   Wound Check       Home Medications Prior to Admission medications   Medication Sig Start Date End Date Taking? Authorizing Provider  albuterol (VENTOLIN HFA) 108 (90 Base) MCG/ACT inhaler Inhale 2 puffs into the lungs every 6 (six) hours as needed for wheezing or shortness of breath. 05/29/22   Gardenia Phlegm, MD  amLODipine (NORVASC) 10 MG tablet Take 1 tablet (10 mg total) by mouth daily. Patient not taking: Reported on 07/20/2022 06/03/22 07/20/22  Lanae Boast, MD  aspirin EC 81 MG tablet Take 1 tablet (81 mg total) by mouth daily. Swallow whole. 06/29/22   Billie Lade, MD  atorvastatin (LIPITOR) 40 MG tablet Take 1 tablet (40 mg total) by mouth daily. 06/29/22   Billie Lade, MD  blood glucose meter kit and supplies Dispense based on patient and insurance preference. Use up to four times daily as directed. (FOR ICD-10 E10.9, E11.9). 02/03/17   Langston Reusing, MD  Continuous Blood Gluc Sensor (DEXCOM G7 SENSOR) MISC 1 Units by Does not apply route See admin instructions. Change sensor after 10 days 06/05/22   Gardenia Phlegm, MD  EPINEPHrine 0.3 mg/0.3 mL IJ SOAJ injection Inject 0.3 mg into the muscle once as needed for up to 1 dose (if  patient exhibits significant signs and symptoms of allergic reaction.). 06/02/22   Lanae Boast, MD  gabapentin (NEURONTIN) 300 MG capsule Take 1 capsule (300 mg total) by mouth 3 (three) times daily as needed. Patient taking differently: Take 600 mg by mouth daily at 12 noon. 05/29/22   Gardenia Phlegm, MD  insulin NPH-regular Human (NOVOLIN 70/30) (70-30) 100 UNIT/ML injection Inject 10 Units into the skin 2 (two) times daily with a meal. Patient taking differently: Inject 15 Units into the skin 2 (two) times daily with a meal. With breakfast and lunch 02/03/17   Langston Reusing, MD  Insulin Syringes, Disposable, U-100 0.3 ML MISC 10 Units by Does not apply route 2 (two) times daily with breakfast and lunch. 02/03/17   Langston Reusing, MD  metFORMIN (GLUCOPHAGE-XR) 500 MG 24 hr tablet Take 500 mg by mouth once daily x1 week, then increase to 500 mg twice daily x1 week, then 1,000 mg in the AM and 500 mg in the PM x1 week, and finally 1,000 mg twice daily. Take with food. Patient taking differently: Take 1,000 mg by mouth 2 (two) times daily. 05/29/22   Gardenia Phlegm, MD  Naphazoline-Pheniramine (VISINE OP) Place 1 drop into both eyes daily as needed (blurry eyes).    [provider]  olmesartan (BENICAR) 20 MG tablet Take 1 tablet (20  mg total) by mouth daily. 06/29/22 09/27/22  Billie Lade, MD  Semaglutide,0.25 or 0.5MG /DOS, (OZEMPIC, 0.25 OR 0.5 MG/DOSE,) 2 MG/3ML SOPN Inject 0.25 mg into the skin once a week. Patient not taking: Reported on 07/20/2022 06/29/22   Billie Lade, MD      Allergies    Penicillins    Review of Systems   Review of Systems  Physical Exam Updated Vital Signs BP 115/65 (BP Location: Right Arm)   Pulse 78   Temp 98.2 F (36.8 C) (Oral)   Resp 18   Ht 5\' 11"  (1.803 m)   Wt 94.3 kg   SpO2 100%   BMI 29.01 kg/m  Physical Exam Vitals and nursing note reviewed.  Constitutional:      General: He is not in acute distress.    Appearance: He is  well-developed.  HENT:     Head: Normocephalic and atraumatic.  Eyes:     Conjunctiva/sclera: Conjunctivae normal.  Cardiovascular:     Rate and Rhythm: Normal rate and regular rhythm.     Heart sounds: No murmur heard. Pulmonary:     Effort: Pulmonary effort is normal. No respiratory distress.     Breath sounds: Normal breath sounds.  Abdominal:     Palpations: Abdomen is soft.     Tenderness: There is no abdominal tenderness.  Musculoskeletal:        General: No swelling.     Cervical back: Neck supple.     Comments: Wound to lateral aspect of left foot as noted in picture.  Skin:    General: Skin is warm and dry.     Capillary Refill: Capillary refill takes less than 2 seconds.  Neurological:     General: No focal deficit present.     Mental Status: He is alert and oriented to person, place, and time.  Psychiatric:        Mood and Affect: Mood normal.     ED Results / Procedures / Treatments   Labs (all labs ordered are listed, but only abnormal results are displayed) Labs Reviewed  CBC WITH DIFFERENTIAL/PLATELET  BASIC METABOLIC PANEL  SEDIMENTATION RATE  C-REACTIVE PROTEIN    EKG None  Radiology No results found.  Procedures Procedures    Medications Ordered in ED Medications - No data to display  ED Course/ Medical Decision Making/ A&P                             Medical Decision Making Differential diagnosis: Osteomyelitis, cellulitis, limb ischemia, other ED course: Patient presents for left foot wound that has been ongoing for about 6 weeks, has known peripheral vascular disease, he states he came in today because his son said "what they had gone to the bone.  He recently was seen by vascular surgery and has angiogram scheduled for June 13.  Denies any fevers or chills.  Amount and/or Complexity of Data Reviewed Labs: ordered. Radiology: ordered.   Is pending, signed out to Pauline Aus, [place]        Final Clinical Impression(s) / ED  Diagnoses Final diagnoses:  None    Rx / DC Orders ED Discharge Orders     None         Ma Rings, PA-C 07/30/22 1857    Josem Kaufmann 07/30/22 1901    Bethann Berkshire, MD 08/01/22 1147

## 2022-07-31 ENCOUNTER — Encounter (HOSPITAL_COMMUNITY)
Admission: EM | Disposition: A | Discharge status: Home / Self Care | Payer: Self-pay | Source: Home / Self Care | Attending: Internal Medicine

## 2022-07-31 ENCOUNTER — Inpatient Hospital Stay (HOSPITAL_COMMUNITY): Payer: 59

## 2022-07-31 DIAGNOSIS — I70249 Atherosclerosis of native arteries of left leg with ulceration of unspecified site: Secondary | ICD-10-CM

## 2022-07-31 DIAGNOSIS — L97521 Non-pressure chronic ulcer of other part of left foot limited to breakdown of skin: Secondary | ICD-10-CM | POA: Diagnosis not present

## 2022-07-31 DIAGNOSIS — L98499 Non-pressure chronic ulcer of skin of other sites with unspecified severity: Secondary | ICD-10-CM

## 2022-07-31 HISTORY — PX: ABDOMINAL AORTOGRAM W/LOWER EXTREMITY: CATH118223

## 2022-07-31 LAB — CBC
HCT: 36.6 % — ABNORMAL LOW (ref 39.0–52.0)
Hemoglobin: 12.2 g/dL — ABNORMAL LOW (ref 13.0–17.0)
MCH: 27.5 pg (ref 26.0–34.0)
MCHC: 33.3 g/dL (ref 30.0–36.0)
MCV: 82.6 fL (ref 80.0–100.0)
Platelets: 397 10*3/uL (ref 150–400)
RBC: 4.43 MIL/uL (ref 4.22–5.81)
RDW: 13.2 % (ref 11.5–15.5)
WBC: 17.5 10*3/uL — ABNORMAL HIGH (ref 4.0–10.5)
nRBC: 0 % (ref 0.0–0.2)

## 2022-07-31 LAB — GLUCOSE, CAPILLARY
Glucose-Capillary: 189 mg/dL — ABNORMAL HIGH (ref 70–99)
Glucose-Capillary: 231 mg/dL — ABNORMAL HIGH (ref 70–99)
Glucose-Capillary: 74 mg/dL (ref 70–99)
Glucose-Capillary: 86 mg/dL (ref 70–99)
Glucose-Capillary: 92 mg/dL (ref 70–99)
Glucose-Capillary: 95 mg/dL (ref 70–99)

## 2022-07-31 LAB — BASIC METABOLIC PANEL
Anion gap: 10 (ref 5–15)
BUN: 20 mg/dL (ref 8–23)
CO2: 22 mmol/L (ref 22–32)
Calcium: 9.2 mg/dL (ref 8.9–10.3)
Chloride: 102 mmol/L (ref 98–111)
Creatinine, Ser: 0.78 mg/dL (ref 0.61–1.24)
GFR, Estimated: >60 mL/min (ref >60)
Glucose, Bld: 103 mg/dL — ABNORMAL HIGH (ref 70–99)
Potassium: 3.7 mmol/L (ref 3.5–5.1)
Sodium: 134 mmol/L — ABNORMAL LOW (ref 135–145)

## 2022-07-31 LAB — C-REACTIVE PROTEIN: CRP: 12.4 mg/dL — ABNORMAL HIGH (ref <1.0)

## 2022-07-31 LAB — HIV ANTIBODY (ROUTINE TESTING W REFLEX): HIV Screen 4th Generation wRfx: NONREACTIVE

## 2022-07-31 SURGERY — ABDOMINAL AORTOGRAM W/LOWER EXTREMITY
Anesthesia: LOCAL

## 2022-07-31 MED ORDER — SODIUM CHLORIDE 0.9 % IV SOLN: INTRAVENOUS | Status: DC

## 2022-07-31 MED ORDER — IODIXANOL 320 MG/ML IV SOLN
INTRAVENOUS | Status: DC | PRN
  Administered 2022-07-31: 65 mL via INTRAVENOUS

## 2022-07-31 MED ORDER — ASPIRIN 81 MG PO TBEC
81.0000 mg | DELAYED_RELEASE_TABLET | Freq: Every day | ORAL | Status: DC
  Administered 2022-07-31 – 2022-08-04 (×5): 81 mg via ORAL
  Filled 2022-07-31 (×5): qty 1

## 2022-07-31 MED ORDER — ACETAMINOPHEN 325 MG PO TABS
650.0000 mg | ORAL_TABLET | Freq: Four times a day (QID) | ORAL | Status: DC | PRN
  Administered 2022-07-31: 650 mg via ORAL
  Filled 2022-07-31: qty 2

## 2022-07-31 MED ORDER — HEPARIN (PORCINE) IN NACL 1000-0.9 UT/500ML-% IV SOLN
INTRAVENOUS | Status: DC | PRN
  Administered 2022-07-31 (×2): 500 mL

## 2022-07-31 MED ORDER — ORAL CARE MOUTH RINSE: 15.0000 mL | OROMUCOSAL | Status: DC | PRN

## 2022-07-31 MED ORDER — HYDROCODONE-ACETAMINOPHEN 5-325 MG PO TABS
1.0000 | ORAL_TABLET | Freq: Three times a day (TID) | ORAL | Status: DC | PRN
  Administered 2022-07-31 – 2022-08-03 (×8): 1 via ORAL
  Filled 2022-07-31 (×9): qty 1

## 2022-07-31 MED ORDER — FENTANYL CITRATE (PF) 100 MCG/2ML IJ SOLN
INTRAMUSCULAR | Status: DC | PRN
  Administered 2022-07-31: 50 ug via INTRAVENOUS

## 2022-07-31 MED ORDER — VANCOMYCIN HCL IN DEXTROSE 1-5 GM/200ML-% IV SOLN
1000.0000 mg | Freq: Two times a day (BID) | INTRAVENOUS | Status: DC
  Administered 2022-07-31 – 2022-08-04 (×9): 1000 mg via INTRAVENOUS
  Filled 2022-07-31 (×11): qty 200

## 2022-07-31 MED ORDER — IRBESARTAN 150 MG PO TABS
150.0000 mg | ORAL_TABLET | Freq: Every day | ORAL | Status: DC
  Administered 2022-07-31 – 2022-08-04 (×5): 150 mg via ORAL
  Filled 2022-07-31 (×5): qty 1

## 2022-07-31 MED ORDER — ONDANSETRON HCL 4 MG PO TABS: 4.0000 mg | ORAL_TABLET | Freq: Four times a day (QID) | ORAL | Status: DC | PRN

## 2022-07-31 MED ORDER — HEPARIN SODIUM (PORCINE) 5000 UNIT/ML IJ SOLN
5000.0000 [IU] | Freq: Three times a day (TID) | INTRAMUSCULAR | Status: DC
  Administered 2022-07-31 – 2022-08-04 (×12): 5000 [IU] via SUBCUTANEOUS
  Filled 2022-07-31 (×13): qty 1

## 2022-07-31 MED ORDER — MIDAZOLAM HCL 2 MG/2ML IJ SOLN
INTRAMUSCULAR | Status: DC | PRN
  Administered 2022-07-31: 1 mg via INTRAVENOUS

## 2022-07-31 MED ORDER — ACETAMINOPHEN 650 MG RE SUPP: 650.0000 mg | Freq: Four times a day (QID) | RECTAL | Status: DC | PRN

## 2022-07-31 MED ORDER — GABAPENTIN 300 MG PO CAPS
600.0000 mg | ORAL_CAPSULE | Freq: Every day | ORAL | Status: DC
  Administered 2022-07-31 – 2022-08-04 (×5): 600 mg via ORAL
  Filled 2022-07-31 (×5): qty 2

## 2022-07-31 MED ORDER — LIDOCAINE HCL (PF) 1 % IJ SOLN
INTRAMUSCULAR | Status: AC
  Filled 2022-07-31: qty 30

## 2022-07-31 MED ORDER — FENTANYL CITRATE (PF) 100 MCG/2ML IJ SOLN
INTRAMUSCULAR | Status: AC
  Filled 2022-07-31: qty 2

## 2022-07-31 MED ORDER — ONDANSETRON HCL 4 MG/2ML IJ SOLN: 4.0000 mg | Freq: Four times a day (QID) | INTRAMUSCULAR | Status: DC | PRN

## 2022-07-31 MED ORDER — INSULIN ASPART 100 UNIT/ML IJ SOLN
0.0000 [IU] | Freq: Four times a day (QID) | INTRAMUSCULAR | Status: DC
  Administered 2022-08-01 (×2): 3 [IU] via SUBCUTANEOUS
  Administered 2022-08-01: 5 [IU] via SUBCUTANEOUS
  Administered 2022-08-02 (×2): 3 [IU] via SUBCUTANEOUS
  Administered 2022-08-02: 2 [IU] via SUBCUTANEOUS
  Administered 2022-08-03 (×2): 3 [IU] via SUBCUTANEOUS

## 2022-07-31 MED ORDER — POLYETHYLENE GLYCOL 3350 17 G PO PACK: 17.0000 g | PACK | Freq: Every day | ORAL | Status: DC | PRN

## 2022-07-31 MED ORDER — MIDAZOLAM HCL 2 MG/2ML IJ SOLN
INTRAMUSCULAR | Status: AC
  Filled 2022-07-31: qty 2

## 2022-07-31 MED ORDER — SODIUM CHLORIDE 0.9 % IV SOLN
2.0000 g | Freq: Three times a day (TID) | INTRAVENOUS | Status: DC
  Administered 2022-07-31 – 2022-08-04 (×13): 2 g via INTRAVENOUS
  Filled 2022-07-31 (×13): qty 12.5

## 2022-07-31 MED ORDER — LIDOCAINE HCL (PF) 1 % IJ SOLN
INTRAMUSCULAR | Status: DC | PRN
  Administered 2022-07-31: 20 mL via INTRADERMAL

## 2022-07-31 MED ORDER — ATORVASTATIN CALCIUM 40 MG PO TABS
40.0000 mg | ORAL_TABLET | Freq: Every day | ORAL | Status: DC
  Administered 2022-07-31 – 2022-08-04 (×5): 40 mg via ORAL
  Filled 2022-07-31 (×5): qty 1

## 2022-07-31 MED ORDER — SODIUM CHLORIDE 0.9 % IV SOLN: INTRAVENOUS | Status: AC

## 2022-07-31 SURGICAL SUPPLY — 11 items
CATH OMNI FLUSH 5F 65CM (CATHETERS) IMPLANT
CATH TEMPO AQUA 5F 100CM (CATHETERS) IMPLANT
KIT MICROPUNCTURE NIT STIFF (SHEATH) IMPLANT
KIT PV (KITS) ×1 IMPLANT
SHEATH PINNACLE 5F 10CM (SHEATH) IMPLANT
SYR MEDRAD MARK 7 150ML (SYRINGE) ×1 IMPLANT
TRANSDUCER W/STOPCOCK (MISCELLANEOUS) ×1 IMPLANT
TRAY PV CATH (CUSTOM PROCEDURE TRAY) ×1 IMPLANT
TUBING CIL FLEX 10 FLL-RA (TUBING) IMPLANT
WIRE HI TORQ VERSACORE 300 (WIRE) IMPLANT
WIRE STARTER BENTSON 035X150 (WIRE) IMPLANT

## 2022-07-31 NOTE — Progress Notes (Signed)
Bilateral lower extremity vein mapping study completed.  Please see CV Procedures for preliminary results.  Kamaree Berkel, RVT  1:30 PM 07/31/22

## 2022-07-31 NOTE — Progress Notes (Signed)
Pharmacy Antibiotic Note  Grant Yang is a 67 y.o. male admitted on 07/30/2022 with osteomyelitis of the left lower extremity .  Pharmacy has been consulted for vancomycin and cefepime dosing.  Plan: Vancomycin 1000mg  IV q12 (eAUC 478) Cefepime 2G IV q8   Height: 5\' 11"  (180.3 cm) Weight: 94.3 kg (208 lb) IBW/kg (Calculated) : 75.3  Temp (24hrs), Avg:98.3 F (36.8 C), Min:98.2 F (36.8 C), Max:98.3 F (36.8 C)  Recent Labs  Lab 07/26/22 0808 07/30/22 1845  WBC  --  20.0*  CREATININE 1.30* 1.15    Estimated Creatinine Clearance: 76.1 mL/min (by C-G formula based on SCr of 1.15 mg/dL).    Allergies  Allergen Reactions   Penicillins Other (See Comments)    Unknown childhood allergy    Thank you for allowing pharmacy to be a part of this patient's care.  Toniann Fail Collin Rengel 07/31/2022 1:55 AM

## 2022-07-31 NOTE — Progress Notes (Signed)
PROGRESS NOTE    Grant Yang  OZD:[id number] DOB: 1955-06-28 DOA: 07/30/2022 PCP: Billie Lade, MD   Brief Narrative:    Grant Yang is a 67 y.o. male with medical history significant for osteomyelitis of the left lower extremity, diabetes mellitus, COPD, hypertension, CVA, prostate cancer. Patient reports chronic non healing ulcer to his left lower extremity over the past several weeks, but reports over the past few days, and has appeared worse, with foul smell.  Patient has been seen by vascular this a.m. with plans for angiogram and further evaluation with Dr. Lajoyce Corners.  Assessment & Plan:   Principal Problem:   Foot ulcer, left (HCC) Active Problems:   Hypertension   Uncontrolled type 2 diabetes mellitus with hyperglycemia (HCC)   COPD suggested by initial evaluation (HCC)   History of CVA (cerebrovascular accident)  Assessment and Plan:  Foot ulcer, left (HCC) Chronic nonhealing left foot ulcer, appears infected.  Leukocytosis of 20.  Rules out for sepsis.  X-ray without evidence of osteomyelitis, ESR elevated at 86.  Recent ABIs 06/2022-normal ankle-brachial index, abnormal toe brachial index.  Patient previously had aortogram planned for 08/03/2022.  -Vascular surgery planning for angiogram today and eventual evaluation with Dr. Lajoyce Corners - Follow-up CRP -continue broad-spectrum antibiotics IV vancomycin and cefepime -Continue n.p.o. status.   History of CVA (cerebrovascular accident) Stable. -Resume aspirin, statins   Uncontrolled type 2 diabetes mellitus with hyperglycemia (HCC) Improved control this a.m. - HgbA1c- 14.3 - SSI- M -Hold home 70/30 10 units twice daily for now     Hypertension Stable. -Resume olmesartan    DVT prophylaxis:Heparin Code Status: Full Family Communication: None at bedside Disposition Plan:  Status is: Inpatient Remains inpatient appropriate because: Need for IV medications and inpatient procedures.   Consultants:  Vascular  Surgery  Procedures:  None  Antimicrobials:  Anti-infectives (From admission, onward)    Start     Dose/Rate Route Frequency Ordered Stop   07/31/22 0600  vancomycin (VANCOCIN) IVPB 1000 mg/200 mL premix        1,000 mg 200 mL/hr over 60 Minutes Intravenous Every 12 hours 07/31/22 0200     07/31/22 0600  ceFEPIme (MAXIPIME) 2 g in sodium chloride 0.9 % 100 mL IVPB        2 g 200 mL/hr over 30 Minutes Intravenous Every 8 hours 07/31/22 0200     07/30/22 2130  vancomycin (VANCOCIN) IVPB 1000 mg/200 mL premix        1,000 mg 200 mL/hr over 60 Minutes Intravenous  Once 07/30/22 2126 07/30/22 2328   07/30/22 2130  ceFEPIme (MAXIPIME) 2 g in sodium chloride 0.9 % 100 mL IVPB        2 g 200 mL/hr over 30 Minutes Intravenous  Once 07/30/22 2126 07/30/22 2225      Subjective: Patient seen and evaluated today with no new acute complaints or concerns. No acute concerns or events noted overnight.  He continues to have pain to his left foot.  Objective: Vitals:   07/30/22 2230 07/30/22 2334 07/31/22 0432 07/31/22 0815  BP: 122/71 (!) 153/81 (!) 106/59 128/75  Pulse: 70 85 66 66  Resp: 15 17 17 18   Temp:  98.3 F (36.8 C) 98.6 F (37 C) 97.8 F (36.6 C)  TempSrc:      SpO2: 98% 100% 98% 99%  Weight:      Height:        Intake/Output Summary (Last 24 hours) at 07/31/2022 0933 Last data filed at 07/30/2022  2225 Gross per 24 hour  Intake 100 ml  Output --  Net 100 ml   Filed Weights   07/30/22 1800  Weight: 94.3 kg    Examination:  General exam: Appears calm and comfortable  Respiratory system: Clear to auscultation. Respiratory effort normal. Cardiovascular system: S1 & S2 heard, RRR.  Gastrointestinal system: Abdomen is soft Central nervous system: Alert and awake Extremities: Left foot wounds with pictures in chart Skin: No significant lesions noted Psychiatry: Flat affect.    Data Reviewed: I have personally reviewed following labs and imaging  studies  CBC: Recent Labs  Lab 07/26/22 0808 07/30/22 1845 07/31/22 0637  WBC  --  20.0* 17.5*  NEUTROABS  --  15.4*  --   HGB 14.6 13.3 12.2*  HCT 43.0 39.8 36.6*  MCV  --  82.2 82.6  PLT  --  470* 397   Basic Metabolic Panel: Recent Labs  Lab 07/26/22 0808 07/30/22 1845 07/31/22 0637  NA 137 135 134*  K 4.3 3.8 3.7  CL 101 98 102  CO2  --  27 22  GLUCOSE 190* 95 103*  BUN 30* 33* 20  CREATININE 1.30* 1.15 0.78  CALCIUM  --  9.7 9.2   GFR: Estimated Creatinine Clearance: 109.4 mL/min (by C-G formula based on SCr of 0.78 mg/dL). Liver Function Tests: No results for input(s): "AST", "ALT", "ALKPHOS", "BILITOT", "PROT", "ALBUMIN" in the last 168 hours. No results for input(s): "LIPASE", "AMYLASE" in the last 168 hours. No results for input(s): "AMMONIA" in the last 168 hours. Coagulation Profile: No results for input(s): "INR", "PROTIME" in the last 168 hours. Cardiac Enzymes: No results for input(s): "CKTOTAL", "CKMB", "CKMBINDEX", "TROPONINI" in the last 168 hours. BNP (last 3 results) No results for input(s): "PROBNP" in the last 8760 hours. HbA1C: No results for input(s): "HGBA1C" in the last 72 hours. CBG: Recent Labs  Lab 07/30/22 2335 07/31/22 0501 07/31/22 0813  GLUCAP 86 74 95   Lipid Profile: No results for input(s): "CHOL", "HDL", "LDLCALC", "TRIG", "CHOLHDL", "LDLDIRECT" in the last 72 hours. Thyroid Function Tests: No results for input(s): "TSH", "T4TOTAL", "FREET4", "T3FREE", "THYROIDAB" in the last 72 hours. Anemia Panel: No results for input(s): "VITAMINB12", "FOLATE", "FERRITIN", "TIBC", "IRON", "RETICCTPCT" in the last 72 hours. Sepsis Labs: No results for input(s): "PROCALCITON", "LATICACIDVEN" in the last 168 hours.  No results found for this or any previous visit (from the past 240 hour(s)).       Radiology Studies: DG Foot Complete Left  Result Date: 07/30/2022 CLINICAL DATA:  Lateral left foot wound. 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 A very small nondisplaced fracture deformity is seen involving the proximal tip of the base of the fifth left metatarsal. This represents a new finding when compared to the prior study. There is no evidence of dislocation. A 3.8 cm x 4.5 cm soft tissue defect is seen along the lateral aspect of the mid left foot. This is adjacent to the proximal aspect of the fifth left metatarsal. No adjacent cortical destruction is identified. IMPRESSION: 1. Soft tissue defect along the lateral aspect of the mid left foot, as described above, without evidence of acute osteomyelitis. 2. Very small nondisplaced fracture deformity involving the proximal tip of the base of the fifth left metatarsal. 3. 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 Electronically Signed   By: Demetrius Revel.D.  On: 07/30/2022 19:25        Scheduled Meds:  aspirin EC  81 mg Oral Daily   atorvastatin  40 mg Oral Daily   gabapentin  600 mg Oral Q1200   heparin  5,000 Units Subcutaneous Q8H   insulin aspart  0-15 Units Subcutaneous Q6H   irbesartan  150 mg Oral Daily   Continuous Infusions:  ceFEPime (MAXIPIME) IV 2 g (07/31/22 0658)   vancomycin 1,000 mg (07/31/22 0525)     LOS: 1 day    Time spent: 35 minutes    Wilfred Siverson D Sherryll Burger, DO [facility]  If 7PM-7AM, please contact night-coverage [email or web address] 07/31/2022, 9:33 AM

## 2022-07-31 NOTE — Consult Note (Addendum)
Hospital Consult    Reason for Consult:  Left foot wound Requesting Physician:  Dr. Sherryll Burger MRN #:  [id number]  History of Present Illness: This is a 67 y.o. male with pertinent medical history including DM, HLD, CVA, GERD, Hx of Prostate Cancer, and HTN who presented to [place] with worsening left foot wound. He is known to [facility] having recently been seen by Dr. Karin Lieu at request by Dr. Lajoyce Corners for left foot wound. 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 Lajoyce Corners on 05/31/22 due diabetic toe infection with osteomyelitis. This has since healed well. He explains that he started to develop a wound on the lateral aspect of his foot about 2 weeks after his toe amputation. He reports no injury to the foot. He does have diabetic neuropathy so he says he does get tingling and pain in his feet especially at night. He denies any pain in his legs on ambulation or rest. He lives alone and does a fair amount of walking. He is no longer a smoke, quit 14 years ago. When he was recently seen in our office on 5/22 he had ABI of 1.42 on the right, and 1.07 on the left. Dampened toe pressure on left of 43 mmHg. He was initially scheduled to undergo Angiogram on 08/03/22 prior to presenting to [facility] yesterday.  Past Medical History:  Diagnosis Date   Cancer [facility])    prostate cancer    Diabetes mellitus    GERD (gastroesophageal reflux disease)    History of kidney stones    Hypertension    Neuropathy in diabetes [facility])    bilateral lower extremities    Shortness of breath    patient states it hurts to breath; this was when he use to smoke cigarettes    Stroke [facility]) 2010   "mini stroke" patient cant remember when; states it happened in w. [place] in 2010 . denies any residual deficits     Past Surgical History:  Procedure Laterality Date   AMPUTATION Left 05/31/2022   Procedure: LEFT 2ND TOE AMPUTATION;  Surgeon: Nadara Mustard, MD;  Location: [facility] OR;  Service: Orthopedics;  Laterality: Left;   APPENDECTOMY      CHOLECYSTECTOMY  12/30/2010   Procedure: LAPAROSCOPIC CHOLECYSTECTOMY;  Surgeon: Dalia Heading;  Location: [facility] ORS;  Service: General;  Laterality: N/A;   I & D EXTREMITY Left 05/31/2022   Procedure: LEFT FOOT DEBRIDEMENT;  Surgeon: Nadara Mustard, MD;  Location: [facility] OR;  Service: Orthopedics;  Laterality: Left;   INCISION AND DRAINAGE ABSCESS Left 02/01/2017   Procedure: INCISION AND DRAINAGE ABSCESS LEFT HAND;  Surgeon: Vickki Hearing, MD;  Location: [facility] ORS;  Service: Orthopedics;  Laterality: Left;   LYMPHADENECTOMY Bilateral 08/02/2017   Procedure: LYMPHADENECTOMY, PELVIC;  Surgeon: Heloise Purpura, MD;  Location: [facility] ORS;  Service: Urology;  Laterality: Bilateral;   ROBOT ASSISTED LAPAROSCOPIC RADICAL PROSTATECTOMY N/A 08/02/2017   Procedure: XI ROBOTIC ASSISTED LAPAROSCOPIC RADICAL PROSTATECTOMY LEVEL 2;  Surgeon: Heloise Purpura, MD;  Location: [facility] ORS;  Service: Urology;  Laterality: N/A;    Allergies  Allergen Reactions   Penicillins Other (See Comments)    Unknown childhood allergy    Prior to Admission medications   Medication Sig Start Date End Date Taking? Authorizing Provider  albuterol (VENTOLIN HFA) 108 (90 Base) MCG/ACT inhaler Inhale 2 puffs into the lungs every 6 (six) hours as needed for wheezing or shortness of breath. 05/29/22   Gardenia Phlegm, MD  amLODipine (NORVASC) 10 MG  tablet Take 1 tablet (10 mg total) by mouth daily. Patient not taking: Reported on 07/20/2022 06/03/22 07/20/22  Lanae Boast, MD  aspirin EC 81 MG tablet Take 1 tablet (81 mg total) by mouth daily. Swallow whole. 06/29/22   Billie Lade, MD  atorvastatin (LIPITOR) 40 MG tablet Take 1 tablet (40 mg total) by mouth daily. 06/29/22   Billie Lade, MD  blood glucose meter kit and supplies Dispense based on patient and insurance preference. Use up to four times daily as directed. (FOR ICD-10 E10.9, E11.9). 02/03/17   Langston Reusing, MD  Continuous Blood Gluc Sensor (DEXCOM G7 SENSOR) MISC 1 Units by  Does not apply route See admin instructions. Change sensor after 10 days 06/05/22   Gardenia Phlegm, MD  EPINEPHrine 0.3 mg/0.3 mL IJ SOAJ injection Inject 0.3 mg into the muscle once as needed for up to 1 dose (if patient exhibits significant signs and symptoms of allergic reaction.). 06/02/22   Lanae Boast, MD  gabapentin (NEURONTIN) 300 MG capsule Take 1 capsule (300 mg total) by mouth 3 (three) times daily as needed. Patient taking differently: Take 600 mg by mouth daily at 12 noon. 05/29/22   Gardenia Phlegm, MD  insulin NPH-regular Human (NOVOLIN 70/30) (70-30) 100 UNIT/ML injection Inject 10 Units into the skin 2 (two) times daily with a meal. Patient taking differently: Inject 15 Units into the skin 2 (two) times daily with a meal. With breakfast and lunch 02/03/17   Langston Reusing, MD  Insulin Syringes, Disposable, U-100 0.3 ML MISC 10 Units by Does not apply route 2 (two) times daily with breakfast and lunch. 02/03/17   Langston Reusing, MD  metFORMIN (GLUCOPHAGE-XR) 500 MG 24 hr tablet Take 500 mg by mouth once daily x1 week, then increase to 500 mg twice daily x1 week, then 1,000 mg in the AM and 500 mg in the PM x1 week, and finally 1,000 mg twice daily. Take with food. Patient taking differently: Take 1,000 mg by mouth 2 (two) times daily. 05/29/22   Gardenia Phlegm, MD  Naphazoline-Pheniramine (VISINE OP) Place 1 drop into both eyes daily as needed (blurry eyes).    [provider]  olmesartan (BENICAR) 20 MG tablet Take 1 tablet (20 mg total) by mouth daily. 06/29/22 09/27/22  Billie Lade, MD  Semaglutide,0.25 or 0.5MG /DOS, (OZEMPIC, 0.25 OR 0.5 MG/DOSE,) 2 MG/3ML SOPN Inject 0.25 mg into the skin once a week. Patient not taking: Reported on 07/20/2022 06/29/22   Billie Lade, MD    Social History   Socioeconomic History   Marital status: Divorced    Spouse name: Not on file   Number of children: Not on file   Years of education: Not on file   Highest education level:  Not on file  Occupational History   Not on file  Tobacco Use   Smoking status: Former    Packs/day: 2.00    Years: 38.00    Additional pack years: 0.00    Total pack years: 76.00    Types: Cigarettes    Start date: 1    Quit date: 2010    Years since quitting: 14.4   Smokeless tobacco: Never  Substance and Sexual Activity   Alcohol use: No   Drug use: Yes    Types: Marijuana    Comment: occ   Sexual activity: Yes  Other Topics Concern   Not on file  Social History Narrative   Not on file  Social Determinants of Health   Financial Resource Strain: Not on file  Food Insecurity: No Food Insecurity (07/30/2022)   Hunger Vital Sign    Worried About Running Out of Food in the Last Year: Never true    Ran Out of Food in the Last Year: Never true  Transportation Needs: No Transportation Needs (07/30/2022)   PRAPARE - Administrator, Civil Service (Medical): No    Lack of Transportation (Non-Medical): No  Physical Activity: Not on file  Stress: Not on file  Social Connections: Not on file  Intimate Partner Violence: Not At Risk (07/30/2022)   Humiliation, Afraid, Rape, and Kick questionnaire    Fear of Current or Ex-Partner: No    Emotionally Abused: No    Physically Abused: No    Sexually Abused: No     Family History  Problem Relation Age of Onset   Heart failure Mother    Cancer Father     ROS: Otherwise negative unless mentioned in HPI  Physical Examination  Vitals:   07/30/22 2334 07/31/22 0432  BP: (!) 153/81 (!) 106/59  Pulse: 85 66  Resp: 17 17  Temp: 98.3 F (36.8 C) 98.6 F (37 C)  SpO2: 100% 98%   Body mass index is 29.01 kg/m.  General:  WDWN in NAD Gait: Not observed HENT: WNL, normocephalic Pulmonary: normal non-labored breathing, without  wheezing Cardiac: regular Abdomen:  soft Vascular Exam/Pulses: 2+ femoral pulses bilaterally, 2+ PT pulses bilaterally. No palpable DP bilaterally. Feet warm and well perfused Extremities:  with ischemic changes of lateral aspect of left foot, without Gangrene , without cellulitis; left 2nd toe amputation site well healed, with open wounds along lateral aspect of left foot as shown below. He also has small eschar on medial aspect of left great toe  Musculoskeletal: no muscle wasting or atrophy  Neurologic: A&O X 3;  No focal weakness or paresthesias are detected; speech is fluent/normal Psychiatric:  The pt has Normal affect.  CBC    Component Value Date/Time   WBC 17.5 (H) 07/31/2022 0637   RBC 4.43 07/31/2022 0637   HGB 12.2 (L) 07/31/2022 0637   HCT 36.6 (L) 07/31/2022 0637   PLT 397 07/31/2022 0637   MCV 82.6 07/31/2022 0637   MCH 27.5 07/31/2022 0637   MCHC 33.3 07/31/2022 0637   RDW 13.2 07/31/2022 0637   LYMPHSABS 2.3 07/30/2022 1845   MONOABS 1.5 (H) 07/30/2022 1845   EOSABS 0.5 07/30/2022 1845   BASOSABS 0.1 07/30/2022 1845    BMET    Component Value Date/Time   NA 135 07/30/2022 1845   K 3.8 07/30/2022 1845   CL 98 07/30/2022 1845   CO2 27 07/30/2022 1845   GLUCOSE 95 07/30/2022 1845   BUN 33 (H) 07/30/2022 1845   CREATININE 1.15 07/30/2022 1845   CALCIUM 9.7 07/30/2022 1845   GFRNONAA >60 07/30/2022 1845   GFRAA >60 07/27/2017 1504    COAGS: No results found for: "INR", "PROTIME"   Non-Invasive Vascular Imaging:   +-------+-----------+-----------+------------+------------+  ABI/TBIToday's ABIToday's TBIPrevious ABIPrevious TBI  +-------+-----------+-----------+------------+------------+  Right 1.42       0.67                                 +-------+-----------+-----------+------------+------------+  Left  1.07       0.38                                 +-------+-----------+-----------+------------+------------+  Statin:  Yes.   Beta Blocker:  No. Aspirin:  Yes.   ACEI:  No. ARB:  Yes.   CCB use:  Yes Other antiplatelets/anticoagulants:  No.    ASSESSMENT/PLAN: This is a 67 y.o. male who presents to [facility]  with worsening left foot wound over past 2 months. He initially was scheduled to have Angiogram on 08/03/22 but since he presented with worsening wounds he was transferred to [facility] and will undergo Angiogram today with Dr. Lenell Antu. - I discussed risks/ benefits of procedure including bleeding, arterial injury or dissection, thrombosis, or need for surgical intervention and he is agreeable to proceed. His questions regarding the procedure were answered.  - We will plan to ask Dr. Lajoyce Corners to see patient while he is here as well since has previously been under his care for his foot wounds - Continue Aspirin and Statin - Please keep NPO - Consent order has been placed   [facility] [phone] 07/31/2022  7:46 AM  VASCULAR STAFF ADDENDUM: I have independently interviewed and examined the patient. I agree with the above.  Plan angiography with possible intervention today in cath lab.  Rande Brunt. Lenell Antu, MD [facility] Phone Number: [phone] 07/31/2022 10:34 AM

## 2022-07-31 NOTE — Op Note (Signed)
DATE OF SERVICE: 07/31/2022  PATIENT:  Grant Yang  67 y.o. male  PRE-OPERATIVE DIAGNOSIS:  Atherosclerosis of native arteries of left lower extremity causing ulceration  POST-OPERATIVE DIAGNOSIS:  Same  PROCEDURE:   1) Ultrasound guided right common femoral artery access 2) Aortogram 3) Left lower extremity angiogram with second order cannulation  4) Additional left lower extremity angiogram with third order cannulation 5) Conscious sedation (23 minutes)  SURGEON:  Rande Brunt. Lenell Antu, MD  ASSISTANT: none  ANESTHESIA:   local and IV sedation  ESTIMATED BLOOD LOSS: minimal  LOCAL MEDICATIONS USED:  LIDOCAINE   COUNTS: confirmed correct.  PATIENT DISPOSITION:  PACU - hemodynamically stable.   Delay start of Pharmacological VTE agent (>24hrs) due to surgical blood loss or risk of bleeding: no  INDICATION FOR PROCEDURE: MAKAIH TATGE is a 67 y.o. male with left foot ischemic ulceration. After careful discussion of risks, benefits, and alternatives the patient was offered angiogram. The patient  understood and wished to proceed.  OPERATIVE FINDINGS:  Terminal aorta and iliac arteries: Widely patent renal arteries Terminal aorta patent without stenosis Common, internal and external Iliac arteries patent bilaterally  Left lower extremity: Common femoral artery: patent  Profunda femoris artery: patent  Superficial femoral artery: patent Popliteal artery: patent Anterior tibial artery: patent at its origin, occludes proximal calf; does not reconstitute Tibioperoneal trunk: patent Peroneal artery: patent to the ankle where it arborizes Posterior tibial artery: occluded at its origin, large vessel reconstitutes about the ankle filling via peroneal and geniculate collaterals Pedal circulation: fills via PT; more robust than expected  GLASS score. FP 0. IP 2. P 0. Stage II.   WIfI score. 2 / 1 / 1. Stage III.  DESCRIPTION OF PROCEDURE: After identification of the patient  in the pre-operative holding area, the patient was transferred to the operating room. The patient was positioned supine on the operating room table. Anesthesia was induced. The groins was prepped and draped in standard fashion. A surgical pause was performed confirming correct patient, procedure, and operative location.  The right groin was anesthetized with subcutaneous injection of 1% lidocaine. Using ultrasound guidance, the right common femoral artery was accessed with micropuncture technique. Fluoroscopy was used to confirm cannulation over the femoral head. The 36F sheath was upsized to 27F.   A Benson wire was advanced into the distal aorta. Over the wire an omni flush catheter was advanced to the level of L2. Aortogram was performed - see above for details.   The left common iliac artery was selected with an omniflush catheter and guidewire. The wire was advanced into the common femoral artery. Over the wire the omni flush catheter was advanced into the external iliac artery. Selective angiography was performed - see above for details. I then advanced a versacore wire into the left popliteal artery and advanced a 100cm straight catheter into the popliteal artery. Angiography of the calf and foot was performed from this catheterization.   The sheath was left in place to be removed in the recovery area.   Conscious sedation was administered with the use of IV fentanyl and midazolam under continuous physician and nurse monitoring.  Heart rate, blood pressure, and oxygen saturation were continuously monitored.  Total sedation time was 23 minutes  Upon completion of the case instrument and sharps counts were confirmed correct. The patient was transferred to the PACU in good condition. I was present for all portions of the procedure.  PLAN: No endovascular solution. Is not healing with peroneal only  runoff. Check vein mapping. Left below knee popliteal - posterior tibial bypass would likely allow him  to heal.  Rande Brunt. Lenell Antu, MD [facility] Phone Number: [phone] 07/31/2022 11:31 AM

## 2022-08-01 DIAGNOSIS — L97521 Non-pressure chronic ulcer of other part of left foot limited to breakdown of skin: Secondary | ICD-10-CM | POA: Diagnosis not present

## 2022-08-01 LAB — BASIC METABOLIC PANEL
Anion gap: 8 (ref 5–15)
BUN: 17 mg/dL (ref 8–23)
CO2: 25 mmol/L (ref 22–32)
Calcium: 9.1 mg/dL (ref 8.9–10.3)
Chloride: 100 mmol/L (ref 98–111)
Creatinine, Ser: 0.84 mg/dL (ref 0.61–1.24)
GFR, Estimated: >60 mL/min (ref >60)
Glucose, Bld: 160 mg/dL — ABNORMAL HIGH (ref 70–99)
Potassium: 4.6 mmol/L (ref 3.5–5.1)
Sodium: 133 mmol/L — ABNORMAL LOW (ref 135–145)

## 2022-08-01 LAB — CBC
HCT: 36.9 % — ABNORMAL LOW (ref 39.0–52.0)
Hemoglobin: 12.1 g/dL — ABNORMAL LOW (ref 13.0–17.0)
MCH: 26.9 pg (ref 26.0–34.0)
MCHC: 32.8 g/dL (ref 30.0–36.0)
MCV: 82 fL (ref 80.0–100.0)
Platelets: 376 10*3/uL (ref 150–400)
RBC: 4.5 MIL/uL (ref 4.22–5.81)
RDW: 13.2 % (ref 11.5–15.5)
WBC: 16.1 10*3/uL — ABNORMAL HIGH (ref 4.0–10.5)
nRBC: 0 % (ref 0.0–0.2)

## 2022-08-01 LAB — MAGNESIUM: Magnesium: 1.8 mg/dL (ref 1.7–2.4)

## 2022-08-01 LAB — GLUCOSE, CAPILLARY
Glucose-Capillary: 148 mg/dL — ABNORMAL HIGH (ref 70–99)
Glucose-Capillary: 199 mg/dL — ABNORMAL HIGH (ref 70–99)
Glucose-Capillary: 195 mg/dL — ABNORMAL HIGH (ref 70–99)
Glucose-Capillary: 222 mg/dL — ABNORMAL HIGH (ref 70–99)

## 2022-08-01 NOTE — Consult Note (Signed)
[facility] service manager [facility]) Accountable Care Organization (ACO) [facility] Liaison Note  08/01/2022  Grant Yang 1956-01-04 [id number]  Location: [facility] Liaison met patient at bedside at [facility].  Insurance: Grant Yang is a 67 y.o. male who is a Primary Care Patient of [name], [name], MD ([facility]). The patient was screened for  readmission hospitalization with noted low risk score for unplanned readmission risk with 2 IP in 6 months.  The patient was assessed for potential [facility]) Care Management service needs for post hospital transition for care coordination. Review of patient's electronic medical record reveals patient was admitted with Left foot ulcer. Spoke with pt at bedside who indicates he may need additional intervention to his left foot. [facility] liaison educated on [facility] services and available benefits. Pt receptive to a follow up call if he is released home however pending ongoing evaluation and treatment there maybe a different discharge disposition. Patient was given an appointment reminder card.   Plan: [facility] Liaison will continue to follow progress and disposition to asess for post hospital community care coordination/management needs.  Referral request for community care coordination: pending disposition.   [facility] Care Management/Population Health does not replace or interfere with any arrangements made by the Inpatient Transition of Care team.   For questions contact:   Elliot Cousin, RN, BSN [facility] Liaison [place]   [facility]  Population Health Office Hours MTWF  8:00 am-6:00 pm Off on Thursday [phone] mobile [phone] [Office toll free line][facility] Office Hours are M-F 8:30 - 5 pm 24 hour nurse advise line [phone] Concierge  [email or web address]

## 2022-08-01 NOTE — Progress Notes (Signed)
PROGRESS NOTE  Grant Yang  ZOX:[id number] DOB: Jul 03, 1955 DOA: 07/30/2022 PCP: Billie Lade, MD   Brief Narrative: Patient is a 67 year old male with history of osteomyelitis of the left lower extremity, diabetes type 2, COPD, hypertension, CVA, pressure cancer who presented with complaint of chronic nonhealing left lower extremity ulcer which was appearing worse with foul smell.  Vascular surgery consulted and following.  Plan for left below-knee popliteal to posterior tibial bypass.  Assessment & Plan:  Principal Problem:   Foot ulcer, left (HCC) Active Problems:   Hypertension   Uncontrolled type 2 diabetes mellitus with hyperglycemia (HCC)   COPD suggested by initial evaluation (HCC)   History of CVA (cerebrovascular accident)  Left foot ulcer: Chronic nonhealing left foot ulcer, appears infected. He was previously following with Dr. Lajoyce Corners and underwent amputation of left second toe. He  presented with leukocytosis.  X-ray without evidence of osteomyelitis.  Elevated ESR.  Vascular surgery consulted.  Underwent angiogram.Plan for left below-knee popliteal to posterior tibial bypass.  Will check if he needs to be seen by orthopedics, Dr. Lajoyce Corners after vascular procedure.  Currently in broad-spectrum antibiotics with vancomycin and cefepime.  History of CVA/peripheral vascular disease: On aspirin, statin.  Uncontrolled diabetes type 2: Recent A1c of 14.3.  Continue current insulin regimen.  Monitor blood sugars  Hypertension: On olmesartan.  Blood pressure stable           DVT prophylaxis:heparin injection 5,000 Units Start: 07/31/22 0600 SCDs Start: 07/31/22 0301     Code Status: Full Code  Family Communication: None at bedside  Patient status:Inpatient  Patient is from :home  Anticipated discharge WJ:XBJY  Estimated DC date:after full workups, vascular surgery clearance/orthopedics evaluation   Consultants: Vascular surgery  Procedures:  Angiogram  Antimicrobials:  Anti-infectives (From admission, onward)    Start     Dose/Rate Route Frequency Ordered Stop   07/31/22 0600  vancomycin (VANCOCIN) IVPB 1000 mg/200 mL premix        1,000 mg 200 mL/hr over 60 Minutes Intravenous Every 12 hours 07/31/22 0200     07/31/22 0600  ceFEPIme (MAXIPIME) 2 g in sodium chloride 0.9 % 100 mL IVPB        2 g 200 mL/hr over 30 Minutes Intravenous Every 8 hours 07/31/22 0200     07/30/22 2130  vancomycin (VANCOCIN) IVPB 1000 mg/200 mL premix        1,000 mg 200 mL/hr over 60 Minutes Intravenous  Once 07/30/22 2126 07/30/22 2328   07/30/22 2130  ceFEPIme (MAXIPIME) 2 g in sodium chloride 0.9 % 100 mL IVPB        2 g 200 mL/hr over 30 Minutes Intravenous  Once 07/30/22 2126 07/30/22 2225       Subjective: Patient seen and examined at bedside today.  Hemodynamically stable lying in bed.  Appears comfortable.  Complains of pain/discomfort on the left foot  Objective: Vitals:   07/31/22 2015 07/31/22 2352 08/01/22 0422 08/01/22 0810  BP: 129/83 134/76 130/62 (!) 145/65  Pulse: 69 60 62 65  Resp: 14 18 20 18   Temp: 98 F (36.7 C) 97.6 F (36.4 C) 98.4 F (36.9 C) 98.2 F (36.8 C)  TempSrc: Oral Oral Oral Oral  SpO2: 97% 100% 98% 99%  Weight:      Height:        Intake/Output Summary (Last 24 hours) at 08/01/2022 1135 Last data filed at 08/01/2022 0515 Gross per 24 hour  Intake 724.82 ml  Output --  Net  724.82 ml   Filed Weights   07/30/22 1800  Weight: 94.3 kg    Examination:  General exam: Overall comfortable, not in distress HEENT: PERRL Respiratory system:  no wheezes or crackles  Cardiovascular system: S1 & S2 heard, RRR.  Gastrointestinal system: Abdomen is nondistended, soft and nontender. Central nervous system: Alert and oriented Extremities: No edema, no clubbing ,no cyanosis, foul-smelling wound on the lateral side of the left foot, amputation of left second toe Skin: No rashes, no ulcers,no icterus      Data Reviewed: I have personally reviewed following labs and imaging studies  CBC: Recent Labs  Lab 07/26/22 0808 07/30/22 1845 07/31/22 0637 08/01/22 0133  WBC  --  20.0* 17.5* 16.1*  NEUTROABS  --  15.4*  --   --   HGB 14.6 13.3 12.2* 12.1*  HCT 43.0 39.8 36.6* 36.9*  MCV  --  82.2 82.6 82.0  PLT  --  470* 397 376   Basic Metabolic Panel: Recent Labs  Lab 07/26/22 0808 07/30/22 1845 07/31/22 0637 08/01/22 0133  NA 137 135 134* 133*  K 4.3 3.8 3.7 4.6  CL 101 98 102 100  CO2  --  27 22 25   GLUCOSE 190* 95 103* 160*  BUN 30* 33* 20 17  CREATININE 1.30* 1.15 0.78 0.84  CALCIUM  --  9.7 9.2 9.1  MG  --   --   --  1.8     No results found for this or any previous visit (from the past 240 hour(s)).   Radiology Studies: VAS [place] LOWER EXTREMITY SAPHENOUS VEIN MAPPING  Result Date: 07/31/2022 LOWER EXTREMITY VEIN MAPPING Patient Name:  Grant Yang  Date of Exam:   07/31/2022 Medical Rec #: [id number]         Accession #:    [id number] Date of Birth: 07-Sep-1955          Patient Gender: M Patient Age:   11 years Exam Location:  [facility] Procedure:      VAS [place] LOWER EXTREMITY SAPHENOUS VEIN MAPPING Referring Phys: Heath Lark --------------------------------------------------------------------------------  Indications: ulceration  Comparison Study: No previous study. Performing Technologist: McKayla Maag RVT, VT  Examination Guidelines: A complete evaluation includes B-mode imaging, spectral Doppler, color Doppler, and power Doppler as needed of all accessible portions of each vessel. Bilateral testing is considered an integral part of a complete examination. Limited examinations for reoccurring indications may be performed as noted. +--------------+-------------+----------------------+--------------+-----------+  RT Diameter   RT Findings          GSV           LT Diameter  LT Findings      (cm)                                             (cm)                  +--------------+-------------+----------------------+--------------+-----------+                    not         Saphenofemoral         0.52                                visualized  Junction                                 +--------------+-------------+----------------------+--------------+-----------+      0.19                      Proximal thigh         0.37                 +--------------+-------------+----------------------+--------------+-----------+      0.17                        Mid thigh            0.32                 +--------------+-------------+----------------------+--------------+-----------+      0.15                       Distal thigh          0.26                 +--------------+-------------+----------------------+--------------+-----------+      0.16                           Knee              0.30                 +--------------+-------------+----------------------+--------------+-----------+      0.25                        Prox calf            0.34      branching  +--------------+-------------+----------------------+--------------+-----------+      0.16                         Mid calf            0.23      branching  +--------------+-------------+----------------------+--------------+-----------+      0.21                       Distal calf           0.39                 +--------------+-------------+----------------------+--------------+-----------+ Diagnosing physician: Lemar Livings MD Electronically signed by Lemar Livings MD on 07/31/2022 at 2:32:08 PM.    Final    DG Foot Complete Left  Result Date: 07/30/2022 CLINICAL DATA:  Lateral left foot wound. EXAM: LEFT FOOT - COMPLETE 3+ VIEW COMPARISON:  May 29, 2022 FINDINGS: There is evidence of prior transmetatarsal amputation of the second left toe. A very small nondisplaced fracture deformity is seen involving the proximal tip of the base of the fifth left metatarsal. This  represents a new finding when compared to the prior study. There is no evidence of dislocation. A 3.8 cm x 4.5 cm soft tissue defect is seen along the lateral aspect of the mid left foot. This is adjacent to the proximal aspect of the fifth left metatarsal. No adjacent cortical destruction is identified. IMPRESSION: 1. Soft tissue defect along the lateral aspect of the mid left foot, as described above, without evidence of acute osteomyelitis. 2. Very small nondisplaced fracture deformity involving the  proximal tip of the base of the fifth left metatarsal. 3. 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 Electronically Signed   By: Aram Candela M.D.   On: 07/30/2022 19:25    Scheduled Meds:  aspirin EC  81 mg Oral Daily   atorvastatin  40 mg Oral Daily   gabapentin  600 mg Oral Q1200   heparin  5,000 Units Subcutaneous Q8H   insulin aspart  0-15 Units Subcutaneous Q6H   irbesartan  150 mg Oral Daily   Continuous Infusions:  ceFEPime (MAXIPIME) IV 2 g (08/01/22 0603)   vancomycin 1,000 mg (08/01/22 0847)     LOS: 2 days   Burnadette Pop, MD [facility] P6/12/2022, 11:35 AM

## 2022-08-01 NOTE — Progress Notes (Addendum)
  Progress Note    08/01/2022 7:10 AM 1 Day Post-Op  Subjective:  no complaints   Vitals:   07/31/22 2352 08/01/22 0422  BP: 134/76 130/62  Pulse: 60 62  Resp: 18 20  Temp: 97.6 F (36.4 C) 98.4 F (36.9 C)  SpO2: 100% 98%   Physical Exam: Cardiac:  regular Lungs:  non labored Extremities:  R CF access site c/d/I without swelling or hematoma Abdomen:  soft Neurologic: alert and oriented  CBC    Component Value Date/Time   WBC 16.1 (H) 08/01/2022 0133   RBC 4.50 08/01/2022 0133   HGB 12.1 (L) 08/01/2022 0133   HCT 36.9 (L) 08/01/2022 0133   PLT 376 08/01/2022 0133   MCV 82.0 08/01/2022 0133   MCH 26.9 08/01/2022 0133   MCHC 32.8 08/01/2022 0133   RDW 13.2 08/01/2022 0133   LYMPHSABS 2.3 07/30/2022 1845   MONOABS 1.5 (H) 07/30/2022 1845   EOSABS 0.5 07/30/2022 1845   BASOSABS 0.1 07/30/2022 1845    BMET    Component Value Date/Time   NA 133 (L) 08/01/2022 0133   K 4.6 08/01/2022 0133   CL 100 08/01/2022 0133   CO2 25 08/01/2022 0133   GLUCOSE 160 (H) 08/01/2022 0133   BUN 17 08/01/2022 0133   CREATININE 0.84 08/01/2022 0133   CALCIUM 9.1 08/01/2022 0133   GFRNONAA >60 08/01/2022 0133   GFRAA >60 07/27/2017 1504    INR No results found for: "INR"   Intake/Output Summary (Last 24 hours) at 08/01/2022 0710 Last data filed at 08/01/2022 0515 Gross per 24 hour  Intake 724.82 ml  Output --  Net 724.82 ml     Assessment/Plan:  67 y.o. male is s/p Aortogram, LLE arteriogram 1 Day Post-Op   R CF access site c/d/I without swelling or hematoma No endovascular revascularization options Vein mapping shows adequate conduit for bypass Continue local wound care to left foot Needs left below knee popliteal to posterior tibial bypass Will defer timing of surgery to Dr. Erlene Senters, PA-C [facility] [phone] 08/01/2022 7:10 AM   VASCULAR STAFF ADDENDUM: I have independently interviewed and examined the patient. I  agree with the above.  Given his postop day 1 from the left lower extremity angiography for critical limb ischemia with tissue loss.  He has 2 revascularization options, 1 being below-knee popliteal artery to posterior tibial artery bypass, and appears to have usable vein, the other option is complex endovascular repair with attempted balloon angioplasty of the posterior tibial artery.  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  The flow in his foot appears reasonable, with single-vessel peroneal outflow reconstituting the posterior tibial artery, which subsequently fills the foot.  The posterior tibial artery is palpable.  I am concerned that even with inline perfusion via the posterior tibial artery, he may continue to have wound healing difficulties due to the location of his wound on the lateral aspect of the foot. This angiosome is not supplied by the PT and will still require collateral effort.   After discussing the above with Iantha Fallen, he elected to pursue endovascular revascularization.  This will be scheduled for Thursday with one of my partners.   Fara Olden, MD [facility] Phone Number: [phone] 08/01/2022 2:21 PM

## 2022-08-02 ENCOUNTER — Encounter (HOSPITAL_COMMUNITY): Payer: Self-pay | Admitting: Vascular Surgery

## 2022-08-02 DIAGNOSIS — L97521 Non-pressure chronic ulcer of other part of left foot limited to breakdown of skin: Secondary | ICD-10-CM | POA: Diagnosis not present

## 2022-08-02 LAB — GLUCOSE, CAPILLARY
Glucose-Capillary: 142 mg/dL — ABNORMAL HIGH (ref 70–99)
Glucose-Capillary: 194 mg/dL — ABNORMAL HIGH (ref 70–99)
Glucose-Capillary: 194 mg/dL — ABNORMAL HIGH (ref 70–99)
Glucose-Capillary: 209 mg/dL — ABNORMAL HIGH (ref 70–99)
Glucose-Capillary: 225 mg/dL — ABNORMAL HIGH (ref 70–99)

## 2022-08-02 LAB — CBC
HCT: 34.6 % — ABNORMAL LOW (ref 39.0–52.0)
Hemoglobin: 11.5 g/dL — ABNORMAL LOW (ref 13.0–17.0)
MCH: 26.9 pg (ref 26.0–34.0)
MCHC: 33.2 g/dL (ref 30.0–36.0)
MCV: 81 fL (ref 80.0–100.0)
Platelets: 398 10*3/uL (ref 150–400)
RBC: 4.27 MIL/uL (ref 4.22–5.81)
RDW: 13.2 % (ref 11.5–15.5)
WBC: 16.9 10*3/uL — ABNORMAL HIGH (ref 4.0–10.5)
nRBC: 0 % (ref 0.0–0.2)

## 2022-08-02 NOTE — Progress Notes (Signed)
PROGRESS NOTE  Grant Yang  ZOX:[id number] DOB: 08-22-1955 DOA: 07/30/2022 PCP: Billie Lade, MD   Brief Narrative: Patient is a 67 year old male with history of osteomyelitis of the left lower extremity, diabetes type 2, COPD, hypertension, CVA, pressure cancer who presented with complaint of chronic nonhealing left lower extremity ulcer which was appearing worse with foul smell.  Vascular surgery consulted and following.  Plan for left below-knee popliteal to posterior tibial bypass tomorrow  Assessment & Plan:  Principal Problem:   Foot ulcer, left (HCC) Active Problems:   Hypertension   Uncontrolled type 2 diabetes mellitus with hyperglycemia (HCC)   COPD suggested by initial evaluation (HCC)   History of CVA (cerebrovascular accident)  Left foot ulcer: Chronic nonhealing left foot ulcer, appears infected. He was previously following with Dr. Lajoyce Corners and underwent amputation of left second toe. He  presented with leukocytosis.  X-ray without evidence of osteomyelitis.  Elevated ESR.  Vascular surgery consulted.  Underwent angiogram.Plan for left below-knee popliteal to posterior tibial bypass.  Will check if he needs to be seen by orthopedics, Dr. Lajoyce Corners after vascular procedure.  Currently in broad-spectrum antibiotics with vancomycin and cefepime.  History of CVA/peripheral vascular disease: On aspirin, statin.  Uncontrolled diabetes type 2: Recent A1c of 14.3.  Continue current insulin regimen.  Monitor blood sugars  Hypertension: On olmesartan.  Blood pressure stable           DVT prophylaxis:heparin injection 5,000 Units Start: 07/31/22 0600 SCDs Start: 07/31/22 0301     Code Status: Full Code  Family Communication: None at bedside  Patient status:Inpatient  Patient is from :home  Anticipated discharge WJ:XBJY  Estimated DC date:after full workups, vascular surgery clearance/orthopedics evaluation   Consultants: Vascular surgery  Procedures:  Angiogram  Antimicrobials:  Anti-infectives (From admission, onward)    Start     Dose/Rate Route Frequency Ordered Stop   07/31/22 0600  vancomycin (VANCOCIN) IVPB 1000 mg/200 mL premix        1,000 mg 200 mL/hr over 60 Minutes Intravenous Every 12 hours 07/31/22 0200     07/31/22 0600  ceFEPIme (MAXIPIME) 2 g in sodium chloride 0.9 % 100 mL IVPB        2 g 200 mL/hr over 30 Minutes Intravenous Every 8 hours 07/31/22 0200     07/30/22 2130  vancomycin (VANCOCIN) IVPB 1000 mg/200 mL premix        1,000 mg 200 mL/hr over 60 Minutes Intravenous  Once 07/30/22 2126 07/30/22 2328   07/30/22 2130  ceFEPIme (MAXIPIME) 2 g in sodium chloride 0.9 % 100 mL IVPB        2 g 200 mL/hr over 30 Minutes Intravenous  Once 07/30/22 2126 07/30/22 2225       Subjective: Patient seen and examined the bedside today.  Hemodynamically stable.  Comfortable, lying in bed.  Eating his breakfast.  Aware that he has been planned for revascularization procedure tomorrow  Objective: Vitals:   08/01/22 1958 08/01/22 2359 08/02/22 0430 08/02/22 1100  BP: 133/70 120/68 (!) 142/79 124/67  Pulse: 70 68 67 63  Resp: 20 20 18 20   Temp: 98.8 F (37.1 C) 98.5 F (36.9 C) 98.1 F (36.7 C) 97.8 F (36.6 C)  TempSrc: Oral Oral Oral Oral  SpO2: 98% 98% 98% 100%  Weight:      Height:        Intake/Output Summary (Last 24 hours) at 08/02/2022 1341 Last data filed at 08/02/2022 1227 Gross per 24 hour  Intake --  Output 200 ml  Net -200 ml   Filed Weights   07/30/22 1800  Weight: 94.3 kg    Examination:  General exam: Overall comfortable, not in distress HEENT: PERRL Respiratory system:  no wheezes or crackles  Cardiovascular system: S1 & S2 heard, RRR.  Gastrointestinal system: Abdomen is nondistended, soft and nontender. Central nervous system: Alert and oriented Extremities: No edema, no clubbing ,no cyanosis, foul-smelling wound on the lateral side of the left foot, amputation of left second  toe Skin: No rashes, no ulcers,no icterus     Data Reviewed: I have personally reviewed following labs and imaging studies  CBC: Recent Labs  Lab 07/30/22 1845 07/31/22 0637 08/01/22 0133 08/02/22 0044  WBC 20.0* 17.5* 16.1* 16.9*  NEUTROABS 15.4*  --   --   --   HGB 13.3 12.2* 12.1* 11.5*  HCT 39.8 36.6* 36.9* 34.6*  MCV 82.2 82.6 82.0 81.0  PLT 470* 397 376 398   Basic Metabolic Panel: Recent Labs  Lab 07/30/22 1845 07/31/22 0637 08/01/22 0133  NA 135 134* 133*  K 3.8 3.7 4.6  CL 98 102 100  CO2 27 22 25   GLUCOSE 95 103* 160*  BUN 33* 20 17  CREATININE 1.15 0.78 0.84  CALCIUM 9.7 9.2 9.1  MG  --   --  1.8     No results found for this or any previous visit (from the past 240 hour(s)).   Radiology Studies: No results found.  Scheduled Meds:  aspirin EC  81 mg Oral Daily   atorvastatin  40 mg Oral Daily   gabapentin  600 mg Oral Q1200   heparin  5,000 Units Subcutaneous Q8H   insulin aspart  0-15 Units Subcutaneous Q6H   irbesartan  150 mg Oral Daily   Continuous Infusions:  ceFEPime (MAXIPIME) IV 2 g (08/02/22 0520)   vancomycin 1,000 mg (08/02/22 0640)     LOS: 3 days   Burnadette Pop, MD [facility] P6/01/2023, 1:41 PM

## 2022-08-02 NOTE — Progress Notes (Addendum)
  Progress Note    08/02/2022 9:10 AM 2 Days Post-Op  Subjective:  no complaints    Vitals:   08/01/22 2359 08/02/22 0430  BP: 120/68 (!) 142/79  Pulse: 68 67  Resp: 20 18  Temp: 98.5 F (36.9 C) 98.1 F (36.7 C)  SpO2: 98% 98%    Physical Exam: Cardiac:  regular Lungs:  nonlabored Incisions:  right CF access site soft without hematoma Extremities:  L foot wrapped and dry   CBC    Component Value Date/Time   WBC 16.9 (H) 08/02/2022 0044   RBC 4.27 08/02/2022 0044   HGB 11.5 (L) 08/02/2022 0044   HCT 34.6 (L) 08/02/2022 0044   PLT 398 08/02/2022 0044   MCV 81.0 08/02/2022 0044   MCH 26.9 08/02/2022 0044   MCHC 33.2 08/02/2022 0044   RDW 13.2 08/02/2022 0044   LYMPHSABS 2.3 07/30/2022 1845   MONOABS 1.5 (H) 07/30/2022 1845   EOSABS 0.5 07/30/2022 1845   BASOSABS 0.1 07/30/2022 1845    BMET    Component Value Date/Time   NA 133 (L) 08/01/2022 0133   K 4.6 08/01/2022 0133   CL 100 08/01/2022 0133   CO2 25 08/01/2022 0133   GLUCOSE 160 (H) 08/01/2022 0133   BUN 17 08/01/2022 0133   CREATININE 0.84 08/01/2022 0133   CALCIUM 9.1 08/01/2022 0133   GFRNONAA >60 08/01/2022 0133   GFRAA >60 07/27/2017 1504    INR No results found for: "INR"   Intake/Output Summary (Last 24 hours) at 08/02/2022 0910 Last data filed at 08/01/2022 0930 Gross per 24 hour  Intake 240 ml  Output --  Net 240 ml      Assessment/Plan:  67 y.o. male is s/p: LLE angiogram on 6/10   -R CF access site soft without hematoma -Patient is agreeable to repeat angiogram tomorrow with attempted balloon angioplasty of the left posterior tibial artery. This may not work and he could still require a bypass -Continue local wound care to the left foot -Will make NPO at midnight and place consent orders for angio tomorrow  Loel Dubonnet, PA-C [facility] [phone] 08/02/2022 9:10 AM   I have seen and evaluated the patient. I agree with the PA note as documented  above. Angio tomorrow.  NPO after midnight.  Consent order placed.  Cephus Shelling, MD [facility] [place] Office: [phone]

## 2022-08-03 ENCOUNTER — Encounter (HOSPITAL_COMMUNITY): Admission: RE | Payer: Self-pay | Source: Home / Self Care

## 2022-08-03 ENCOUNTER — Ambulatory Visit (HOSPITAL_COMMUNITY): Admission: RE | Admit: 2022-08-03 | Payer: 59 | Source: Home / Self Care | Admitting: Vascular Surgery

## 2022-08-03 ENCOUNTER — Encounter (HOSPITAL_COMMUNITY)
Admission: EM | Disposition: A | Discharge status: Home / Self Care | Payer: Self-pay | Source: Home / Self Care | Attending: Internal Medicine

## 2022-08-03 DIAGNOSIS — L97521 Non-pressure chronic ulcer of other part of left foot limited to breakdown of skin: Secondary | ICD-10-CM | POA: Diagnosis not present

## 2022-08-03 DIAGNOSIS — T82858A Stenosis of vascular prosthetic devices, implants and grafts, initial encounter: Secondary | ICD-10-CM | POA: Diagnosis not present

## 2022-08-03 HISTORY — PX: PERIPHERAL VASCULAR BALLOON ANGIOPLASTY: CATH118281

## 2022-08-03 HISTORY — PX: LOWER EXTREMITY ANGIOGRAPHY: CATH118251

## 2022-08-03 LAB — GLUCOSE, CAPILLARY
Glucose-Capillary: 164 mg/dL — ABNORMAL HIGH (ref 70–99)
Glucose-Capillary: 195 mg/dL — ABNORMAL HIGH (ref 70–99)
Glucose-Capillary: 182 mg/dL — ABNORMAL HIGH (ref 70–99)
Glucose-Capillary: 201 mg/dL — ABNORMAL HIGH (ref 70–99)

## 2022-08-03 LAB — CBC
HCT: 36 % — ABNORMAL LOW (ref 39.0–52.0)
Hemoglobin: 11.8 g/dL — ABNORMAL LOW (ref 13.0–17.0)
MCH: 26.5 pg (ref 26.0–34.0)
MCHC: 32.8 g/dL (ref 30.0–36.0)
MCV: 80.9 fL (ref 80.0–100.0)
Platelets: 404 10*3/uL — ABNORMAL HIGH (ref 150–400)
RBC: 4.45 MIL/uL (ref 4.22–5.81)
RDW: 13 % (ref 11.5–15.5)
WBC: 16.7 10*3/uL — ABNORMAL HIGH (ref 4.0–10.5)
nRBC: 0 % (ref 0.0–0.2)

## 2022-08-03 LAB — CREATININE, SERUM
Creatinine, Ser: 0.93 mg/dL (ref 0.61–1.24)
GFR, Estimated: >60 mL/min (ref >60)

## 2022-08-03 SURGERY — ABDOMINAL AORTOGRAM W/LOWER EXTREMITY
Anesthesia: LOCAL

## 2022-08-03 SURGERY — LOWER EXTREMITY ANGIOGRAPHY
Anesthesia: LOCAL

## 2022-08-03 MED ORDER — SODIUM CHLORIDE 0.9 % IV SOLN: 250.0000 mL | INTRAVENOUS | Status: DC | PRN

## 2022-08-03 MED ORDER — LIDOCAINE HCL (PF) 1 % IJ SOLN
INTRAMUSCULAR | Status: DC | PRN
  Administered 2022-08-03: 15 mL via INTRADERMAL

## 2022-08-03 MED ORDER — CLOPIDOGREL BISULFATE 75 MG PO TABS
75.0000 mg | ORAL_TABLET | Freq: Every day | ORAL | Status: DC
  Administered 2022-08-04: 75 mg via ORAL
  Filled 2022-08-03: qty 1

## 2022-08-03 MED ORDER — CLOPIDOGREL BISULFATE 75 MG PO TABS
300.0000 mg | ORAL_TABLET | Freq: Once | ORAL | Status: AC
  Filled 2022-08-03: qty 4

## 2022-08-03 MED ORDER — SODIUM CHLORIDE 0.9 % IV SOLN: INTRAVENOUS | Status: AC

## 2022-08-03 MED ORDER — IODIXANOL 320 MG/ML IV SOLN
INTRAVENOUS | Status: DC | PRN
  Administered 2022-08-03: 50 mL via INTRA_ARTERIAL

## 2022-08-03 MED ORDER — LIDOCAINE HCL (PF) 1 % IJ SOLN
INTRAMUSCULAR | Status: AC
  Filled 2022-08-03: qty 30

## 2022-08-03 MED ORDER — HEPARIN SODIUM (PORCINE) 1000 UNIT/ML IJ SOLN
INTRAMUSCULAR | Status: AC
  Filled 2022-08-03: qty 10

## 2022-08-03 MED ORDER — FENTANYL CITRATE (PF) 100 MCG/2ML IJ SOLN
INTRAMUSCULAR | Status: DC | PRN
  Administered 2022-08-03: 25 ug via INTRAVENOUS

## 2022-08-03 MED ORDER — CLOPIDOGREL BISULFATE 300 MG PO TABS
ORAL_TABLET | ORAL | Status: AC
  Filled 2022-08-03: qty 1

## 2022-08-03 MED ORDER — HEPARIN (PORCINE) IN NACL 1000-0.9 UT/500ML-% IV SOLN
INTRAVENOUS | Status: DC | PRN
  Administered 2022-08-03 (×2): 500 mL

## 2022-08-03 MED ORDER — SODIUM CHLORIDE 0.9% FLUSH: 3.0000 mL | Freq: Two times a day (BID) | INTRAVENOUS | Status: DC

## 2022-08-03 MED ORDER — FENTANYL CITRATE (PF) 100 MCG/2ML IJ SOLN
INTRAMUSCULAR | Status: AC
  Filled 2022-08-03: qty 2

## 2022-08-03 MED ORDER — INSULIN ASPART 100 UNIT/ML IJ SOLN
0.0000 [IU] | Freq: Three times a day (TID) | INTRAMUSCULAR | Status: DC
  Administered 2022-08-04: 3 [IU] via SUBCUTANEOUS
  Administered 2022-08-04: 15 [IU] via SUBCUTANEOUS

## 2022-08-03 MED ORDER — ONDANSETRON HCL 4 MG/2ML IJ SOLN: 4.0000 mg | Freq: Four times a day (QID) | INTRAMUSCULAR | Status: DC | PRN

## 2022-08-03 MED ORDER — LABETALOL HCL 5 MG/ML IV SOLN: 10.0000 mg | INTRAVENOUS | Status: DC | PRN

## 2022-08-03 MED ORDER — HEPARIN SODIUM (PORCINE) 1000 UNIT/ML IJ SOLN
INTRAMUSCULAR | Status: DC | PRN
  Administered 2022-08-03: 9000 [IU] via INTRAVENOUS

## 2022-08-03 MED ORDER — MIDAZOLAM HCL 2 MG/2ML IJ SOLN
INTRAMUSCULAR | Status: DC | PRN
  Administered 2022-08-03: 1 mg via INTRAVENOUS

## 2022-08-03 MED ORDER — SODIUM CHLORIDE 0.9 % IV SOLN: INTRAVENOUS | Status: DC

## 2022-08-03 MED ORDER — MIDAZOLAM HCL 2 MG/2ML IJ SOLN
INTRAMUSCULAR | Status: AC
  Filled 2022-08-03: qty 2

## 2022-08-03 MED ORDER — ACETAMINOPHEN 325 MG PO TABS: 650.0000 mg | ORAL_TABLET | ORAL | Status: DC | PRN

## 2022-08-03 MED ORDER — SODIUM CHLORIDE 0.9% FLUSH: 3.0000 mL | INTRAVENOUS | Status: DC | PRN

## 2022-08-03 MED ORDER — CLOPIDOGREL BISULFATE 300 MG PO TABS
ORAL_TABLET | ORAL | Status: DC | PRN
  Administered 2022-08-03: 300 mg via ORAL

## 2022-08-03 MED ORDER — HYDRALAZINE HCL 20 MG/ML IJ SOLN: 5.0000 mg | INTRAMUSCULAR | Status: DC | PRN

## 2022-08-03 SURGICAL SUPPLY — 16 items
BALLN STERLING OTW 3X220X150 (BALLOONS) ×2
BALLOON STERLING OTW 3X220X150 (BALLOONS) IMPLANT
CATH OMNI FLUSH 5F 65CM (CATHETERS) IMPLANT
CATH QUICKCROSS .018X135CM (MICROCATHETER) IMPLANT
DEVICE CLOSURE MYNXGRIP 5F (Vascular Products) IMPLANT
GLIDEWIRE ADV .035X260CM (WIRE) IMPLANT
KIT ENCORE 26 ADVANTAGE (KITS) IMPLANT
KIT MICROPUNCTURE NIT STIFF (SHEATH) IMPLANT
KIT PV (KITS) ×2 IMPLANT
SHEATH CATAPULT 5FR 60 (SHEATH) IMPLANT
SHEATH PINNACLE 5F 10CM (SHEATH) IMPLANT
SYR MEDRAD MARK 7 150ML (SYRINGE) ×2 IMPLANT
TRANSDUCER W/STOPCOCK (MISCELLANEOUS) ×2 IMPLANT
TRAY PV CATH (CUSTOM PROCEDURE TRAY) ×2 IMPLANT
WIRE BENTSON .035X145CM (WIRE) IMPLANT
WIRE G V18X300CM (WIRE) IMPLANT

## 2022-08-03 NOTE — Progress Notes (Signed)
Pt IV occluded. Pt had been silencing alarm without notifying RN. Pt understands he will need new IV to get ABX. Pt resting with call bell within reach.  Will continue to monitor.

## 2022-08-03 NOTE — Op Note (Signed)
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  Ultrasound-guided access right common femoral artery 2.  Catheter selection of left SFA for dedicated angiogram of left lower extremity 3.  Catheter selection of left posterior tibial for dedicated angiogram of left lower extremity 4.  Left posterior tibial artery angioplasty (3 mm x 220 mm Sterling) 5.  Mynx closure of the right common femoral artery 6.  31 minutes of monitored moderate conscious sedation time  Indications: Patient is a 67 year old male seen with nonhealing ischemic ulcer to the left lower extremity.  He presents today for attempted tibial intervention after risks benefits discussed.  Findings:   Ultrasound-guided access of the right common femoral artery.  Aortogram was deferred as this was performed earlier this week.  Aortic bifurcation was crossed and I got a long 5 French sheath into the left SFA.  We got dedicated images of the left tibial vessels.  With the V18 wire and a quick cross catheter I was able to cannulate the posterior tibial and cross the chronic total occlusion into the ankle.  This was angioplastied with a 3 mm Sterling to nominal pressure for 2 minutes throughout the segment.  Now has two-vessel runoff with widely patent posterior tibial at completion.   Procedure:  The patient was identified in the holding area and taken to room 8.  The patient was then placed supine on the table and prepped and draped in the usual sterile fashion.  A time out was called.  The patient received Versed and fentanyl for conscious moderate sedation.  Vital signs were monitored including heart rate, respiratory rate, oxygenation and blood pressure.  I was present for all of moderate sedation Ultrasound was used  to evaluate the right common femoral artery.  It was patent .  A digital ultrasound image was acquired.  A micropuncture needle was used to access the right common femoral artery under ultrasound guidance.  An 018 wire was advanced without resistance and a micropuncture sheath was placed.  The 018 wire was removed and a benson wire was placed.  The micropuncture sheath was exchanged for a 5 french sheath.  An omniflush catheter was advanced over the wire to the level of L-1.  We crossed the aortic bifurcation and got a wire into the left SFA and then put a long 5 French sheath in the right groin over the aortic bifurcation.  The patient was given 100 units/kg IV heparin.  I then went down and got some dedicated left lower extremity imaging of the tibial vessels to identify the posterior tibial.  I then used a V18 with a quick cross catheter to cannulate the posterior tibial and got a wire down through the chronic total occlusion into the ankle through the plantar arch.  The entire posterior tibial was angioplastied with a 3 mm x 20 mm Sterling for 2 minutes throughout segment.  Excellent results.  Widely patent vessel at completion.  Wires and catheters were removed and a short 5 French sheath was placed in the right groin.  Access shot obtained.  Mynx closure device deployed.  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  Has two-vessel runoff now in the left lower extremity.  Needs dual antiplatelet therapy with aspirin Plavix and statin.  Cephus Shelling, MD [facility] Office: [phone]

## 2022-08-03 NOTE — Progress Notes (Signed)
PROGRESS NOTE  Grant Yang  ZOX:[id number] DOB: 1955-07-04 DOA: 07/30/2022 PCP: Billie Lade, MD   Brief Narrative: Patient is a 67 year old male with history of osteomyelitis of the left lower extremity, diabetes type 2, COPD, hypertension, CVA, pressure cancer who presented with complaint of chronic nonhealing left lower extremity ulcer which was appearing worse with foul smell.  Vascular surgery consulted and following.  Plan for left below-knee popliteal to posterior tibial bypass today  Assessment & Plan:  Principal Problem:   Foot ulcer, left (HCC) Active Problems:   Hypertension   Uncontrolled type 2 diabetes mellitus with hyperglycemia (HCC)   COPD suggested by initial evaluation (HCC)   History of CVA (cerebrovascular accident)  Left foot ulcer: Chronic nonhealing left foot ulcer, appears infected. He was previously following with Dr. Lajoyce Corners and underwent amputation of left second toe. He  presented with leukocytosis.  X-ray without evidence of osteomyelitis.  Elevated ESR.  Vascular surgery consulted.  Underwent angiogram.Plan for left below-knee popliteal to posterior tibial bypass.  Will check if he needs to be seen by orthopedics, Dr. Lajoyce Corners after vascular procedure.  Currently in broad-spectrum antibiotics with vancomycin and cefepime.  History of CVA/peripheral vascular disease: On aspirin, statin.  Uncontrolled diabetes type 2: Recent A1c of 14.3.  Continue current insulin regimen.  Monitor blood sugars  Hypertension: On olmesartan.  Blood pressure stable           DVT prophylaxis:heparin injection 5,000 Units Start: 07/31/22 0600 SCDs Start: 07/31/22 0301     Code Status: Full Code  Family Communication: None at bedside  Patient status:Inpatient  Patient is from :home  Anticipated discharge WJ:XBJY  Estimated DC date:after full workups, vascular surgery clearance/orthopedics evaluation   Consultants: Vascular surgery  Procedures:  Angiogram  Antimicrobials:  Anti-infectives (From admission, onward)    Start     Dose/Rate Route Frequency Ordered Stop   07/31/22 0600  vancomycin (VANCOCIN) IVPB 1000 mg/200 mL premix        1,000 mg 200 mL/hr over 60 Minutes Intravenous Every 12 hours 07/31/22 0200     07/31/22 0600  ceFEPIme (MAXIPIME) 2 g in sodium chloride 0.9 % 100 mL IVPB        2 g 200 mL/hr over 30 Minutes Intravenous Every 8 hours 07/31/22 0200     07/30/22 2130  vancomycin (VANCOCIN) IVPB 1000 mg/200 mL premix        1,000 mg 200 mL/hr over 60 Minutes Intravenous  Once 07/30/22 2126 07/30/22 2328   07/30/22 2130  ceFEPIme (MAXIPIME) 2 g in sodium chloride 0.9 % 100 mL IVPB        2 g 200 mL/hr over 30 Minutes Intravenous  Once 07/30/22 2126 07/30/22 2225       Subjective: Patient seen and examined at the bedside today.  Hemodynamically stable.  Lying in bed.  No new complaints.  Waiting for revascularization surgery   Objective: Vitals:   08/02/22 2337 08/03/22 0309 08/03/22 0909 08/03/22 1144  BP: (!) 146/77 (!) 149/82 129/71 132/71  Pulse: 64 64 (!) 57 67  Resp: 18 16 18 18   Temp: 98 F (36.7 C) 98.1 F (36.7 C) 97.9 F (36.6 C) 97.6 F (36.4 C)  TempSrc: Oral Oral Oral Oral  SpO2: 96% 96% 100% 100%  Weight:      Height:        Intake/Output Summary (Last 24 hours) at 08/03/2022 1153 Last data filed at 08/03/2022 1129 Gross per 24 hour  Intake --  Output  1200 ml  Net -1200 ml   Filed Weights   07/30/22 1800  Weight: 94.3 kg    Examination: General exam: Overall comfortable, not in distress HEENT: PERRL Respiratory system:  no wheezes or crackles  Cardiovascular system: S1 & S2 heard, RRR.  Gastrointestinal system: Abdomen is nondistended, soft and nontender. Central nervous system: Alert and oriented Extremities: No edema, no clubbing ,no cyanosis, foul-smelling wound on the lateral side of the left foot, amputation of left second toe Skin: No rashes, no ulcers,no icterus      Data Reviewed: I have personally reviewed following labs and imaging studies  CBC: Recent Labs  Lab 07/30/22 1845 07/31/22 0637 08/01/22 0133 08/02/22 0044 08/03/22 0100  WBC 20.0* 17.5* 16.1* 16.9* 16.7*  NEUTROABS 15.4*  --   --   --   --   HGB 13.3 12.2* 12.1* 11.5* 11.8*  HCT 39.8 36.6* 36.9* 34.6* 36.0*  MCV 82.2 82.6 82.0 81.0 80.9  PLT 470* 397 376 398 404*   Basic Metabolic Panel: Recent Labs  Lab 07/30/22 1845 07/31/22 0637 08/01/22 0133 08/03/22 0100  NA 135 134* 133*  --   K 3.8 3.7 4.6  --   CL 98 102 100  --   CO2 27 22 25   --   GLUCOSE 95 103* 160*  --   BUN 33* 20 17  --   CREATININE 1.15 0.78 0.84 0.93  CALCIUM 9.7 9.2 9.1  --   MG  --   --  1.8  --      No results found for this or any previous visit (from the past 240 hour(s)).   Radiology Studies: No results found.  Scheduled Meds:  aspirin EC  81 mg Oral Daily   atorvastatin  40 mg Oral Daily   gabapentin  600 mg Oral Q1200   heparin  5,000 Units Subcutaneous Q8H   insulin aspart  0-15 Units Subcutaneous Q6H   irbesartan  150 mg Oral Daily   Continuous Infusions:  sodium chloride 100 mL/hr at 08/03/22 1040   ceFEPime (MAXIPIME) IV 2 g (08/03/22 0510)   vancomycin 1,000 mg (08/03/22 0602)     LOS: 4 days   Burnadette Pop, MD [facility] P6/13/2024, 11:53 AM

## 2022-08-03 NOTE — Progress Notes (Signed)
Pt NPO for first hour after procedure. Pt not following instructions coming straining and bedrest. Pt states he had procedure before and knows he is not bleeding. He will tell me if he does. Pt resting with call bell within reach.  Will continue to monitor.

## 2022-08-03 NOTE — Progress Notes (Addendum)
I have seen and evaluated the patient this morning. He is still agreeable to LLE angiogram today with possible angioplasty of the left posterior tibial artery.  He has been NPO since midnight. All questions answered.  Loel Dubonnet, PA-C [facility] 08/03/2022, 7:53AM  I have seen and evaluated the patient. I agree with the PA note as documented above.  Plan attempt at revascularization of posterior tibial with possible retrograde tibial access.  Risk benefits discussed.  Nonhealing wound.  Cephus Shelling, MD [facility] [place] Office: [phone]

## 2022-08-03 NOTE — Progress Notes (Signed)
Pt arrived from ...cath.., A/ox 4...pt denies any pain, MD aware,CCMD called. CHG bath given,no further needs at this time   

## 2022-08-04 ENCOUNTER — Encounter (HOSPITAL_COMMUNITY): Payer: Self-pay | Admitting: Vascular Surgery

## 2022-08-04 ENCOUNTER — Other Ambulatory Visit (HOSPITAL_COMMUNITY): Payer: Self-pay

## 2022-08-04 ENCOUNTER — Telehealth: Payer: Self-pay | Admitting: Physician Assistant

## 2022-08-04 DIAGNOSIS — L97521 Non-pressure chronic ulcer of other part of left foot limited to breakdown of skin: Secondary | ICD-10-CM | POA: Diagnosis not present

## 2022-08-04 LAB — CBC
HCT: 34.6 % — ABNORMAL LOW (ref 39.0–52.0)
Hemoglobin: 11.7 g/dL — ABNORMAL LOW (ref 13.0–17.0)
MCH: 27.9 pg (ref 26.0–34.0)
MCHC: 33.8 g/dL (ref 30.0–36.0)
MCV: 82.4 fL (ref 80.0–100.0)
Platelets: 383 10*3/uL (ref 150–400)
RBC: 4.2 MIL/uL — ABNORMAL LOW (ref 4.22–5.81)
RDW: 13.1 % (ref 11.5–15.5)
WBC: 17.4 10*3/uL — ABNORMAL HIGH (ref 4.0–10.5)
nRBC: 0 % (ref 0.0–0.2)

## 2022-08-04 LAB — BASIC METABOLIC PANEL
Anion gap: 8 (ref 5–15)
BUN: 14 mg/dL (ref 8–23)
CO2: 25 mmol/L (ref 22–32)
Calcium: 8.9 mg/dL (ref 8.9–10.3)
Chloride: 100 mmol/L (ref 98–111)
Creatinine, Ser: 0.87 mg/dL (ref 0.61–1.24)
GFR, Estimated: >60 mL/min (ref >60)
Glucose, Bld: 202 mg/dL — ABNORMAL HIGH (ref 70–99)
Potassium: 4.1 mmol/L (ref 3.5–5.1)
Sodium: 133 mmol/L — ABNORMAL LOW (ref 135–145)

## 2022-08-04 LAB — GLUCOSE, CAPILLARY
Glucose-Capillary: 191 mg/dL — ABNORMAL HIGH (ref 70–99)
Glucose-Capillary: 370 mg/dL — ABNORMAL HIGH (ref 70–99)

## 2022-08-04 MED ORDER — CLOPIDOGREL BISULFATE 75 MG PO TABS
75.0000 mg | ORAL_TABLET | Freq: Every day | ORAL | 3 refills | Status: DC
  Filled 2022-08-04: qty 30, 30d supply, fill #0

## 2022-08-04 MED ORDER — DOXYCYCLINE HYCLATE 100 MG PO TABS
100.0000 mg | ORAL_TABLET | Freq: Two times a day (BID) | ORAL | 0 refills | Status: AC
  Filled 2022-08-04: qty 28, 14d supply, fill #0

## 2022-08-04 NOTE — Progress Notes (Signed)
  Progress Note    08/04/2022 8:02 AM 1 Day Post-Op  Subjective:  no complaints   Vitals:   08/03/22 2350 08/04/22 0419  BP: 122/62 112/69  Pulse: 64 69  Resp: 20 19  Temp: 98.4 F (36.9 C) 98.2 F (36.8 C)  SpO2: 98% 96%   Physical Exam: Cardiac:  regular Lungs:  non labored Extremities:  R CF access site c/d/I without swelling or hematoma Abdomen:  soft Neurologic: alert and oriented  CBC    Component Value Date/Time   WBC 17.4 (H) 08/04/2022 0115   RBC 4.20 (L) 08/04/2022 0115   HGB 11.7 (L) 08/04/2022 0115   HCT 34.6 (L) 08/04/2022 0115   PLT 383 08/04/2022 0115   MCV 82.4 08/04/2022 0115   MCH 27.9 08/04/2022 0115   MCHC 33.8 08/04/2022 0115   RDW 13.1 08/04/2022 0115   LYMPHSABS 2.3 07/30/2022 1845   MONOABS 1.5 (H) 07/30/2022 1845   EOSABS 0.5 07/30/2022 1845   BASOSABS 0.1 07/30/2022 1845    BMET    Component Value Date/Time   NA 133 (L) 08/04/2022 0115   K 4.1 08/04/2022 0115   CL 100 08/04/2022 0115   CO2 25 08/04/2022 0115   GLUCOSE 202 (H) 08/04/2022 0115   BUN 14 08/04/2022 0115   CREATININE 0.87 08/04/2022 0115   CALCIUM 8.9 08/04/2022 0115   GFRNONAA >60 08/04/2022 0115   GFRAA >60 07/27/2017 1504    INR No results found for: "INR"   Intake/Output Summary (Last 24 hours) at 08/04/2022 0802 Last data filed at 08/04/2022 0643 Gross per 24 hour  Intake 580 ml  Output 1550 ml  Net -970 ml      Assessment/Plan:  67 y.o. male is s/p Aortogram, LLE arteriogram 1 Day Post-Op  Recanalization of the PT. Palpable. Optimized from a vascular standpoint.  Will have Dr. Lajoyce Corners stop by today to ensure he does not need further intervention.   Continue current medication regimen.    Victorino Sparrow, MD [facility] [phone] 08/04/2022 8:02 AM

## 2022-08-04 NOTE — Telephone Encounter (Signed)
Appt has been scheduled.

## 2022-08-04 NOTE — Progress Notes (Addendum)
  Progress Note    08/04/2022 8:02 AM 1 Day Post-Op  Subjective:  no complaints   Vitals:   08/03/22 2350 08/04/22 0419  BP: 122/62 112/69  Pulse: 64 69  Resp: 20 19  Temp: 98.4 F (36.9 C) 98.2 F (36.8 C)  SpO2: 98% 96%   Physical Exam: Cardiac:  regular Lungs:  non labored Incisions:  R CF access without swelling or hematoma Extremities:  LLE well perfused and warm with brisk PT and peroneal signals. Left foot wounds dry Neurologic: alert and oriented  CBC    Component Value Date/Time   WBC 17.4 (H) 08/04/2022 0115   RBC 4.20 (L) 08/04/2022 0115   HGB 11.7 (L) 08/04/2022 0115   HCT 34.6 (L) 08/04/2022 0115   PLT 383 08/04/2022 0115   MCV 82.4 08/04/2022 0115   MCH 27.9 08/04/2022 0115   MCHC 33.8 08/04/2022 0115   RDW 13.1 08/04/2022 0115   LYMPHSABS 2.3 07/30/2022 1845   MONOABS 1.5 (H) 07/30/2022 1845   EOSABS 0.5 07/30/2022 1845   BASOSABS 0.1 07/30/2022 1845    BMET    Component Value Date/Time   NA 133 (L) 08/04/2022 0115   K 4.1 08/04/2022 0115   CL 100 08/04/2022 0115   CO2 25 08/04/2022 0115   GLUCOSE 202 (H) 08/04/2022 0115   BUN 14 08/04/2022 0115   CREATININE 0.87 08/04/2022 0115   CALCIUM 8.9 08/04/2022 0115   GFRNONAA >60 08/04/2022 0115   GFRAA >60 07/27/2017 1504    INR No results found for: "INR"   Intake/Output Summary (Last 24 hours) at 08/04/2022 0802 Last data filed at 08/04/2022 1610 Gross per 24 hour  Intake 580 ml  Output 1550 ml  Net -970 ml     Assessment/Plan:  67 y.o. male is s/p Aortogram, Arteriogram left posterior tibial artery angioplasty 1 Day Post-Op   R CF access site without swelling or hematoma LLE well perfused and warm with Brisk PT and Peroneal signals Continue Aspirin, Statin, Plavix Okay for discharge from vascular standpoint Will follow up in our office in 4 weeks with LLE arterial duplex and ABIs   Graceann Congress, PA-C [facility] [phone] 08/04/2022 8:02 AM  I have  seen and evaluated the patient. I agree with the PA note as documented above.  Excellent results after antegrade recanalization of the left posterior tibial artery.  Groin access site looks good.  Cephus Shelling, MD [facility] [place] Office: [phone]

## 2022-08-04 NOTE — Evaluation (Signed)
Physical Therapy Evaluation Patient Details Name: Grant Yang MRN: [id number] DOB: 03/03/1955 Today's Date: 08/04/2022  History of Present Illness  Pt is a 67 y.o. male s/p LLE angiogram. PMH: s/p L foot 2nd ray amp April 2024, DMII, prostate CA in remission, GERD, neuropathy   Clinical Impression  PT eval complete. Pt is independent with bed mobility, transfers, and ambulation 500' without AD. Steady gait noted. Supervision provided for ascend/descend 3 steps with L rail. No further skilled PT intervention indicated. PT signing off.        Recommendations for follow up therapy are one component of a multi-disciplinary discharge planning process, led by the attending physician.  Recommendations may be updated based on patient status, additional functional criteria and insurance authorization.  Follow Up Recommendations       Assistance Recommended at Discharge None  Patient can return home with the following       Equipment Recommendations None recommended by PT  Recommendations for Other Services       Functional Status Assessment Patient has not had a recent decline in their functional status     Precautions / Restrictions Precautions Precautions: None      Mobility  Bed Mobility Overal bed mobility: Independent                  Transfers Overall transfer level: Independent Equipment used: None                    Ambulation/Gait Ambulation/Gait assistance: Independent Gait Distance (Feet): 500 Feet Assistive device: None Gait Pattern/deviations: WFL(Within Functional Limits)   Gait velocity interpretation: >2.62 ft/sec, indicative of community ambulatory      Stairs Stairs: Yes Stairs assistance: Supervision Stair Management: One rail Left, Forwards, Alternating pattern Number of Stairs: 3    Wheelchair Mobility    Modified Rankin (Stroke Patients Only)       Balance Overall balance assessment: No apparent balance deficits  (not formally assessed)                                           Pertinent Vitals/Pain Pain Assessment Pain Assessment: Faces Faces Pain Scale: Hurts a little bit Pain Location: L foot Pain Descriptors / Indicators: Discomfort Pain Intervention(s): Monitored during session    Home Living Family/patient expects to be discharged to:: Private residence Living Arrangements: Alone Available Help at Discharge: Friend(s) Type of Home: House Home Access: Stairs to enter Entrance Stairs-Rails: Right;Left;Can reach both Secretary/administrator of Steps: 3   Home Layout: One level Home Equipment: Agricultural consultant (2 wheels)      Prior Function Prior Level of Function : Independent/Modified Independent;Driving             Mobility Comments: independent. Enjoys riding his motorcycle. ADLs Comments: independent     Hand Dominance   Dominant Hand: Right    Extremity/Trunk Assessment   Upper Extremity Assessment Upper Extremity Assessment: Overall WFL for tasks assessed    Lower Extremity Assessment Lower Extremity Assessment: LLE deficits/detail;RLE deficits/detail RLE Sensation: history of peripheral neuropathy LLE Deficits / Details: h/o L 2nd ray amp 05/2022 LLE Sensation: history of peripheral neuropathy    Cervical / Trunk Assessment Cervical / Trunk Assessment: Normal  Communication   Communication: No difficulties  Cognition Arousal/Alertness: Awake/alert Behavior During Therapy: WFL for tasks assessed/performed Overall Cognitive Status: Within Functional Limits for tasks assessed  General Comments      Exercises     Assessment/Plan    PT Assessment Patient does not need any further PT services  PT Problem List         PT Treatment Interventions      PT Goals (Current goals can be found in the Care Plan section)  Acute Rehab PT Goals Patient Stated Goal: home to ride his  motorcycle PT Goal Formulation: All assessment and education complete, DC therapy    Frequency       Co-evaluation               AM-PAC PT "6 Clicks" Mobility  Outcome Measure Help needed turning from your back to your side while in a flat bed without using bedrails?: None Help needed moving from lying on your back to sitting on the side of a flat bed without using bedrails?: None Help needed moving to and from a bed to a chair (including a wheelchair)?: None Help needed standing up from a chair using your arms (e.g., wheelchair or bedside chair)?: None Help needed to walk in hospital room?: None Help needed climbing 3-5 steps with a railing? : A Little 6 Click Score: 23    End of Session Equipment Utilized During Treatment: Gait belt Activity Tolerance: Patient tolerated treatment well Patient left: in bed;with call bell/phone within reach Nurse Communication: Mobility status PT Visit Diagnosis: Pain;Difficulty in walking, not elsewhere classified (R26.2) Pain - Right/Left: Left Pain - part of body: Ankle and joints of foot    Time: 1610-9604 PT Time Calculation (min) (ACUTE ONLY): 13 min   Charges:   PT Evaluation $PT Eval Low Complexity: 1 Low          Ferd Glassing., PT  Office # [phone]   Ilda Foil 08/04/2022, 11:41 AM

## 2022-08-04 NOTE — Telephone Encounter (Signed)
-----   Message from Graceann Congress, [place] sent at 08/04/2022  8:04 AM EDT ----- S/p Aortogram, Arteriogram LLE with angioplasty of Left PT by Dr. Chestine Spore. He is a Dr. Karin Lieu patient though. Needs follow up in 4 weeks with LLE arterial duplex and ABI. thanks

## 2022-08-04 NOTE — Discharge Summary (Signed)
Physician Discharge Summary  Grant Yang ZOX:[id number] DOB: Jul 20, 1955 DOA: 07/30/2022  PCP: Grant Lade, MD  Admit date: 07/30/2022 Discharge date: 08/04/2022  Admitted From: Home Disposition:  Home  Discharge Condition:Stable CODE STATUS:FULL Diet recommendation: Heart Healthy   Brief/Interim Summary: Patient is a 67 year old male with history of osteomyelitis of the left lower extremity, diabetes type 2, COPD, hypertension, CVA, pressure cancer who presented with complaint of chronic nonhealing left lower extremity ulcer which was appearing worse with foul smell.  Vascular surgery consulted .  He underwent left posterior tibial artery angioplasty on 6/13.  Vascular surgery cleared for discharge and recommend outpatient follow-up.  Medically stable for discharge home today  Following problems were addressed during the hospitalization:  Left foot ulcer: Chronic nonhealing left foot ulcer, appeared infected. He was previously following with Dr. Lajoyce Corners and underwent amputation of left second toe. He  presented with leukocytosis.  X-ray without evidence of osteomyelitis.  Elevated ESR.  Vascular surgery consulted.  Underwent angiogram..  He underwent left posterior tibial artery angioplasty on 6/13. He will follow-up with vascular surgery as an outpatient with plan for  LLE arterial duplex and ABIs. He still has mild leukocytosis.  Continue doxycycline for next 2 weeks.  Continue aspirin, Plavix, statin PT evaluated him and did not recommend follow-up.  History of CVA/peripheral vascular disease: On aspirin, statin, Plavix.   Uncontrolled diabetes type 2: Recent A1c of 14.3.  Continue current insulin regimen at home.  Monitor blood sugars   Hypertension: On ARB.  Blood pressure stable   Discharge Diagnoses:  Principal Problem:   Foot ulcer, left (HCC) Active Problems:   Hypertension   Uncontrolled type 2 diabetes mellitus with hyperglycemia (HCC)   COPD suggested by initial  evaluation (HCC)   History of CVA (cerebrovascular accident)    Discharge Instructions  Discharge Instructions     Diet - low sodium heart healthy   Complete by: As directed    Discharge instructions   Complete by: As directed    1)Please take prescribed medications and started 2)Follow up with your PCP in a week.  Do a CBC test during the follow-up 3)You will be called by vascular surgery for the follow-up appointment   Increase activity slowly   Complete by: As directed    No wound care   Complete by: As directed       Allergies as of 08/04/2022       Reactions   Penicillins Other (See Comments)   Unknown reaction during childhood        Medication List     STOP taking these medications    amLODipine 10 MG tablet Commonly known as: NORVASC   Ozempic (0.25 or 0.5 MG/DOSE) 2 MG/3ML Sopn Generic drug: Semaglutide(0.25 or 0.5MG /DOS)       TAKE these medications    albuterol 108 (90 Base) MCG/ACT inhaler Commonly known as: VENTOLIN HFA Inhale 2 puffs into the lungs every 6 (six) hours as needed for wheezing or shortness of breath.   aspirin EC 81 MG tablet Take 1 tablet (81 mg total) by mouth daily. Swallow whole.   atorvastatin 40 MG tablet Commonly known as: LIPITOR Take 1 tablet (40 mg total) by mouth daily.   blood glucose meter kit and supplies Dispense based on patient and insurance preference. Use up to four times daily as directed. (FOR ICD-10 E10.9, E11.9).   clopidogrel 75 MG tablet Commonly known as: PLAVIX Take 1 tablet (75 mg total) by mouth daily with breakfast.  Start taking on: August 05, 2022   Dexcom G7 Sensor Misc 1 Units by Does not apply route See admin instructions. Change sensor after 10 days   doxycycline 100 MG capsule Commonly known as: MONODOX Take 1 capsule (100 mg total) by mouth 2 (two) times daily for 14 days.   EPINEPHrine 0.3 mg/0.3 mL Soaj injection Commonly known as: EPI-PEN Inject 0.3 mg into the muscle once as  needed for up to 1 dose (if patient exhibits significant signs and symptoms of allergic reaction.).   gabapentin 300 MG capsule Commonly known as: NEURONTIN Take 1 capsule (300 mg total) by mouth 3 (three) times daily as needed. What changed: reasons to take this   ibuprofen 200 MG tablet Commonly known as: ADVIL Take 400 mg by mouth 2 (two) times daily as needed for headache or moderate pain.   insulin NPH-regular Human (70-30) 100 UNIT/ML injection Commonly known as: NovoLIN 70/30 Inject 10 Units into the skin 2 (two) times daily with a meal. What changed: when to take this   Insulin Syringes (Disposable) U-100 0.3 ML Misc 10 Units by Does not apply route 2 (two) times daily with breakfast and lunch.   metFORMIN 500 MG 24 hr tablet Commonly known as: GLUCOPHAGE-XR Take 500 mg by mouth once daily x1 week, then increase to 500 mg twice daily x1 week, then 1,000 mg in the AM and 500 mg in the PM x1 week, and finally 1,000 mg twice daily. Take with food. What changed:  how much to take how to take this when to take this additional instructions   olmesartan 20 MG tablet Commonly known as: BENICAR Take 1 tablet (20 mg total) by mouth daily.        Follow-up Information     [facility] Follow up in 1 month(s).   Why: The office will call the patient with an appointment Contact information: [address] [place] [address] [phone]        Grant Lade, MD. Schedule an appointment as soon as possible for a visit in 1 week(s).   Specialty: Internal Medicine Contact information: [address] [place] [address] [phone]                Allergies  Allergen Reactions   Penicillins Other (See Comments)    Unknown reaction during childhood    Consultations: Vascular surgery   Procedures/Studies: PERIPHERAL VASCULAR CATHETERIZATION  Result Date: 08/03/2022 Images from the original result were not  included.   Patient name: Grant Yang       MRN: [id number]        DOB: 1955/08/20            Sex: male  08/03/2022 Pre-operative Diagnosis: Critical limb ischemia of the left lower extremity with tissue loss Post-operative diagnosis:  Same Surgeon:  Grant Shelling, MD Procedure Performed: 1.  Ultrasound-guided access right common femoral artery 2.  Catheter selection of left SFA for dedicated angiogram of left lower extremity 3.  Catheter selection of left posterior tibial for dedicated angiogram of left lower extremity 4.  Left posterior tibial artery angioplasty (3 mm x 220 mm Sterling) 5.  Mynx closure of the right common femoral artery 6.  31 minutes of monitored moderate conscious sedation time  Indications: Patient is a 67 year old male seen with nonhealing ischemic ulcer to the left lower extremity.  He presents today for attempted tibial intervention after risks benefits discussed.  Findings:  Ultrasound-guided  access of the right common femoral artery.  Aortogram was deferred as this was performed earlier this week.  Aortic bifurcation was crossed and I got a long 5 French sheath into the left SFA.  We got dedicated images of the left tibial vessels.  With the V18 wire and a quick cross catheter I was able to cannulate the posterior tibial and cross the chronic total occlusion into the ankle.  This was angioplastied with a 3 mm Sterling to nominal pressure for 2 minutes throughout the segment.  Now has two-vessel runoff with widely patent posterior tibial at completion.             Procedure:  The patient was identified in the holding area and taken to room 8.  The patient was then placed supine on the table and prepped and draped in the usual sterile fashion.  A time out was called.  The patient received Versed and fentanyl for conscious moderate sedation.  Vital signs were monitored including heart rate, respiratory rate, oxygenation and blood pressure.  I was present for all of moderate sedation  Ultrasound was used to evaluate the right common femoral artery.  It was patent .  A digital ultrasound image was acquired.  A micropuncture needle was used to access the right common femoral artery under ultrasound guidance.  An 018 wire was advanced without resistance and a micropuncture sheath was placed.  The 018 wire was removed and a benson wire was placed.  The micropuncture sheath was exchanged for a 5 french sheath.  An omniflush catheter was advanced over the wire to the level of L-1.  We crossed the aortic bifurcation and got a wire into the left SFA and then put a long 5 French sheath in the right groin over the aortic bifurcation.  The patient was given 100 units/kg IV heparin.  I then went down and got some dedicated left lower extremity imaging of the tibial vessels to identify the posterior tibial.  I then used a V18 with a quick cross catheter to cannulate the posterior tibial and got a wire down through the chronic total occlusion into the ankle through the plantar arch.  The entire posterior tibial was angioplastied with a 3 mm x 20 mm Sterling for 2 minutes throughout segment.  Excellent results.  Widely patent vessel at completion.  Wires and catheters were removed and a short 5 French sheath was placed in the right groin.  Access shot obtained.  Mynx closure device deployed.  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  Has two-vessel runoff now in the left lower extremity.  Needs dual antiplatelet therapy with aspirin Plavix and statin.     Grant Shelling, MD [facility] Office: [phone]    PERIPHERAL VASCULAR CATHETERIZATION  Result Date: 08/02/2022 Table formatting from the original result was not included. Images from the original result were not included. DATE OF SERVICE: 07/31/2022  PATIENT:  Grant Yang  67 y.o. male  PRE-OPERATIVE DIAGNOSIS:  Atherosclerosis of native arteries  of left lower extremity causing ulceration  POST-OPERATIVE DIAGNOSIS:  Same  PROCEDURE:  1) Ultrasound guided right common femoral artery access 2) Aortogram 3) Left lower extremity angiogram with second order cannulation 4) Additional left lower extremity angiogram with third order cannulation 5) Conscious sedation (23 minutes)  SURGEON:  Rande Brunt. Lenell Antu, MD  ASSISTANT: none  ANESTHESIA:   local and IV sedation  ESTIMATED BLOOD LOSS:  minimal  LOCAL MEDICATIONS USED:  LIDOCAINE  COUNTS: confirmed correct.  PATIENT DISPOSITION:  PACU - hemodynamically stable.  Delay start of Pharmacological VTE agent (>24hrs) due to surgical blood loss or risk of bleeding: no  INDICATION FOR PROCEDURE: Grant Yang is a 67 y.o. male with left foot ischemic ulceration. After careful discussion of risks, benefits, and alternatives the patient was offered angiogram. The patient  understood and wished to proceed.  OPERATIVE FINDINGS: Terminal aorta and iliac arteries: Widely patent renal arteries Terminal aorta patent without stenosis Common, internal and external Iliac arteries patent bilaterally  Left lower extremity: Common femoral artery: patent Profunda femoris artery: patent Superficial femoral artery: patent Popliteal artery: patent Anterior tibial artery: patent at its origin, occludes proximal calf; does not reconstitute Tibioperoneal trunk: patent Peroneal artery: patent to the ankle where it arborizes Posterior tibial artery: occluded at its origin, large vessel reconstitutes about the ankle filling via peroneal and geniculate collaterals Pedal circulation: fills via PT; more robust than expected  GLASS score. FP 0. IP 2. P 0. Stage II.  WIfI score. 2 / 1 / 1. Stage III.  DESCRIPTION OF PROCEDURE: After identification of the patient in the pre-operative holding area, the patient was transferred to the operating room. The patient was positioned supine on the operating room table. Anesthesia was induced. The groins was  prepped and draped in standard fashion. A surgical pause was performed confirming correct patient, procedure, and operative location.  The right groin was anesthetized with subcutaneous injection of 1% lidocaine. Using ultrasound guidance, the right common femoral artery was accessed with micropuncture technique. Fluoroscopy was used to confirm cannulation over the femoral head. The 646F sheath was upsized to 46F.  A Benson wire was advanced into the distal aorta. Over the wire an omni flush catheter was advanced to the level of L2. Aortogram was performed - see above for details.  The left common iliac artery was selected with an omniflush catheter and guidewire. The wire was advanced into the common femoral artery. Over the wire the omni flush catheter was advanced into the external iliac artery. Selective angiography was performed - see above for details. I then advanced a versacore wire into the left popliteal artery and advanced a 100cm straight catheter into the popliteal artery. Angiography of the calf and foot was performed from this catheterization.  The sheath was left in place to be removed in the recovery area.  Conscious sedation was administered with the use of IV fentanyl and midazolam under continuous physician and nurse monitoring.  Heart rate, blood pressure, and oxygen saturation were continuously monitored.  Total sedation time was 23 minutes  Upon completion of the case instrument and sharps counts were confirmed correct. The patient was transferred to the PACU in good condition. I was present for all portions of the procedure.  PLAN: No endovascular solution. Is not healing with peroneal only runoff. Check vein mapping. Left below knee popliteal - posterior tibial bypass would likely allow him to heal.  Rande Brunt. Lenell Antu, MD [facility] Phone Number: [phone] 07/31/2022 11:31 AM    VAS [place] LOWER EXTREMITY SAPHENOUS VEIN MAPPING  Result Date:  07/31/2022 LOWER EXTREMITY VEIN MAPPING Patient Name:  Grant Yang  Date of Exam:   07/31/2022 Medical Rec #: [id number]         Accession #:    [id number] Date of Birth: 11-10-1955          Patient Gender: Judie Petit  Patient Age:   89 years Exam Location:  [facility] Procedure:      VAS [place] LOWER EXTREMITY SAPHENOUS VEIN MAPPING Referring Phys: Heath Lark --------------------------------------------------------------------------------  Indications: ulceration  Comparison Study: No previous study. Performing Technologist: McKayla Maag RVT, [place]  Examination Guidelines: A complete evaluation includes B-mode imaging, spectral Doppler, color Doppler, and power Doppler as needed of all accessible portions of each vessel. Bilateral testing is considered an integral part of a complete examination. Limited examinations for reoccurring indications may be performed as noted. +--------------+-------------+----------------------+--------------+-----------+  RT Diameter   RT Findings          GSV           LT Diameter  LT Findings      (cm)                                             (cm)                 +--------------+-------------+----------------------+--------------+-----------+                    not         Saphenofemoral         0.52                                visualized         Junction                                 +--------------+-------------+----------------------+--------------+-----------+      0.19                      Proximal thigh         0.37                 +--------------+-------------+----------------------+--------------+-----------+      0.17                        Mid thigh            0.32                 +--------------+-------------+----------------------+--------------+-----------+      0.15                       Distal thigh          0.26                 +--------------+-------------+----------------------+--------------+-----------+      0.16                            Knee              0.30                 +--------------+-------------+----------------------+--------------+-----------+      0.25                        Prox calf            0.34      branching  +--------------+-------------+----------------------+--------------+-----------+      0.16  Mid calf            0.23      branching  +--------------+-------------+----------------------+--------------+-----------+      0.21                       Distal calf           0.39                 +--------------+-------------+----------------------+--------------+-----------+ Diagnosing physician: Lemar Livings MD Electronically signed by Lemar Livings MD on 07/31/2022 at 2:32:08 PM.    Final    DG Foot Complete Left  Result Date: 07/30/2022 CLINICAL DATA:  Lateral left foot wound. EXAM: LEFT FOOT - COMPLETE 3+ VIEW COMPARISON:  May 29, 2022 FINDINGS: There is evidence of prior transmetatarsal amputation of the second left toe. A very small nondisplaced fracture deformity is seen involving the proximal tip of the base of the fifth left metatarsal. This represents a new finding when compared to the prior study. There is no evidence of dislocation. A 3.8 cm x 4.5 cm soft tissue defect is seen along the lateral aspect of the mid left foot. This is adjacent to the proximal aspect of the fifth left metatarsal. No adjacent cortical destruction is identified. IMPRESSION: 1. Soft tissue defect along the lateral aspect of the mid left foot, as described above, without evidence of acute osteomyelitis. 2. Very small nondisplaced fracture deformity involving the proximal tip of the base of the fifth left metatarsal. 3. 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 Electronically Signed   By: Aram Candela M.D.   On: 07/30/2022 19:25   VAS [place] ABI WITH/WO TBI  Result Date: 07/12/2022  LOWER EXTREMITY DOPPLER STUDY Patient Name:  Grant Yang  Date  of Exam:   07/12/2022 Medical Rec #: [id number]         Accession #:    [id number] Date of Birth: March 10, 1955          Patient Gender: M Patient Age:   8 years Exam Location:  [facility] Procedure:      VAS [place] ABI WITH/WO TBI Referring Phys: JOSHUA ROBINS --------------------------------------------------------------------------------  Indications: Ulceration. High Risk Factors: Hypertension, Diabetes.  Comparison Study: no prior Performing Technologist: Argentina Ponder RVS  Examination Guidelines: A complete evaluation includes at minimum, Doppler waveform signals and systolic blood pressure reading at the level of bilateral brachial, anterior tibial, and posterior tibial arteries, when vessel segments are accessible. Bilateral testing is considered an integral part of a complete examination. Photoelectric Plethysmograph (PPG) waveforms and toe systolic pressure readings are included as required and additional duplex testing as needed. Limited examinations for reoccurring indications may be performed as noted.  ABI Findings: +---------+------------------+-----+---------+--------+ Right    Rt Pressure (mmHg)IndexWaveform Comment  +---------+------------------+-----+---------+--------+ Brachial 109                                      +---------+------------------+-----+---------+--------+ PTA      159               1.42 triphasic         +---------+------------------+-----+---------+--------+ DP       107               0.96 biphasic          +---------+------------------+-----+---------+--------+ Shawna Clamp  0.67 Abnormal          +---------+------------------+-----+---------+--------+ +---------+------------------+-----+--------+-------+ Left     Lt Pressure (mmHg)IndexWaveformComment +---------+------------------+-----+--------+-------+ Brachial 112                                    +---------+------------------+-----+--------+-------+ PTA       105               0.94 biphasic        +---------+------------------+-----+--------+-------+ DP       120               1.07 biphasic        +---------+------------------+-----+--------+-------+ Great Toe43                0.38 Abnormal        +---------+------------------+-----+--------+-------+ +-------+-----------+-----------+------------+------------+ ABI/TBIToday's ABIToday's TBIPrevious ABIPrevious TBI +-------+-----------+-----------+------------+------------+ Right  1.42       0.67                                +-------+-----------+-----------+------------+------------+ Left   1.07       0.38                                +-------+-----------+-----------+------------+------------+  Summary: Right: Resting right ankle-brachial index indicates noncompressible right lower extremity arteries. The right toe-brachial index is abnormal. Left: Resting left ankle-brachial index is within normal range. The left toe-brachial index is abnormal. *See table(s) above for measurements and observations.  Electronically signed by Lemar Livings MD on 07/12/2022 at 5:03:59 PM.    Final       Subjective: Patient seen and examined at bedside today.  Hemodynamically stable for discharge.  Discharge Exam: Vitals:   08/04/22 0419 08/04/22 0856  BP: 112/69 125/64  Pulse: 69 72  Resp: 19 20  Temp: 98.2 F (36.8 C) 98.2 F (36.8 C)  SpO2: 96% 100%   Vitals:   08/03/22 1950 08/03/22 2350 08/04/22 0419 08/04/22 0856  BP: 118/69 122/62 112/69 125/64  Pulse: 68 64 69 72  Resp: 19 20 19 20   Temp: 98.5 F (36.9 C) 98.4 F (36.9 C) 98.2 F (36.8 C) 98.2 F (36.8 C)  TempSrc: Oral Oral Oral Oral  SpO2: 98% 98% 96% 100%  Weight:      Height:        General: Pt is alert, awake, not in acute distress Cardiovascular: RRR, S1/S2 +, no rubs, no gallops Respiratory: CTA bilaterally, no wheezing, no rhonchi Abdominal: Soft, NT, ND, bowel sounds + Extremities: no edema, no  cyanosis    The results of significant diagnostics from this hospitalization (including imaging, microbiology, ancillary and laboratory) are listed below for reference.     Microbiology: No results found for this or any previous visit (from the past 240 hour(s)).   Labs: BNP (last 3 results) No results for input(s): "BNP" in the last 8760 hours. Basic Metabolic Panel: Recent Labs  Lab 07/30/22 1845 07/31/22 0637 08/01/22 0133 08/03/22 0100 08/04/22 0115  NA 135 134* 133*  --  133*  K 3.8 3.7 4.6  --  4.1  CL 98 102 100  --  100  CO2 27 22 25   --  25  GLUCOSE 95 103* 160*  --  202*  BUN 33* 20 17  --  14  CREATININE 1.15  0.78 0.84 0.93 0.87  CALCIUM 9.7 9.2 9.1  --  8.9  MG  --   --  1.8  --   --    Liver Function Tests: No results for input(s): "AST", "ALT", "ALKPHOS", "BILITOT", "PROT", "ALBUMIN" in the last 168 hours. No results for input(s): "LIPASE", "AMYLASE" in the last 168 hours. No results for input(s): "AMMONIA" in the last 168 hours. CBC: Recent Labs  Lab 07/30/22 1845 07/31/22 0637 08/01/22 0133 08/02/22 0044 08/03/22 0100 08/04/22 0115  WBC 20.0* 17.5* 16.1* 16.9* 16.7* 17.4*  NEUTROABS 15.4*  --   --   --   --   --   HGB 13.3 12.2* 12.1* 11.5* 11.8* 11.7*  HCT 39.8 36.6* 36.9* 34.6* 36.0* 34.6*  MCV 82.2 82.6 82.0 81.0 80.9 82.4  PLT 470* 397 376 398 404* 383   Cardiac Enzymes: No results for input(s): "CKTOTAL", "CKMB", "CKMBINDEX", "TROPONINI" in the last 168 hours. BNP: Invalid input(s): "POCBNP" CBG: Recent Labs  Lab 08/03/22 0533 08/03/22 1204 08/03/22 1838 08/03/22 2114 08/04/22 0612  GLUCAP 164* 195* 182* 201* 191*   D-Dimer No results for input(s): "DDIMER" in the last 72 hours. Hgb A1c No results for input(s): "HGBA1C" in the last 72 hours. Lipid Profile No results for input(s): "CHOL", "HDL", "LDLCALC", "TRIG", "CHOLHDL", "LDLDIRECT" in the last 72 hours. Thyroid function studies No results for input(s): "TSH", "T4TOTAL",  "T3FREE", "THYROIDAB" in the last 72 hours.  Invalid input(s): "FREET3" Anemia work up No results for input(s): "VITAMINB12", "FOLATE", "FERRITIN", "TIBC", "IRON", "RETICCTPCT" in the last 72 hours. Urinalysis    Component Value Date/Time   COLORURINE YELLOW 12/28/2010 0813   APPEARANCEUR CLEAR 12/28/2010 0813   LABSPEC 1.025 12/28/2010 0813   PHURINE 5.0 12/28/2010 0813   GLUCOSEU >1000 (A) 12/28/2010 0813   HGBUR TRACE (A) 12/28/2010 0813   BILIRUBINUR NEGATIVE 12/28/2010 0813   KETONESUR NEGATIVE 12/28/2010 0813   PROTEINUR 100 (A) 12/28/2010 0813   UROBILINOGEN 0.2 12/28/2010 0813   NITRITE NEGATIVE 12/28/2010 0813   LEUKOCYTESUR NEGATIVE 12/28/2010 0813   Sepsis Labs Recent Labs  Lab 08/01/22 0133 08/02/22 0044 08/03/22 0100 08/04/22 0115  WBC 16.1* 16.9* 16.7* 17.4*   Microbiology No results found for this or any previous visit (from the past 240 hour(s)).  Please note: You were cared for by a hospitalist during your hospital stay. Once you are discharged, your primary care physician will handle any further medical issues. Please note that NO REFILLS for any discharge medications will be authorized once you are discharged, as it is imperative that you return to your primary care physician (or establish a relationship with a primary care physician if you do not have one) for your post hospital discharge needs so that they can reassess your need for medications and monitor your lab values.    Time coordinating discharge: 40 minutes  SIGNED:   Burnadette Pop, MD  [facility] 08/04/2022, 11:37 AM Pager [phone]  If 7PM-7AM, please contact night-coverage [email or web address] Password [email or web address]

## 2022-08-07 ENCOUNTER — Telehealth: Payer: Self-pay

## 2022-08-07 NOTE — Transitions of Care (Post Inpatient/ED Visit) (Signed)
   08/07/2022  Name: Grant Yang MRN: [id number] DOB: 01-11-1956  Today's TOC FU Call Status: Today's TOC FU Call Status:: Unsuccessul Call (1st Attempt) Unsuccessful Call (1st Attempt) Date: 08/07/22  Attempted to reach the patient regarding the most recent Inpatient/ED visit.  Follow Up Plan: Additional outreach attempts will be made to reach the patient to complete the Transitions of Care (Post Inpatient/ED visit) call.   Jodelle Gross, RN, BSN, CCM Care Management Coordinator [place]/[facility]

## 2022-08-08 ENCOUNTER — Other Ambulatory Visit: Payer: Self-pay

## 2022-08-08 ENCOUNTER — Telehealth: Payer: Self-pay

## 2022-08-08 ENCOUNTER — Telehealth: Payer: Self-pay | Admitting: Internal Medicine

## 2022-08-08 MED ORDER — TIRZEPATIDE 2.5 MG/0.5ML ~~LOC~~ SOAJ: 2.5000 mg | SUBCUTANEOUS | 0 refills | Status: DC

## 2022-08-08 NOTE — Transitions of Care (Post Inpatient/ED Visit) (Signed)
   08/08/2022  Name: Grant Yang MRN: [id number] DOB: [birth year]  Today's TOC FU Call Status: Today's TOC FU Call Status:: Unsuccessful Call (2nd Attempt) Unsuccessful Call (2nd Attempt) Date: 08/08/22  Attempted to reach the patient regarding the most recent Inpatient/ED visit.  Follow Up Plan: Additional outreach attempts will be made to reach the patient to complete the Transitions of Care (Post Inpatient/ED visit) call.   Jodelle Gross, RN, BSN, CCM Care Management Coordinator [place]/[facility] Phone: [phone]/Fax: [phone]

## 2022-08-08 NOTE — Telephone Encounter (Signed)
Patient called said last week patient was in hospital and said the hospital stopped his metformin, patient is low on his insulin going to [facility] to get it over the counter. (Per patient does not want to be on the metformin heard some bad things about this medication)  Patient said the ozempic will not pay for it through his insurance. Patient said alternative  monunjaro medicine would cover by his insurance.  Pharmacy: [facility]

## 2022-08-09 ENCOUNTER — Telehealth: Payer: Self-pay

## 2022-08-09 NOTE — Transitions of Care (Post Inpatient/ED Visit) (Signed)
   08/09/2022  Name: Grant Yang MRN: [id number] DOB: 12-23-1955  Today's TOC FU Call Status: Today's TOC FU Call Status:: Unsuccessful Call (3rd Attempt) Unsuccessful Call (3rd Attempt) Date: 08/09/22  Attempted to reach the patient regarding the most recent Inpatient/ED visit.  Follow Up Plan: No further outreach attempts will be made at this time. We have been unable to contact the patient.  Jodelle Gross, RN, BSN, CCM Care Management Coordinator [place]/[facility]

## 2022-08-09 NOTE — Telephone Encounter (Signed)
LVM stating that medication was called in.

## 2022-08-16 ENCOUNTER — Ambulatory Visit: Payer: 59 | Admitting: Internal Medicine

## 2022-08-16 ENCOUNTER — Encounter: Payer: Self-pay | Admitting: Internal Medicine

## 2022-08-16 VITALS — BP 110/64 | HR 72 | Ht 71.0 in | Wt 197.8 lb

## 2022-08-16 DIAGNOSIS — I739 Peripheral vascular disease, unspecified: Secondary | ICD-10-CM | POA: Diagnosis not present

## 2022-08-16 DIAGNOSIS — L97528 Non-pressure chronic ulcer of other part of left foot with other specified severity: Secondary | ICD-10-CM | POA: Diagnosis not present

## 2022-08-16 DIAGNOSIS — E1165 Type 2 diabetes mellitus with hyperglycemia: Secondary | ICD-10-CM | POA: Diagnosis not present

## 2022-08-16 DIAGNOSIS — Z09 Encounter for follow-up examination after completed treatment for conditions other than malignant neoplasm: Secondary | ICD-10-CM

## 2022-08-16 MED ORDER — CIPROFLOXACIN HCL 500 MG PO TABS
500.0000 mg | ORAL_TABLET | Freq: Two times a day (BID) | ORAL | 0 refills | Status: AC
Start: 2022-08-16 — End: 2022-08-26

## 2022-08-16 NOTE — Assessment & Plan Note (Signed)
Hospital chart reviewed, including discharge summary Medications reconciled and reviewed with the patient in detail 

## 2022-08-16 NOTE — Assessment & Plan Note (Addendum)
S/p angioplasty in LLE On aspirin, Plavix and statin now Needs to follow up with vascular surgery

## 2022-08-16 NOTE — Progress Notes (Signed)
Established Patient Office Visit  Subjective:  Patient ID: Grant Yang, male    DOB: 1955/08/31  Age: 67 y.o. MRN: [id number]  CC:  Chief Complaint  Patient presents with   Hospitalization Follow-up    Hospital follow up     HPI Grant Yang is a 67 y.o. male with past medical history of osteomyelitis of the left lower extremity, diabetes type 2, COPD, hypertension, CVA, pressure ulcer who presents for follow-up after recent hospitalization from 07/30/22-08/04/22.  He presented with complaint of chronic nonhealing left lower extremity ulcer which was appearing worse with foul smell.  Vascular surgery consulted .  He underwent left posterior tibial artery angioplasty on 6/13.  Vascular surgery cleared for discharge and recommend outpatient follow-up.  He was discharged with oral doxycycline.  He still complains of foul-smelling wound, which is nonhealing.  He is currently taking aspirin, Plavix and statin after the angioplasty.  He was previously following with Dr. Lajoyce Corners and underwent amputation of left second toe.  He has history of uncontrolled type II DM.  He has not been able to start [facility] yet.  He has stopped taking metformin as well is he thinks that it caused him to have foot ulcer.  Despite counseling, he prefers to avoid metformin.   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 [place] in 2010 . denies any residual deficits     Past Surgical History:  Procedure Laterality Date   ABDOMINAL AORTOGRAM W/LOWER EXTREMITY N/A 07/31/2022   Procedure: ABDOMINAL AORTOGRAM W/LOWER  EXTREMITY;  Surgeon: Leonie Douglas, MD;  Location: [facility] INVASIVE CV LAB;  Service: Cardiovascular;  Laterality: N/A;   AMPUTATION Left 05/31/2022   Procedure: LEFT 2ND TOE AMPUTATION;  Surgeon: Nadara Mustard, MD;  Location: [facility] OR;  Service: Orthopedics;  Laterality: Left;   APPENDECTOMY     CHOLECYSTECTOMY  12/30/2010   Procedure: LAPAROSCOPIC CHOLECYSTECTOMY;  Surgeon: Dalia Heading;  Location: [facility] ORS;  Service: General;  Laterality: N/A;   I & D EXTREMITY Left 05/31/2022   Procedure: LEFT FOOT DEBRIDEMENT;  Surgeon: Nadara Mustard, MD;  Location: [facility] OR;  Service: Orthopedics;  Laterality: Left;   INCISION AND DRAINAGE ABSCESS Left 02/01/2017   Procedure: INCISION AND DRAINAGE ABSCESS LEFT HAND;  Surgeon: Vickki Hearing, MD;  Location: [facility] ORS;  Service: Orthopedics;  Laterality: Left;   LOWER EXTREMITY ANGIOGRAPHY N/A 08/03/2022   Procedure: Lower Extremity Angiography;  Surgeon: Cephus Shelling, MD;  Location: [facility] INVASIVE CV LAB;  Service: Cardiovascular;  Laterality: N/A;   LYMPHADENECTOMY Bilateral 08/02/2017   Procedure: LYMPHADENECTOMY, PELVIC;  Surgeon: Heloise Purpura, MD;  Location: [facility] ORS;  Service: Urology;  Laterality: Bilateral;   PERIPHERAL VASCULAR BALLOON ANGIOPLASTY  08/03/2022   Procedure: PERIPHERAL VASCULAR BALLOON ANGIOPLASTY;  Surgeon: Cephus Shelling, MD;  Location: [facility] INVASIVE CV LAB;  Service: Cardiovascular;;   ROBOT ASSISTED LAPAROSCOPIC RADICAL PROSTATECTOMY N/A 08/02/2017   Procedure: XI ROBOTIC ASSISTED LAPAROSCOPIC RADICAL PROSTATECTOMY LEVEL 2;  Surgeon: Heloise Purpura, MD;  Location: [facility] ORS;  Service: Urology;  Laterality: N/A;    Family History  Problem Relation Age of Onset   Heart failure Mother    Cancer Father     Social History   Socioeconomic History   Marital status: Divorced    Spouse name: Not on file   Number of children: Not on file   Years of education: Not on file   Highest education level: Not on file  Occupational History    Not on file  Tobacco Use   Smoking status: Former    Packs/day: 2.00    Years: 38.00    Additional pack years: 0.00    Total pack years: 76.00    Types: Cigarettes    Start date: 69    Quit date: 2010    Years since quitting: 14.4   Smokeless tobacco: Never  Substance and Sexual Activity   Alcohol use: No   Drug use: Yes    Types: Marijuana    Comment: occ   Sexual activity: Yes  Other Topics Concern   Not on file  Social History Narrative   Not on file   Social Determinants of Health   Financial Resource Strain: Not on file  Food Insecurity: No Food Insecurity (07/30/2022)   Hunger Vital Sign    Worried About Running Out of Food in the Last Year: Never true    Ran Out of Food in the Last Year: Never true  Transportation Needs: No Transportation Needs (07/30/2022)   PRAPARE - Administrator, Civil Service (Medical): No    Lack of Transportation (Non-Medical): No  Physical Activity: Not on file  Stress: Not on file  Social Connections: Not on file  Intimate Partner Violence: Not At Risk (07/30/2022)   Humiliation, Afraid, Rape, and Kick questionnaire    Fear of Current or Ex-Partner: No    Emotionally Abused: No    Physically Abused: No    Sexually Abused: No    Outpatient Medications Prior to Visit  Medication Sig Dispense Refill   tirzepatide (MOUNJARO) 2.5 MG/0.5ML Pen Inject 2.5 mg into the skin once a week. 2 mL 0   albuterol (VENTOLIN HFA) 108 (90 Base) MCG/ACT inhaler Inhale 2 puffs into the lungs every 6 (six) hours as needed for wheezing or shortness of breath. 8 g 0   aspirin EC 81 MG tablet Take 1 tablet (81 mg total) by mouth daily. Swallow whole. 30 tablet 12   atorvastatin (LIPITOR) 40 MG tablet Take 1 tablet (40 mg total) by mouth daily. (Patient not taking: Reported on 07/31/2022) 90 tablet 3   blood glucose meter kit and supplies Dispense based on patient and insurance preference. Use up to four times daily as directed. (FOR ICD-10 E10.9,  E11.9). 1 each 0   clopidogrel (PLAVIX) 75 MG tablet Take 1 tablet (75 mg total) by mouth daily with breakfast. 30 tablet 3   Continuous Blood Gluc Sensor (DEXCOM G7 SENSOR) MISC 1 Units by Does not apply route See admin instructions. Change sensor after 10 days 3 each 2   doxycycline (VIBRA-TABS) 100 MG tablet Take 1 tablet (100 mg total) by mouth 2 (two) times daily for 14 days. 28 tablet 0   EPINEPHrine 0.3 mg/0.3 mL IJ SOAJ injection Inject 0.3 mg into the muscle once as needed for up to 1 dose (if patient exhibits significant signs and symptoms of allergic reaction.). (Patient not taking: Reported on 07/31/2022) 1 each 0   gabapentin (NEURONTIN) 300 MG capsule  Take 1 capsule (300 mg total) by mouth 3 (three) times daily as needed. (Patient taking differently: Take 300 mg by mouth 3 (three) times daily as needed (Neuropathy).) 180 capsule 1   ibuprofen (ADVIL) 200 MG tablet Take 400 mg by mouth 2 (two) times daily as needed for headache or moderate pain.     insulin NPH-regular Human (NOVOLIN 70/30) (70-30) 100 UNIT/ML injection Inject 10 Units into the skin 2 (two) times daily with a meal. (Patient taking differently: Inject 10 Units into the skin with breakfast, with lunch, and with evening meal.) 10 mL 1   Insulin Syringes, Disposable, U-100 0.3 ML MISC 10 Units by Does not apply route 2 (two) times daily with breakfast and lunch. 60 each 0   metFORMIN (GLUCOPHAGE-XR) 500 MG 24 hr tablet Take 500 mg by mouth once daily x1 week, then increase to 500 mg twice daily x1 week, then 1,000 mg in the AM and 500 mg in the PM x1 week, and finally 1,000 mg twice daily. Take with food. (Patient taking differently: Take 1,000 mg by mouth 2 (two) times daily.) 90 tablet 1   olmesartan (BENICAR) 20 MG tablet Take 1 tablet (20 mg total) by mouth daily. 30 tablet 2   No facility-administered medications prior to visit.    Allergies  Allergen Reactions   Penicillins Other (See Comments)    Unknown reaction  during childhood    ROS Review of Systems  Constitutional:  Positive for fatigue. Negative for chills and fever.  HENT:  Negative for congestion and sore throat.   Eyes:  Negative for pain and discharge.  Respiratory:  Negative for cough and shortness of breath.   Cardiovascular:  Negative for chest pain and palpitations.  Gastrointestinal:  Negative for diarrhea, nausea and vomiting.  Endocrine: Negative for polydipsia and polyuria.  Genitourinary:  Negative for dysuria and hematuria.  Musculoskeletal:  Negative for neck pain and neck stiffness.  Skin:  Positive for wound. Negative for rash.  Neurological:  Negative for dizziness, weakness and numbness.  Psychiatric/Behavioral:  Negative for agitation and behavioral problems.       Objective:    Physical Exam Vitals reviewed.  Constitutional:      General: He is not in acute distress.    Appearance: He is not diaphoretic.  HENT:     Head: Normocephalic and atraumatic.     Mouth/Throat:     Mouth: Mucous membranes are moist.  Eyes:     General: No scleral icterus.    Extraocular Movements: Extraocular movements intact.  Cardiovascular:     Rate and Rhythm: Normal rate and regular rhythm.     Heart sounds: Normal heart sounds. No murmur heard. Pulmonary:     Breath sounds: Normal breath sounds. No wheezing or rales.  Musculoskeletal:     Cervical back: Neck supple. No tenderness.     Left lower leg: Edema present.  Skin:    General: Skin is warm.     Comments: Left foot, nonhealing ulcers as seen in image  Neurological:     General: No focal deficit present.     Mental Status: He is alert and oriented to person, place, and time.     Sensory: No sensory deficit.     Motor: No weakness.  Psychiatric:        Mood and Affect: Mood normal.        Behavior: Behavior normal.      Media Information   Document Information  Photos  Left  foot ulcer  08/16/2022 14:01  Attached To:  Office Visit on 08/16/22 with  Anabel Halon, MD  Source Information  Anabel Halon, MD  [facility]       BP 110/64 (BP Location: Left Arm, Patient Position: Sitting, Cuff Size: Normal)   Pulse 72   Ht 5\' 11"  (1.803 m)   Wt 197 lb 12.8 oz (89.7 kg)   SpO2 98%   BMI 27.59 kg/m  Wt Readings from Last 3 Encounters:  08/16/22 197 lb 12.8 oz (89.7 kg)  07/30/22 208 lb (94.3 kg)  07/26/22 208 lb (94.3 kg)    No results found for: "TSH" Lab Results  Component Value Date   WBC 17.4 (H) 08/04/2022   HGB 11.7 (L) 08/04/2022   HCT 34.6 (L) 08/04/2022   MCV 82.4 08/04/2022   PLT 383 08/04/2022   Lab Results  Component Value Date   NA 133 (L) 08/04/2022   K 4.1 08/04/2022   CO2 25 08/04/2022   GLUCOSE 202 (H) 08/04/2022   BUN 14 08/04/2022   CREATININE 0.87 08/04/2022   BILITOT 0.9 05/29/2022   ALKPHOS 75 05/29/2022   AST 13 (L) 05/29/2022   ALT 14 05/29/2022   PROT 7.3 05/29/2022   ALBUMIN 3.3 (L) 05/29/2022   CALCIUM 8.9 08/04/2022   ANIONGAP 8 08/04/2022   No results found for: "CHOL" No results found for: "HDL" No results found for: "LDLCALC" No results found for: "TRIG" No results found for: "CHOLHDL" Lab Results  Component Value Date   HGBA1C 14.3 (H) 05/29/2022      Assessment & Plan:   Problem List Items Addressed This Visit       Cardiovascular and Mediastinum   PAD (peripheral artery disease) (HCC)    S/p angioplasty in LLE On aspirin, Plavix and statin now Needs to follow up with vascular surgery        Endocrine   Uncontrolled type 2 diabetes mellitus with hyperglycemia (HCC)    Lab Results  Component Value Date   HGBA1C 14.3 (H) 05/29/2022   Uncontrolled On Novolin 70/30 10 units twice daily Has stopped taking metformin and does not prefer to start it again despite counseling Has not been able to start Mounjaro yet Advised to follow diabetic diet F/u CMP and lipid panel Has nonhealing diabetic foot ulcer Diabetic eye exam: Advised to follow up  with Ophthalmology for diabetic eye exam        Other   Foot ulcer, left (HCC) - Primary    Chronic nonhealing left foot ulcer, appears infected Currently on doxycycline, was given IV vancomycin and cefepime in the hospital Since he has foul-smelling discharge from the wound, needs proper wound care-referred to wound care clinic Added ciprofloxacin for coverage for Pseudomonas      Relevant Medications   ciprofloxacin (CIPRO) 500 MG tablet   Other Relevant Orders   AMB referral to wound care center   CBC with Differential/Platelet   CMP14+EGFR   Hospital discharge follow-up    Hospital chart reviewed, including discharge summary Medications reconciled and reviewed with the patient in detail       Meds ordered this encounter  Medications   ciprofloxacin (CIPRO) 500 MG tablet    Sig: Take 1 tablet (500 mg total) by mouth 2 (two) times daily for 10 days.    Dispense:  20 tablet    Refill:  0    Follow-up: Return if symptoms worsen or fail to improve.    Kirstein Baxley K  Posey Pronto, MD

## 2022-08-16 NOTE — Assessment & Plan Note (Signed)
Chronic nonhealing left foot ulcer, appears infected Currently on doxycycline, was given IV vancomycin and cefepime in the hospital Since he has foul-smelling discharge from the wound, needs proper wound care-referred to wound care clinic Added ciprofloxacin for coverage for Pseudomonas

## 2022-08-16 NOTE — Patient Instructions (Addendum)
Please start taking Ciprofloxacin as prescribed.  You are being referred to Wound care clinic.

## 2022-08-16 NOTE — Assessment & Plan Note (Signed)
Lab Results  Component Value Date   HGBA1C 14.3 (H) 05/29/2022   Uncontrolled On Novolin 70/30 10 units twice daily Has stopped taking metformin and does not prefer to start it again despite counseling Has not been able to start [facility] yet Advised to follow diabetic diet F/u CMP and lipid panel Has nonhealing diabetic foot ulcer Diabetic eye exam: Advised to follow up with Ophthalmology for diabetic eye exam

## 2022-08-17 LAB — CBC WITH DIFFERENTIAL/PLATELET
Basophils Absolute: 0.1 10*3/uL (ref 0.0–0.2)
Basos: 1 %
EOS (ABSOLUTE): 0.8 10*3/uL — ABNORMAL HIGH (ref 0.0–0.4)
Eos: 6 %
Hematocrit: 37.1 % — ABNORMAL LOW (ref 37.5–51.0)
Hemoglobin: 12.1 g/dL — ABNORMAL LOW (ref 13.0–17.7)
Immature Grans (Abs): 0.1 10*3/uL (ref 0.0–0.1)
Immature Granulocytes: 1 %
Lymphocytes Absolute: 2.7 10*3/uL (ref 0.7–3.1)
Lymphs: 19 %
MCH: 27 pg (ref 26.6–33.0)
MCHC: 32.6 g/dL (ref 31.5–35.7)
MCV: 83 fL (ref 79–97)
Monocytes Absolute: 1.1 10*3/uL — ABNORMAL HIGH (ref 0.1–0.9)
Monocytes: 8 %
Neutrophils Absolute: 9.2 10*3/uL — ABNORMAL HIGH (ref 1.4–7.0)
Neutrophils: 65 %
Platelets: 433 10*3/uL (ref 150–450)
RBC: 4.48 x10E6/uL (ref 4.14–5.80)
RDW: 13.5 % (ref 11.6–15.4)
WBC: 13.9 10*3/uL — ABNORMAL HIGH (ref 3.4–10.8)

## 2022-08-17 LAB — CMP14+EGFR
ALT: 14 IU/L (ref 0–44)
AST: 11 IU/L (ref 0–40)
Albumin: 3.8 g/dL — ABNORMAL LOW (ref 3.9–4.9)
Alkaline Phosphatase: 99 IU/L (ref 44–121)
BUN/Creatinine Ratio: 33 — ABNORMAL HIGH (ref 10–24)
BUN: 30 mg/dL — ABNORMAL HIGH (ref 8–27)
Bilirubin Total: 0.5 mg/dL (ref 0.0–1.2)
CO2: 23 mmol/L (ref 20–29)
Calcium: 9.6 mg/dL (ref 8.6–10.2)
Chloride: 102 mmol/L (ref 96–106)
Creatinine, Ser: 0.9 mg/dL (ref 0.76–1.27)
Globulin, Total: 3.2 g/dL (ref 1.5–4.5)
Glucose: 121 mg/dL — ABNORMAL HIGH (ref 70–99)
Potassium: 4.6 mmol/L (ref 3.5–5.2)
Sodium: 138 mmol/L (ref 134–144)
Total Protein: 7 g/dL (ref 6.0–8.5)
eGFR: 95 mL/min/{1.73_m2} (ref >59)

## 2022-08-25 ENCOUNTER — Other Ambulatory Visit: Payer: Self-pay | Admitting: *Deleted

## 2022-08-25 DIAGNOSIS — I70245 Atherosclerosis of native arteries of left leg with ulceration of other part of foot: Secondary | ICD-10-CM

## 2022-09-04 ENCOUNTER — Encounter: Payer: Self-pay | Admitting: Internal Medicine

## 2022-09-04 ENCOUNTER — Inpatient Hospital Stay (HOSPITAL_COMMUNITY)
Admission: EM | Admit: 2022-09-04 | Discharge: 2022-09-04 | DRG: 300 | Discharge status: Against Medical Advice | Payer: Medicare Other | Attending: Internal Medicine | Admitting: Internal Medicine

## 2022-09-04 ENCOUNTER — Other Ambulatory Visit: Payer: Self-pay

## 2022-09-04 ENCOUNTER — Emergency Department (HOSPITAL_COMMUNITY): Payer: Medicare Other

## 2022-09-04 ENCOUNTER — Encounter (HOSPITAL_COMMUNITY): Payer: Self-pay | Admitting: Emergency Medicine

## 2022-09-04 ENCOUNTER — Ambulatory Visit (INDEPENDENT_AMBULATORY_CARE_PROVIDER_SITE_OTHER): Payer: Medicare Other | Admitting: Internal Medicine

## 2022-09-04 VITALS — BP 93/57 | HR 89 | Ht 71.0 in | Wt 196.0 lb

## 2022-09-04 DIAGNOSIS — Z8546 Personal history of malignant neoplasm of prostate: Secondary | ICD-10-CM | POA: Diagnosis not present

## 2022-09-04 DIAGNOSIS — B964 Proteus (mirabilis) (morganii) as the cause of diseases classified elsewhere: Secondary | ICD-10-CM | POA: Diagnosis not present

## 2022-09-04 DIAGNOSIS — K219 Gastro-esophageal reflux disease without esophagitis: Secondary | ICD-10-CM | POA: Diagnosis present

## 2022-09-04 DIAGNOSIS — B958 Unspecified staphylococcus as the cause of diseases classified elsewhere: Secondary | ICD-10-CM | POA: Diagnosis present

## 2022-09-04 DIAGNOSIS — E1169 Type 2 diabetes mellitus with other specified complication: Secondary | ICD-10-CM | POA: Diagnosis present

## 2022-09-04 DIAGNOSIS — Z7982 Long term (current) use of aspirin: Secondary | ICD-10-CM | POA: Diagnosis not present

## 2022-09-04 DIAGNOSIS — Z91148 Patient's other noncompliance with medication regimen for other reason: Secondary | ICD-10-CM | POA: Diagnosis not present

## 2022-09-04 DIAGNOSIS — Z7984 Long term (current) use of oral hypoglycemic drugs: Secondary | ICD-10-CM | POA: Diagnosis not present

## 2022-09-04 DIAGNOSIS — Z7985 Long-term (current) use of injectable non-insulin antidiabetic drugs: Secondary | ICD-10-CM | POA: Diagnosis not present

## 2022-09-04 DIAGNOSIS — J449 Chronic obstructive pulmonary disease, unspecified: Secondary | ICD-10-CM | POA: Diagnosis present

## 2022-09-04 DIAGNOSIS — L97529 Non-pressure chronic ulcer of other part of left foot with unspecified severity: Secondary | ICD-10-CM | POA: Diagnosis present

## 2022-09-04 DIAGNOSIS — L03116 Cellulitis of left lower limb: Secondary | ICD-10-CM | POA: Diagnosis present

## 2022-09-04 DIAGNOSIS — I739 Peripheral vascular disease, unspecified: Secondary | ICD-10-CM | POA: Diagnosis present

## 2022-09-04 DIAGNOSIS — Z8673 Personal history of transient ischemic attack (TIA), and cerebral infarction without residual deficits: Secondary | ICD-10-CM | POA: Diagnosis not present

## 2022-09-04 DIAGNOSIS — M86172 Other acute osteomyelitis, left ankle and foot: Secondary | ICD-10-CM | POA: Diagnosis not present

## 2022-09-04 DIAGNOSIS — M869 Osteomyelitis, unspecified: Secondary | ICD-10-CM

## 2022-09-04 DIAGNOSIS — E1151 Type 2 diabetes mellitus with diabetic peripheral angiopathy without gangrene: Secondary | ICD-10-CM | POA: Diagnosis not present

## 2022-09-04 DIAGNOSIS — E1140 Type 2 diabetes mellitus with diabetic neuropathy, unspecified: Secondary | ICD-10-CM | POA: Diagnosis present

## 2022-09-04 DIAGNOSIS — Z5329 Procedure and treatment not carried out because of patient's decision for other reasons: Secondary | ICD-10-CM | POA: Diagnosis not present

## 2022-09-04 DIAGNOSIS — L97528 Non-pressure chronic ulcer of other part of left foot with other specified severity: Secondary | ICD-10-CM

## 2022-09-04 DIAGNOSIS — Z79899 Other long term (current) drug therapy: Secondary | ICD-10-CM

## 2022-09-04 DIAGNOSIS — Z1611 Resistance to penicillins: Secondary | ICD-10-CM | POA: Diagnosis present

## 2022-09-04 DIAGNOSIS — I10 Essential (primary) hypertension: Secondary | ICD-10-CM | POA: Diagnosis present

## 2022-09-04 DIAGNOSIS — Z89422 Acquired absence of other left toe(s): Secondary | ICD-10-CM

## 2022-09-04 DIAGNOSIS — Z880 Allergy status to penicillin: Secondary | ICD-10-CM

## 2022-09-04 DIAGNOSIS — Z8249 Family history of ischemic heart disease and other diseases of the circulatory system: Secondary | ICD-10-CM

## 2022-09-04 DIAGNOSIS — E1165 Type 2 diabetes mellitus with hyperglycemia: Secondary | ICD-10-CM | POA: Diagnosis not present

## 2022-09-04 DIAGNOSIS — S91302A Unspecified open wound, left foot, initial encounter: Secondary | ICD-10-CM | POA: Diagnosis not present

## 2022-09-04 DIAGNOSIS — Z7902 Long term (current) use of antithrombotics/antiplatelets: Secondary | ICD-10-CM

## 2022-09-04 DIAGNOSIS — Z87891 Personal history of nicotine dependence: Secondary | ICD-10-CM | POA: Diagnosis not present

## 2022-09-04 DIAGNOSIS — R600 Localized edema: Secondary | ICD-10-CM | POA: Diagnosis not present

## 2022-09-04 DIAGNOSIS — L089 Local infection of the skin and subcutaneous tissue, unspecified: Principal | ICD-10-CM

## 2022-09-04 LAB — CBC WITH DIFFERENTIAL/PLATELET
Abs Immature Granulocytes: 0.11 10*3/uL — ABNORMAL HIGH (ref 0.00–0.07)
Basophils Absolute: 0.1 10*3/uL (ref 0.0–0.1)
Basophils Relative: 0 %
Eosinophils Absolute: 0.3 10*3/uL (ref 0.0–0.5)
Eosinophils Relative: 2 %
HCT: 35.5 % — ABNORMAL LOW (ref 39.0–52.0)
Hemoglobin: 11.3 g/dL — ABNORMAL LOW (ref 13.0–17.0)
Immature Granulocytes: 1 %
Lymphocytes Relative: 8 %
Lymphs Abs: 1.5 10*3/uL (ref 0.7–4.0)
MCH: 26.8 pg (ref 26.0–34.0)
MCHC: 31.8 g/dL (ref 30.0–36.0)
MCV: 84.1 fL (ref 80.0–100.0)
Monocytes Absolute: 1.8 10*3/uL — ABNORMAL HIGH (ref 0.1–1.0)
Monocytes Relative: 9 %
Neutro Abs: 16.1 10*3/uL — ABNORMAL HIGH (ref 1.7–7.7)
Neutrophils Relative %: 80 %
Platelets: 365 10*3/uL (ref 150–400)
RBC: 4.22 MIL/uL (ref 4.22–5.81)
RDW: 13.7 % (ref 11.5–15.5)
WBC: 19.8 10*3/uL — ABNORMAL HIGH (ref 4.0–10.5)
nRBC: 0 % (ref 0.0–0.2)

## 2022-09-04 LAB — PROTIME-INR
INR: 1.1 (ref 0.8–1.2)
Prothrombin Time: 14.2 seconds (ref 11.4–15.2)

## 2022-09-04 LAB — COMPREHENSIVE METABOLIC PANEL
ALT: 16 U/L (ref 0–44)
AST: 15 U/L (ref 15–41)
Albumin: 3.2 g/dL — ABNORMAL LOW (ref 3.5–5.0)
Alkaline Phosphatase: 80 U/L (ref 38–126)
Anion gap: 9 (ref 5–15)
BUN: 27 mg/dL — ABNORMAL HIGH (ref 8–23)
CO2: 24 mmol/L (ref 22–32)
Calcium: 9 mg/dL (ref 8.9–10.3)
Chloride: 101 mmol/L (ref 98–111)
Creatinine, Ser: 1.13 mg/dL (ref 0.61–1.24)
GFR, Estimated: >60 mL/min (ref >60)
Glucose, Bld: 248 mg/dL — ABNORMAL HIGH (ref 70–99)
Potassium: 4.3 mmol/L (ref 3.5–5.1)
Sodium: 134 mmol/L — ABNORMAL LOW (ref 135–145)
Total Bilirubin: 0.8 mg/dL (ref 0.3–1.2)
Total Protein: 7.7 g/dL (ref 6.5–8.1)

## 2022-09-04 LAB — LACTIC ACID, PLASMA
Lactic Acid, Venous: 1.3 mmol/L (ref 0.5–1.9)
Lactic Acid, Venous: 1.4 mmol/L (ref 0.5–1.9)

## 2022-09-04 MED ORDER — VANCOMYCIN HCL 2000 MG/400ML IV SOLN
2000.0000 mg | INTRAVENOUS | Status: DC
  Administered 2022-09-04: 2000 mg via INTRAVENOUS
  Filled 2022-09-04: qty 400

## 2022-09-04 MED ORDER — GADOBUTROL 1 MMOL/ML IV SOLN
10.0000 mL | Freq: Once | INTRAVENOUS | Status: AC | PRN
  Administered 2022-09-04: 10 mL via INTRAVENOUS

## 2022-09-04 MED ORDER — MORPHINE SULFATE (PF) 2 MG/ML IV SOLN: 2.0000 mg | INTRAVENOUS | Status: DC | PRN

## 2022-09-04 MED ORDER — INSULIN ASPART 100 UNIT/ML IJ SOLN: 0.0000 [IU] | Freq: Three times a day (TID) | INTRAMUSCULAR | Status: DC

## 2022-09-04 MED ORDER — INSULIN ASPART 100 UNIT/ML IJ SOLN: 0.0000 [IU] | Freq: Every day | INTRAMUSCULAR | Status: DC

## 2022-09-04 NOTE — ED Notes (Addendum)
Pt states "I would just like to go home, the doctor told me I could drive myself to [place] but I really just want to go home and take care of this foot myself. I have already been transported by the ambulance twice and owe like 70,000 dollars so I dont really want to do that again, I just want to go home, I might not even drive myself to [place]"-- MD made aware and MD spoke with pt and informed him of risks of leaving AMA. Pt verbalizes understanding of leaving AMA and signed AMA form.

## 2022-09-04 NOTE — ED Notes (Signed)
Both sets of blood cultures drawn before any antibiotic administration  

## 2022-09-04 NOTE — ED Notes (Signed)
Pt given something to drink.  °

## 2022-09-04 NOTE — Assessment & Plan Note (Signed)
Grant Yang returns to care today for routine follow-up.  His acute concern is increasing left lateral foot pain.  On exam there remains an open wound with foul-smelling discharge that has soaked his through his sock.  Erythema surrounds the wound.  Unable to capture a picture due to technology issues.  Healing has not progressed despite revascularization and taking oral antibiotics as prescribed.  He has not been able to establish care with wound care and was told it would be September at the earliest. -Given failed outpatient treatment, worsening pain, and foul-smelling discharge, I have recommended that Grant Yang present to the emergency department for further evaluation and management.  I am concerned that he has developed osteomyelitis and will require IV antibiotic treatment and potentially surgical intervention.  He is in agreement with this plan.  We will tentatively plan for follow-up in 2 weeks.

## 2022-09-04 NOTE — H&P (Addendum)
History and Physical    ZOHAIR EPP GUY:[id number] DOB: 10-14-1955 DOA: 09/04/2022  PCP: Billie Lade, MD   Patient coming from: Home  I have personally briefly reviewed patient's old medical records in [facility]  Chief Complaint: Left Foot Wound  HPI: Grant Yang is a 67 y.o. male with medical history significant for COPD, left foot ulcer, hypertension, peripheral artery disease, prostate cancer, diabetes mellitus. Patient presented to the ED with complaints of left foot wound, with foul smell and drainage of pus.  He underwent left posterior tibial artery angioplasty 6/13.  Also had left second toe amputation by Dr. Lajoyce Corners April 2024.  Post onset of ulcer 3 weeks after amputation.  Patient was prescribed doxycycline for 14 days, and ciprofloxacin was added 6/24 for 10 days by Dr. Allena Katz.  Reports compliance with his medications but no improvement in his wounds. Patient tells me he feels this prescription for Plavix, but does not know if and when he took his last dose.  ED Course: Temperature 98.4.  Heart rate 70s to 80s.  Respiratory rate 16-22.  Blood pressure systolic 115-160s.  WBC 19.8.  Lactic acid 1.3. MRI of the left foot with and without contrast shows large skin wound about the lateral aspect of the foot extending from the cuboid to the base of the fifth metatarsal, with 1.7 x 1.2 x 5.7 cm collection concerning for abscess.  Also osteomyelitis involving the fifth metatarsal. Hospitalist called for admission.  Review of Systems: As per HPI all other systems reviewed and negative.  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 in 2010 . denies any residual deficits     Past Surgical History:  Procedure Laterality Date   ABDOMINAL AORTOGRAM W/LOWER EXTREMITY N/A 07/31/2022   Procedure: ABDOMINAL AORTOGRAM W/LOWER EXTREMITY;  Surgeon: Leonie Douglas, MD;  [facility] INV[facility] CV LAB;  Service: Cardiovascular;  Laterality: N/A;   AMPUTATION Left 05/31/2022   Procedure: LEFT 2ND TOE AMPUTATION;  Surgeon: Nadara Mustard, MD;  Location: [facility] OR;  Service: Orthopedics;  Laterality: Left;   APPENDECTOMY     CHOLECYSTECTOMY  12/30/2010   Procedure: LAPAROSCOPIC CHOLECYSTECTOMY;  Surgeon: Dalia Heading;  Location: [facility] ORS;  Service: General;  Laterality: N/A;   I & D EXTREMITY Left 05/31/2022   Procedure: LEFT FOOT DEBRIDEMENT;  Surgeon: Nadara Mustard, MD;  Location: [facility] OR;  Service: Orthopedics;  Laterality: Left;   INCISION AND DRAINAGE ABSCESS Left 02/01/2017   Procedure: INCISION AND DRAINAGE ABSCESS LEFT HAND;  Surgeon: Vickki Hearing, MD;  Location: [facility] ORS;  Service: Orthopedics;  Laterality: Left;   LOWER EXTREMITY ANGIOGRAPHY N/A 08/03/2022   Procedure: Lower Extremity Angiography;  Surgeon: Cephus Shelling, MD;  Location: [facility] INVASIVE CV LAB;  Service: Cardiovascular;  Laterality: N/A;   LYMPHADENECTOMY Bilateral 08/02/2017   Procedure: LYMPHADENECTOMY, PELVIC;  Surgeon: Heloise Purpura, MD;  Location: [facility] ORS;  Service: Urology;  Laterality: Bilateral;   PERIPHERAL VASCULAR BALLOON ANGIOPLASTY  08/03/2022  Procedure: PERIPHERAL VASCULAR BALLOON ANGIOPLASTY;  Surgeon: Cephus Shelling, MD;  [facility]: [facility] INVAS[facility] CV LAB;  Service: Cardiovascular;;   ROBOT ASSISTED LAPAROSCOPIC RADICAL PROSTATECTOMY N/A 08/02/2017   Procedure: XI ROBOTIC ASSISTED LAPAROSCOPIC RADICAL PROSTATECTOMY LEVEL 2;  Surgeon: Heloise Purpura, MD;  Location: [facility] ORS;  Service: Urology;  Laterality: N/A;     reports that he quit smoking about 14 years ago. His smoking use  included cigarettes. He started smoking about 52 years ago. He has a 76 pack-year smoking history. He has never used smokeless tobacco. He reports current drug use. Drug: Marijuana. He reports that he does not drink alcohol.  Allergies  Allergen Reactions   Penicillins Other (See Comments)    Unknown reaction during childhood    Family History  Problem Relation Age of Onset   Heart failure Mother    Cancer Father     Prior to Admission medications   Medication Sig Start Date End Date Taking? Authorizing Provider  albuterol (VENTOLIN HFA) 108 (90 Base) MCG/ACT inhaler Inhale 2 puffs into the lungs every 6 (six) hours as needed for wheezing or shortness of breath. 05/29/22   Gardenia Phlegm, MD  aspirin EC 81 MG tablet Take 1 tablet (81 mg total) by mouth daily. Swallow whole. 06/29/22   Billie Lade, MD  atorvastatin (LIPITOR) 40 MG tablet Take 1 tablet (40 mg total) by mouth daily. 06/29/22   Billie Lade, MD  blood glucose meter kit and supplies Dispense based on patient and insurance preference. Use up to four times daily as directed. (FOR ICD-10 E10.9, E11.9). 02/03/17   Langston Reusing, MD  clopidogrel (PLAVIX) 75 MG tablet Take 1 tablet (75 mg total) by mouth daily with breakfast. 08/05/22   Burnadette Pop, MD  Continuous Blood Gluc Sensor (DEXCOM G7 SENSOR) MISC 1 Units by Does not apply route See admin instructions. Change sensor after 10 days 06/05/22   Gardenia Phlegm, MD  EPINEPHrine 0.3 mg/0.3 mL IJ SOAJ injection Inject 0.3 mg into the muscle once as needed for up to 1 dose (if patient exhibits significant signs and symptoms of allergic reaction.). 06/02/22   Lanae Boast, MD  gabapentin (NEURONTIN) 300 MG capsule Take 1 capsule (300 mg total) by mouth 3 (three) times daily as needed. Patient taking differently: Take 300 mg by mouth 3 (three) times daily as needed (Neuropathy). 05/29/22   Gardenia Phlegm, MD  ibuprofen (ADVIL) 200 MG tablet Take 400 mg by mouth 2 (two) times daily  as needed for headache or moderate pain.    [provider]  insulin NPH-regular Human (NOVOLIN 70/30) (70-30) 100 UNIT/ML injection Inject 10 Units into the skin 2 (two) times daily with a meal. Patient taking differently: Inject 10 Units into the skin with breakfast, with lunch, and with evening meal. 02/03/17   Langston Reusing, MD  Insulin Syringes, Disposable, U-100 0.3 ML MISC 10 Units by Does not apply route 2 (two) times daily with breakfast and lunch. 02/03/17   Langston Reusing, MD  metFORMIN (GLUCOPHAGE-XR) 500 MG 24 hr tablet Take 500 mg by mouth once daily x1 week, then increase to 500 mg twice daily x1 week, then 1,000 mg in the AM and 500 mg in the PM x1 week, and finally 1,000 mg twice daily. Take with food. Patient taking differently: Take 1,000 mg by mouth 2 (two) times daily. 05/29/22   Gardenia Phlegm, MD  olmesartan (BENICAR) 20 MG tablet Take 1 tablet (20 mg total)  by mouth daily. 06/29/22 09/27/22  Billie Lade, MD  tirzepatide [facility]) 2.5 MG/0.5ML Pen Inject 2.5 mg into the skin once a week. 08/08/22   Billie Lade, MD    Physical Exam: Vitals:   09/04/22 1156 09/04/22 1330 09/04/22 1400 09/04/22 1542  BP:  116/69 118/68   Pulse:  74 73   Resp:  20 19   Temp:    98.4 F (36.9 C)  TempSrc:    Oral  SpO2:  99% 97%   Weight: 88.9 kg     Height: 5\' 11"  (1.803 m)       Constitutional: NAD, calm, comfortable Vitals:   09/04/22 1156 09/04/22 1330 09/04/22 1400 09/04/22 1542  BP:  116/69 118/68   Pulse:  74 73   Resp:  20 19   Temp:    98.4 F (36.9 C)  TempSrc:    Oral  SpO2:  99% 97%   Weight: 88.9 kg     Height: 5\' 11"  (1.803 m)      Eyes: PERRL, lids and conjunctivae normal ENMT: Mucous membranes are moist.   Neck: normal, supple, no masses, no thyromegaly Respiratory: clear to auscultation bilaterally, no wheezing, no crackles. Normal respiratory effort.  Cardiovascular: Regular rate and rhythm, no murmurs / rubs / gallops. No  extremity edema.  Extremities warm. Abdomen: no tenderness, no masses palpated. No hepatosplenomegaly. Bowel sounds positive.  Musculoskeletal: no clubbing / cyanosis. No joint deformity upper and lower extremities.  Skin:  Foul smell in room, ~7 cm ulceration to lateral aspect of left foot, with another ~3cm ulcer.  Unstageable.  Base of ulcer has black and greenish tissue present. Neurologic: No facial asymmetry, moving extremity spontaneously Psychiatric: Normal judgment and insight. Alert and oriented x 3. Normal mood.        Labs on Admission: I have personally reviewed following labs and imaging studies  CBC: Recent Labs  Lab 09/04/22 1231  WBC 19.8*  NEUTROABS 16.1*  HGB 11.3*  HCT 35.5*  MCV 84.1  PLT 365   Basic Metabolic Panel: Recent Labs  Lab 09/04/22 1231  NA 134*  K 4.3  CL 101  CO2 24  GLUCOSE 248*  BUN 27*  CREATININE 1.13  CALCIUM 9.0   GFR: Estimated Creatinine Clearance: 69.4 mL/min (by C-G formula based on SCr of 1.13 mg/dL). Liver Function Tests: Recent Labs  Lab 09/04/22 1231  AST 15  ALT 16  ALKPHOS 80  BILITOT 0.8  PROT 7.7  ALBUMIN 3.2*   Coagulation Profile: Recent Labs  Lab 09/04/22 1231  INR 1.1   Radiological Exams on Admission: MR FOOT LEFT W WO CONTRAST  Result Date: 09/04/2022 CLINICAL DATA:  Open wound left lateral foot. EXAM: MRI OF THE LEFT FOREFOOT WITHOUT AND WITH CONTRAST TECHNIQUE: Multiplanar, multisequence MR imaging of the left forefoot was performed both before and after administration of intravenous contrast. CONTRAST:  10mL GADAVIST GADOBUTROL 1 MMOL/ML IV SOLN COMPARISON:  Radiographs dated September 04, 2022 FINDINGS: Bones/Joint/Cartilage Postsurgical changes with prior amputation through the second metatarsal. Bone marrow edema of the cuboid, fifth metatarsal, base of the proximal phalanx of the fifth digit. There is marrow edema of the base of the 2nd-4th metatarsal without evidence of adjacent deep skin wound.  Ligaments Lisfranc and collateral ligaments are intact. Muscles and Tendons Increased intramuscular signal of the plantar muscles suggesting myopathy/myositis. Soft tissues Large skin wound about the lateral aspect of the foot extending from the cuboid to the base of the fifth metatarsal with  peripherally enhancing fluid collection measuring at least 1.7 x 1.2 x 5.7 cm. IMPRESSION: 1. Large skin wound about the lateral aspect of the foot extending from the cuboid to the base of the fifth metatarsal with peripherally enhancing fluid collection measuring at least 1.7 x 1.2 x 5.7 cm concerning for abscess. 2. Bone marrow edema of the cuboid, base of the fifth metatarsal, base of the proximal phalanx of the fifth digit concerning for osteomyelitis. 3. Mild marrow edema of the base of the 2nd-4th metatarsals without evidence of adjacent deep skin wound which may represent reactive edema secondary to altered mechanics. 4. Increased intramuscular signal of the plantar muscles suggesting myopathy/myositis. Electronically Signed   By: Larose Hires D.O.   On: 09/04/2022 15:49   DG Foot Complete Left  Result Date: 09/04/2022 CLINICAL DATA:  Left foot wound. EXAM: LEFT FOOT - COMPLETE 3+ VIEW COMPARISON:  July 30, 2022. FINDINGS: Status post surgical amputation of distal portion of second metatarsal as well as second toe phalanges. Vascular calcifications are noted. Soft tissue wound is seen lateral to distal portion of fifth metatarsal. There is underlying lytic destruction of the distal head of fifth metatarsal suggesting osteomyelitis. Larger wound is seen more posteriorly along the lateral portion of the foot with underlying lytic destruction of proximal base of fifth metatarsal. IMPRESSION: Two soft tissue wounds or ulcerations are noted along the lateral aspect of the left foot, with underlying lytic destruction of the proximal and distal portions of the fifth metatarsal consistent with osteomyelitis. Electronically  Signed   By: Lupita Raider M.D.   On: 09/04/2022 12:42    EKG: None  Assessment/Plan Principal Problem:   Osteomyelitis of foot, left, acute (HCC) Active Problems:   Uncontrolled type 2 diabetes mellitus with hyperglycemia (HCC)   Assessment and Plan: * Osteomyelitis of foot, left, acute (HCC) Leukocytosis of 19.4.  Rules out for sepsis.  Lactic acid 1.3.  MRI of the left foot with and without contrast-large skin wound about the lateral aspect of the foot, with warmth 0.7 x 1.2 x 5.7 cm enhancing fluid collection concerning for abscess.  Also with osteomyelitis of the fifth metatarsal.  Persistent and worsening ulcer despite outpatient treatment with doxycycline and ciprofloxacin. -IV vancomycin started, add ceftriazone, metronidazole -Will need ESR, CRP -N.p.o. midnight -IV morphine 2 mg every 4 hours as needed - He tells me he is supposed to be on Plavix as he filled the prescription, but does not know if and when he took his last dose. - During my evaluation, tells me he does not want to see Dr. Lajoyce Corners. He wants to stay at [facility] for treatment. I explained that he had his surgery at [facility], the providers that take care of this are over at [facility]. So he needs transfer.  I explained to him that his outpatient treatment has obviously failed, and he  obviously needs more aggressive care, extent of which will be determined by the surgeons over at [facility].  I have explained that most likely the infection will spread, and he may need more of his leg taken off/amputated because of failure to control infection in the first place. -Patient verbalized understanding.  Reported he might consider just driving himself over to [facility] as he is tired of staying here, the bed is not comfortable.   - Later patient signed AMA form and left, with plan to go home  PAD (peripheral artery disease) (HCC) Underwent angiogram, and left posterior tibial artery angioplasty on  6/13.  -Supposed to be on  Plavix, he does not know if and when he took his last dose.  Uncontrolled type 2 diabetes mellitus with hyperglycemia (HCC) Uncontrolled diabetes.  A1c 14.3. -Patient tells me he took himself off his metformin because he believes it causes infection, but he continued his 54.  Hypertension Stable.  Continue home meds.   DVT prophylaxis: SCDS Code Status: FULL code Family Communication: None at bedside Disposition Plan: > 2 days Consults called: None-initial plan to consult Dr. Audrie Lia team on arrival to [facility].  Patient left AMA Admission status: Inpt Tele I certify that at the point of admission it is my clinical judgment that the patient will require inpatient hospital care spanning beyond 2 midnights from the point of admission due to high intensity of service, high risk for further deterioration and high frequency of surveillance required.    Author: Onnie Boer, MD 09/04/2022 9:07 PM  For on call review [email or web address].

## 2022-09-04 NOTE — Progress Notes (Signed)
Established Patient Office Visit  Subjective   Patient ID: Grant Yang, male    DOB: 1955/06/18  Age: 67 y.o. MRN: [id number]  Chief Complaint  Patient presents with   Hypertension    Follow up   Hyperlipidemia    Follow up   Diabetes    Follow up   Grant Yang returns to care today for routine follow-up.  He was last evaluated at [facility] on 6/26 by Dr. Allena Katz for hospital follow-up in the setting of recent admission 6/9 - 6/14 for diabetic ulcer on his left foot.  Ciprofloxacin was added to his oral antibiotic regimen for pseudomonal coverage as foul-smelling discharge from the wound was appreciated.  He was also referred to wound care.  There have been no acute interval events.  Today Mr. Lesch endorses worsening left lateral foot pain.  He has been taking his antibiotics as prescribed.  He is concerned that the infection has worsened.  He continues to report foul-smelling discharge, noting that he changed his sock just before today's appointment but it is already soaked with discharge.  He denies systemic symptoms of fever/chills and fatigue.  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 in 2010 . denies any residual deficits    Past Surgical History:  Procedure Laterality Date   ABDOMINAL AORTOGRAM W/LOWER EXTREMITY N/A 07/31/2022   Procedure: ABDOMINAL AORTOGRAM W/LOWER EXTREMITY;  Surgeon: Leonie Douglas, MD;  [facility] INVASIVE CV LAB;  Service: Cardiovascular;  Laterality: N/A;   AMPUTATION Left 05/31/2022   Procedure: LEFT 2ND TOE AMPUTATION;  Surgeon: Nadara Mustard, MD;   Location: [facility] OR;  Service: Orthopedics;  Laterality: Left;   APPENDECTOMY     CHOLECYSTECTOMY  12/30/2010   Procedure: LAPAROSCOPIC CHOLECYSTECTOMY;  Surgeon: Dalia Heading;  Location: [facility] ORS;  Service: General;  Laterality: N/A;   I & D EXTREMITY Left 05/31/2022   Procedure: LEFT FOOT DEBRIDEMENT;  Surgeon: Nadara Mustard, MD;  Location: [facility] OR;  Service: Orthopedics;  Laterality: Left;   INCISION AND DRAINAGE ABSCESS Left 02/01/2017   Procedure: INCISION AND DRAINAGE ABSCESS LEFT HAND;  Surgeon: Vickki Hearing, MD;  Location: [facility] ORS;  Service: Orthopedics;  Laterality: Left;   LOWER EXTREMITY ANGIOGRAPHY N/A 08/03/2022   Procedure: Lower Extremity Angiography;  Surgeon: Cephus Shelling, MD;  Location: [facility] INVASIVE CV LAB;  Service: Cardiovascular;  Laterality: N/A;   LYMPHADENECTOMY Bilateral 08/02/2017   Procedure: LYMPHADENECTOMY, PELVIC;  Surgeon: Heloise Purpura, MD;  Location: [facility] ORS;  Service: Urology;  Laterality: Bilateral;   PERIPHERAL VASCULAR BALLOON ANGIOPLASTY  08/03/2022   Procedure: PERIPHERAL VASCULAR BALLOON ANGIOPLASTY;  Surgeon: Cephus Shelling, MD;  [facility] INVASIVE CV LAB;  Service: Cardiovascular;;   ROBOT ASSISTED LAPAROSCOPIC RADICAL PROSTATECTOMY N/A 08/02/2017   Procedure: XI ROBOTIC ASSISTED LAPAROSCOPIC RADICAL PROSTATECTOMY LEVEL 2;  Surgeon: Heloise Purpura, MD;  Location: [facility] ORS;  Service: Urology;  Laterality: N/A;   Social History   Tobacco Use   Smoking status: Former  Current packs/day: 0.00    Average packs/day: 2.0 packs/day for 38.0 years (76.0 ttl pk-yrs)    Types: Cigarettes    Start date: 47    Quit date: 2010    Years since quitting: 14.5   Smokeless tobacco: Never  Substance Use Topics   Alcohol use: No   Drug use: Yes    Types: Marijuana    Comment: occ   Family History  Problem Relation Age of Onset   Heart failure Mother    Cancer Father    Allergies  Allergen Reactions   Penicillins Other (See Comments)     Unknown reaction during childhood   Review of Systems  Musculoskeletal:        Worsening left lateral foot pain     Objective:     BP (!) 93/57   Pulse 89   Ht 5\' 11"  (1.803 m)   Wt 196 lb (88.9 kg)   SpO2 96%   BMI 27.34 kg/m  BP Readings from Last 3 Encounters:  09/04/22 (!) 154/81  09/04/22 (!) 93/57  08/16/22 110/64   Physical Exam Vitals reviewed.  Constitutional:      General: He is not in acute distress.    Appearance: He is not diaphoretic.  HENT:     Head: Normocephalic and atraumatic.     Mouth/Throat:     Mouth: Mucous membranes are moist.  Eyes:     General: No scleral icterus.    Extraocular Movements: Extraocular movements intact.  Cardiovascular:     Rate and Rhythm: Normal rate and regular rhythm.     Heart sounds: Normal heart sounds. No murmur heard. Pulmonary:     Breath sounds: Normal breath sounds. No wheezing or rales.  Musculoskeletal:     Cervical back: Neck supple. No tenderness.     Left lower leg: Edema present.  Skin:    General: Skin is warm.     Findings: Lesion present.     Comments: There is a large open wound on the lateral aspect of the left foot with foul-smelling discharge and surrounding erythema.  Granular tissue is intact.   Neurological:     General: No focal deficit present.     Mental Status: He is alert and oriented to person, place, and time.     Sensory: No sensory deficit.     Motor: No weakness.  Psychiatric:        Mood and Affect: Mood normal.        Behavior: Behavior normal.   Last CBC Lab Results  Component Value Date   WBC 19.8 (H) 09/04/2022   HGB 11.3 (L) 09/04/2022   HCT 35.5 (L) 09/04/2022   MCV 84.1 09/04/2022   MCH 26.8 09/04/2022   RDW 13.7 09/04/2022   PLT 365 09/04/2022   Last metabolic panel Lab Results  Component Value Date   GLUCOSE 248 (H) 09/04/2022   NA 134 (L) 09/04/2022   K 4.3 09/04/2022   CL 101 09/04/2022   CO2 24 09/04/2022   BUN 27 (H) 09/04/2022   CREATININE 1.13  09/04/2022   EGFR 95 08/16/2022   CALCIUM 9.0 09/04/2022   PHOS 3.3 12/29/2010   PROT 7.7 09/04/2022   ALBUMIN 3.2 (L) 09/04/2022   LABGLOB 3.2 08/16/2022   BILITOT 0.8 09/04/2022   ALKPHOS 80 09/04/2022   AST 15 09/04/2022   ALT 16 09/04/2022   ANIONGAP 9 09/04/2022   Last hemoglobin A1c Lab Results  Component Value Date   HGBA1C 14.3 (H)  05/29/2022     Assessment & Plan:   Problem List Items Addressed This Visit       Foot ulcer, left (HCC) - Primary    Grant Yang returns to care today for routine follow-up.  His acute concern is increasing left lateral foot pain.  On exam there remains an open wound with foul-smelling discharge that has soaked his through his sock.  Erythema surrounds the wound.  Unable to capture a picture due to technology issues.  Healing has not progressed despite revascularization and taking oral antibiotics as prescribed.  He has not been able to establish care with wound care and was told it would be September at the earliest. -Given failed outpatient treatment, worsening pain, and foul-smelling discharge, I have recommended that Mr. Kerkhoff present to the emergency department for further evaluation and management.  I am concerned that he has developed osteomyelitis and will require IV antibiotic treatment and potentially surgical intervention.  He is in agreement with this plan.  We will tentatively plan for follow-up in 2 weeks.      Return in about 2 weeks (around 09/18/2022).   Billie Lade, MD

## 2022-09-04 NOTE — ED Notes (Signed)
Patient transported to MRI 

## 2022-09-04 NOTE — ED Notes (Signed)
Skin on L foot alongside the side of the foot appears to have slough, the corners of wound appear to be slightly oozing puss, foul-smelling in nature

## 2022-09-04 NOTE — Patient Instructions (Signed)
It was a pleasure to see you today.  Thank you for giving [place] the opportunity to be involved in your care.  Below is a brief recap of your visit and next steps.  We will plan to see you again in 2 weeks.  Summary I recommend presenting to the emergency department to have your foot evaluated.  Follow up in 2-4 weeks

## 2022-09-04 NOTE — Assessment & Plan Note (Signed)
Underwent angiogram, and left posterior tibial artery angioplasty on 6/13.  -Supposed to be on Plavix, he does not know if and when he took his last dose.

## 2022-09-04 NOTE — ED Triage Notes (Signed)
Pt via POV sent by PCP for eval of left foot wound. Pt states he had a toe amputated in April and since then the wound has not healed and has continued to worsen. Today he has pain rated 8/10 with foul-smelling exudate.

## 2022-09-04 NOTE — ED Notes (Signed)
Pt given a urinal.

## 2022-09-04 NOTE — Assessment & Plan Note (Signed)
Uncontrolled diabetes.  A1c 14.3. -Patient tells me he took himself off his metformin because he believes it causes infection, but he continued his 40.

## 2022-09-04 NOTE — ED Provider Notes (Addendum)
[place] EMERGENCY DEPARTMENT AT [facility] Provider Note   CSN: [id number] Arrival date & time: 09/04/22  1147     History  Chief Complaint  Patient presents with   Wound Infection    KYREL LEIGHTON is a 67 y.o. male.  Patient is a 67 year old male with history of osteomyelitis of the left lower extremity, diabetes type 2, COPD, hypertension, CVA, presenting to emergency department for evaluation left lower extremity wound. Patient had left toe amputation by Dr. Lajoyce Corners in April 2024. Patient was then noncompliant with diabetic medications and developed chronic nonhealing left lower extremity ulcer. Received IV vancomycin and cefepime in the hospital. He then underwent left posterior tibial artery angioplasty on 6/13. On discharge received prescription for doxycycline 100mg  bid x 14 days. Then was prescribed ciprofloxacin 500 mg bid for ten days to cover for pseudomonas by Dr. Allena Katz. Now has increased pain, warmth, swelling, discharge, and foul odor.   The history is provided by the patient. No language interpreter was used.       Home Medications Prior to Admission medications   Medication Sig Start Date End Date Taking? Authorizing Provider  albuterol (VENTOLIN HFA) 108 (90 Base) MCG/ACT inhaler Inhale 2 puffs into the lungs every 6 (six) hours as needed for wheezing or shortness of breath. 05/29/22   Gardenia Phlegm, MD  aspirin EC 81 MG tablet Take 1 tablet (81 mg total) by mouth daily. Swallow whole. 06/29/22   Billie Lade, MD  atorvastatin (LIPITOR) 40 MG tablet Take 1 tablet (40 mg total) by mouth daily. 06/29/22   Billie Lade, MD  blood glucose meter kit and supplies Dispense based on patient and insurance preference. Use up to four times daily as directed. (FOR ICD-10 E10.9, E11.9). 02/03/17   Langston Reusing, MD  clopidogrel (PLAVIX) 75 MG tablet Take 1 tablet (75 mg total) by mouth daily with breakfast. 08/05/22   Burnadette Pop, MD  Continuous Blood  Gluc Sensor (DEXCOM G7 SENSOR) MISC 1 Units by Does not apply route See admin instructions. Change sensor after 10 days 06/05/22   Gardenia Phlegm, MD  EPINEPHrine 0.3 mg/0.3 mL IJ SOAJ injection Inject 0.3 mg into the muscle once as needed for up to 1 dose (if patient exhibits significant signs and symptoms of allergic reaction.). 06/02/22   Lanae Boast, MD  gabapentin (NEURONTIN) 300 MG capsule Take 1 capsule (300 mg total) by mouth 3 (three) times daily as needed. Patient taking differently: Take 300 mg by mouth 3 (three) times daily as needed (Neuropathy). 05/29/22   Gardenia Phlegm, MD  ibuprofen (ADVIL) 200 MG tablet Take 400 mg by mouth 2 (two) times daily as needed for headache or moderate pain.    [provider]  insulin NPH-regular Human (NOVOLIN 70/30) (70-30) 100 UNIT/ML injection Inject 10 Units into the skin 2 (two) times daily with a meal. Patient taking differently: Inject 10 Units into the skin with breakfast, with lunch, and with evening meal. 02/03/17   Langston Reusing, MD  Insulin Syringes, Disposable, U-100 0.3 ML MISC 10 Units by Does not apply route 2 (two) times daily with breakfast and lunch. 02/03/17   Langston Reusing, MD  metFORMIN (GLUCOPHAGE-XR) 500 MG 24 hr tablet Take 500 mg by mouth once daily x1 week, then increase to 500 mg twice daily x1 week, then 1,000 mg in the AM and 500 mg in the PM x1 week, and finally 1,000 mg twice daily. Take with food.  Patient taking differently: Take 1,000 mg by mouth 2 (two) times daily. 05/29/22   Gardenia Phlegm, MD  olmesartan (BENICAR) 20 MG tablet Take 1 tablet (20 mg total) by mouth daily. 06/29/22 09/27/22  Billie Lade, MD  tirzepatide [facility]) 2.5 MG/0.5ML Pen Inject 2.5 mg into the skin once a week. 08/08/22   Billie Lade, MD      Allergies    Penicillins    Review of Systems   Review of Systems  Constitutional:  Negative for chills and fever.  HENT:  Negative for ear pain and sore throat.   Eyes:  Negative  for pain and visual disturbance.  Respiratory:  Negative for cough and shortness of breath.   Cardiovascular:  Negative for chest pain and palpitations.  Gastrointestinal:  Negative for abdominal pain and vomiting.  Genitourinary:  Negative for dysuria and hematuria.  Musculoskeletal:  Negative for arthralgias and back pain.  Skin:  Negative for color change and rash.  Neurological:  Negative for seizures and syncope.  All other systems reviewed and are negative.   Physical Exam Updated Vital Signs BP 118/68   Pulse 73   Temp 98.4 F (36.9 C) (Oral)   Resp 19   Ht 5\' 11"  (1.803 m)   Wt 88.9 kg   SpO2 97%   BMI 27.34 kg/m  Physical Exam Vitals and nursing note reviewed.  Constitutional:      General: He is not in acute distress.    Appearance: He is well-developed.  HENT:     Head: Normocephalic and atraumatic.  Eyes:     Conjunctiva/sclera: Conjunctivae normal.  Cardiovascular:     Rate and Rhythm: Normal rate and regular rhythm.     Heart sounds: No murmur heard. Pulmonary:     Effort: Pulmonary effort is normal. No respiratory distress.     Breath sounds: Normal breath sounds.  Abdominal:     Palpations: Abdomen is soft.     Tenderness: There is no abdominal tenderness.  Musculoskeletal:        General: No swelling.     Cervical back: Neck supple.  Skin:    General: Skin is warm and dry.     Capillary Refill: Capillary refill takes less than 2 seconds.       Neurological:     Mental Status: He is alert.  Psychiatric:        Mood and Affect: Mood normal.        ED Results / Procedures / Treatments   Labs (all labs ordered are listed, but only abnormal results are displayed) Labs Reviewed  COMPREHENSIVE METABOLIC PANEL - Abnormal; Notable for the following components:      Result Value   Sodium 134 (*)    Glucose, Bld 248 (*)    BUN 27 (*)    Albumin 3.2 (*)    All other components within normal limits  CBC WITH DIFFERENTIAL/PLATELET - Abnormal;  Notable for the following components:   WBC 19.8 (*)    Hemoglobin 11.3 (*)    HCT 35.5 (*)    Neutro Abs 16.1 (*)    Monocytes Absolute 1.8 (*)    Abs Immature Granulocytes 0.11 (*)    All other components within normal limits  CULTURE, BLOOD (ROUTINE X 2)  CULTURE, BLOOD (ROUTINE X 2)  AEROBIC/ANAEROBIC CULTURE W GRAM STAIN (SURGICAL/DEEP WOUND)  LACTIC ACID, PLASMA  LACTIC ACID, PLASMA  PROTIME-INR  URINALYSIS, W/ REFLEX TO CULTURE (INFECTION SUSPECTED)    EKG None  Radiology MR FOOT LEFT W WO CONTRAST  Result Date: 09/04/2022 CLINICAL DATA:  Open wound left lateral foot. EXAM: MRI OF THE LEFT FOREFOOT WITHOUT AND WITH CONTRAST TECHNIQUE: Multiplanar, multisequence MR imaging of the left forefoot was performed both before and after administration of intravenous contrast. CONTRAST:  10mL GADAVIST GADOBUTROL 1 MMOL/ML IV SOLN COMPARISON:  Radiographs dated September 04, 2022 FINDINGS: Bones/Joint/Cartilage Postsurgical changes with prior amputation through the second metatarsal. Bone marrow edema of the cuboid, fifth metatarsal, base of the proximal phalanx of the fifth digit. There is marrow edema of the base of the 2nd-4th metatarsal without evidence of adjacent deep skin wound. Ligaments Lisfranc and collateral ligaments are intact. Muscles and Tendons Increased intramuscular signal of the plantar muscles suggesting myopathy/myositis. Soft tissues Large skin wound about the lateral aspect of the foot extending from the cuboid to the base of the fifth metatarsal with peripherally enhancing fluid collection measuring at least 1.7 x 1.2 x 5.7 cm. IMPRESSION: 1. Large skin wound about the lateral aspect of the foot extending from the cuboid to the base of the fifth metatarsal with peripherally enhancing fluid collection measuring at least 1.7 x 1.2 x 5.7 cm concerning for abscess. 2. Bone marrow edema of the cuboid, base of the fifth metatarsal, base of the proximal phalanx of the fifth digit  concerning for osteomyelitis. 3. Mild marrow edema of the base of the 2nd-4th metatarsals without evidence of adjacent deep skin wound which may represent reactive edema secondary to altered mechanics. 4. Increased intramuscular signal of the plantar muscles suggesting myopathy/myositis. Electronically Signed   By: Larose Hires D.O.   On: 09/04/2022 15:49   DG Foot Complete Left  Result Date: 09/04/2022 CLINICAL DATA:  Left foot wound. EXAM: LEFT FOOT - COMPLETE 3+ VIEW COMPARISON:  July 30, 2022. FINDINGS: Status post surgical amputation of distal portion of second metatarsal as well as second toe phalanges. Vascular calcifications are noted. Soft tissue wound is seen lateral to distal portion of fifth metatarsal. There is underlying lytic destruction of the distal head of fifth metatarsal suggesting osteomyelitis. Larger wound is seen more posteriorly along the lateral portion of the foot with underlying lytic destruction of proximal base of fifth metatarsal. IMPRESSION: Two soft tissue wounds or ulcerations are noted along the lateral aspect of the left foot, with underlying lytic destruction of the proximal and distal portions of the fifth metatarsal consistent with osteomyelitis. Electronically Signed   By: Lupita Raider M.D.   On: 09/04/2022 12:42    Procedures Procedures    Medications Ordered in ED Medications  vancomycin (VANCOREADY) IVPB 2000 mg/400 mL (2,000 mg Intravenous New Bag/Given 09/04/22 1547)  gadobutrol (GADAVIST) 1 MMOL/ML injection 10 mL (10 mLs Intravenous Contrast Given 09/04/22 1444)    ED Course/ Medical Decision Making/ A&P                             Medical Decision Making Amount and/or Complexity of Data Reviewed Labs: ordered. Radiology: ordered.  Risk Prescription drug management. Decision regarding hospitalization.   67 year old male with extensive left lower extremity wound care presenting for worsening left lower extremity ulcer with new warmth and  odor.  Please see HPI for antibiotic regimen.  On exam patient is alert and oriented x 3, no acute distress, afebrile, stable vital signs.  Physical exam demonstrates 2nd toe amputation. 5 cm wound to left lateral foot. Foul odor. Surrounding erythema. Discharge. Please see attached photos.  Laboratory studies concerning for leukocytosis of 19.8.  Aerobic wound culture sent.  MRI results pending.  Vancomycin started.  Patient recommended for admission for lower extremity wound with failed outpatient therapy of antibiotics and uptrending white count.Patient accepted by admitting team.    MRI demonstrates:  1. Large skin wound about the lateral aspect of the foot extending from the cuboid to the base of the fifth metatarsal with peripherally enhancing fluid collection measuring at least 1.7 x 1.2 x 5.7 cm concerning for abscess. 2. Bone marrow edema of the cuboid, base of the fifth metatarsal, base of the proximal phalanx of the fifth digit concerning for osteomyelitis. 3. Mild marrow edema of the base of the 2nd-4th metatarsals without evidence of adjacent deep skin wound which may represent reactive edema secondary to altered mechanics. 4. Increased intramuscular signal of the plantar muscles suggesting myopathy/myositis.    Final Clinical Impression(s) / ED Diagnoses Final diagnoses:  Wound infection  Cellulitis of left foot  Osteomyelitis of left foot, unspecified type [facility])    Rx / DC Orders ED Discharge Orders     None         Franne Forts, DO 09/04/22 1555    Franne Forts, DO 09/04/22 1623

## 2022-09-04 NOTE — Assessment & Plan Note (Signed)
 Stable.  Continue home meds. ?

## 2022-09-04 NOTE — Assessment & Plan Note (Addendum)
Leukocytosis of 19.4.  Rules out for sepsis.  Lactic acid 1.3.  MRI of the left foot with and without contrast-large skin wound about the lateral aspect of the foot, with warmth 0.7 x 1.2 x 5.7 cm enhancing fluid collection concerning for abscess.  Also with osteomyelitis of the fifth metatarsal.  Persistent and worsening ulcer despite outpatient treatment with doxycycline and ciprofloxacin. -IV vancomycin started, add ceftriazone, metronidazole -Will need ESR, CRP -N.p.o. midnight -IV morphine 2 mg every 4 hours as needed - He tells me he is supposed to be on Plavix as he filled the prescription, but does not know if and when he took his last dose. - During my evaluation, tells me he does not want to see Dr. Lajoyce Corners. He wants to stay at [facility] for treatment. I explained that he had his surgery at [facility], the providers that take care of this are over at [facility]. So he needs transfer.  I explained to him that his outpatient treatment has obviously failed, and he  obviously needs more aggressive care, extent of which will be determined by the surgeons over at [facility].  I have explained that most likely the infection will spread, and he may need more of his leg taken off/amputated because of failure to control infection in the first place. -Patient verbalized understanding.  Reported he might consider just driving himself over to [facility] as he is tired of staying here, the bed is not comfortable.   - Later patient signed AMA form and left, with plan to go home

## 2022-09-04 NOTE — Consult Note (Addendum)
Pharmacy Antibiotic Note  ASSESSMENT: 67 y.o. male with PMH h/o cellulitis, COPD, OM s/p toe amputation, PAD is presenting with  left foot wound . Wound is very painful with foul-smelling exudate and pt has worsening leukocytosis. Pharmacy has been consulted to manage vancomycin dosing.   Patient measurements: Height: 5\' 11"  (180.3 cm) Weight: 88.9 kg (196 lb) IBW/kg (Calculated) : 75.3  Vital signs: Temp: 98.4 F (36.9 C) (07/15 1155) Temp Source: Oral (07/15 1155) BP: 118/68 (07/15 1400) Pulse Rate: 73 (07/15 1400) Recent Labs  Lab 09/04/22 1231  WBC 19.8*  CREATININE 1.13   Estimated Creatinine Clearance: 69.4 mL/min (by C-G formula based on SCr of 1.13 mg/dL).  Allergies: Allergies  Allergen Reactions   Penicillins Other (See Comments)    Unknown reaction during childhood    Antimicrobials this admission: Vancomycin 7/15 >>   Dose adjustments this admission: N/A  Microbiology results: 7/15 BCx: sent 7/15 Wound cx: sent   Vancomycin 2000 mg IV q24H Goal AUC 400-550  Est AUC: 504; Cmax: 40; Cmin: 10 SCr 1.13; IBW/TBW; Vd 0.72  PLAN: Initiate vancomycin 2000 mg IV q24H Follow up culture results to assess for antibiotic optimization. Monitor renal function to assess for any necessary antibiotic dosing changes.   Thank you for allowing pharmacy to be a part of this patient's care.  Will M. Dareen Piano, PharmD PGY-1 Pharmacy Resident 09/04/2022 3:29 PM

## 2022-09-08 ENCOUNTER — Ambulatory Visit (HOSPITAL_COMMUNITY): Payer: Medicare Other

## 2022-09-08 ENCOUNTER — Encounter: Payer: 59 | Admitting: Vascular Surgery

## 2022-09-08 LAB — AEROBIC/ANAEROBIC CULTURE W GRAM STAIN (SURGICAL/DEEP WOUND)

## 2022-09-08 LAB — CULTURE, BLOOD (ROUTINE X 2)
Culture: NO GROWTH
Culture: NO GROWTH

## 2022-09-09 LAB — CULTURE, BLOOD (ROUTINE X 2): Special Requests: ADEQUATE

## 2022-09-13 ENCOUNTER — Encounter (HOSPITAL_COMMUNITY): Payer: Self-pay | Admitting: Emergency Medicine

## 2022-09-13 ENCOUNTER — Inpatient Hospital Stay (HOSPITAL_COMMUNITY)
Admission: EM | Admit: 2022-09-13 | Discharge: 2022-09-25 | DRG: 853 | Disposition: A | Discharge status: Home Health | Payer: Medicare Other | Attending: Family Medicine | Admitting: Family Medicine

## 2022-09-13 ENCOUNTER — Emergency Department (HOSPITAL_COMMUNITY): Payer: Medicare Other

## 2022-09-13 ENCOUNTER — Other Ambulatory Visit: Payer: Self-pay

## 2022-09-13 ENCOUNTER — Ambulatory Visit: Payer: Medicare Other | Admitting: Podiatry

## 2022-09-13 ENCOUNTER — Encounter: Payer: Self-pay | Admitting: Podiatry

## 2022-09-13 DIAGNOSIS — E1169 Type 2 diabetes mellitus with other specified complication: Secondary | ICD-10-CM | POA: Diagnosis present

## 2022-09-13 DIAGNOSIS — Z7982 Long term (current) use of aspirin: Secondary | ICD-10-CM

## 2022-09-13 DIAGNOSIS — L03818 Cellulitis of other sites: Principal | ICD-10-CM

## 2022-09-13 DIAGNOSIS — Z87891 Personal history of nicotine dependence: Secondary | ICD-10-CM

## 2022-09-13 DIAGNOSIS — A4159 Other Gram-negative sepsis: Principal | ICD-10-CM | POA: Diagnosis present

## 2022-09-13 DIAGNOSIS — L97529 Non-pressure chronic ulcer of other part of left foot with unspecified severity: Secondary | ICD-10-CM | POA: Diagnosis not present

## 2022-09-13 DIAGNOSIS — I10 Essential (primary) hypertension: Secondary | ICD-10-CM | POA: Diagnosis not present

## 2022-09-13 DIAGNOSIS — E1142 Type 2 diabetes mellitus with diabetic polyneuropathy: Secondary | ICD-10-CM | POA: Diagnosis present

## 2022-09-13 DIAGNOSIS — E1151 Type 2 diabetes mellitus with diabetic peripheral angiopathy without gangrene: Secondary | ICD-10-CM | POA: Diagnosis not present

## 2022-09-13 DIAGNOSIS — Z87442 Personal history of urinary calculi: Secondary | ICD-10-CM

## 2022-09-13 DIAGNOSIS — N19 Unspecified kidney failure: Secondary | ICD-10-CM | POA: Diagnosis present

## 2022-09-13 DIAGNOSIS — B9689 Other specified bacterial agents as the cause of diseases classified elsewhere: Secondary | ICD-10-CM | POA: Diagnosis not present

## 2022-09-13 DIAGNOSIS — Z8546 Personal history of malignant neoplasm of prostate: Secondary | ICD-10-CM

## 2022-09-13 DIAGNOSIS — E8809 Other disorders of plasma-protein metabolism, not elsewhere classified: Secondary | ICD-10-CM | POA: Diagnosis present

## 2022-09-13 DIAGNOSIS — E872 Acidosis, unspecified: Secondary | ICD-10-CM | POA: Diagnosis present

## 2022-09-13 DIAGNOSIS — I739 Peripheral vascular disease, unspecified: Secondary | ICD-10-CM | POA: Diagnosis not present

## 2022-09-13 DIAGNOSIS — B964 Proteus (mirabilis) (morganii) as the cause of diseases classified elsewhere: Secondary | ICD-10-CM | POA: Diagnosis not present

## 2022-09-13 DIAGNOSIS — L02619 Cutaneous abscess of unspecified foot: Secondary | ICD-10-CM

## 2022-09-13 DIAGNOSIS — Z6828 Body mass index (BMI) 28.0-28.9, adult: Secondary | ICD-10-CM

## 2022-09-13 DIAGNOSIS — M87875 Other osteonecrosis, left foot: Secondary | ICD-10-CM | POA: Diagnosis not present

## 2022-09-13 DIAGNOSIS — R197 Diarrhea, unspecified: Secondary | ICD-10-CM | POA: Diagnosis present

## 2022-09-13 DIAGNOSIS — E1165 Type 2 diabetes mellitus with hyperglycemia: Secondary | ICD-10-CM

## 2022-09-13 DIAGNOSIS — Z79899 Other long term (current) drug therapy: Secondary | ICD-10-CM

## 2022-09-13 DIAGNOSIS — M869 Osteomyelitis, unspecified: Secondary | ICD-10-CM | POA: Diagnosis not present

## 2022-09-13 DIAGNOSIS — Z89612 Acquired absence of left leg above knee: Secondary | ICD-10-CM | POA: Diagnosis not present

## 2022-09-13 DIAGNOSIS — D649 Anemia, unspecified: Secondary | ICD-10-CM | POA: Diagnosis present

## 2022-09-13 DIAGNOSIS — A419 Sepsis, unspecified organism: Secondary | ICD-10-CM | POA: Diagnosis not present

## 2022-09-13 DIAGNOSIS — Z7409 Other reduced mobility: Secondary | ICD-10-CM | POA: Diagnosis present

## 2022-09-13 DIAGNOSIS — Z89432 Acquired absence of left foot: Secondary | ICD-10-CM | POA: Diagnosis not present

## 2022-09-13 DIAGNOSIS — S91332A Puncture wound without foreign body, left foot, initial encounter: Secondary | ICD-10-CM | POA: Diagnosis not present

## 2022-09-13 DIAGNOSIS — R1110 Vomiting, unspecified: Secondary | ICD-10-CM | POA: Diagnosis not present

## 2022-09-13 DIAGNOSIS — E871 Hypo-osmolality and hyponatremia: Secondary | ICD-10-CM | POA: Diagnosis present

## 2022-09-13 DIAGNOSIS — R7881 Bacteremia: Secondary | ICD-10-CM | POA: Diagnosis not present

## 2022-09-13 DIAGNOSIS — Z89512 Acquired absence of left leg below knee: Secondary | ICD-10-CM | POA: Diagnosis not present

## 2022-09-13 DIAGNOSIS — R739 Hyperglycemia, unspecified: Secondary | ICD-10-CM | POA: Diagnosis not present

## 2022-09-13 DIAGNOSIS — M86172 Other acute osteomyelitis, left ankle and foot: Secondary | ICD-10-CM | POA: Diagnosis present

## 2022-09-13 DIAGNOSIS — E860 Dehydration: Secondary | ICD-10-CM | POA: Diagnosis present

## 2022-09-13 DIAGNOSIS — N179 Acute kidney failure, unspecified: Secondary | ICD-10-CM | POA: Diagnosis not present

## 2022-09-13 DIAGNOSIS — M79671 Pain in right foot: Secondary | ICD-10-CM | POA: Diagnosis not present

## 2022-09-13 DIAGNOSIS — E8729 Other acidosis: Secondary | ICD-10-CM | POA: Diagnosis present

## 2022-09-13 DIAGNOSIS — E11621 Type 2 diabetes mellitus with foot ulcer: Secondary | ICD-10-CM | POA: Diagnosis not present

## 2022-09-13 DIAGNOSIS — E861 Hypovolemia: Secondary | ICD-10-CM | POA: Diagnosis present

## 2022-09-13 DIAGNOSIS — Z794 Long term (current) use of insulin: Secondary | ICD-10-CM | POA: Diagnosis not present

## 2022-09-13 DIAGNOSIS — S82112A Displaced fracture of left tibial spine, initial encounter for closed fracture: Secondary | ICD-10-CM | POA: Diagnosis not present

## 2022-09-13 DIAGNOSIS — Z9049 Acquired absence of other specified parts of digestive tract: Secondary | ICD-10-CM

## 2022-09-13 DIAGNOSIS — J449 Chronic obstructive pulmonary disease, unspecified: Secondary | ICD-10-CM | POA: Diagnosis not present

## 2022-09-13 DIAGNOSIS — L03116 Cellulitis of left lower limb: Secondary | ICD-10-CM | POA: Diagnosis not present

## 2022-09-13 DIAGNOSIS — E119 Type 2 diabetes mellitus without complications: Secondary | ICD-10-CM

## 2022-09-13 DIAGNOSIS — R6520 Severe sepsis without septic shock: Secondary | ICD-10-CM | POA: Diagnosis not present

## 2022-09-13 DIAGNOSIS — G8918 Other acute postprocedural pain: Secondary | ICD-10-CM | POA: Diagnosis not present

## 2022-09-13 DIAGNOSIS — Z7902 Long term (current) use of antithrombotics/antiplatelets: Secondary | ICD-10-CM

## 2022-09-13 DIAGNOSIS — L03119 Cellulitis of unspecified part of limb: Secondary | ICD-10-CM

## 2022-09-13 DIAGNOSIS — F32A Depression, unspecified: Secondary | ICD-10-CM | POA: Diagnosis not present

## 2022-09-13 DIAGNOSIS — R9431 Abnormal electrocardiogram [ECG] [EKG]: Secondary | ICD-10-CM | POA: Diagnosis not present

## 2022-09-13 DIAGNOSIS — K219 Gastro-esophageal reflux disease without esophagitis: Secondary | ICD-10-CM | POA: Diagnosis present

## 2022-09-13 DIAGNOSIS — R7989 Other specified abnormal findings of blood chemistry: Secondary | ICD-10-CM | POA: Diagnosis present

## 2022-09-13 DIAGNOSIS — E663 Overweight: Secondary | ICD-10-CM | POA: Diagnosis present

## 2022-09-13 DIAGNOSIS — Z7985 Long-term (current) use of injectable non-insulin antidiabetic drugs: Secondary | ICD-10-CM

## 2022-09-13 DIAGNOSIS — Z9862 Peripheral vascular angioplasty status: Secondary | ICD-10-CM

## 2022-09-13 DIAGNOSIS — Z89422 Acquired absence of other left toe(s): Secondary | ICD-10-CM

## 2022-09-13 DIAGNOSIS — Z8249 Family history of ischemic heart disease and other diseases of the circulatory system: Secondary | ICD-10-CM

## 2022-09-13 DIAGNOSIS — E876 Hypokalemia: Secondary | ICD-10-CM | POA: Diagnosis not present

## 2022-09-13 DIAGNOSIS — M879 Osteonecrosis, unspecified: Secondary | ICD-10-CM | POA: Diagnosis not present

## 2022-09-13 DIAGNOSIS — Z8673 Personal history of transient ischemic attack (TIA), and cerebral infarction without residual deficits: Secondary | ICD-10-CM

## 2022-09-13 DIAGNOSIS — Z880 Allergy status to penicillin: Secondary | ICD-10-CM

## 2022-09-13 DIAGNOSIS — Z9079 Acquired absence of other genital organ(s): Secondary | ICD-10-CM

## 2022-09-13 DIAGNOSIS — M86162 Other acute osteomyelitis, left tibia and fibula: Secondary | ICD-10-CM | POA: Diagnosis not present

## 2022-09-13 DIAGNOSIS — E43 Unspecified severe protein-calorie malnutrition: Secondary | ICD-10-CM | POA: Diagnosis present

## 2022-09-13 DIAGNOSIS — Z0389 Encounter for observation for other suspected diseases and conditions ruled out: Secondary | ICD-10-CM | POA: Diagnosis not present

## 2022-09-13 LAB — COMPREHENSIVE METABOLIC PANEL
ALT: 25 U/L (ref 0–44)
AST: 31 U/L (ref 15–41)
Albumin: 2.2 g/dL — ABNORMAL LOW (ref 3.5–5.0)
Alkaline Phosphatase: 161 U/L — ABNORMAL HIGH (ref 38–126)
Anion gap: 18 — ABNORMAL HIGH (ref 5–15)
BUN: 41 mg/dL — ABNORMAL HIGH (ref 8–23)
CO2: 19 mmol/L — ABNORMAL LOW (ref 22–32)
Calcium: 8.9 mg/dL (ref 8.9–10.3)
Chloride: 83 mmol/L — ABNORMAL LOW (ref 98–111)
Creatinine, Ser: 1.54 mg/dL — ABNORMAL HIGH (ref 0.61–1.24)
GFR, Estimated: 50 mL/min — ABNORMAL LOW (ref >60)
Glucose, Bld: 350 mg/dL — ABNORMAL HIGH (ref 70–99)
Potassium: 3.9 mmol/L (ref 3.5–5.1)
Sodium: 120 mmol/L — ABNORMAL LOW (ref 135–145)
Total Bilirubin: 1.2 mg/dL (ref 0.3–1.2)
Total Protein: 7.5 g/dL (ref 6.5–8.1)

## 2022-09-13 LAB — CBC WITH DIFFERENTIAL/PLATELET
Abs Immature Granulocytes: 0 10*3/uL (ref 0.00–0.07)
Basophils Absolute: 0 10*3/uL (ref 0.0–0.1)
Basophils Relative: 0 %
Eosinophils Absolute: 0 10*3/uL (ref 0.0–0.5)
Eosinophils Relative: 0 %
HCT: 33.6 % — ABNORMAL LOW (ref 39.0–52.0)
Hemoglobin: 11.2 g/dL — ABNORMAL LOW (ref 13.0–17.0)
Lymphocytes Relative: 3 %
Lymphs Abs: 0.8 10*3/uL (ref 0.7–4.0)
MCH: 26.9 pg (ref 26.0–34.0)
MCHC: 33.3 g/dL (ref 30.0–36.0)
MCV: 80.6 fL (ref 80.0–100.0)
Monocytes Absolute: 2.5 10*3/uL — ABNORMAL HIGH (ref 0.1–1.0)
Monocytes Relative: 9 %
Neutro Abs: 24.7 10*3/uL — ABNORMAL HIGH (ref 1.7–7.7)
Neutrophils Relative %: 88 %
Platelets: 491 10*3/uL — ABNORMAL HIGH (ref 150–400)
RBC: 4.17 MIL/uL — ABNORMAL LOW (ref 4.22–5.81)
RDW: 13.7 % (ref 11.5–15.5)
WBC: 28.1 10*3/uL — ABNORMAL HIGH (ref 4.0–10.5)
nRBC: 0 % (ref 0.0–0.2)
nRBC: 0 /100 WBC

## 2022-09-13 LAB — PROTIME-INR
INR: 1.3 — ABNORMAL HIGH (ref 0.8–1.2)
Prothrombin Time: 15.9 seconds — ABNORMAL HIGH (ref 11.4–15.2)

## 2022-09-13 LAB — I-STAT CG4 LACTIC ACID, ED
Lactic Acid, Venous: 2.8 mmol/L (ref 0.5–1.9)
Lactic Acid, Venous: 1.8 mmol/L (ref 0.5–1.9)

## 2022-09-13 LAB — APTT: aPTT: 33 seconds (ref 24–36)

## 2022-09-13 LAB — GLUCOSE, CAPILLARY: Glucose-Capillary: 275 mg/dL — ABNORMAL HIGH (ref 70–99)

## 2022-09-13 MED ORDER — ONDANSETRON HCL 4 MG/2ML IJ SOLN: 4.0000 mg | Freq: Four times a day (QID) | INTRAMUSCULAR | Status: DC | PRN

## 2022-09-13 MED ORDER — FENTANYL CITRATE PF 50 MCG/ML IJ SOSY: 25.0000 ug | PREFILLED_SYRINGE | INTRAMUSCULAR | Status: DC | PRN

## 2022-09-13 MED ORDER — ACETAMINOPHEN 650 MG RE SUPP: 650.0000 mg | Freq: Four times a day (QID) | RECTAL | Status: DC | PRN

## 2022-09-13 MED ORDER — METRONIDAZOLE 500 MG/100ML IV SOLN
500.0000 mg | Freq: Two times a day (BID) | INTRAVENOUS | Status: DC
  Administered 2022-09-13 – 2022-09-20 (×13): 500 mg via INTRAVENOUS
  Filled 2022-09-13 (×13): qty 100

## 2022-09-13 MED ORDER — LACTATED RINGERS IV BOLUS
1000.0000 mL | Freq: Once | INTRAVENOUS | Status: AC
  Administered 2022-09-13: 1000 mL via INTRAVENOUS

## 2022-09-13 MED ORDER — LACTATED RINGERS IV SOLN: INTRAVENOUS | Status: AC

## 2022-09-13 MED ORDER — VANCOMYCIN HCL 1750 MG/350ML IV SOLN
1750.0000 mg | Freq: Once | INTRAVENOUS | Status: AC
  Administered 2022-09-13: 1750 mg via INTRAVENOUS
  Filled 2022-09-13: qty 350

## 2022-09-13 MED ORDER — MELATONIN 3 MG PO TABS
3.0000 mg | ORAL_TABLET | Freq: Every evening | ORAL | Status: DC | PRN
  Administered 2022-09-17 – 2022-09-23 (×7): 3 mg via ORAL
  Filled 2022-09-13 (×7): qty 1

## 2022-09-13 MED ORDER — INSULIN ASPART 100 UNIT/ML IJ SOLN
0.0000 [IU] | Freq: Four times a day (QID) | INTRAMUSCULAR | Status: DC
  Administered 2022-09-13 – 2022-09-14 (×3): 5 [IU] via SUBCUTANEOUS
  Administered 2022-09-15 (×2): 3 [IU] via SUBCUTANEOUS
  Administered 2022-09-15 (×2): 5 [IU] via SUBCUTANEOUS
  Administered 2022-09-16: 3 [IU] via SUBCUTANEOUS
  Administered 2022-09-16 (×2): 2 [IU] via SUBCUTANEOUS
  Administered 2022-09-16 – 2022-09-17 (×3): 3 [IU] via SUBCUTANEOUS
  Administered 2022-09-17: 2 [IU] via SUBCUTANEOUS
  Administered 2022-09-18: 3 [IU] via SUBCUTANEOUS
  Administered 2022-09-18: 5 [IU] via SUBCUTANEOUS
  Administered 2022-09-18 – 2022-09-19 (×4): 2 [IU] via SUBCUTANEOUS
  Administered 2022-09-19 (×2): 3 [IU] via SUBCUTANEOUS
  Administered 2022-09-19: 2 [IU] via SUBCUTANEOUS
  Administered 2022-09-20: 5 [IU] via SUBCUTANEOUS
  Administered 2022-09-20 – 2022-09-21 (×2): 3 [IU] via SUBCUTANEOUS
  Administered 2022-09-21: 2 [IU] via SUBCUTANEOUS
  Administered 2022-09-21: 3 [IU] via SUBCUTANEOUS

## 2022-09-13 MED ORDER — ACETAMINOPHEN 325 MG PO TABS
650.0000 mg | ORAL_TABLET | Freq: Four times a day (QID) | ORAL | Status: DC | PRN
  Administered 2022-09-13 – 2022-09-19 (×8): 650 mg via ORAL
  Filled 2022-09-13 (×8): qty 2

## 2022-09-13 MED ORDER — NALOXONE HCL 0.4 MG/ML IJ SOLN: 0.4000 mg | INTRAMUSCULAR | Status: DC | PRN

## 2022-09-13 MED ORDER — LACTATED RINGERS IV BOLUS (SEPSIS): 1000.0000 mL | Freq: Once | INTRAVENOUS | Status: DC

## 2022-09-13 MED ORDER — VANCOMYCIN HCL 1500 MG/300ML IV SOLN: 1500.0000 mg | INTRAVENOUS | Status: DC

## 2022-09-13 MED ORDER — SODIUM CHLORIDE 0.9 % IV SOLN
2.0000 g | Freq: Two times a day (BID) | INTRAVENOUS | Status: DC
  Administered 2022-09-13 – 2022-09-14 (×2): 2 g via INTRAVENOUS
  Filled 2022-09-13 (×2): qty 12.5

## 2022-09-13 MED ORDER — SODIUM CHLORIDE 0.9 % IV BOLUS
1000.0000 mL | Freq: Once | INTRAVENOUS | Status: AC
  Administered 2022-09-13: 1000 mL via INTRAVENOUS

## 2022-09-13 MED ORDER — VANCOMYCIN HCL IN DEXTROSE 1-5 GM/200ML-% IV SOLN: 1000.0000 mg | Freq: Once | INTRAVENOUS | Status: DC

## 2022-09-13 MED ORDER — ACETAMINOPHEN 500 MG PO TABS
1000.0000 mg | ORAL_TABLET | ORAL | Status: AC
  Administered 2022-09-13: 1000 mg via ORAL
  Filled 2022-09-13: qty 2

## 2022-09-13 NOTE — Progress Notes (Deleted)
Office Note     CC: Left foot wound Requesting Provider:  Billie Lade, MD  HPI: Grant Yang is a 67 y.o. (1955-07-19) male presenting in follow up after PT angioplasty for LLE CLI with tissue loss.  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 Lajoyce Corners.   ***  On exam, Grant Yang was doing well.  A native of [place], he now lives in [place].  He was a Engineer, structural by trade, but is now retired.  After his divorce in 2010, Grant Yang stopped smoking, as well as stopped using crystal meth.    Grant Yang has had type 2 diabetes for quite some time, fortunately this has been poorly managed.  His last A1c was 14 a few weeks ago.  He is working to bring this down.  Denies symptoms of claudication, ischemic rest pain    The pt is  on a statin for cholesterol management.  The pt is  on a daily aspirin.   Other AC:  - The pt is  on medication for hypertension.   The pt is  diabetic.  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 in [place] . denies any residual deficits     Past Surgical History:  Procedure Laterality Date   ABDOMINAL AORTOGRAM W/LOWER EXTREMITY N/A 07/31/2022   Procedure: ABDOMINAL AORTOGRAM W/LOWER EXTREMITY;  Surgeon: Leonie Douglas, MD;  [facility] INVASIVE CV LAB;  Service: Cardiovascular;  Laterality: N/A;   AMPUTATION Left 05/31/2022   Procedure: LEFT 2ND TOE AMPUTATION;  Surgeon: Nadara Mustard, MD;  Location: [facility] OR;  Service: Orthopedics;  Laterality: Left;   APPENDECTOMY     CHOLECYSTECTOMY  12/30/2010   Procedure:  LAPAROSCOPIC CHOLECYSTECTOMY;  Surgeon: Dalia Heading;  Location: [facility] ORS;  Service: General;  Laterality: N/A;   I & D EXTREMITY Left 05/31/2022   Procedure: LEFT FOOT DEBRIDEMENT;  Surgeon: Nadara Mustard, MD;  Location: [facility] OR;  Service: Orthopedics;  Laterality: Left;   INCISION AND DRAINAGE ABSCESS Left 02/01/2017   Procedure: INCISION AND DRAINAGE ABSCESS LEFT HAND;  Surgeon: Vickki Hearing, MD;  Location: [facility] ORS;  Service: Orthopedics;  Laterality: Left;   LOWER EXTREMITY ANGIOGRAPHY N/A 08/03/2022   Procedure: Lower Extremity Angiography;  Surgeon: Cephus Shelling, MD;  Location: [facility] INVASIVE CV LAB;  Service: Cardiovascular;  Laterality: N/A;   LYMPHADENECTOMY Bilateral 08/02/2017   Procedure: LYMPHADENECTOMY, PELVIC;  Surgeon: Heloise Purpura, MD;  Location: [facility] ORS;  Service: Urology;  Laterality: Bilateral;   PERIPHERAL VASCULAR BALLOON ANGIOPLASTY  08/03/2022   Procedure: PERIPHERAL VASCULAR BALLOON ANGIOPLASTY;  Surgeon: Cephus Shelling, MD;  [facility];  Service: Cardiovascular;;   ROBOT ASSISTED LAPAROSCOPIC RADICAL PROSTATECTOMY N/A 08/02/2017   Procedure: XI ROBOTIC ASSISTED LAPAROSCOPIC RADICAL PROSTATECTOMY LEVEL 2;  Surgeon: Heloise Purpura, MD;  Location: [facility] ORS;  Service: Urology;  Laterality: N/A;    Social History   Socioeconomic History   Marital status: Divorced  Spouse name: Not on file   Number of children: Not on file   Years of education: Not on file   Highest education level: Not on file  Occupational History   Not on file  Tobacco Use   Smoking status: Former    Current packs/day: 0.00    Average packs/day: 2.0 packs/day for 38.0 years (76.0 ttl pk-yrs)    Types: Cigarettes    Start date: 42    Quit date: 2010    Years since quitting: 14.5   Smokeless tobacco: Never  Substance and Sexual Activity   Alcohol use: No   Drug use: Yes    Types: Marijuana    Comment: occ   Sexual activity: Yes  Other Topics Concern   Not  on file  Social History Narrative   Not on file   Social Determinants of Health   Financial Resource Strain: Not on file  Food Insecurity: No Food Insecurity (07/30/2022)   Hunger Vital Sign    Worried About Running Out of Food in the Last Year: Never true    Ran Out of Food in the Last Year: Never true  Transportation Needs: No Transportation Needs (07/30/2022)   PRAPARE - Administrator, Civil Service (Medical): No    Lack of Transportation (Non-Medical): No  Physical Activity: Not on file  Stress: Not on file  Social Connections: Not on file  Intimate Partner Violence: Not At Risk (07/30/2022)   Humiliation, Afraid, Rape, and Kick questionnaire    Fear of Current or Ex-Partner: No    Emotionally Abused: No    Physically Abused: No    Sexually Abused: No   Family History  Problem Relation Age of Onset   Heart failure Mother    Cancer Father     Current Outpatient Medications  Medication Sig Dispense Refill   albuterol (VENTOLIN HFA) 108 (90 Base) MCG/ACT inhaler Inhale 2 puffs into the lungs every 6 (six) hours as needed for wheezing or shortness of breath. 8 g 0   aspirin EC 81 MG tablet Take 1 tablet (81 mg total) by mouth daily. Swallow whole. 30 tablet 12   atorvastatin (LIPITOR) 40 MG tablet Take 1 tablet (40 mg total) by mouth daily. 90 tablet 3   blood glucose meter kit and supplies Dispense based on patient and insurance preference. Use up to four times daily as directed. (FOR ICD-10 E10.9, E11.9). 1 each 0   clopidogrel (PLAVIX) 75 MG tablet Take 1 tablet (75 mg total) by mouth daily with breakfast. 30 tablet 3   Continuous Blood Gluc Sensor (DEXCOM G7 SENSOR) MISC 1 Units by Does not apply route See admin instructions. Change sensor after 10 days 3 each 2   EPINEPHrine 0.3 mg/0.3 mL IJ SOAJ injection Inject 0.3 mg into the muscle once as needed for up to 1 dose (if patient exhibits significant signs and symptoms of allergic reaction.). 1 each 0   gabapentin  (NEURONTIN) 300 MG capsule Take 1 capsule (300 mg total) by mouth 3 (three) times daily as needed. (Patient taking differently: Take 300 mg by mouth 3 (three) times daily as needed (Neuropathy).) 180 capsule 1   ibuprofen (ADVIL) 200 MG tablet Take 400 mg by mouth 2 (two) times daily as needed for headache or moderate pain.     insulin NPH-regular Human (NOVOLIN 70/30) (70-30) 100 UNIT/ML injection Inject 10 Units into the skin 2 (two) times daily with a meal. (Patient taking differently: Inject 10 Units into the skin  with breakfast, with lunch, and with evening meal.) 10 mL 1   Insulin Syringes, Disposable, U-100 0.3 ML MISC 10 Units by Does not apply route 2 (two) times daily with breakfast and lunch. 60 each 0   metFORMIN (GLUCOPHAGE-XR) 500 MG 24 hr tablet Take 500 mg by mouth once daily x1 week, then increase to 500 mg twice daily x1 week, then 1,000 mg in the AM and 500 mg in the PM x1 week, and finally 1,000 mg twice daily. Take with food. (Patient taking differently: Take 1,000 mg by mouth 2 (two) times daily.) 90 tablet 1   olmesartan (BENICAR) 20 MG tablet Take 1 tablet (20 mg total) by mouth daily. 30 tablet 2   tirzepatide (MOUNJARO) 2.5 MG/0.5ML Pen Inject 2.5 mg into the skin once a week. 2 mL 0   No current facility-administered medications for this visit.    Allergies  Allergen Reactions   Penicillins Other (See Comments)    Unknown reaction during childhood     REVIEW OF SYSTEMS:  [X]  denotes positive finding, [ ]  denotes negative finding Cardiac  Comments:  Chest pain or chest pressure:    Shortness of breath upon exertion:    Short of breath when lying flat:    Irregular heart rhythm:        Vascular    Pain in calf, thigh, or hip brought on by ambulation:    Pain in feet at night that wakes you up from your sleep:     Blood clot in your veins:    Leg swelling:         Pulmonary    Oxygen at home:    Productive cough:     Wheezing:         Neurologic    Sudden  weakness in arms or legs:     Sudden numbness in arms or legs:     Sudden onset of difficulty speaking or slurred speech:    Temporary loss of vision in one eye:     Problems with dizziness:         Gastrointestinal    Blood in stool:     Vomited blood:         Genitourinary    Burning when urinating:     Blood in urine:        Psychiatric    Major depression:         Hematologic    Bleeding problems:    Problems with blood clotting too easily:        Skin    Rashes or ulcers:        Constitutional    Fever or chills:      PHYSICAL EXAMINATION:  There were no vitals filed for this visit.  General:  WDWN in NAD; vital signs documented above Gait: Not observed HENT: WNL, normocephalic Pulmonary: normal non-labored breathing , without wheezing Cardiac: regular HR Abdomen: soft, NT, no masses Skin: without rashes Vascular Exam/Pulses:  Right Left  Radial 2+ (normal) 2+ (normal)  Ulnar    Femoral    Popliteal    DP 2+ (normal) 2+ (normal)  PT     Extremities: with ischemic changes, without Gangrene , without cellulitis; with open wounds;      Musculoskeletal: no muscle wasting or atrophy  Neurologic: A&O X 3;  No focal weakness or paresthesias are detected Psychiatric:  The pt has Normal affect.   Non-Invasive Vascular Imaging:   +-------+-----------+-----------+------------+------------+  ABI/TBIToday's ABIToday's TBIPrevious ABIPrevious  TBI  +-------+-----------+-----------+------------+------------+  Right 1.42       0.67                                 +-------+-----------+-----------+------------+------------+  Left  1.07       0.38                                 +-------+-----------+-----------+------------+------------+     ASSESSMENT/PLAN: Grant Yang is a 67 y.o. male presenting with critical limb ischemia in the left lower extremity with tissue loss in the lateral aspect of the foot as well as under the first toe.   Fortunately, his second toe ray amputation has healed.  The wound on the lateral aspect of the foot appears like it could have been pressure induced from a dressing, however it continues to worsen.  He states the wound under his first toe is unchanged. ABIs demonstrate relatively normal waveforms to the level of the ankle, with poor toe pressure, concerning for severe small vessel disease which is common in patients with longstanding diabetes.  We discussed the above at length.  He is aware there is minimal tissue between the dermis and underlying fifth metatarsal.  With his longstanding history of uncontrolled diabetes, and nonhealing wound, he would benefit from left lower extremity angiogram in an effort to define and improve distal perfusion for wound healing.    I asked that he continue his current medication regimen. I discussed the risk and benefits of left lower extremity angiogram in an effort to define and improve distal perfusion for wound healing, Kavi elected to proceed.    Victorino Sparrow, MD [facility] [phone]

## 2022-09-13 NOTE — ED Triage Notes (Signed)
Pt via POV sent from wound care clinic for concern of worsened infection to left foot wound. Pt was told to expect surgical debridement or possible amputation. Previously seen at [facility] on 7/15 and left AMA without receiving recommended treatment after prolonged period of medication noncompliance. Pt denies pain today but wound smells foul with dressings in place.

## 2022-09-13 NOTE — ED Notes (Signed)
ED TO INPATIENT HANDOFF REPORT  ED Nurse Name and Phone #:  Rodney Booze [phone]  S Name/Age/Gender Gevena Yang 67 y.o. male Room/Bed: 003C/003C  Code Status   Code Status: Full Code  Home/SNF/Other Home Patient oriented to: self, place, time, and situation Is this baseline? Yes   Triage Complete: Triage complete  Chief Complaint Osteomyelitis of left lower extremity (HCC) [M86.9]  Triage Note Pt via POV sent from wound care clinic for concern of worsened infection to left foot wound. Pt was told to expect surgical debridement or possible amputation. Previously seen at [facility] on 7/15 and left AMA without receiving recommended treatment after prolonged period of medication noncompliance. Pt denies pain today but wound smells foul with dressings in place.    Allergies Allergies  Allergen Reactions   Penicillins Other (See Comments)    Unknown reaction during childhood    Level of Care/Admitting Diagnosis ED Disposition     ED Disposition  Admit   Condition  --   Comment  Hospital Area: [facility] [[id number]]  Level of Care: Telemetry Medical [104]  May admit patient to [facility] or [facility] if equivalent level of care is available:: No  Covid Evaluation: Asymptomatic - no recent exposure (last 10 days) testing not required  Diagnosis: Osteomyelitis of left lower extremity [facility]) [[id number]]  Admitting Physician: Angie Fava [[id number]]  Attending Physician: Angie Fava [[id number]]  Certification:: I certify this patient will need inpatient services for at least 2 midnights  Estimated Length of Stay: 2          B Medical/Surgery History Past Medical History:  Diagnosis Date   Cancer (HCC)    prostate cancer    Diabetes mellitus    GERD (gastroesophageal reflux disease)    History of kidney stones    Hypertension    Neuropathy in diabetes (HCC)    bilateral lower extremities    Shortness of breath    patient states it  hurts to breath; this was when he use to smoke cigarettes    Stroke [facility]) 2010   "mini stroke" patient cant remember when; states it happened in w[place] in 2010 . denies any residual deficits    Past Surgical History:  Procedure Laterality Date   ABDOMINAL AORTOGRAM W/LOWER EXTREMITY N/A 07/31/2022   Procedure: ABDOMINAL AORTOGRAM W/LOWER EXTREMITY;  Surgeon: Leonie Douglas, MD;  [facility] INVASIVE CV LAB;  Service: Cardiovascular;  Laterality: N/A;   AMPUTATION Left 05/31/2022   Procedure: LEFT 2ND TOE AMPUTATION;  Surgeon: Nadara Mustard, MD;  Location: [facility] OR;  Service: Orthopedics;  Laterality: Left;   APPENDECTOMY     CHOLECYSTECTOMY  12/30/2010   Procedure: LAPAROSCOPIC CHOLECYSTECTOMY;  Surgeon: Dalia Heading;  Location: [facility] ORS;  Service: General;  Laterality: N/A;   I & D EXTREMITY Left 05/31/2022   Procedure: LEFT FOOT DEBRIDEMENT;  Surgeon: Nadara Mustard, MD;  Location: [facility] OR;  Service: Orthopedics;  Laterality: Left;   INCISION AND DRAINAGE ABSCESS Left 02/01/2017   Procedure: INCISION AND DRAINAGE ABSCESS LEFT HAND;  Surgeon: Vickki Hearing, MD;  Location: [facility] ORS;  Service: Orthopedics;  Laterality: Left;   LOWER EXTREMITY ANGIOGRAPHY N/A 08/03/2022   Procedure: Lower Extremity Angiography;  Surgeon: Cephus Shelling, MD;  Location: [facility] INVASIVE CV LAB;  Service: Cardiovascular;  Laterality: N/A;   LYMPHADENECTOMY Bilateral 08/02/2017   Procedure: LYMPHADENECTOMY, PELVIC;  Surgeon: Heloise Purpura, MD;  Location: [facility] ORS;  Service: Urology;  Laterality: Bilateral;  PERIPHERAL VASCULAR BALLOON ANGIOPLASTY  08/03/2022   Procedure: PERIPHERAL VASCULAR BALLOON ANGIOPLASTY;  Surgeon: Cephus Shelling, MD;  [facility];  Service: Cardiovascular;;   ROBOT ASSISTED LAPAROSCOPIC RADICAL PROSTATECTOMY N/A 08/02/2017   Procedure: XI ROBOTIC ASSISTED LAPAROSCOPIC RADICAL PROSTATECTOMY LEVEL 2;  Surgeon: Heloise Purpura, MD;  Location: [facility] ORS;  Service: Urology;   Laterality: N/A;     A IV Location/Drains/Wounds Patient Lines/Drains/Airways Status     Active Line/Drains/Airways     Name Placement date Placement time Site Days   Peripheral IV 09/13/22 20 G Anterior;Right Forearm 09/13/22  1921  Forearm  less than 1   Peripheral IV 09/13/22 20 G Left Antecubital 09/13/22  1932  Antecubital  less than 1   Incision - 6 Ports Abdomen Left;Lateral;Lower Left;Lateral;Upper Umbilicus;Superior Right;Lateral;Lower Right;Medial;Upper Upper;Lateral 08/02/17  1145  -- 1868   Wound / Incision (Open or Dehisced) 07/30/22 Other (Comment) Left;Lateral Ulcer to the left foot located on the lateral side. 07/30/22  2238  --  45            Intake/Output Last 24 hours No intake or output data in the 24 hours ending 09/13/22 2018  Labs/Imaging Results for orders placed or performed during the hospital encounter of 09/13/22 (from the past 48 hour(s))  Comprehensive metabolic panel     Status: Abnormal   Collection Time: 09/13/22  5:33 PM  Result Value Ref Range   Sodium 120 (L) 135 - 145 mmol/L   Potassium 3.9 3.5 - 5.1 mmol/L   Chloride 83 (L) 98 - 111 mmol/L   CO2 19 (L) 22 - 32 mmol/L   Glucose, Bld 350 (H) 70 - 99 mg/dL    Comment: Glucose reference range applies only to samples taken after fasting for at least 8 hours.   BUN 41 (H) 8 - 23 mg/dL   Creatinine, Ser 0.93 (H) 0.61 - 1.24 mg/dL   Calcium 8.9 8.9 - 81.8 mg/dL   Total Protein 7.5 6.5 - 8.1 g/dL   Albumin 2.2 (L) 3.5 - 5.0 g/dL   AST 31 15 - 41 U/L   ALT 25 0 - 44 U/L   Alkaline Phosphatase 161 (H) 38 - 126 U/L   Total Bilirubin 1.2 0.3 - 1.2 mg/dL   GFR, Estimated 50 (L) >60 mL/min    Comment: (NOTE) Calculated using the CKD-EPI Creatinine Equation (2021)    Anion gap 18 (H) 5 - 15    Comment: Performed at [facility], [address], [place], [place] [address]  CBC with Differential     Status: Abnormal   Collection Time: 09/13/22  5:33 PM  Result Value Ref Range   WBC 28.1  (H) 4.0 - 10.5 K/uL   RBC 4.17 (L) 4.22 - 5.81 MIL/uL   Hemoglobin 11.2 (L) 13.0 - 17.0 g/dL   HCT 16.9 (L) 67.8 - 93.8 %   MCV 80.6 80.0 - 100.0 fL   MCH 26.9 26.0 - 34.0 pg   MCHC 33.3 30.0 - 36.0 g/dL   RDW 10.1 75.1 - 02.5 %   Platelets 491 (H) 150 - 400 K/uL   nRBC 0.0 0.0 - 0.2 %   Neutrophils Relative % 88 %   Neutro Abs 24.7 (H) 1.7 - 7.7 K/uL   Lymphocytes Relative 3 %   Lymphs Abs 0.8 0.7 - 4.0 K/uL   Monocytes Relative 9 %   Monocytes Absolute 2.5 (H) 0.1 - 1.0 K/uL   Eosinophils Relative 0 %  Eosinophils Absolute 0.0 0.0 - 0.5 K/uL   Basophils Relative 0 %   Basophils Absolute 0.0 0.0 - 0.1 K/uL   nRBC 0 0 /100 WBC   Abs Immature Granulocytes 0.00 0.00 - 0.07 K/uL    Comment: Performed at [facility], [address], [place], [place] [address]  Protime-INR     Status: Abnormal   Collection Time: 09/13/22  6:49 PM  Result Value Ref Range   Prothrombin Time 15.9 (H) 11.4 - 15.2 seconds   INR 1.3 (H) 0.8 - 1.2    Comment: (NOTE) INR goal varies based on device and disease states. Performed at [facility], [address] [place], [place] [address]   APTT     Status: None   Collection Time: 09/13/22  6:49 PM  Result Value Ref Range   aPTT 33 24 - 36 seconds    Comment: Performed at [facility], [address], [place], [place] [address]  I-Stat Lactic Acid, ED     Status: Abnormal   Collection Time: 09/13/22  6:55 PM  Result Value Ref Range   Lactic Acid, Venous 2.8 (HH) 0.5 - 1.9 mmol/L   Comment NOTIFIED PHYSICIAN   I-Stat Lactic Acid, ED     Status: None   Collection Time: 09/13/22  7:32 PM  Result Value Ref Range   Lactic Acid, Venous 1.8 0.5 - 1.9 mmol/L   DG Chest Port 1 View  Result Date: 09/13/2022 CLINICAL DATA:  Questionable sepsis - evaluate for abnormality EXAM: PORTABLE CHEST 1 VIEW COMPARISON:  12/28/2010 FINDINGS: The heart size and mediastinal contours are within normal limits. Both lungs are clear. The visualized  skeletal structures are unremarkable. IMPRESSION: No active disease. Electronically Signed   By: Charlett Nose M.D.   On: 09/13/2022 19:18    Pending Labs Unresulted Labs (From admission, onward)     Start     Ordered   09/14/22 0500  CBC with Differential/Platelet  Tomorrow morning,   R        09/13/22 2010   09/14/22 0500  Comprehensive metabolic panel  Tomorrow morning,   R        09/13/22 2010   09/14/22 0500  Magnesium  Tomorrow morning,   R        09/13/22 2010   09/13/22 2016  Hemoglobin A1c  Add-on,   AD       Comments: To assess prior glycemic control    09/13/22 2016   09/13/22 2010  Magnesium  Add-on,   AD        09/13/22 2010   09/13/22 1849  Resp panel by RT-PCR (RSV, Flu A&B, Covid) Anterior Nasal Swab  (Septic presentation on arrival (screening labs, nursing and treatment orders for obvious sepsis))  Once,   URGENT        09/13/22 1849   09/13/22 1849  Blood Culture (routine x 2)  (Septic presentation on arrival (screening labs, nursing and treatment orders for obvious sepsis))  BLOOD CULTURE X 2,   STAT      09/13/22 1849   09/13/22 1717  Urinalysis, Routine w reflex microscopic -Urine, Clean Catch  Once,   URGENT       Question Answer Comment  Specimen Source Urine, Clean Catch   Release to patient Immediate      09/13/22 1716            Vitals/Pain Today's Vitals   09/13/22 1815 09/13/22 1932 09/13/22 1940 09/13/22  1945  BP: 115/79  113/62 (!) 114/57  Pulse: (!) 104  92 92  Resp:   (!) 28 (!) 29  Temp:   (!) 100.4 F (38 C)   TempSrc:   Oral   SpO2: 100%  98% 100%  Weight:      Height:      PainSc:  7       Isolation Precautions No active isolations  Medications Medications  vancomycin (VANCOREADY) IVPB 1750 mg/350 mL (1,750 mg Intravenous New Bag/Given 09/13/22 1905)  sodium chloride 0.9 % bolus 1,000 mL (has no administration in time range)  acetaminophen (TYLENOL) tablet 650 mg (has no administration in time range)    Or  acetaminophen  (TYLENOL) suppository 650 mg (has no administration in time range)  melatonin tablet 3 mg (has no administration in time range)  ondansetron (ZOFRAN) injection 4 mg (has no administration in time range)  lactated ringers infusion (has no administration in time range)  naloxone (NARCAN) injection 0.4 mg (has no administration in time range)  fentaNYL (SUBLIMAZE) injection 25 mcg (has no administration in time range)  ceFEPIme (MAXIPIME) 2 g in sodium chloride 0.9 % 100 mL IVPB (has no administration in time range)  metroNIDAZOLE (FLAGYL) IVPB 500 mg (has no administration in time range)  insulin aspart (novoLOG) injection 0-9 Units (has no administration in time range)  acetaminophen (TYLENOL) tablet 1,000 mg (1,000 mg Oral Given 09/13/22 1849)  lactated ringers bolus 1,000 mL (1,000 mLs Intravenous New Bag/Given 09/13/22 1851)    Mobility walks     Focused Assessments Cardiac Assessment Handoff:  Cardiac Rhythm: Normal sinus rhythm No results found for: "CKTOTAL", "CKMB", "CKMBINDEX", "TROPONINI" No results found for: "DDIMER" Does the Patient currently have chest pain? No    R Recommendations: See Admitting Provider Note  Report given to:   Additional Notes:

## 2022-09-13 NOTE — Progress Notes (Signed)
Subjective:  Patient ID: Grant Yang, male    DOB: 1956-01-27,   MRN: [id number]  Chief Complaint  Patient presents with   Wound Check    np left foot wound on side of foot / hx of toe amputation. Pt thinks the wound is infected.     67 y.o. male presents for concern of left foot wound that has been present for 5-6 weeks. Patient is concerned for infection. Patient has previously undergone second digit amputation with Dr. Lajoyce Corners and vascular intervention after lateral foot wound devloped. He was most recently in the hosptial on 7/15 and had MRI that showed abscess and osteomyelitis into the fifth metatarsal base and cuboid.  Patient did leave AMA prior to getting transported to [facility]. Relates the wound has been worsening and has been getting nausea and light headed.  Denies any other pedal complaints. Denies n/v/f/c.   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 [place] in 2010 . denies any residual deficits     Objective:  Physical Exam: Vascular: DP/PT pulses 2/4 bilateral. CFT <3 seconds. Normal hair growth on digits. No edema.  Skin. No lacerations or abrasions bilateral feet. Lateral foot wound with dranage present and surrounding erythema and necrosis. Fluctuant area noted to dorsum of foot likely abscess present.  Musculoskeletal: MMT 5/5 bilateral lower extremities in DF, PF, Inversion and Eversion. Deceased ROM in DF of ankle joint.  Neurological: Sensation intact to light touch.     MRI Left foot 09/04/22 IMPRESSION: 1. Large skin wound about the lateral aspect of the foot  extending from the cuboid to the base of the fifth metatarsal with peripherally enhancing fluid collection measuring at least 1.7 x 1.2 x 5.7 cm concerning for abscess. 2. Bone marrow edema of the cuboid, base of the fifth metatarsal, base of the proximal phalanx of the fifth digit concerning for osteomyelitis. 3. Mild marrow edema of the base of the 2nd-4th metatarsals without evidence of adjacent deep skin wound which may represent reactive edema secondary to altered mechanics. 4. Increased intramuscular signal of the plantar muscles suggesting myopathy/myositis.    Assessment:   1. Uncontrolled type 2 diabetes mellitus with hyperglycemia (HCC)   2. PAD (peripheral artery disease) (HCC)   3. Cellulitis and abscess of foot, except toes   4. Osteomyelitis of foot, left, acute (HCC)      Plan:  Patient was evaluated and treated and all questions answered. Ulcer lateral left foot with necrosis of bone and abscess.  -Dressed with betadine, DSD. -Given severity of infection and previous MRI and patient history discussed with patient that he would be best served by going to the emergency room. Discussed he needs IV antibiotics repeat MRI and likely will need Below knee amputation given severity of infection. -Will make on call doctor aware but patient will likely be best served with below knee amputation and he did express understanding to this.   -Patient expressed understanding and will proceed to the ED for admission.  No follow-ups on file.   Louann Sjogren, DPM

## 2022-09-13 NOTE — ED Provider Notes (Addendum)
[place] EMERGENCY DEPARTMENT AT [facility] Provider Note   CSN: [id number] Arrival date & time: 09/13/22  1640     History  Chief Complaint  Patient presents with   Wound Infection    Grant Yang is a 67 y.o. male.  HPI  Patient is a 67 year old male with past medical history significant for osteomyelitis of the left lower extremity, DM2, COPD, HTN, stroke   He is present emergency room today with complaints of left foot pain he states it is severe, constant pain he describes it as achy.  He does have some neuropathy at baseline.  He had left second toe amputation by Dr. Lajoyce Corners in April of this year and seems he has some issues with compliance with his antihyperglycemic medication.  Son who is at bedside states that patient had initially been admitted to [facility] to be transferred to [facility] however patient left AMA  Patient returns emergency room today complaining of continued pain in his left foot.     Home Medications Prior to Admission medications   Medication Sig Start Date End Date Taking? Authorizing Provider  albuterol (VENTOLIN HFA) 108 (90 Base) MCG/ACT inhaler Inhale 2 puffs into the lungs every 6 (six) hours as needed for wheezing or shortness of breath. 05/29/22   Gardenia Phlegm, MD  aspirin EC 81 MG tablet Take 1 tablet (81 mg total) by mouth daily. Swallow whole. 06/29/22   Billie Lade, MD  atorvastatin (LIPITOR) 40 MG tablet Take 1 tablet (40 mg total) by mouth daily. 06/29/22   Billie Lade, MD  blood glucose meter kit and supplies Dispense based on patient and insurance preference. Use up to four times daily as directed. (FOR ICD-10 E10.9, E11.9). 02/03/17   Langston Reusing, MD  clopidogrel (PLAVIX) 75 MG tablet Take 1 tablet (75 mg total) by mouth daily with breakfast. 08/05/22   Burnadette Pop, MD  Continuous Blood Gluc Sensor (DEXCOM G7 SENSOR) MISC 1 Units by Does not apply route See admin instructions. Change sensor after 10  days 06/05/22   Gardenia Phlegm, MD  EPINEPHrine 0.3 mg/0.3 mL IJ SOAJ injection Inject 0.3 mg into the muscle once as needed for up to 1 dose (if patient exhibits significant signs and symptoms of allergic reaction.). 06/02/22   Lanae Boast, MD  gabapentin (NEURONTIN) 300 MG capsule Take 1 capsule (300 mg total) by mouth 3 (three) times daily as needed. Patient taking differently: Take 300 mg by mouth 3 (three) times daily as needed (Neuropathy). 05/29/22   Gardenia Phlegm, MD  ibuprofen (ADVIL) 200 MG tablet Take 400 mg by mouth 2 (two) times daily as needed for headache or moderate pain.    [provider]  insulin NPH-regular Human (NOVOLIN 70/30) (70-30) 100 UNIT/ML injection Inject 10 Units into the skin 2 (two) times daily with a meal. Patient taking differently: Inject 10 Units into the skin with breakfast, with lunch, and with evening meal. 02/03/17   Langston Reusing, MD  Insulin Syringes, Disposable, U-100 0.3 ML MISC 10 Units by Does not apply route 2 (two) times daily with breakfast and lunch. 02/03/17   Langston Reusing, MD  metFORMIN (GLUCOPHAGE-XR) 500 MG 24 hr tablet Take 500 mg by mouth once daily x1 week, then increase to 500 mg twice daily x1 week, then 1,000 mg in the AM and 500 mg in the PM x1 week, and finally 1,000 mg twice daily. Take with food. Patient taking differently: Take  1,000 mg by mouth 2 (two) times daily. 05/29/22   Gardenia Phlegm, MD  olmesartan (BENICAR) 20 MG tablet Take 1 tablet (20 mg total) by mouth daily. 06/29/22 09/27/22  Billie Lade, MD  tirzepatide [facility]) 2.5 MG/0.5ML Pen Inject 2.5 mg into the skin once a week. 08/08/22   Billie Lade, MD      Allergies    Penicillins    Review of Systems   Review of Systems  Physical Exam Updated Vital Signs BP (!) 151/137 (BP Location: Right Arm)   Pulse (!) 106   Temp 98.7 F (37.1 C) (Oral)   Resp (!) 1   Ht 5\' 11"  (1.803 m)   Wt 88.9 kg   SpO2 97%   BMI 27.34 kg/m  Physical  Exam Vitals and nursing note reviewed.  Constitutional:      General: He is not in acute distress. HENT:     Head: Normocephalic and atraumatic.     Nose: Nose normal.     Mouth/Throat:     Mouth: Mucous membranes are moist.  Eyes:     General: No scleral icterus. Cardiovascular:     Rate and Rhythm: Normal rate and regular rhythm.     Pulses: Normal pulses.     Heart sounds: Normal heart sounds.  Pulmonary:     Effort: Pulmonary effort is normal. No respiratory distress.     Breath sounds: No wheezing.  Abdominal:     Palpations: Abdomen is soft.     Tenderness: There is no abdominal tenderness. There is no guarding or rebound.  Musculoskeletal:     Cervical back: Normal range of motion.     Right lower leg: No edema.     Left lower leg: Edema present.     Comments: Swelling, redness extending up the left leg and large infected wound to the left lateral foot see pictures below.  Foul-smelling and with purulence.  Skin:    General: Skin is warm and dry.     Capillary Refill: Capillary refill takes less than 2 seconds.  Neurological:     Mental Status: He is alert. Mental status is at baseline.  Psychiatric:        Mood and Affect: Mood normal.        Behavior: Behavior normal.        ED Results / Procedures / Treatments   Labs (all labs ordered are listed, but only abnormal results are displayed) Labs Reviewed  COMPREHENSIVE METABOLIC PANEL  CBC WITH DIFFERENTIAL/PLATELET  URINALYSIS, ROUTINE W REFLEX MICROSCOPIC  I-STAT CG4 LACTIC ACID, ED    EKG None  Radiology No results found.  Procedures .Critical Care  Performed by: Gailen Shelter, PA Authorized by: Gailen Shelter, PA   Critical care provider statement:    Critical care time (minutes):  35   Critical care time was exclusive of:  Separately billable procedures and treating other patients and teaching time   Critical care was necessary to treat or prevent imminent or life-threatening  deterioration of the following conditions:  Sepsis   Critical care was time spent personally by me on the following activities:  Development of treatment plan with patient or surrogate, review of old charts, re-evaluation of patient's condition, pulse oximetry, ordering and review of radiographic studies, ordering and review of laboratory studies, ordering and performing treatments and interventions, obtaining history from patient or surrogate, examination of patient and evaluation of patient's response to treatment   Care discussed with: admitting provider  Medications Ordered in ED Medications  acetaminophen (TYLENOL) tablet 1,000 mg (has no administration in time range)    ED Course/ Medical Decision Making/ A&P Clinical Course as of 09/13/22 1941  Wed Sep 13, 2022  1923 Sodium(!): 120 Sodium corrects to 124 [WF]    Clinical Course User Index [WF] Gailen Shelter, [place]                             Medical Decision Making Amount and/or Complexity of Data Reviewed Labs: ordered. Decision-making details documented in ED Course. Radiology: ordered. ECG/medicine tests: ordered.  Risk OTC drugs. Prescription drug management. Decision regarding hospitalization.   This patient presents to the ED for concern of wound, this involves a number of treatment options, and is a complaint that carries with it a moderate to high risk of complications and morbidity. A differential diagnosis was considered for the patient's symptoms which is discussed below:   Infection, abscess, osteomyelitis   Co morbidities: Discussed in HPI   Brief History:  Patient is a 67 year old male with past medical history significant for osteomyelitis of the left lower extremity, DM2, COPD, HTN, stroke   He is present emergency room today with complaints of left foot pain he states it is severe, constant pain he describes it as achy.  He does have some neuropathy at baseline.  He had left second toe  amputation by Dr. Lajoyce Corners in April of this year and seems he has some issues with compliance with his antihyperglycemic medication.  Son who is at bedside states that patient had initially been admitted to [facility] to be transferred to [facility] however patient left AMA  Patient returns emergency room today complaining of continued pain in his left foot.       EMR reviewed including pt PMHx, past surgical history and past visits to ER.   See HPI for more details   Lab Tests:   I ordered and independently interpreted labs. Labs notable for Leukocytosis of 28.1, differential w L shift.  Hyponatremia -very likely due to very little p.o. intake over the last week after AMA from [facility] and becoming more ill.  Imaging Studies:  Abnormal findings. I personally reviewed all imaging studies. Imaging notable for  L FOOT MRI  IMPRESSION: 1. Large skin wound about the lateral aspect of the foot extending from the cuboid to the base of the fifth metatarsal with peripherally enhancing fluid collection measuring at least 1.7 x 1.2 x 5.7 cm concerning for abscess. 2. Bone marrow edema of the cuboid, base of the fifth metatarsal, base of the proximal phalanx of the fifth digit concerning for osteomyelitis. 3. Mild marrow edema of the base of the 2nd-4th metatarsals without evidence of adjacent deep skin wound which may represent reactive edema secondary to altered mechanics. 4. Increased intramuscular signal of the plantar muscles suggesting myopathy/myositis.  Cardiac Monitoring:  The patient was maintained on a cardiac monitor.  I personally viewed and interpreted the cardiac monitored which showed an underlying rhythm of: sinus tach EKG non-ischemic sinus rhythm   Medicines ordered:  I ordered medication including vancomycin, Zosyn, lactated Ringer's, Tylenol for sepsis Reevaluation of the patient after these medicines showed that the patient improved I have reviewed the  patients home medicines and have made adjustments as needed   Critical Interventions:   empiric IV antibiotics   Consults/Attending Physician   I discussed this case with my attending physician who cosigned this  note including patient's presenting symptoms, physical exam, and planned diagnostics and interventions. Attending physician stated agreement with plan or made changes to plan which were implemented.  Dr. Arlean Hopping to admit.   Message sent to Dr. Lajoyce Corners and Susa Simmonds via epic messaging.   Reevaluation:  After the interventions noted above I re-evaluated patient and found that they have :improved   Social Determinants of Health:  The patient's social determinants of health were a factor in the care of this patient    Problem List / ED Course:  Patient with osteomyelitis of left foot.  MRI obtained at prior ER visit.  Febrile tachycardic and with leukocytosis and tachypnea meets sepsis criteria with presumptive source being left foot.  Direct message sent to Dr. Susa Simmonds of on-call orthopedic service as well as Dr. Lajoyce Corners.  Admitted to hospitalist service.  He has been administered IV vancomycin, cefepime, Flagyl   Dispostion:  After consideration of the diagnostic results and the patients response to treatment, I feel that the patent would benefit from admission.   Final Clinical Impression(s) / ED Diagnoses Final diagnoses:  None    Rx / DC Orders ED Discharge Orders     None         Gailen Shelter, [place] 09/13/22 2104    Gailen Shelter, [place] 09/13/22 2104    Bethann Berkshire, MD 09/15/22 1615

## 2022-09-13 NOTE — H&P (Signed)
History and Physical      Grant Yang UVO:[id number] DOB: 09-Jan-1956 DOA: 09/13/2022; DOS: 09/13/2022  PCP: Billie Lade, MD  Patient coming from: home   I have personally briefly reviewed patient's old medical records in [facility]  Chief Complaint: Left foot pain  HPI: Grant Yang is a 67 y.o. male with medical history significant for type 2 diabetes mellitus, essential hypertension, who is admitted to [facility] on 09/13/2022 with severe sepsis due to osteomyelitis of the left foot after presenting from home to [facility] ED complaining of left foot pain.   The patient was recently seen in the emergency department at [facility] on 09/04/2022, complaining at that time of worsening left foot wound.  On that date, he underwent MRI of the left foot with and without contrast, performed in [facility] emergency department, which was notable for large skin wound about the lateral aspect of the left foot extending from the cuboid to the base of the fifth metatarsal with peripherally enhancing fluid collection measuring at least 1.7 x 1.2 x 5.7 cm concerning for abscess, also demonstrating bone marrow edema of the cuboid, base of the fifth metatarsal, base of the proximal phalanx of the fifth digit, concerning for osteomyelitis, in the absence of any evidence of subcutaneous gas or acute fracture.  At that time, admission was recommended to the patient for further evaluation management of his suspected left foot osteomyelitis.  However, the patient subsequently left AMA on that date from [facility] emergency department, and has not sought additional medical care regarding his left foot until presenting to [facility] emergency department this evening.   The patient reports further progression of left foot discomfort associated with interval worsening of the left lateral foot wound, associated with progressive erythema over the lateral aspect of the left foot extending over the dorsal aspect  of the left foot, associated with interval increase in swelling.  He has not noted significant drainage, including no purulent drainage from the left foot wound.  Denies rash in any other location.  He acknowledges history of type 2 diabetes mellitus, with most recent hemoglobin A1c noted to be 14.3% in April 2024.  He conveys that this is complicated by diabetic peripheral polyneuropathy.  Relative to this baseline, denies any interval change in paresthesias involving the left lower extremity.   He also notes 3 to 4 days of subjective fever in the absence of chills, full body rigors, or generalized myalgias.  No recent chest pain, shortness of breath, palpitations, diaphoresis, dizziness, presyncope, or syncope.  He also denies any recent cough, neck stiffness, abdominal pain, diarrhea, dysuria or gross hematuria.  No recent orthopnea or PND.  Over the last 3 to 4 days, he is also noted interval decrease in oral intake of the food and water, as a consequence of interval decline in appetite over that timeframe.  Denies any associated nausea/vomiting.  He has previously followed with Dr. 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     ED Course:  Vital signs in the ED were notable for the following: Temperature max 100.4; heart rate initially in the low 100s, socially decreasing into the low 90s following interval IV fluids; systolic blood pressures in the 1 teens 150s; respiratory rate 15-20, oxygen saturation 97 to 100% on room air.  Labs were notable for the following: CMP notable for the following: Sodium 120, which corrects to approximately 124 when taking  into account concomitant hyperglycemia, which is relative to most recent prior serum sodium data point of 134 on 09/04/2022 and 138 on 08/16/2022, potassium 3.9, chloride 83, bicarbonate 19, anion gap 18, creatinine 1.54 compared to most recent prior serum creatinine data point of 1.13 on  09/04/2022, BUN and creatinine ratio greater than 20, glucose 350, calcium, corrected for mild hypoalbuminemia noted to be 10.3, albumin 2.2, otherwise, liver enzymes within normal limits.  Initial lactate 2.8, with repeat value trending down to 1.8.  CBC notable for white blood cell count 20,100 with 88% neutrophils, hemoglobin 11.2 relative to most recent prior value of 11.3 on 09/04/2022.  INR 1.3.  Urinalysis ordered, Therazole currently pending.  Blood cultures x 2 collected prior to initiation of IV antibiotics.  Per my interpretation, EKG in ED demonstrated the following: EKG shows sinus rhythm with heart rate 94, normal intervals, nonspecific T wave inversion in leads I, aVL, V2, and nonspecific less than 1 mm ST elevation limited to V1, without any additional ST changes noted.  Imaging in the ED, per corresponding formal radiology read, was notable for the following: Chest x-ray, 1 view, shows no evidence of acute cardiopulmonary process, Cleen evidence of infiltrate, edema, effusion, or pneumothorax.  EDP has contacted on-call orthopedic surgery, Dr. Susa Simmonds, requesting consultation.   While in the ED, the following were administered: Acetaminophen 1 g p.o. x 1, IV vancomycin, cefepime, Flagyl, lactated Ringer's x 1 L bolus, normal saline x 1 L bolus.  Subsequently, the patient was admitted for further evaluation management of severe sepsis due to osteomyelitis of the left foot, with presentation also notable for acute kidney injury, acute hyponatremia, anion gap metabolic acidosis as well as evidence of dehydration.     Review of Systems: As per HPI otherwise 10 point review of systems negative.   Past Medical History:  Diagnosis Date   Cancer [facility])    prostate cancer    Diabetes mellitus    GERD (gastroesophageal reflux disease)    History of kidney stones    Hypertension    Neuropathy in diabetes [facility])    bilateral lower extremities    Shortness of breath    patient states it  hurts to breath; this was when he use to smoke cigarettes    Stroke [facility]) 2010   "mini stroke" patient cant remember when; states it happened in w[place] in 2010 . denies any residual deficits     Past Surgical History:  Procedure Laterality Date   ABDOMINAL AORTOGRAM W/LOWER EXTREMITY N/A 07/31/2022   Procedure: ABDOMINAL AORTOGRAM W/LOWER EXTREMITY;  Surgeon: Leonie Douglas, MD;  [facility] INV[facility] CV LAB;  Service: Cardiovascular;  Laterality: N/A;   AMPUTATION Left 05/31/2022   Procedure: LEFT 2ND TOE AMPUTATION;  Surgeon: Nadara Mustard, MD;  Location: [facility] OR;  Service: Orthopedics;  Laterality: Left;   APPENDECTOMY     CHOLECYSTECTOMY  12/30/2010   Procedure: LAPAROSCOPIC CHOLECYSTECTOMY;  Surgeon: Dalia Heading;  Location: [facility] ORS;  Service: General;  Laterality: N/A;   I & D EXTREMITY Left 05/31/2022   Procedure: LEFT FOOT DEBRIDEMENT;  Surgeon: Nadara Mustard, MD;  Location: [facility] OR;  Service: Orthopedics;  Laterality: Left;   INCISION AND DRAINAGE ABSCESS Left 02/01/2017   Procedure: INCISION AND DRAINAGE ABSCESS LEFT HAND;  Surgeon: Vickki Hearing, MD;  Location: [facility] ORS;  Service: Orthopedics;  Laterality: Left;   LOWER EXTREMITY ANGIOGRAPHY N/A 08/03/2022   Procedure: Lower Extremity Angiography;  Surgeon: Cephus Shelling, MD;  Location:  [facility] INVASIVE CV LAB;  Service: Cardiovascular;  Laterality: N/A;   LYMPHADENECTOMY Bilateral 08/02/2017   Procedure: LYMPHADENECTOMY, PELVIC;  Surgeon: Heloise Purpura, MD;  Location: [facility] ORS;  Service: Urology;  Laterality: Bilateral;   PERIPHERAL VASCULAR BALLOON ANGIOPLASTY  08/03/2022   Procedure: PERIPHERAL VASCULAR BALLOON ANGIOPLASTY;  Surgeon: Cephus Shelling, MD;  Location: [facility] INVASIVE CV LAB;  Service: Cardiovascular;;   ROBOT ASSISTED LAPAROSCOPIC RADICAL PROSTATECTOMY N/A 08/02/2017   Procedure: XI ROBOTIC ASSISTED LAPAROSCOPIC RADICAL PROSTATECTOMY LEVEL 2;  Surgeon: Heloise Purpura, MD;  Location: [facility] ORS;  Service: Urology;   Laterality: N/A;    Social History:  reports that he quit smoking about 14 years ago. His smoking use included cigarettes. He started smoking about 52 years ago. He has a 76 pack-year smoking history. He has never used smokeless tobacco. He reports current drug use. Drug: Marijuana. He reports that he does not drink alcohol.   Allergies  Allergen Reactions   Penicillins Other (See Comments)    Unknown reaction during childhood    Family History  Problem Relation Age of Onset   Heart failure Mother    Cancer Father     Family history reviewed and not pertinent    Prior to Admission medications   Medication Sig Start Date End Date Taking? Authorizing Provider  aspirin EC 81 MG tablet Take 1 tablet (81 mg total) by mouth daily. Swallow whole. 06/29/22  Yes Billie Lade, MD  blood glucose meter kit and supplies Dispense based on patient and insurance preference. Use up to four times daily as directed. (FOR ICD-10 E10.9, E11.9). 02/03/17  Yes Langston Reusing, MD  clopidogrel (PLAVIX) 75 MG tablet Take 1 tablet (75 mg total) by mouth daily with breakfast. 08/05/22  Yes Adhikari, Willia Craze, MD  Continuous Blood Gluc Sensor (DEXCOM G7 SENSOR) MISC 1 Units by Does not apply route See admin instructions. Change sensor after 10 days 06/05/22  Yes Gardenia Phlegm, MD  EPINEPHrine 0.3 mg/0.3 mL IJ SOAJ injection Inject 0.3 mg into the muscle once as needed for up to 1 dose (if patient exhibits significant signs and symptoms of allergic reaction.). 06/02/22  Yes Lanae Boast, MD  gabapentin (NEURONTIN) 300 MG capsule Take 1 capsule (300 mg total) by mouth 3 (three) times daily as needed. Patient taking differently: Take 300 mg by mouth 3 (three) times daily as needed (Neuropathy). 05/29/22  Yes Gardenia Phlegm, MD  ibuprofen (ADVIL) 200 MG tablet Take 400 mg by mouth 2 (two) times daily as needed for headache or moderate pain.   Yes [provider]  insulin NPH-regular Human (NOVOLIN 70/30)  (70-30) 100 UNIT/ML injection Inject 10 Units into the skin 2 (two) times daily with a meal. Patient taking differently: Inject 3 Units into the skin 3 (three) times daily with meals. 02/03/17  Yes Langston Reusing, MD  Insulin Syringes, Disposable, U-100 0.3 ML MISC 10 Units by Does not apply route 2 (two) times daily with breakfast and lunch. 02/03/17  Yes Langston Reusing, MD  olmesartan (BENICAR) 20 MG tablet Take 1 tablet (20 mg total) by mouth daily. 06/29/22 09/27/22 Yes Billie Lade, MD  tirzepatide [facility]) 2.5 MG/0.5ML Pen Inject 2.5 mg into the skin once a week. 08/08/22  Yes Billie Lade, MD  albuterol (VENTOLIN HFA) 108 (90 Base) MCG/ACT inhaler Inhale 2 puffs into the lungs every 6 (six) hours as needed for wheezing or shortness of breath. Patient not taking: Reported on 09/13/2022 05/29/22   Barbaraann Faster,  Gasper Lloyd, MD  atorvastatin (LIPITOR) 40 MG tablet Take 1 tablet (40 mg total) by mouth daily. Patient not taking: Reported on 09/13/2022 06/29/22   Billie Lade, MD  metFORMIN (GLUCOPHAGE-XR) 500 MG 24 hr tablet Take 500 mg by mouth once daily x1 week, then increase to 500 mg twice daily x1 week, then 1,000 mg in the AM and 500 mg in the PM x1 week, and finally 1,000 mg twice daily. Take with food. Patient not taking: Reported on 09/13/2022 05/29/22   Gardenia Phlegm, MD     Objective    Physical Exam: Vitals:   09/13/22 1715 09/13/22 1815 09/13/22 1940 09/13/22 1945  BP:  115/79 113/62 (!) 114/57  Pulse:  (!) 104 92 92  Resp:   (!) 28 (!) 29  Temp:   (!) 100.4 F (38 C)   TempSrc:   Oral   SpO2:  100% 98% 100%  Weight: 88.9 kg     Height: 5\' 11"  (1.803 m)       General: appears to be stated age; alert, oriented Skin: warm, dry, 2 foot wounds noted on the lateral aspect of the left foot, with associated erythema extending over the dorsal surface of the left foot, associated with increased swelling, in the absence of any associated crepitus Head:  AT/Webb Mouth:  Oral  mucosa membranes appear dry, normal dentition Neck: supple; trachea midline Heart: Mildly tachycardic, but regular; did not appreciate any M/R/G Lungs: CTAB, did not appreciate any wheezes, rales, or rhonchi Abdomen: + BS; soft, ND, NT Extremities: no muscle wasting; left lateral foot wounds, with associated erythema, swelling, as further detailed above Neuro: strength and sensation intact in upper and lower extremities b/l   Labs on Admission: I have personally reviewed following labs and imaging studies  CBC: Recent Labs  Lab 09/13/22 1733  WBC 28.1*  NEUTROABS 24.7*  HGB 11.2*  HCT 33.6*  MCV 80.6  PLT 491*   Basic Metabolic Panel: Recent Labs  Lab 09/13/22 1733  NA 120*  K 3.9  CL 83*  CO2 19*  GLUCOSE 350*  BUN 41*  CREATININE 1.54*  CALCIUM 8.9   GFR: Estimated Creatinine Clearance: 50.9 mL/min (A) (by C-G formula based on SCr of 1.54 mg/dL (H)). Liver Function Tests: Recent Labs  Lab 09/13/22 1733  AST 31  ALT 25  ALKPHOS 161*  BILITOT 1.2  PROT 7.5  ALBUMIN 2.2*   No results for input(s): "LIPASE", "AMYLASE" in the last 168 hours. No results for input(s): "AMMONIA" in the last 168 hours. Coagulation Profile: Recent Labs  Lab 09/13/22 1849  INR 1.3*   Cardiac Enzymes: No results for input(s): "CKTOTAL", "CKMB", "CKMBINDEX", "TROPONINI" in the last 168 hours. BNP (last 3 results) No results for input(s): "PROBNP" in the last 8760 hours. HbA1C: No results for input(s): "HGBA1C" in the last 72 hours. CBG: No results for input(s): "GLUCAP" in the last 168 hours. Lipid Profile: No results for input(s): "CHOL", "HDL", "LDLCALC", "TRIG", "CHOLHDL", "LDLDIRECT" in the last 72 hours. Thyroid Function Tests: No results for input(s): "TSH", "T4TOTAL", "FREET4", "T3FREE", "THYROIDAB" in the last 72 hours. Anemia Panel: No results for input(s): "VITAMINB12", "FOLATE", "FERRITIN", "TIBC", "IRON", "RETICCTPCT" in the last 72 hours. Urine analysis:     Component Value Date/Time   COLORURINE YELLOW 12/28/2010 0813   APPEARANCEUR CLEAR 12/28/2010 0813   LABSPEC 1.025 12/28/2010 0813   PHURINE 5.0 12/28/2010 0813   GLUCOSEU >1000 (A) 12/28/2010 0813   HGBUR TRACE (A) 12/28/2010 0813  BILIRUBINUR NEGATIVE 12/28/2010 0813   KETONESUR NEGATIVE 12/28/2010 0813   PROTEINUR 100 (A) 12/28/2010 0813   UROBILINOGEN 0.2 12/28/2010 0813   NITRITE NEGATIVE 12/28/2010 0813   LEUKOCYTESUR NEGATIVE 12/28/2010 0813    Radiological Exams on Admission: DG Chest Port 1 View  Result Date: 09/13/2022 CLINICAL DATA:  Questionable sepsis - evaluate for abnormality EXAM: PORTABLE CHEST 1 VIEW COMPARISON:  12/28/2010 FINDINGS: The heart size and mediastinal contours are within normal limits. Both lungs are clear. The visualized skeletal structures are unremarkable. IMPRESSION: No active disease. Electronically Signed   By: Charlett Nose M.D.   On: 09/13/2022 19:18      Assessment/Plan   Principal Problem:   Osteomyelitis of left lower extremity (HCC) Active Problems:   Acute hyponatremia   Essential hypertension   Severe sepsis (HCC)   Penetrating foot wound, left, initial encounter   AKI (acute kidney injury) (HCC)   High anion gap metabolic acidosis   Dehydration   Acute prerenal azotemia   DM2 (diabetes mellitus, type 2) (HCC)      #) Severe sepsis due to acute osteomyelitis of the left foot: Diagnosis on the basis of progressive left lateral foot discomfort associated with progressive erythema, swelling, in the context of progressive diabetic foot wounds associated left lateral aspect of the left foot, and MRI of the left foot from 09/04/2022 showing bone marrow edema of the cuboid, base of the fifth metatarsal and base of the proximal phalanx of the fifth digit concerning for osteomyelitis, in the absence of any evidence of subcutaneous gas.  In addition to progression of left lateral diabetic foot wound, risk factors for patient's presenting  left foot infection include his history of underlying diabetes, which appears poorly controlled, and his further complicated by his history of diabetic peripheral polyneuropathy.  Of note, in the absence of radiographic evidence of subcutaneous gas, in the absence of crepitus on exam, presentation appears less suggestive of necrotizing fasciitis.  SIRS criteria met via leukocytosis with neutrophilic predominance, tachycardia, tachypnea. Lactic acid level: Initially 2.8, with repeat value trending down to 1.8. Of note, given the associated presence of suspected end organ damage in the form of concominant presenting lactic acidosis as well as acute kidney injury, criteria are met for pt's sepsis to be considered severe in nature. However, in the absence of lactic acid level that is greater than or equal to 4.0, and in the absence of any associated hypotension refractory to IVF's, there are no indications for administration of a 30 mL/kg IVF bolus at this time.   Additional ED work-up/management notable for: Blood cultures x 2 were collected prior to initiation of IV vancomycin, cefepime, and Flagyl, which will be continued for now in the setting of severe sepsis as well as osteomyelitis in this patient with underlying diabetes.  No e/o additional infectious process at this time, including chest x-ray, which showed no evidence of acute cardiopulmonary process.  Of note, urinalysis is currently pending.  EDP has contacted on-call orthopedic surgery, Dr. Susa Simmonds, requesting consultation.     Plan: CBC w/ diff and CMP in AM.  Follow for results of blood cx's x 2. Abx: Continue IV vancomycin, cefepime, Flagyl, as above.  Lactated Ringer's at 75 cc/h x 12 hours.  Monitor strict I's and O's and daily weights.  Add on ESR, CRP.  Orthopedic surgery consultation requested, as above.  N.p.o. after midnight, pending recommendations from orthopedic surgery.  Type and screen ordered.  Prn IV fentanyl.  Follow-up result  urinalysis.  Wound care consult placed.  As needed acetaminophen for fever.  Monitor on symmetry.               #) Acute Kidney Injury:  as quantified above. Suspect that this is prerenal in nature d/t potential multifactorial contributions including dehydration in setting of recently diminished PO intake,  as well as suspected additional contribution to intravascular depletion from severe sepsis itself d/t associated increase in capillary permeability, superimposed on this patient who is predisposed to dehydration as a consequence of auto diuresis resulting from his poorly controlled type 2 diabetes mellitus.  Urinalysis with microscopy ordered, with result currently pending.  Potential pharmacologic contributing factors include outpatient use of gabapentin as well as olmesartan.  Plan: monitor strict I's & O's and daily weights. Attempt to avoid nephrotoxic agents.  Hold home gabapentin, olmesartan.  Refrain from NSAIDs. Repeat CMP in the morning. Check serum magnesium level.  Follow for result of urinalysis with microscopy.  Add-on random urine sodium and random urine creatinine.  Add on CPK level.  Continuous lactated Ringer's, as above.  Further evaluation management of presenting severe sepsis due to osteomyelitis.               #) Acute hypo-osmolar hypovolemic hyponatremia: Corrected presenting serum sodium level 124 compared to 134 on 09/04/2022 and 138 on 08/16/2022. Suspect an element of hypovolumeia, with suspected contribution from dehydration in the setting of clinical evidence of such as well as report of recent decline in oral intake coinciding with the timeframe of decline of serum sodium levels, with potential contribution from ostensible losses stemming from diabetic foot wounds about the lateral aspect of the left foot.   in general, will provide gentle IV fluids to attend to suspected contribution from dehydration, while further evaluating for any additional  contributing factors, including SIADH, as further detailed below. Will also check TSH. No overt pharmacologic contributions. Of note, no evidence of associated acute focal neurologic deficits and no report of recent trauma.  Of note, presenting chest x-ray shows no evidence of acute cardiopulmonary process.   Plan: monitor strict I's and O's and daily weights.  check UA, random urine sodium, urine osmolality.  Check serum osmolality to confirm suspected hypoosmolar etiology.  Repeat BMP in the morning. Check TSH. Gentle IVF's in the form of lactated Ringer's at 75 cc/h x 12 hours.               #) Anion gap metabolic acidosis: As evidenced on presenting CMP.  Suspect that this is multifactorial, with contributions from lactic acidosis in the setting of severe sepsis from left foot osteomyelitis, as above, with suspected additional contribution from acute kidney injury.  Patient has noted to have a history of type 2 diabetes mellitus, and is mildly hyperglycemic at presentation.  However, presentation appears less likely to be associated with DKA relative to the above possibilities.  However, will check beta hydroxybutyric acid as well as blood gas to further assess, while providing interval IV fluids and glycemic management as outlined below.  Plan: Check beta hydroxybutyric acid, VBG.  Check CPK level.  CMP, CBC in the morning.  Further evaluation management of acute kidney injury and severe sepsis, as above.  Lactated Ringer's, as above.                   #) Dehydration: Clinical suspicion for such, including the appearance of dry oral mucous membranes as well as laboratory findings notable for acute prerenal azotemia and improvement in  mild tachycardia with IV fluids.  Appears to be in the setting of   diminished oral intake, likely element of auto diuresis in the setting of poorly controlled type 2 diabetes mellitus, and potential additional contribution from ostensible  losses in the setting of left diabetic foot wound, as above.  No e/o associated hypotension.   Plan: Monitor strict I's and O's.  Daily weights.  CMP in the morning. IVF's, as above.  Follow-up result urinalysis.                  #) Type 2 Diabetes Mellitus: documented history of such. Home insulin regimen: 70/30 insulin, 3 units SQ 3 times daily he is also on Mounjaro as an outpatient.  This is in the context of history of poorly controlled type 2 diabetes mellitus, with most recent hemoglobin A1c 14.3% on 05/29/2022, which is notable in the context of his progressive left diabetic foot wound, now appearing to be complicated by left foot osteomyelitis, as above.  Presenting blood sugar found to be 350.  Suspect anion gap metabolic acidosis is more likely to be as a consequence of lactic acidosis from severe sepsis as well as contribution from acute kidney injury, as opposed to representing underlying DKA.  Will also check VBG and beta hydroxybutyric acid to further assess.  Plan: In the setting of plan for n.p.o. status starting at midnight, will pursue Accu-Cheks on every 6 hours basis with associated low-dose sliding scale insulin.  Add on hemoglobin A1c level.  Hold him scheduled 70/30 insulin as well as Mounjaro for now.  Check VBG, beta hydroxybutyric acid.  Monitor strict I's and O's and daily weights.  CMP in the morning.               #) Essential Hypertension: documented h/o such, with outpatient antihypertensive regimen including olmesartan, which will be held for now in the setting of acute kidney injury as well as severe sepsis.  SBP's in the ED today: 1 teens to 150s mmHg.   Plan: Close monitoring of subsequent BP via routine VS. hold home olmesartan for now, as above.         DVT prophylaxis: SCD's   Code Status: Full code Family Communication: none Disposition Plan: Per Rounding Team Consults called: EDP is contacted on-call orthopedic surgery,  Dr.Adair, As further detailed above;  Admission status: Inpatient     I SPENT GREATER THAN 75  MINUTES IN CLINICAL CARE TIME/MEDICAL DECISION-MAKING IN COMPLETING THIS ADMISSION.      Chaney Born Hellena Pridgen DO [facility]  From 7PM - 7AM   09/13/2022, 9:04 PM

## 2022-09-13 NOTE — Sepsis Progress Note (Addendum)
Elink monitoring for the code sepsis protocol.  Secure chat sent to MD requesting order for repeat lactic.

## 2022-09-13 NOTE — Progress Notes (Signed)
Pharmacy Antibiotic Note  Grant Yang is a 67 y.o. male for which pharmacy has been consulted for vancomycin dosing for cellulitis.  Patient with a history of prostate cancer, DM, GERD, HTN, SOB, stroke. Patient presenting with  concern of left foot wound that has been present for 5-6 weeks .  SCr 1.54 - up from 7/15 (1.13) WBC 28.1; LA 1.8; T 100.4; HR 92; RR 29  Plan: Metronidazole per MD Cefepime 2g q12hr  Vancomycin 1750 mg once then 1500 mg q24hr (eAUC 502.1) unless change in renal function Monitor WBC, fever, renal function, cultures De-escalate when able Levels at steady state  Height: 5\' 11"  (180.3 cm) Weight: 88.9 kg (196 lb) IBW/kg (Calculated) : 75.3  Temp (24hrs), Avg:99.6 F (37.6 C), Min:98.7 F (37.1 C), Max:100.4 F (38 C)  Recent Labs  Lab 09/13/22 1733 09/13/22 1855 09/13/22 1932  WBC 28.1*  --   --   CREATININE 1.54*  --   --   LATICACIDVEN  --  2.8* 1.8    Estimated Creatinine Clearance: 50.9 mL/min (A) (by C-G formula based on SCr of 1.54 mg/dL (H)).    Allergies  Allergen Reactions   Penicillins Other (See Comments)    Unknown reaction during childhood   Microbiology results: Pending  Thank you for allowing pharmacy to be a part of this patient's care.  Delmar Landau, PharmD, BCPS 09/13/2022 8:16 PM ED Clinical Pharmacist -  [phone]

## 2022-09-14 ENCOUNTER — Ambulatory Visit (HOSPITAL_COMMUNITY): Payer: Medicare Other | Attending: Vascular Surgery

## 2022-09-14 ENCOUNTER — Ambulatory Visit (HOSPITAL_COMMUNITY): Payer: Medicare Other

## 2022-09-14 DIAGNOSIS — E871 Hypo-osmolality and hyponatremia: Secondary | ICD-10-CM | POA: Diagnosis not present

## 2022-09-14 DIAGNOSIS — I10 Essential (primary) hypertension: Secondary | ICD-10-CM

## 2022-09-14 DIAGNOSIS — E860 Dehydration: Secondary | ICD-10-CM

## 2022-09-14 DIAGNOSIS — M869 Osteomyelitis, unspecified: Secondary | ICD-10-CM

## 2022-09-14 DIAGNOSIS — N179 Acute kidney failure, unspecified: Secondary | ICD-10-CM | POA: Diagnosis not present

## 2022-09-14 DIAGNOSIS — Z794 Long term (current) use of insulin: Secondary | ICD-10-CM

## 2022-09-14 DIAGNOSIS — E43 Unspecified severe protein-calorie malnutrition: Secondary | ICD-10-CM

## 2022-09-14 DIAGNOSIS — B964 Proteus (mirabilis) (morganii) as the cause of diseases classified elsewhere: Secondary | ICD-10-CM

## 2022-09-14 DIAGNOSIS — E8729 Other acidosis: Secondary | ICD-10-CM

## 2022-09-14 DIAGNOSIS — R6520 Severe sepsis without septic shock: Secondary | ICD-10-CM

## 2022-09-14 DIAGNOSIS — I739 Peripheral vascular disease, unspecified: Secondary | ICD-10-CM | POA: Diagnosis not present

## 2022-09-14 DIAGNOSIS — E1142 Type 2 diabetes mellitus with diabetic polyneuropathy: Secondary | ICD-10-CM

## 2022-09-14 DIAGNOSIS — A419 Sepsis, unspecified organism: Secondary | ICD-10-CM | POA: Diagnosis not present

## 2022-09-14 DIAGNOSIS — R7881 Bacteremia: Secondary | ICD-10-CM

## 2022-09-14 LAB — BLOOD CULTURE ID PANEL (REFLEXED) - BCID2
A.calcoaceticus-baumannii: NOT DETECTED
Bacteroides fragilis: NOT DETECTED
CTX-M ESBL: NOT DETECTED
Candida albicans: NOT DETECTED
Candida auris: NOT DETECTED
Candida glabrata: NOT DETECTED
Candida krusei: NOT DETECTED
Candida parapsilosis: NOT DETECTED
Candida tropicalis: NOT DETECTED
Carbapenem resistance IMP: NOT DETECTED
Carbapenem resistance KPC: NOT DETECTED
Carbapenem resistance VIM: NOT DETECTED
Cryptococcus neoformans/gattii: NOT DETECTED
Enterobacter cloacae complex: NOT DETECTED
Enterobacterales: DETECTED — AB
Enterococcus Faecium: NOT DETECTED
Enterococcus faecalis: NOT DETECTED
Haemophilus influenzae: NOT DETECTED
Klebsiella oxytoca: NOT DETECTED
Klebsiella pneumoniae: NOT DETECTED
Listeria monocytogenes: NOT DETECTED
Neisseria meningitidis: NOT DETECTED
Pseudomonas aeruginosa: NOT DETECTED
Serratia marcescens: NOT DETECTED
Staphylococcus aureus (BCID): NOT DETECTED
Staphylococcus epidermidis: NOT DETECTED
Staphylococcus lugdunensis: NOT DETECTED
Staphylococcus species: NOT DETECTED
Stenotrophomonas maltophilia: NOT DETECTED
Streptococcus pneumoniae: NOT DETECTED
Streptococcus pyogenes: NOT DETECTED
Streptococcus species: NOT DETECTED

## 2022-09-14 LAB — CULTURE, BLOOD (ROUTINE X 2)
Culture: NO GROWTH
Special Requests: ADEQUATE

## 2022-09-14 LAB — CBC WITH DIFFERENTIAL/PLATELET
Abs Immature Granulocytes: 0.76 10*3/uL — ABNORMAL HIGH (ref 0.00–0.07)
Basophils Relative: 0 %
Eosinophils Absolute: 0 10*3/uL (ref 0.0–0.5)
Eosinophils Relative: 0 %
HCT: 30.3 % — ABNORMAL LOW (ref 39.0–52.0)
Hemoglobin: 10.1 g/dL — ABNORMAL LOW (ref 13.0–17.0)
Immature Granulocytes: 3 %
Lymphocytes Relative: 5 %
Lymphs Abs: 1.4 10*3/uL (ref 0.7–4.0)
MCH: 25.6 pg — ABNORMAL LOW (ref 26.0–34.0)
MCHC: 33.3 g/dL (ref 30.0–36.0)
MCV: 76.7 fL — ABNORMAL LOW (ref 80.0–100.0)
Monocytes Absolute: 1.6 10*3/uL — ABNORMAL HIGH (ref 0.1–1.0)
Monocytes Relative: 6 %
Neutro Abs: 23.8 10*3/uL — ABNORMAL HIGH (ref 1.7–7.7)
Neutrophils Relative %: 86 %
Platelets: 467 10*3/uL — ABNORMAL HIGH (ref 150–400)
RBC: 3.95 MIL/uL — ABNORMAL LOW (ref 4.22–5.81)
RDW: 13.7 % (ref 11.5–15.5)
nRBC: 0 % (ref 0.0–0.2)

## 2022-09-14 LAB — COMPREHENSIVE METABOLIC PANEL
AST: 25 U/L (ref 15–41)
Albumin: 1.9 g/dL — ABNORMAL LOW (ref 3.5–5.0)
Alkaline Phosphatase: 128 U/L — ABNORMAL HIGH (ref 38–126)
Anion gap: 12 (ref 5–15)
BUN: 31 mg/dL — ABNORMAL HIGH (ref 8–23)
CO2: 24 mmol/L (ref 22–32)
Calcium: 9.1 mg/dL (ref 8.9–10.3)
Chloride: 94 mmol/L — ABNORMAL LOW (ref 98–111)
GFR, Estimated: >60 mL/min (ref >60)
Glucose, Bld: 194 mg/dL — ABNORMAL HIGH (ref 70–99)
Sodium: 130 mmol/L — ABNORMAL LOW (ref 135–145)
Total Bilirubin: 1 mg/dL (ref 0.3–1.2)
Total Protein: 6.8 g/dL (ref 6.5–8.1)

## 2022-09-14 LAB — TSH: TSH: 0.757 u[IU]/mL (ref 0.350–4.500)

## 2022-09-14 LAB — BETA-HYDROXYBUTYRIC ACID: Beta-Hydroxybutyric Acid: 1.17 mmol/L — ABNORMAL HIGH (ref 0.05–0.27)

## 2022-09-14 LAB — MAGNESIUM
Magnesium: 2.4 mg/dL (ref 1.7–2.4)
Magnesium: 2.4 mg/dL (ref 1.7–2.4)

## 2022-09-14 LAB — TYPE AND SCREEN
ABO/RH(D): A POS
Antibody Screen: NEGATIVE

## 2022-09-14 LAB — GLUCOSE, CAPILLARY
Glucose-Capillary: 167 mg/dL — ABNORMAL HIGH (ref 70–99)
Glucose-Capillary: 271 mg/dL — ABNORMAL HIGH (ref 70–99)
Glucose-Capillary: 286 mg/dL — ABNORMAL HIGH (ref 70–99)

## 2022-09-14 LAB — BLOOD GAS, VENOUS
Acid-Base Excess: 2.6 mmol/L — ABNORMAL HIGH (ref 0.0–2.0)
Bicarbonate: 27.2 mmol/L (ref 20.0–28.0)
O2 Saturation: 28.5 %
Patient temperature: 36.7
pCO2, Ven: 41 mmHg — ABNORMAL LOW (ref 44–60)
pH, Ven: 7.43 (ref 7.25–7.43)
pO2, Ven: <31 mmHg — CL (ref 32–45)

## 2022-09-14 LAB — CK: Total CK: 59 U/L (ref 49–397)

## 2022-09-14 LAB — HEMOGLOBIN A1C
Hgb A1c MFr Bld: 9.6 % — ABNORMAL HIGH (ref 4.8–5.6)
Mean Plasma Glucose: 228.82 mg/dL

## 2022-09-14 LAB — SEDIMENTATION RATE: Sed Rate: 143 mm/hr — ABNORMAL HIGH (ref 0–16)

## 2022-09-14 LAB — C-REACTIVE PROTEIN: CRP: 25.9 mg/dL — ABNORMAL HIGH (ref <1.0)

## 2022-09-14 MED ORDER — VANCOMYCIN HCL 1250 MG/250ML IV SOLN
1250.0000 mg | INTRAVENOUS | Status: DC
  Administered 2022-09-14 – 2022-09-18 (×5): 1250 mg via INTRAVENOUS
  Filled 2022-09-14 (×5): qty 250

## 2022-09-14 MED ORDER — POTASSIUM CHLORIDE CRYS ER 20 MEQ PO TBCR
40.0000 meq | EXTENDED_RELEASE_TABLET | Freq: Once | ORAL | Status: AC
  Administered 2022-09-14: 40 meq via ORAL
  Filled 2022-09-14: qty 2

## 2022-09-14 MED ORDER — LACTATED RINGERS IV SOLN: INTRAVENOUS | Status: DC

## 2022-09-14 MED ORDER — SODIUM CHLORIDE 0.9 % IV SOLN
2.0000 g | Freq: Three times a day (TID) | INTRAVENOUS | Status: DC
  Administered 2022-09-14 – 2022-09-20 (×17): 2 g via INTRAVENOUS
  Filled 2022-09-14 (×17): qty 12.5

## 2022-09-14 NOTE — Progress Notes (Addendum)
Pharmacy Antibiotic Note  Grant Yang is a 67 y.o. male for which pharmacy has been consulted for vancomycin dosing for cellulitis.  Patient with a history of prostate cancer, DM, GERD, HTN, SOB, stroke. Patient presenting with  concern of left foot wound that has been present for 5-6 weeks .  Scr decrease to <1 this AM. Plan for amputation on Sunday. Blood culture is showing GNR. Even after amputation is complete, pt will need to continue abx based on culture since he is bacteremic.  Plan: Change vanc 1250mg  IV q24>>AUC 497, scr 0.88 Change cefepime 2g IV q8 Kcl x1  Height: 5\' 11"  (180.3 cm) Weight: 88.9 kg (196 lb) IBW/kg (Calculated) : 75.3  Temp (24hrs), Avg:98.7 F (37.1 C), Min:97.9 F (36.6 C), Max:100.4 F (38 C)  Recent Labs  Lab 09/13/22 1733 09/13/22 1855 09/13/22 1932 09/14/22 0727  WBC 28.1*  --   --  27.8*  CREATININE 1.54*  --   --  0.88  LATICACIDVEN  --  2.8* 1.8  --     Estimated Creatinine Clearance: 89.1 mL/min (by C-G formula based on SCr of 0.88 mg/dL).    Allergies  Allergen Reactions   Penicillins Other (See Comments)    Unknown reaction during childhood   7/24 vanc>> 7/24 cefepime>> 7/24 flagyl>>   7/24 blood>>GNRs 1/4  Ulyses Southward, PharmD, [place], [place], CPP Infectious Disease Pharmacist 09/14/2022 10:53 AM

## 2022-09-14 NOTE — Consult Note (Signed)
ORTHOPAEDIC CONSULTATION  REQUESTING PHYSICIAN: Merlene Laughter, DO  Chief Complaint: Necrotic ulcer lateral left foot.  HPI: Grant Yang is a 67 y.o. male who presents with necrotic ulcer lateral left foot.  Patient states he had no problems healing the second ray amputation.  With shoe wear patient developed this ulcer which he states has worsened and has a foul smell.  Patient is status post endovascular revascularization of the left lower extremity with Dr. Blase Mess June 10.  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 [place] in 2010 . denies any residual deficits    Past Surgical History:  Procedure Laterality Date   ABDOMINAL AORTOGRAM W/LOWER EXTREMITY N/A 07/31/2022   Procedure: ABDOMINAL AORTOGRAM W/LOWER EXTREMITY;  Surgeon: Leonie Douglas, MD;  [facility] IN[facility] LAB;  Service: Cardiovascular;  Laterality: N/A;   AMPUTATION Left 05/31/2022   Procedure: LEFT 2ND TOE AMPUTATION;  Surgeon: Nadara Mustard, MD;  Location: [facility] OR;  Service: Orthopedics;  Laterality: Left;   APPENDECTOMY     CHOLECYSTECTOMY  12/30/2010   Procedure: LAPAROSCOPIC CHOLECYSTECTOMY;  Surgeon: Dalia Heading;  Location: [facility] ORS;  Service: General;  Laterality: N/A;   I & D EXTREMITY Left 05/31/2022   Procedure: LEFT FOOT DEBRIDEMENT;  Surgeon: Nadara Mustard, MD;  Location: [facility] OR;  Service: Orthopedics;  Laterality: Left;   INCISION AND DRAINAGE ABSCESS Left 02/01/2017   Procedure: INCISION AND DRAINAGE ABSCESS LEFT HAND;  Surgeon: Vickki Hearing, MD;  Location: [facility] ORS;  Service: Orthopedics;   Laterality: Left;   LOWER EXTREMITY ANGIOGRAPHY N/A 08/03/2022   Procedure: Lower Extremity Angiography;  Surgeon: Cephus Shelling, MD;  Location: [facility] INVASIVE CV LAB;  Service: Cardiovascular;  Laterality: N/A;   LYMPHADENECTOMY Bilateral 08/02/2017   Procedure: LYMPHADENECTOMY, PELVIC;  Surgeon: Heloise Purpura, MD;  Location: [facility] ORS;  Service: Urology;  Laterality: Bilateral;   PERIPHERAL VASCULAR BALLOON ANGIOPLASTY  08/03/2022   Procedure: PERIPHERAL VASCULAR BALLOON ANGIOPLASTY;  Surgeon: Cephus Shelling, MD;  [facility];  Service: Cardiovascular;;   ROBOT ASSISTED LAPAROSCOPIC RADICAL PROSTATECTOMY N/A 08/02/2017   Procedure: XI ROBOTIC ASSISTED LAPAROSCOPIC RADICAL PROSTATECTOMY LEVEL 2;  Surgeon: Heloise Purpura, MD;  Location: [facility]S;  Service: Urology;  Laterality: N/A;   Social History   Socioeconomic History   Marital status: Divorced    Spouse name: Not on file   Number of children: Not on file   Years of education: Not on file   Highest education level: Not on file  Occupational History   Not on file  Tobacco Use   Smoking status: Former    Current packs/day: 0.00    Average packs/day: 2.0 packs/day for 38.0 years (76.0 ttl pk-yrs)    Types: Cigarettes    Start date: 39    Quit date: 2010    Years since quitting: 14.5   Smokeless tobacco: Never  Substance and Sexual Activity   Alcohol use: No   Drug use: Yes    Types: Marijuana  Comment: occ   Sexual activity: Yes  Other Topics Concern   Not on file  Social History Narrative   Not on file   Social Determinants of Health   Financial Resource Strain: Not on file  Food Insecurity: No Food Insecurity (07/30/2022)   Hunger Vital Sign    Worried About Running Out of Food in the Last Year: Never true    Ran Out of Food in the Last Year: Never true  Transportation Needs: No Transportation Needs (07/30/2022)   PRAPARE - Administrator, Civil Service (Medical): No    Lack of  Transportation (Non-Medical): No  Physical Activity: Not on file  Stress: Not on file  Social Connections: Not on file   Family History  Problem Relation Age of Onset   Heart failure Mother    Cancer Father    - negative except otherwise stated in the family history section Allergies  Allergen Reactions   Penicillins Other (See Comments)    Unknown reaction during childhood   Prior to Admission medications   Medication Sig Start Date End Date Taking? Authorizing Provider  aspirin EC 81 MG tablet Take 1 tablet (81 mg total) by mouth daily. Swallow whole. 06/29/22  Yes Billie Lade, MD  blood glucose meter kit and supplies Dispense based on patient and insurance preference. Use up to four times daily as directed. (FOR ICD-10 E10.9, E11.9). 02/03/17  Yes Langston Reusing, MD  clopidogrel (PLAVIX) 75 MG tablet Take 1 tablet (75 mg total) by mouth daily with breakfast. 08/05/22  Yes Adhikari, Willia Craze, MD  Continuous Blood Gluc Sensor (DEXCOM G7 SENSOR) MISC 1 Units by Does not apply route See admin instructions. Change sensor after 10 days 06/05/22  Yes Gardenia Phlegm, MD  EPINEPHrine 0.3 mg/0.3 mL IJ SOAJ injection Inject 0.3 mg into the muscle once as needed for up to 1 dose (if patient exhibits significant signs and symptoms of allergic reaction.). 06/02/22  Yes Lanae Boast, MD  gabapentin (NEURONTIN) 300 MG capsule Take 1 capsule (300 mg total) by mouth 3 (three) times daily as needed. Patient taking differently: Take 300 mg by mouth 3 (three) times daily as needed (Neuropathy). 05/29/22  Yes Gardenia Phlegm, MD  ibuprofen (ADVIL) 200 MG tablet Take 400 mg by mouth 2 (two) times daily as needed for headache or moderate pain.   Yes [provider]  insulin NPH-regular Human (NOVOLIN 70/30) (70-30) 100 UNIT/ML injection Inject 10 Units into the skin 2 (two) times daily with a meal. Patient taking differently: Inject 3 Units into the skin 3 (three) times daily with meals. 02/03/17  Yes  Langston Reusing, MD  Insulin Syringes, Disposable, U-100 0.3 ML MISC 10 Units by Does not apply route 2 (two) times daily with breakfast and lunch. 02/03/17  Yes Langston Reusing, MD  olmesartan (BENICAR) 20 MG tablet Take 1 tablet (20 mg total) by mouth daily. 06/29/22 09/27/22 Yes Billie Lade, MD  tirzepatide [facility]) 2.5 MG/0.5ML Pen Inject 2.5 mg into the skin once a week. 08/08/22  Yes Billie Lade, MD  albuterol (VENTOLIN HFA) 108 (90 Base) MCG/ACT inhaler Inhale 2 puffs into the lungs every 6 (six) hours as needed for wheezing or shortness of breath. Patient not taking: Reported on 09/13/2022 05/29/22   Gardenia Phlegm, MD  atorvastatin (LIPITOR) 40 MG tablet Take 1 tablet (40 mg total) by mouth daily. Patient not taking: Reported on 09/13/2022 06/29/22   Billie Lade, MD  metFORMIN (GLUCOPHAGE-XR) 500 MG 24 hr tablet Take 500 mg by mouth once daily x1 week, then increase to 500 mg twice daily x1 week, then 1,000 mg in the AM and 500 mg in the PM x1 week, and finally 1,000 mg twice daily. Take with food. Patient not taking: Reported on 09/13/2022 05/29/22   Gardenia Phlegm, MD   DG Chest Port 1 View  Result Date: 09/13/2022 CLINICAL DATA:  Questionable sepsis - evaluate for abnormality EXAM: PORTABLE CHEST 1 VIEW COMPARISON:  12/28/2010 FINDINGS: The heart size and mediastinal contours are within normal limits. Both lungs are clear. The visualized skeletal structures are unremarkable. IMPRESSION: No active disease. Electronically Signed   By: Charlett Nose M.D.   On: 09/13/2022 19:18   - pertinent xrays, CT, MRI studies were reviewed and independently interpreted  Positive ROS: All other systems have been reviewed and were otherwise negative with the exception of those mentioned in the HPI and as above.  Physical Exam: General: Alert, no acute distress Psychiatric: Patient is competent for consent with normal mood and affect Lymphatic: No axillary or cervical  lymphadenopathy Cardiovascular: No pedal edema Respiratory: No cyanosis, no use of accessory musculature GI: No organomegaly, abdomen is soft and non-tender    Images:  @ENCIMAGES @  Labs:  Lab Results  Component Value Date   HGBA1C 14.3 (H) 05/29/2022   HGBA1C 10.4 (H) 01/30/2017   HGBA1C 12.3 (H) 12/28/2010   ESRSEDRATE 86 (H) 07/30/2022   ESRSEDRATE 50 (H) 06/01/2022   ESRSEDRATE 17 (H) 01/30/2017   CRP 12.4 (H) 07/30/2022   CRP 0.6 06/01/2022   CRP 6.6 (H) 01/30/2017   REPTSTATUS 09/08/2022 FINAL 09/04/2022   GRAMSTAIN  09/04/2022    RARE WBC SEEN ABUNDANT GRAM NEGATIVE RODS MODERATE GRAM POSITIVE COCCI    CULT  09/04/2022    ABUNDANT MORGANELLA MORGANII FEW PROTEUS MIRABILIS MODERATE STAPHYLOCOCCUS HAEMOLYTICUS MODERATE BACTEROIDES THETAIOTAOMICRON BETA LACTAMASE POSITIVE Performed at [facility], [address] [place], [place] [address]    LABORGA [facility] MORGANII 09/04/2022   LABORGA PROTEUS MIRABILIS 09/04/2022   LABORGA STAPHYLOCOCCUS HAEMOLYTICUS 09/04/2022    Lab Results  Component Value Date   ALBUMIN 2.2 (L) 09/13/2022   ALBUMIN 3.2 (L) 09/04/2022   ALBUMIN 3.8 (L) 08/16/2022        Latest Ref Rng & Units 09/13/2022    5:33 PM 09/04/2022   12:31 PM 08/16/2022    2:18 PM  CBC EXTENDED  WBC 4.0 - 10.5 K/uL 28.1  19.8  13.9   RBC 4.22 - 5.81 MIL/uL 4.17  4.22  4.48   Hemoglobin 13.0 - 17.0 g/dL 60.4  54.0  98.1   HCT 39.0 - 52.0 % 33.6  35.5  37.1   Platelets 150 - 400 K/uL 491  365  433   NEUT# 1.7 - 7.7 K/uL 24.7  16.1  9.2   Lymph# 0.7 - 4.0 K/uL 0.8  1.5  2.7     Neurologic: Patient does not have protective sensation bilateral lower extremities.   MUSCULOSKELETAL:   Skin: Examination patient has cellulitis extending up to mid calf.  There is a large necrotic wound over the lateral left foot that probes to bone.  I cannot palpate a dorsalis pedis pulse but this may be secondary to the swelling.  Review of the MRI scan shows  osteomyelitis lateral column left foot including the cuboid.  White cell count 28.1 with absolute neutrophil count 24.7 and lymphocyte count 0.8.  Albumin 2.2.  Hemoglobin A1c  14.3.  Hemoglobin 11.2.  Assessment: Assessment: Sepsis with osteomyelitis and necrotic ulcer left foot with uncontrolled type 2 diabetes with severe protein caloric malnutrition and peripheral vascular disease status post revascularization in June.  Plan: Plan: Recommended proceeding with a transtibial amputation.  Risk and benefits were discussed including risk of the wound not healing need for additional surgery.  Patient states he understands wished to proceed at this time.  Plan to continue IV antibiotics will post for surgery on Sunday morning.  Thank you for the consult and the opportunity to see Mr. Fransisco Messmer, MD [facility] [phone] 7:47 AM

## 2022-09-14 NOTE — H&P (View-Only) (Signed)
ORTHOPAEDIC CONSULTATION  REQUESTING PHYSICIAN: Merlene Laughter, DO  Chief Complaint: Necrotic ulcer lateral left foot.  HPI: Grant Yang is a 67 y.o. male who presents with necrotic ulcer lateral left foot.  Patient states he had no problems healing the second ray amputation.  With shoe wear patient developed this ulcer which he states has worsened and has a foul smell.  Patient is status post endovascular revascularization of the left lower extremity with Dr. Blase Mess June 10.  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 in 2010 . denies any residual deficits    Past Surgical History:  Procedure Laterality Date   ABDOMINAL AORTOGRAM W/LOWER EXTREMITY N/A 07/31/2022   Procedure: ABDOMINAL AORTOGRAM W/LOWER EXTREMITY;  Surgeon: Leonie Douglas, MD;  [facility] INVASIVE CV LAB;  Service: Cardiovascular;  Laterality: N/A;   AMPUTATION Left 05/31/2022   Procedure: LEFT 2ND TOE AMPUTATION;  Surgeon: Nadara Mustard, MD;  Location: [facility] OR;  Service: Orthopedics;  Laterality: Left;   APPENDECTOMY     CHOLECYSTECTOMY  12/30/2010   Procedure: LAPAROSCOPIC CHOLECYSTECTOMY;  Surgeon: Dalia Heading;  Location: [facility] ORS;  Service: General;  Laterality: N/A;   I & D EXTREMITY Left 05/31/2022   Procedure: LEFT FOOT DEBRIDEMENT;  Surgeon: Nadara Mustard, MD;  Location: [facility] OR;  Service: Orthopedics;  Laterality: Left;   INCISION AND DRAINAGE ABSCESS Left 02/01/2017   Procedure: INCISION AND DRAINAGE ABSCESS LEFT HAND;  Surgeon: Vickki Hearing, MD;  Location: [facility] ORS;  Service: Orthopedics;   Laterality: Left;   LOWER EXTREMITY ANGIOGRAPHY N/A 08/03/2022   Procedure: Lower Extremity Angiography;  Surgeon: Cephus Shelling, MD;  Location: [facility] INVASIVE CV LAB;  Service: Cardiovascular;  Laterality: N/A;   LYMPHADENECTOMY Bilateral 08/02/2017   Procedure: LYMPHADENECTOMY, PELVIC;  Surgeon: Heloise Purpura, MD;  Location: [facility] ORS;  Service: Urology;  Laterality: Bilateral;   PERIPHERAL VASCULAR BALLOON ANGIOPLASTY  08/03/2022   Procedure: PERIPHERAL VASCULAR BALLOON ANGIOPLASTY;  Surgeon: Cephus Shelling, MD;  [facility] LAB;  Service: Cardiovascular;;   ROBOT ASSISTED LAPAROSCOPIC RADICAL PROSTATECTOMY N/A 08/02/2017   Procedure: XI ROBOTIC ASSISTED LAPAROSCOPIC RADICAL PROSTATECTOMY LEVEL 2;  Surgeon: Heloise Purpura, MD;  Location: [facility] ORS;  Service: Urology;  Laterality: N/A;   Social History   Socioeconomic History   Marital status: Divorced    Spouse name: Not on file   Number of children: Not on file   Years of education: Not on file   Highest education level: Not on file  Occupational History   Not on file  Tobacco Use   Smoking status: Former    Current packs/day: 0.00    Average packs/day: 2.0 packs/day for 38.0 years (76.0 ttl pk-yrs)    Types: Cigarettes    Start date: 81    Quit date: 2010    Years since quitting: 14.5   Smokeless tobacco: Never  Substance and Sexual Activity   Alcohol use: No   Drug use: Yes    Types: Marijuana  Comment: occ   Sexual activity: Yes  Other Topics Concern   Not on file  Social History Narrative   Not on file   Social Determinants of Health   Financial Resource Strain: Not on file  Food Insecurity: No Food Insecurity (07/30/2022)   Hunger Vital Sign    Worried About Running Out of Food in the Last Year: Never true    Ran Out of Food in the Last Year: Never true  Transportation Needs: No Transportation Needs (07/30/2022)   PRAPARE - Administrator, Civil Service (Medical): No    Lack of  Transportation (Non-Medical): No  Physical Activity: Not on file  Stress: Not on file  Social Connections: Not on file   Family History  Problem Relation Age of Onset   Heart failure Mother    Cancer Father    - negative except otherwise stated in the family history section Allergies  Allergen Reactions   Penicillins Other (See Comments)    Unknown reaction during childhood   Prior to Admission medications   Medication Sig Start Date End Date Taking? Authorizing Provider  aspirin EC 81 MG tablet Take 1 tablet (81 mg total) by mouth daily. Swallow whole. 06/29/22  Yes Billie Lade, MD  blood glucose meter kit and supplies Dispense based on patient and insurance preference. Use up to four times daily as directed. (FOR ICD-10 E10.9, E11.9). 02/03/17  Yes Langston Reusing, MD  clopidogrel (PLAVIX) 75 MG tablet Take 1 tablet (75 mg total) by mouth daily with breakfast. 08/05/22  Yes Adhikari, Willia Craze, MD  Continuous Blood Gluc Sensor (DEXCOM G7 SENSOR) MISC 1 Units by Does not apply route See admin instructions. Change sensor after 10 days 06/05/22  Yes Gardenia Phlegm, MD  EPINEPHrine 0.3 mg/0.3 mL IJ SOAJ injection Inject 0.3 mg into the muscle once as needed for up to 1 dose (if patient exhibits significant signs and symptoms of allergic reaction.). 06/02/22  Yes Lanae Boast, MD  gabapentin (NEURONTIN) 300 MG capsule Take 1 capsule (300 mg total) by mouth 3 (three) times daily as needed. Patient taking differently: Take 300 mg by mouth 3 (three) times daily as needed (Neuropathy). 05/29/22  Yes Gardenia Phlegm, MD  ibuprofen (ADVIL) 200 MG tablet Take 400 mg by mouth 2 (two) times daily as needed for headache or moderate pain.   Yes [provider]  insulin NPH-regular Human (NOVOLIN 70/30) (70-30) 100 UNIT/ML injection Inject 10 Units into the skin 2 (two) times daily with a meal. Patient taking differently: Inject 3 Units into the skin 3 (three) times daily with meals. 02/03/17  Yes  Langston Reusing, MD  Insulin Syringes, Disposable, U-100 0.3 ML MISC 10 Units by Does not apply route 2 (two) times daily with breakfast and lunch. 02/03/17  Yes Langston Reusing, MD  olmesartan (BENICAR) 20 MG tablet Take 1 tablet (20 mg total) by mouth daily. 06/29/22 09/27/22 Yes Billie Lade, MD  tirzepatide [facility]) 2.5 MG/0.5ML Pen Inject 2.5 mg into the skin once a week. 08/08/22  Yes Billie Lade, MD  albuterol (VENTOLIN HFA) 108 (90 Base) MCG/ACT inhaler Inhale 2 puffs into the lungs every 6 (six) hours as needed for wheezing or shortness of breath. Patient not taking: Reported on 09/13/2022 05/29/22   Gardenia Phlegm, MD  atorvastatin (LIPITOR) 40 MG tablet Take 1 tablet (40 mg total) by mouth daily. Patient not taking: Reported on 09/13/2022 06/29/22   Billie Lade, MD  metFORMIN (GLUCOPHAGE-XR) 500 MG 24 hr tablet Take 500 mg by mouth once daily x1 week, then increase to 500 mg twice daily x1 week, then 1,000 mg in the AM and 500 mg in the PM x1 week, and finally 1,000 mg twice daily. Take with food. Patient not taking: Reported on 09/13/2022 05/29/22   Gardenia Phlegm, MD   DG Chest Port 1 View  Result Date: 09/13/2022 CLINICAL DATA:  Questionable sepsis - evaluate for abnormality EXAM: PORTABLE CHEST 1 VIEW COMPARISON:  12/28/2010 FINDINGS: The heart size and mediastinal contours are within normal limits. Both lungs are clear. The visualized skeletal structures are unremarkable. IMPRESSION: No active disease. Electronically Signed   By: Charlett Nose M.D.   On: 09/13/2022 19:18   - pertinent xrays, CT, MRI studies were reviewed and independently interpreted  Positive ROS: All other systems have been reviewed and were otherwise negative with the exception of those mentioned in the HPI and as above.  Physical Exam: General: Alert, no acute distress Psychiatric: Patient is competent for consent with normal mood and affect Lymphatic: No axillary or cervical  lymphadenopathy Cardiovascular: No pedal edema Respiratory: No cyanosis, no use of accessory musculature GI: No organomegaly, abdomen is soft and non-tender    Images:  @ENCIMAGES @  Labs:  Lab Results  Component Value Date   HGBA1C 14.3 (H) 05/29/2022   HGBA1C 10.4 (H) 01/30/2017   HGBA1C 12.3 (H) 12/28/2010   ESRSEDRATE 86 (H) 07/30/2022   ESRSEDRATE 50 (H) 06/01/2022   ESRSEDRATE 17 (H) 01/30/2017   CRP 12.4 (H) 07/30/2022   CRP 0.6 06/01/2022   CRP 6.6 (H) 01/30/2017   REPTSTATUS 09/08/2022 FINAL 09/04/2022   GRAMSTAIN  09/04/2022    RARE WBC SEEN ABUNDANT GRAM NEGATIVE RODS MODERATE GRAM POSITIVE COCCI    CULT  09/04/2022    ABUNDANT MORGANELLA MORGANII FEW PROTEUS MIRABILIS MODERATE STAPHYLOCOCCUS HAEMOLYTICUS MODERATE BACTEROIDES THETAIOTAOMICRON BETA LACTAMASE POSITIVE Performed at [facility], [address] [place], [place] [address]    LABORGA [facility] MORGANII 09/04/2022   LABORGA PROTEUS MIRABILIS 09/04/2022   LABORGA STAPHYLOCOCCUS HAEMOLYTICUS 09/04/2022    Lab Results  Component Value Date   ALBUMIN 2.2 (L) 09/13/2022   ALBUMIN 3.2 (L) 09/04/2022   ALBUMIN 3.8 (L) 08/16/2022        Latest Ref Rng & Units 09/13/2022    5:33 PM 09/04/2022   12:31 PM 08/16/2022    2:18 PM  CBC EXTENDED  WBC 4.0 - 10.5 K/uL 28.1  19.8  13.9   RBC 4.22 - 5.81 MIL/uL 4.17  4.22  4.48   Hemoglobin 13.0 - 17.0 g/dL 60.4  54.0  98.1   HCT 39.0 - 52.0 % 33.6  35.5  37.1   Platelets 150 - 400 K/uL 491  365  433   NEUT# 1.7 - 7.7 K/uL 24.7  16.1  9.2   Lymph# 0.7 - 4.0 K/uL 0.8  1.5  2.7     Neurologic: Patient does not have protective sensation bilateral lower extremities.   MUSCULOSKELETAL:   Skin: Examination patient has cellulitis extending up to mid calf.  There is a large necrotic wound over the lateral left foot that probes to bone.  I cannot palpate a dorsalis pedis pulse but this may be secondary to the swelling.  Review of the MRI scan shows  osteomyelitis lateral column left foot including the cuboid.  White cell count 28.1 with absolute neutrophil count 24.7 and lymphocyte count 0.8.  Albumin 2.2.  Hemoglobin A1c  14.3.  Hemoglobin 11.2.  Assessment: Assessment: Sepsis with osteomyelitis and necrotic ulcer left foot with uncontrolled type 2 diabetes with severe protein caloric malnutrition and peripheral vascular disease status post revascularization in June.  Plan: Plan: Recommended proceeding with a transtibial amputation.  Risk and benefits were discussed including risk of the wound not healing need for additional surgery.  Patient states he understands wished to proceed at this time.  Plan to continue IV antibiotics will post for surgery on Sunday morning.  Thank you for the consult and the opportunity to see Mr. Zae Stones, MD [facility] [phone] 7:47 AM

## 2022-09-14 NOTE — Progress Notes (Addendum)
Md notified of critical Lab VBG O2  less then 31. No new orders at this time

## 2022-09-14 NOTE — Progress Notes (Signed)
PHARMACY - PHYSICIAN COMMUNICATION CRITICAL VALUE ALERT - BLOOD CULTURE IDENTIFICATION (BCID)  Grant Yang is an 67 y.o. male who presented to [facility] on 09/13/2022 with a chief complaint of acute osteomyelitis of the left foot.   Assessment: 67 year old male admitted with osteo of the left foot. Culture from left foot showing morganella, proteus and staph haemolyticus, and bacteroides. Blood cultures now with proteus species in 1/4 bottles.    Name of physician (or Provider) Contacted: Marland Mcalpine   Current antibiotics: Vanc/cefepime/flagyl   Changes to prescribed antibiotics recommended:  No changes needed. Await amputation on Sunday   Results for orders placed or performed during the hospital encounter of 09/13/22  Blood Culture ID Panel (Reflexed) (Collected: 09/13/2022  7:20 PM)  Result Value Ref Range   Enterococcus faecalis NOT DETECTED NOT DETECTED   Enterococcus Faecium NOT DETECTED NOT DETECTED   Listeria monocytogenes NOT DETECTED NOT DETECTED   Staphylococcus species NOT DETECTED NOT DETECTED   Staphylococcus aureus (BCID) NOT DETECTED NOT DETECTED   Staphylococcus epidermidis NOT DETECTED NOT DETECTED   Staphylococcus lugdunensis NOT DETECTED NOT DETECTED   Streptococcus species NOT DETECTED NOT DETECTED   Streptococcus agalactiae NOT DETECTED NOT DETECTED   Streptococcus pneumoniae NOT DETECTED NOT DETECTED   Streptococcus pyogenes NOT DETECTED NOT DETECTED   A.calcoaceticus-baumannii NOT DETECTED NOT DETECTED   Bacteroides fragilis NOT DETECTED NOT DETECTED   Enterobacterales DETECTED (A) NOT DETECTED   Enterobacter cloacae complex NOT DETECTED NOT DETECTED   Escherichia coli NOT DETECTED NOT DETECTED   Klebsiella aerogenes NOT DETECTED NOT DETECTED   Klebsiella oxytoca NOT DETECTED NOT DETECTED   Klebsiella pneumoniae NOT DETECTED NOT DETECTED   Proteus species DETECTED (A) NOT DETECTED   Salmonella species NOT DETECTED NOT DETECTED   Serratia marcescens NOT  DETECTED NOT DETECTED   Haemophilus influenzae NOT DETECTED NOT DETECTED   Neisseria meningitidis NOT DETECTED NOT DETECTED   Pseudomonas aeruginosa NOT DETECTED NOT DETECTED   Stenotrophomonas maltophilia NOT DETECTED NOT DETECTED   Candida albicans NOT DETECTED NOT DETECTED   Candida auris NOT DETECTED NOT DETECTED   Candida glabrata NOT DETECTED NOT DETECTED   Candida krusei NOT DETECTED NOT DETECTED   Candida parapsilosis NOT DETECTED NOT DETECTED   Candida tropicalis NOT DETECTED NOT DETECTED   Cryptococcus neoformans/gattii NOT DETECTED NOT DETECTED   CTX-M ESBL NOT DETECTED NOT DETECTED   Carbapenem resistance IMP NOT DETECTED NOT DETECTED   Carbapenem resistance KPC NOT DETECTED NOT DETECTED   Carbapenem resistance NDM NOT DETECTED NOT DETECTED   Carbapenem resist OXA 48 LIKE NOT DETECTED NOT DETECTED   Carbapenem resistance VIM NOT DETECTED NOT DETECTED    Sharin Mons, PharmD, BCPS, BCIDP Infectious Diseases Clinical Pharmacist Phone: [phone] 09/14/2022  12:04 PM

## 2022-09-14 NOTE — Plan of Care (Signed)

## 2022-09-14 NOTE — Progress Notes (Signed)
PROGRESS NOTE    Grant Yang  ONG:[id number] DOB: 1955/07/24 DOA: 09/13/2022 PCP: Billie Lade, MD   Brief Narrative:  The patient is a 67 year old Caucasian male with a past medical history significant for but not limited to uncontrolled diabetes mellitus type 2, essential hypertension who presents with left foot pain and has a diabetic foot ulcer with osteomyelitis.  He presented to [facility] ED on 09/04/2022 complaining of a worsening left foot wound and underwent an MRI which is notable for a large skin wound at the lateral aspect concerning for abscess and osteomyelitis at that time.  He was recommended for admission however he subsequently left AMA from the ED and did not seek medical care until presenting to the [facility] ED.  Patient reports further progression of left foot discomfort along with worsening of the left lateral foot wound and progressive erythema.  Orthopedic surgery was consulted and they are planning on a transtibial amputation on Sunday.  Further workup also reveals that he has a gram-negative bacteremia with Proteus in the setting of his diabetic foot infection.  He remains on broad-spectrum antibiotics for now  Assessment and Plan:  Severe Sepsis 2/2 to Acute Osteomyelitis of the Left Foot with Diabetic Foot Ulcer complicated by GNR Bacteremia -Has had progressive left lateral foot discomfort associated with progressive erythema, swelling in the context of progressive diabetic foot wounds associated left lateral aspect -MRI done on 09/04/2022 showed bone marrow edema of the cuboid and the base of the fifth metatarsal base of the proximal phalanx of the digit which is concerning for osteomyelitis -Patient has progression of the left lateral foot ulcer in the setting of poorly controlled diabetes and diabetic neuropathy -Met sepsis criteria given that he is tachycardic, tachypneic had a leukocytosis, fever and a lactic acid of 2.8 and elevated inflammatory markers -He did  not get the 30 mill liters per kilogram IV fluid bolus was given 1 L fluid bolus and started on maintenance IV fluids which at times but I have resumed with LR at 75 MLS per hour x 1 day -Blood cultures x 2 showing gram-negative bacteremia with showing Proteus species in 1 out of 4 bottles -Culture of the left foot showed Morganella, Proteus and staph hemolyticus and Bacteroides -Continuing antibiotics were started with IV vancomycin, cefepime and Flagyl for now -Follow-up on cultures -Orthopedic surgery consulted and planning for a left BKA given the severity of infection -Will continue antibiotics and even after patient completes his amputation will need antibiotics after that given that he is bacteremic -Continue with pain control  Hypoosmolar hypovolemic hyponatremia -Has suspected element of hypovolemia with suspected contribution from dehydration the sick clinical setting of sepsis and recent decline of oral intake Na+ Trend: Recent Labs  Lab 08/16/22 1418 09/04/22 1231 09/13/22 1733 09/14/22 0727  NA 138 134* 120* 130*  -C/w LR at 75 mL/hr -Continue to Monitor and Trend and repeat CMP in the AM   Hypokalemia -Patient's K+ Level Trend: Recent Labs  Lab 08/16/22 1418 09/04/22 1231 09/13/22 1733 09/14/22 0727  K 4.6 4.3 3.9 3.4*  -Replete with po Kcl 40 mEQ -Continue to Monitor and Replete as Necessary -Repeat CMP in the AM   Uncontrolled Diabetes Mellitus Type 2 -Include 70/30 insulin and is also on Mounjaro as an outpatient Last hemoglobin A1c was 14.3% -CBG and Glucose Trend: Recent Labs  Lab 08/16/22 1418 09/04/22 1231 09/13/22 1733 09/14/22 0727  GLUCOSE 121* 248* 350* 194*  - Recent Labs  Lab 09/13/22 2134  09/14/22 0548 09/14/22 1202 09/14/22 1751  GLUCAP 275* 167* 271* 286*  -Continue with sensitive NovoLog/scale insulin AC for now -Consult diabetes education coordinator for further evaluation recommendations  HTN -Continue to Monitor BP per  Protocol and Antihypertensives held due to Sever sepsis -Last BP reading was 134/72  AKI Anion gap metabolic acidosis -BUN/Cr Trend: Recent Labs  Lab 08/16/22 1418 09/04/22 1231 09/13/22 1733 09/14/22 0727  BUN 30* 27* 41* 31*  CREATININE 0.90 1.13 1.54* 0.88  -Resume IVF with NS at 75 mL/hr -Anion gap metabolic acidosis improved and his CO2 is now 24, anion gap is 12, chloride level is 94 -Avoid Nephrotoxic Medications, Contrast Dyes, Hypotension and Dehydration to Ensure Adequate Renal Perfusion and will need to Renally Adjust Meds -Continue to Monitor and Trend Renal Function carefully and repeat CMP in the AM   Dehydration -In setting of above and now getting IV fluids  Normocytic Anemia -Hgb/Hct Trend: Recent Labs  Lab 08/16/22 1418 09/04/22 1231 09/04/22 1231 09/13/22 1733 09/14/22 0727  HGB 12.1* 11.3*  --  11.2* 10.1*  HCT 37.1* 35.5*   < > 33.6* 30.3*  MCV 83 84.1   < > 80.6 76.7*   < > = values in this interval not displayed.  -Check Anemia Panel in the AM -Continue to Monitor for S/Sx of Bleeding; No overt Bleeding noted -Repeat CBC in the AM  Hypoalbuminemia -Patient's Albumin Trend: Recent Labs  Lab 08/16/22 1418 09/04/22 1231 09/13/22 1733 09/14/22 0727  ALBUMIN 3.8* 3.2* 2.2* 1.9*  -Continue to Monitor and Trend and repeat CMP in the AM  DVT prophylaxis: SCDs Start: 09/13/22 2009    Code Status: Full Code Family Communication: No family at bedside  Disposition Plan:  Level of care: Telemetry Medical Status is: Inpatient Remains inpatient appropriate because: Is undergoing surgical intervention on Sunday   Consultants:  Orthopedic surgery  Procedures:  As delineated as above  Antimicrobials:  Anti-infectives (From admission, onward)    Start     Dose/Rate Route Frequency Ordered Stop   09/14/22 2200  vancomycin (VANCOREADY) IVPB 1250 mg/250 mL        1,250 mg 166.7 mL/hr over 90 Minutes Intravenous Every 24 hours 09/14/22 1054      09/14/22 1930  vancomycin (VANCOREADY) IVPB 1500 mg/300 mL  Status:  Discontinued        1,500 mg 150 mL/hr over 120 Minutes Intravenous Every 24 hours 09/13/22 2048 09/14/22 1054   09/14/22 1630  ceFEPIme (MAXIPIME) 2 g in sodium chloride 0.9 % 100 mL IVPB        2 g 200 mL/hr over 30 Minutes Intravenous Every 8 hours 09/14/22 1054     09/13/22 2030  ceFEPIme (MAXIPIME) 2 g in sodium chloride 0.9 % 100 mL IVPB  Status:  Discontinued        2 g 200 mL/hr over 30 Minutes Intravenous Every 12 hours 09/13/22 2015 09/14/22 1054   09/13/22 2030  metroNIDAZOLE (FLAGYL) IVPB 500 mg        500 mg 100 mL/hr over 60 Minutes Intravenous Every 12 hours 09/13/22 2015     07 /24/24 1745  vancomycin (VANCOCIN) IVPB 1000 mg/200 mL premix  Status:  Discontinued        1,000 mg 200 mL/hr over 60 Minutes Intravenous  Once 09/13/22 1735 09/13/22 1742   09/13/22 1745  vancomycin (VANCOREADY) IVPB 1750 mg/350 mL        1,750 mg 175 mL/hr over 120 Minutes Intravenous  Once 09/13/22 1742 09/13/22  2105       Subjective: Seen and examined at bedside with complaints of left-sided foot pain.  States he feels okay otherwise.  No nausea or vomiting.  Thinks that the wound has gotten worse and has an odor to it now.  No other concerns at this time.  Objective: Vitals:   09/13/22 2136 09/14/22 0500 09/14/22 0758 09/14/22 1222  BP: 109/62 118/71 (!) 99/52 134/72  Pulse: 77 82 83   Resp: 18  (!) 22   Temp: 97.9 F (36.6 C) 98.5 F (36.9 C) 98.1 F (36.7 C) 98.3 F (36.8 C)  TempSrc: Oral Oral Oral Oral  SpO2: 98% 99% 100% 100%  Weight:      Height:        Intake/Output Summary (Last 24 hours) at 09/14/2022 2023 Last data filed at 09/14/2022 1227 Gross per 24 hour  Intake 1342 ml  Output 500 ml  Net 842 ml   Filed Weights   09/13/22 1715  Weight: 88.9 kg   Examination: Physical Exam:  Constitutional: WN/WD chronically ill-appearing overweight Caucasian male who appears uncomfortable Respiratory:  Diminished to auscultation bilaterally, no wheezing, rales, rhonchi or crackles. Normal respiratory effort and patient is not tachypenic. No accessory muscle use.  Unlabored breathing Cardiovascular: RRR, no murmurs / rubs / gallops. S1 and S2 auscultated. No extremity edema. Abdomen: Soft, non-tender, distended secondary to body habitus.  Bowel sounds positive.  GU: Deferred. Musculoskeletal: Has a diabetic foot ulcer noted on the left side of his foot Skin: Has a foot ulcer on wound and erythema Neurologic: CN 2-12 grossly intact with no focal deficits. Romberg sign and cerebellar reflexes not assessed.  Psychiatric: Normal judgment and insight. Alert and oriented x 3. Normal mood and appropriate affect.   Data Reviewed: I have personally reviewed following labs and imaging studies  CBC: Recent Labs  Lab 09/13/22 1733 09/14/22 0727  WBC 28.1* 27.8*  NEUTROABS 24.7* 23.8*  HGB 11.2* 10.1*  HCT 33.6* 30.3*  MCV 80.6 76.7*  PLT 491* 467*   Basic Metabolic Panel: Recent Labs  Lab 09/13/22 1733 09/14/22 0721 09/14/22 0727  NA 120*  --  130*  K 3.9  --  3.4*  CL 83*  --  94*  CO2 19*  --  24  GLUCOSE 350*  --  194*  BUN 41*  --  31*  CREATININE 1.54*  --  0.88  CALCIUM 8.9  --  9.1  MG  --  2.4 2.4   GFR: Estimated Creatinine Clearance: 89.1 mL/min (by C-G formula based on SCr of 0.88 mg/dL). Liver Function Tests: Recent Labs  Lab 09/13/22 1733 09/14/22 0727  AST 31 25  ALT 25 22  ALKPHOS 161* 128*  BILITOT 1.2 1.0  PROT 7.5 6.8  ALBUMIN 2.2* 1.9*   No results for input(s): "LIPASE", "AMYLASE" in the last 168 hours. No results for input(s): "AMMONIA" in the last 168 hours. Coagulation Profile: Recent Labs  Lab 09/13/22 1849  INR 1.3*   Cardiac Enzymes: Recent Labs  Lab 09/14/22 0721  CKTOTAL 59   BNP (last 3 results) No results for input(s): "PROBNP" in the last 8760 hours. HbA1C: Recent Labs    09/14/22 0727  HGBA1C 9.6*   CBG: Recent Labs   Lab 09/13/22 2134 09/14/22 0548 09/14/22 1202 09/14/22 1751  GLUCAP 275* 167* 271* 286*   Lipid Profile: No results for input(s): "CHOL", "HDL", "LDLCALC", "TRIG", "CHOLHDL", "LDLDIRECT" in the last 72 hours. Thyroid Function Tests: Recent Labs  09/14/22 0727  TSH 0.757   Anemia Panel: No results for input(s): "VITAMINB12", "FOLATE", "FERRITIN", "TIBC", "IRON", "RETICCTPCT" in the last 72 hours. Sepsis Labs: Recent Labs  Lab 09/13/22 1855 09/13/22 1932  LATICACIDVEN 2.8* 1.8    Recent Results (from the past 240 hour(s))  Blood Culture (routine x 2)     Status: None (Preliminary result)   Collection Time: 09/13/22  7:20 PM   Specimen: BLOOD LEFT FOREARM  Result Value Ref Range Status   Specimen Description BLOOD LEFT FOREARM  Final   Special Requests   Final    BOTTLES DRAWN AEROBIC AND ANAEROBIC Blood Culture adequate volume   Culture  Setup Time   Final    GRAM NEGATIVE RODS ANAEROBIC BOTTLE ONLY Organism ID to follow CRITICAL RESULT CALLED TO, READ BACK BY AND VERIFIED WITHNorva Riffle PHARMD AT 1153 09/14/22 D. Leighton Roach Performed at [facility], [address], [place], [place] [address]    Culture GRAM NEGATIVE RODS  Final   Report Status PENDING  Incomplete  Blood Culture ID Panel (Reflexed)     Status: Abnormal   Collection Time: 09/13/22  7:20 PM  Result Value Ref Range Status   Enterococcus faecalis NOT DETECTED NOT DETECTED Final   Enterococcus Faecium NOT DETECTED NOT DETECTED Final   Listeria monocytogenes NOT DETECTED NOT DETECTED Final   Staphylococcus species NOT DETECTED NOT DETECTED Final   Staphylococcus aureus (BCID) NOT DETECTED NOT DETECTED Final   Staphylococcus epidermidis NOT DETECTED NOT DETECTED Final   Staphylococcus lugdunensis NOT DETECTED NOT DETECTED Final   Streptococcus species NOT DETECTED NOT DETECTED Final   Streptococcus agalactiae NOT DETECTED NOT DETECTED Final   Streptococcus pneumoniae NOT DETECTED NOT DETECTED  Final   Streptococcus pyogenes NOT DETECTED NOT DETECTED Final   A.calcoaceticus-baumannii NOT DETECTED NOT DETECTED Final   Bacteroides fragilis NOT DETECTED NOT DETECTED Final   Enterobacterales DETECTED (A) NOT DETECTED Final    Comment: Enterobacterales represent a large order of gram negative bacteria, not a single organism. CRITICAL RESULT CALLED TO, READ BACK BY AND VERIFIED WITH: E. SINCLAIR PHARMD AT 1153 09/14/22 D. VANHOOK    Enterobacter cloacae complex NOT DETECTED NOT DETECTED Final   Escherichia coli NOT DETECTED NOT DETECTED Final   Klebsiella aerogenes NOT DETECTED NOT DETECTED Final   Klebsiella oxytoca NOT DETECTED NOT DETECTED Final   Klebsiella pneumoniae NOT DETECTED NOT DETECTED Final   Proteus species DETECTED (A) NOT DETECTED Final    Comment: CRITICAL RESULT CALLED TO, READ BACK BY AND VERIFIED WITH: E. SINCLAIR PHARMD AT 1153 09/14/22 D. VANHOOK    Salmonella species NOT DETECTED NOT DETECTED Final   Serratia marcescens NOT DETECTED NOT DETECTED Final   Haemophilus influenzae NOT DETECTED NOT DETECTED Final   Neisseria meningitidis NOT DETECTED NOT DETECTED Final   Pseudomonas aeruginosa NOT DETECTED NOT DETECTED Final   Stenotrophomonas maltophilia NOT DETECTED NOT DETECTED Final   Candida albicans NOT DETECTED NOT DETECTED Final   Candida auris NOT DETECTED NOT DETECTED Final   Candida glabrata NOT DETECTED NOT DETECTED Final   Candida krusei NOT DETECTED NOT DETECTED Final   Candida parapsilosis NOT DETECTED NOT DETECTED Final   Candida tropicalis NOT DETECTED NOT DETECTED Final   Cryptococcus neoformans/gattii NOT DETECTED NOT DETECTED Final   CTX-M ESBL NOT DETECTED NOT DETECTED Final   Carbapenem resistance IMP NOT DETECTED NOT DETECTED Final   Carbapenem resistance KPC NOT DETECTED NOT DETECTED Final   Carbapenem resistance NDM NOT DETECTED NOT DETECTED  Final   Carbapenem resist OXA 48 LIKE NOT DETECTED NOT DETECTED Final   Carbapenem resistance VIM  NOT DETECTED NOT DETECTED Final    Comment: Performed at [facility], [address], [place], [place] [address]  Blood Culture (routine x 2)     Status: None (Preliminary result)   Collection Time: 09/13/22  7:31 PM   Specimen: BLOOD  Result Value Ref Range Status   Specimen Description BLOOD LEFT ANTECUBITAL  Final   Special Requests   Final    BOTTLES DRAWN AEROBIC AND ANAEROBIC Blood Culture results may not be optimal due to an excessive volume of blood received in culture bottles   Culture   Final    NO GROWTH < 12 HOURS Performed at [facility], [address] [place], [place] [address]    Report Status PENDING  Incomplete    Radiology Studies: DG Chest Port 1 View  Result Date: 09/13/2022 CLINICAL DATA:  Questionable sepsis - evaluate for abnormality EXAM: PORTABLE CHEST 1 VIEW COMPARISON:  12/28/2010 FINDINGS: The heart size and mediastinal contours are within normal limits. Both lungs are clear. The visualized skeletal structures are unremarkable. IMPRESSION: No active disease. Electronically Signed   By: Charlett Nose M.D.   On: 09/13/2022 19:18    Scheduled Meds:  insulin aspart  0-9 Units Subcutaneous Q6H   Continuous Infusions:  ceFEPime (MAXIPIME) IV 2 g (09/14/22 1700)   lactated ringers     metronidazole 500 mg (09/14/22 0925)   vancomycin      LOS: 1 day   Marguerita Merles, DO [facility] Available via Epic secure chat 7am-7pm After these hours, please refer to coverage provider listed on [email or web address] 09/14/2022, 8:23 PM

## 2022-09-14 NOTE — Hospital Course (Addendum)
The patient is a 67 year old Caucasian male with a past medical history significant for but not limited to uncontrolled diabetes mellitus type 2, essential hypertension who presents with left foot pain and has a diabetic foot ulcer with osteomyelitis.  He presented to [facility] ED on 09/04/2022 complaining of a worsening left foot wound and underwent an MRI which is notable for a large skin wound at the lateral aspect concerning for abscess and osteomyelitis at that time.  He was recommended for admission however he subsequently left AMA from the ED and did not seek medical care until presenting to the [facility] ED.  Patient reports further progression of left foot discomfort along with worsening of the left lateral foot wound and progressive erythema.  Orthopedic surgery was consulted and they are planning on a transtibial amputation on Sunday.  Further workup also reveals that he has a gram-negative bacteremia with Proteus in the setting of his diabetic foot infection.  He remains on broad-spectrum antibiotics for now and will continue Abx after Surgical Intervention.   Assessment and Plan:  Severe Sepsis 2/2 to Acute Osteomyelitis of the Left Foot with Diabetic Foot Ulcer complicated by Proteus Bacteremia -Has had progressive left lateral foot discomfort associated with progressive erythema, swelling in the context of progressive diabetic foot wounds associated left lateral aspect -MRI done on 09/04/2022 showed bone marrow edema of the cuboid and the base of the fifth metatarsal base of the proximal phalanx of the digit which is concerning for osteomyelitis -Patient has progression of the left lateral foot ulcer in the setting of poorly controlled diabetes and diabetic neuropathy -Met sepsis criteria given that he is tachycardic, tachypneic had a leukocytosis, fever and a lactic acid of 2.8 and elevated inflammatory markers; Was spiking Temperatures intermittently and in the last 24 hours had a Tmax of  99.2 -IVF now stopped  -Blood cultures x 2 showing gram-negative bacteremia with showing Proteus Mirabilis in 1 out of 4 bottles Culture  Setup Time GRAM NEGATIVE RODS IN BOTH AEROBIC AND ANAEROBIC BOTTLES Organism ID to follow CRITICAL RESULT CALLED TO, READ BACK BY AND VERIFIED WITHNorva Riffle PHARMD AT 1153 09/14/22 D. Leighton Roach Performed at [facility], [address] [place], [place] [address]  Culture PROTEUS MIRABILIS Abnormal   Report Status 09/16/2022 FINAL  Organism ID, Bacteria PROTEUS MIRABILIS  Resulting Agency [facility]     Susceptibility   Proteus mirabilis    MIC    AMPICILLIN <=2 SENSITIVE Sensitive    AMPICILLIN/SULBACTAM <=2 SENSITIVE Sensitive    CEFEPIME <=0.12 SENS... Sensitive    CEFTAZIDIME <=1 SENSITIVE Sensitive    CEFTRIAXONE <=0.25 SENS... Sensitive    CIPROFLOXACIN <=0.25 SENS... Sensitive    GENTAMICIN <=1 SENSITIVE Sensitive    IMIPENEM 4 SENSITIVE Sensitive    PIP/TAZO <=4 SENSITIVE Sensitive    TRIMETH/SULFA <=20 SENSIT... Sensitive              -WBC Trend: Recent Labs  Lab 09/13/22 1733 09/14/22 0727 09/15/22 0229 09/16/22 0349 09/16/22 2202 09/17/22 0246 09/18/22 0258  WBC 28.1* 27.8* 26.8* 27.5* 23.2* 25.3* 24.3*  -Culture of the left foot showed Morganella, Proteus and staph hemolyticus and Bacteroides -New Wound Cx showing:  Gram Stain ABUNDANT GRAM NEGATIVE RODS ABUNDANT GRAM POSITIVE COCCI FEW GRAM POSITIVE RODS FEW WBC PRESENT, PREDOMINANTLY PMN  Culture MODERATE GRAM NEGATIVE RODS IDENTIFICATION AND SUSCEPTIBILITIES TO FOLLOW CULTURE REINCUBATED FOR BETTER GROWTH Performed at [facility], 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  Diarrhea -Likely Antibiotic Mediated and improved -Check GI Pathogen Panel and if Negative will trail Imodium plan: GI pathogen panel # given that his diarrhea improved -Unlikely C Difficile and if not improving will consider obtaining C Difficile Testing  -Continue to Monitor and Trend  Hypoosmolar hypovolemic hyponatremia -Has suspected element of hypovolemia with suspected contribution from dehydration the sick clinical setting of sepsis and recent decline of oral intake Na+ Trend: Recent Labs  Lab 09/13/22 1733 09/14/22 0727 09/15/22 0229 09/16/22 0349 09/16/22 2202 09/17/22 0246 09/18/22 0258  NA 120* 130* 127* 130* 131* 128* 130*  -IVF now stopped -Replete with IV Sodium Phosphate 30 mmol yesterday  -Continue to Monitor and Trend and repeat CMP in the AM   Hypokalemia -Patient's K+ Level Trend: Recent Labs  Lab 09/13/22 1733 09/14/22 0727 09/15/22 0229 09/16/22 0349 09/16/22 2202 09/17/22 0246 09/18/22 0258  K 3.9 3.4* 3.8 3.5 3.3* 3.5 3.5  -Replete with IV Sodium Phos 30 mmol -Continue to Monitor and Replete as Necessary -Repeat CMP in the AM   Uncontrolled Diabetes Mellitus Type 2 -Include 70/30 insulin and is also on Mounjaro as an outpatient Last hemoglobin A1c was 14.3% and repeat this Visit is 9.6 -CBG and Glucose Trend: Recent Labs  Lab 09/13/22 1733 09/14/22 0727 09/15/22 0229 09/16/22 0349 09/16/22 2202 09/17/22 0246 09/18/22 0258  GLUCOSE 350* 194* 221* 163* 193* 160* 193*   Recent Labs  Lab 09/17/22 0845 09/17/22 1009 09/17/22 1144 09/17/22 1657 09/17/22 2348 09/18/22 0613 09/18/22 1206  GLUCAP 154* 173* 211* 208* 205* 170* 256*   -Continue with sensitive NovoLog/scale insulin AC for now -Consult diabetes education coordinator for further evaluation recommendations  HTN -Continue to Monitor BP per Protocol and Antihypertensives held due to Sever sepsis -Last BP reading was a little soft and is 107/60  AKI, improved Anion gap metabolic acidosis, improved  -BUN/Cr Trend: Recent Labs  Lab 09/13/22 1733 09/14/22 0727 09/15/22 0229 09/16/22 0349 09/16/22 2202 09/17/22 0246 09/18/22 0258  BUN 41* 31* 17 11 9 8 16   CREATININE 1.54* 0.88 0.71 0.80 0.78 0.72 0.82  -C/w IVF with NS at 75 mL/hr for 1 Day (was on LR at 75 mL/hr) -Anion gap metabolic acidosis improved and his CO2 is now 23, anion gap is 9, chloride level is 98 -Avoid Nephrotoxic Medications, Contrast Dyes, Hypotension and Dehydration to Ensure Adequate Renal Perfusion and will need to Renally Adjust Meds -Continue to Monitor and Trend Renal Function carefully and repeat CMP in the AM   Hypophosphatemia -Phos Level Trend: Recent Labs  Lab 09/15/22 0229 09/16/22 0349 09/17/22 0246 09/18/22 0258  PHOS 1.9* 2.7 2.0* 2.9  -Replete with IV Sodium Phos 30 mmol  -Continue to Monitor and Replete as Necessary -Repeat Phos Level in the AM  Dehydration -In setting of above and IVF now stopped  Normocytic Anemia -Hgb/Hct Trend: Recent Labs  Lab 09/13/22 1733 09/14/22 0727 09/15/22 0229 09/16/22 0349 09/16/22 2202 09/17/22 0246 09/18/22 0258  HGB 11.2* 10.1*  9.2* 10.1* 8.6* 8.7* 8.1*  HCT 33.6* 30.3* 26.5* 31.0* 26.6* 26.1* 25.6*  MCV 80.6 76.7* 78.6* 80.7 78.7* 78.1* 80.3  -Checked Anemia Panel and it showed an iron level of 14, UIBC of 144, TIBC 158, saturation ratios of 9%, ferritin level of 1652, folate of 6.8, vitamin B12 level of 602 -Continue to Monitor for S/Sx of Bleeding; No overt Bleeding noted -Repeat CBC in the AM  Hypoalbuminemia -Patient's Albumin Trend: Recent Labs  Lab 09/13/22 1733 09/14/22 0727 09/15/22 0229  09/16/22 0349 09/16/22 2202 09/17/22 0246 09/18/22 0258  ALBUMIN 2.2* 1.9* 1.5* 1.6* <1.5* <1.5* <1.5*  -Continue to Monitor and Trend and repeat CMP in the AM

## 2022-09-14 NOTE — Consult Note (Addendum)
WOC consult requested for left foot wound.   Left foot is necrotic and foul smelling, according to progress notes. MRI indicates, "Bone marrow edema of the cuboid, base of the fifth metatarsal, base of the proximal phalanx of the fifth digit concerning for osteomyelitis."  This complex medical condition is beyond the scope of practice for WOC nurses.  Progress notes indicate ortho service is pending.  Please refer to their team for further plan of care.  Please re-consult if further assistance is needed.  Thank-you,  Cammie Mcgee MSN, RN, CWOCN, [place], [place] [phone]

## 2022-09-15 ENCOUNTER — Encounter: Payer: 59 | Admitting: Vascular Surgery

## 2022-09-15 ENCOUNTER — Telehealth: Payer: Self-pay | Admitting: Radiology

## 2022-09-15 DIAGNOSIS — E860 Dehydration: Secondary | ICD-10-CM | POA: Diagnosis not present

## 2022-09-15 DIAGNOSIS — M869 Osteomyelitis, unspecified: Secondary | ICD-10-CM | POA: Diagnosis not present

## 2022-09-15 DIAGNOSIS — E871 Hypo-osmolality and hyponatremia: Secondary | ICD-10-CM | POA: Diagnosis not present

## 2022-09-15 DIAGNOSIS — N179 Acute kidney failure, unspecified: Secondary | ICD-10-CM | POA: Diagnosis not present

## 2022-09-15 LAB — GLUCOSE, CAPILLARY
Glucose-Capillary: 205 mg/dL — ABNORMAL HIGH (ref 70–99)
Glucose-Capillary: 205 mg/dL — ABNORMAL HIGH (ref 70–99)
Glucose-Capillary: 239 mg/dL — ABNORMAL HIGH (ref 70–99)
Glucose-Capillary: 254 mg/dL — ABNORMAL HIGH (ref 70–99)
Glucose-Capillary: 258 mg/dL — ABNORMAL HIGH (ref 70–99)

## 2022-09-15 LAB — CBC WITH DIFFERENTIAL/PLATELET
Lymphs Abs: 1.6 10*3/uL (ref 0.7–4.0)
RBC: 3.37 MIL/uL — ABNORMAL LOW (ref 4.22–5.81)

## 2022-09-15 MED ORDER — OXYCODONE HCL 5 MG PO TABS
5.0000 mg | ORAL_TABLET | Freq: Four times a day (QID) | ORAL | Status: DC | PRN
  Administered 2022-09-25: 5 mg via ORAL
  Filled 2022-09-15: qty 1

## 2022-09-15 MED ORDER — SODIUM PHOSPHATES 45 MMOLE/15ML IV SOLN
15.0000 mmol | Freq: Once | INTRAVENOUS | Status: AC
  Administered 2022-09-15: 15 mmol via INTRAVENOUS
  Filled 2022-09-15: qty 5

## 2022-09-15 MED ORDER — SODIUM CHLORIDE 0.9 % IV SOLN: INTRAVENOUS | Status: DC

## 2022-09-15 NOTE — Progress Notes (Signed)
PROGRESS NOTE    Grant Yang  WGN:[id number] DOB: [birth year] DOA: 09/13/2022 PCP: Billie Lade, MD   Brief Narrative:  The patient is a 67 year old Caucasian male with a past medical history significant for but not limited to uncontrolled diabetes mellitus type 2, essential hypertension who presents with left foot pain and has a diabetic foot ulcer with osteomyelitis.  He presented to [facility] ED on 09/04/2022 complaining of a worsening left foot wound and underwent an MRI which is notable for a large skin wound at the lateral aspect concerning for abscess and osteomyelitis at that time.  He was recommended for admission however he subsequently left AMA from the ED and did not seek medical care until presenting to the [facility] ED.  Patient reports further progression of left foot discomfort along with worsening of the left lateral foot wound and progressive erythema.  Orthopedic surgery was consulted and they are planning on a transtibial amputation on Sunday.  Further workup also reveals that he has a gram-negative bacteremia with Proteus in the setting of his diabetic foot infection.  He remains on broad-spectrum antibiotics for now and will continue Abx after Surgical Intervention.   Assessment and Plan:  Severe Sepsis 2/2 to Acute Osteomyelitis of the Left Foot with Diabetic Foot Ulcer complicated by Proteus Bacteremia -Has had progressive left lateral foot discomfort associated with progressive erythema, swelling in the context of progressive diabetic foot wounds associated left lateral aspect -MRI done on 09/04/2022 showed bone marrow edema of the cuboid and the base of the fifth metatarsal base of the proximal phalanx of the digit which is concerning for osteomyelitis -Patient has progression of the left lateral foot ulcer in the setting of poorly controlled diabetes and diabetic neuropathy -Met sepsis criteria given that he is tachycardic, tachypneic had a leukocytosis, fever and a lactic  acid of 2.8 and elevated inflammatory markers -He did not get the 30 mL per kilogram IV fluid bolus was given 1 L fluid bolus and started on maintenance IV fluids which at times but I have resumed with LR at 75 MLS per hour x 1 day -Blood cultures x 2 showing gram-negative bacteremia with showing Proteus Mirabilis in 1 out of 4 bottles -WBC Trend: Recent Labs  Lab 09/04/22 1231 09/13/22 1733 09/14/22 0727 09/15/22 0229  WBC 19.8* 28.1* 27.8* 26.8*  -Culture of the left foot showed Morganella, Proteus and staph hemolyticus and Bacteroides -Continuing antibiotics were started with IV vancomycin, cefepime and Flagyl for now -Follow-up on cultures -Orthopedic surgery consulted and planning for a left BKA given the severity of infection and tentative Surgical Plan is for Sunday 09/17/22 -Will continue antibiotics and even after patient completes his amputation will need antibiotics after that given that he is bacteremic -Continue with pain control  Hypoosmolar hypovolemic hyponatremia -Has suspected element of hypovolemia with suspected contribution from dehydration the sick clinical setting of sepsis and recent decline of oral intake Na+ Trend: Recent Labs  Lab 09/04/22 1231 09/13/22 1733 09/14/22 0727 09/15/22 0229  NA 134* 120* 130* 127*  -Change LR to NS at 75 mL/hr -Continue to Monitor and Trend and repeat CMP in the AM   Hypokalemia -Patient's K+ Level Trend: Recent Labs  Lab 09/04/22 1231 09/13/22 1733 09/14/22 0727 09/15/22 0229  K 4.3 3.9 3.4* 3.8  -Replete with po Kcl 40 mEQ -Continue to Monitor and Replete as Necessary -Repeat CMP in the AM   Uncontrolled Diabetes Mellitus Type 2 -Include 70/30 insulin and is also on [facility]  as an outpatient Last hemoglobin A1c was 14.3% and repeat this Visit is 9.6 -CBG and Glucose Trend: Recent Labs  Lab 09/04/22 1231 09/13/22 1733 09/14/22 0727 09/15/22 0229  GLUCOSE 248* 350* 194* 221*  - Recent Labs  Lab  09/13/22 2134 09/14/22 0548 09/14/22 1202 09/14/22 1751 09/15/22 0031 09/15/22 0617 09/15/22 1200  GLUCAP 275* 167* 271* 286* 254* 239* 205*  -Continue with sensitive NovoLog/scale insulin AC for now -Consult diabetes education coordinator for further evaluation recommendations  HTN -Continue to Monitor BP per Protocol and Antihypertensives held due to Sever sepsis -Last BP reading was 127/61  AKI Anion gap metabolic acidosis -BUN/Cr Trend: Recent Labs  Lab 09/04/22 1231 09/13/22 1733 09/14/22 0727 09/15/22 0229  BUN 27* 41* 31* 17  CREATININE 1.13 1.54* 0.88 0.71  -Resume IVF with NS at 75 mL/hr (was on LR at 75 mL/hr) -Anion gap metabolic acidosis improved and his CO2 is now 24, anion gap is 12, chloride level is 94 -Avoid Nephrotoxic Medications, Contrast Dyes, Hypotension and Dehydration to Ensure Adequate Renal Perfusion and will need to Renally Adjust Meds -Continue to Monitor and Trend Renal Function carefully and repeat CMP in the AM   Hypophosphatemia -Phos Level Trend: Recent Labs  Lab 09/15/22 0229  PHOS 1.9*  -Replete with IV Na Phos 15 mmol -Continue to Monitor and Replete as Necessary  Dehydration -In setting of above and now getting IV fluids  Normocytic Anemia -Hgb/Hct Trend: Recent Labs  Lab 09/04/22 1231 09/13/22 1733 09/14/22 0727 09/15/22 0229  HGB 11.3* 11.2* 10.1* 9.2*  HCT 35.5* 33.6* 30.3* 26.5*  MCV 84.1 80.6 76.7* 78.6*  -Check Anemia Panel in the AM -Continue to Monitor for S/Sx of Bleeding; No overt Bleeding noted -Repeat CBC in the AM  Hypoalbuminemia -Patient's Albumin Trend: Recent Labs  Lab 09/04/22 1231 09/13/22 1733 09/14/22 0727 09/15/22 0229  ALBUMIN 3.2* 2.2* 1.9* 1.5*  -Continue to Monitor and Trend and repeat CMP in the AM  DVT prophylaxis: SCDs Start: 09/13/22 2009    Code Status: Full Code Family Communication: No family present at bedside and leaving the room when I walked in  Disposition Plan:   Level of care: Telemetry Medical Status is: Inpatient Remains inpatient appropriate because: Needs surgical intervention   Consultants:  Orthopedic Surgery  Procedures:  As delineated as above  Antimicrobials:  Anti-infectives (From admission, onward)    Start     Dose/Rate Route Frequency Ordered Stop   09/14/22 2200  vancomycin (VANCOREADY) IVPB 1250 mg/250 mL        1,250 mg 166.7 mL/hr over 90 Minutes Intravenous Every 24 hours 09/14/22 1054     09/14/22 1930  vancomycin (VANCOREADY) IVPB 1500 mg/300 mL  Status:  Discontinued        1,500 mg 150 mL/hr over 120 Minutes Intravenous Every 24 hours 09/13/22 2048 09/14/22 1054   09/14/22 1630  ceFEPIme (MAXIPIME) 2 g in sodium chloride 0.9 % 100 mL IVPB        2 g 200 mL/hr over 30 Minutes Intravenous Every 8 hours 09/14/22 1054     09/13/22 2030  ceFEPIme (MAXIPIME) 2 g in sodium chloride 0.9 % 100 mL IVPB  Status:  Discontinued        2 g 200 mL/hr over 30 Minutes Intravenous Every 12 hours 09/13/22 2015 09/14/22 1054   09/13/22 2030  metroNIDAZOLE (FLAGYL) IVPB 500 mg        500 mg 100 mL/hr over 60 Minutes Intravenous Every 12 hours 09/13/22 2015  09/13/22 1745  vancomycin (VANCOCIN) IVPB 1000 mg/200 mL premix  Status:  Discontinued        1,000 mg 200 mL/hr over 60 Minutes Intravenous  Once 09/13/22 1735 09/13/22 1742   09/13/22 1745  vancomycin (VANCOREADY) IVPB 1750 mg/350 mL        1,750 mg 175 mL/hr over 120 Minutes Intravenous  Once 09/13/22 1742 09/13/22 2105       Subjective: Seen and examined at bedside and continues to complain of some left-sided foot pain and states that his foot is "numb".  Continues to have an odor to it.  Feels very anxious about his foot being cut off.  No nausea or vomiting.  No other concerns or complaints this time.  Objective: Vitals:   09/14/22 2103 09/15/22 0612 09/15/22 0743 09/15/22 1539  BP: 123/63 128/70 (!) 95/54 127/61  Pulse: 84 82 77 91  Resp: 18 18 17 19   Temp: 99.7  F (37.6 C) 98.3 F (36.8 C) 98.5 F (36.9 C) 98.1 F (36.7 C)  TempSrc: Oral Oral Oral Oral  SpO2: 98% 98% 99% 100%  Weight:      Height:        Intake/Output Summary (Last 24 hours) at 09/15/2022 1644 Last data filed at 09/15/2022 1500 Gross per 24 hour  Intake 240 ml  Output 300 ml  Net -60 ml   Filed Weights   09/13/22 1715  Weight: 88.9 kg   Examination: Physical Exam:  Constitutional: WN/WD chronically ill-appearing overweight Caucasian male who appears very anxious Respiratory: Diminished to auscultation bilaterally with some coarse breath sounds, no wheezing, rales, rhonchi or crackles. Normal respiratory effort and patient is not tachypenic. No accessory muscle use.  Unlabored breathing Cardiovascular: RRR, no murmurs / rubs / gallops. S1 and S2 auscultated.  Abdomen: Soft, non-tender, slightly distended secondary body habitus. Bowel sounds positive.  GU: Deferred. Musculoskeletal: Has a diabetic foot ulcer noted on his left foot.  Skin: Left foot ulcer and erythema with an odor Neurologic: CN 2-12 grossly intact with no focal deficits.  Romberg sign and cerebellar reflexes not assessed.  Psychiatric: Normal judgment and insight. Alert and oriented x 3. Normal mood and appropriate affect.   Data Reviewed: I have personally reviewed following labs and imaging studies  CBC: Recent Labs  Lab 09/13/22 1733 09/14/22 0727 09/15/22 0229  WBC 28.1* 27.8* 26.8*  NEUTROABS 24.7* 23.8* 23.3*  HGB 11.2* 10.1* 9.2*  HCT 33.6* 30.3* 26.5*  MCV 80.6 76.7* 78.6*  PLT 491* 467* 503*   Basic Metabolic Panel: Recent Labs  Lab 09/13/22 1733 09/14/22 0721 09/14/22 0727 09/15/22 0229  NA 120*  --  130* 127*  K 3.9  --  3.4* 3.8  CL 83*  --  94* 97*  CO2 19*  --  24 23  GLUCOSE 350*  --  194* 221*  BUN 41*  --  31* 17  CREATININE 1.54*  --  0.88 0.71  CALCIUM 8.9  --  9.1 8.3*  MG  --  2.4 2.4 2.2  PHOS  --   --   --  1.9*   GFR: Estimated Creatinine Clearance: 98  mL/min (by C-G formula based on SCr of 0.71 mg/dL). Liver Function Tests: Recent Labs  Lab 09/13/22 1733 09/14/22 0727 09/15/22 0229  AST 31 25 19   ALT 25 22 18   ALKPHOS 161* 128* 111  BILITOT 1.2 1.0 0.5  PROT 7.5 6.8 5.9*  ALBUMIN 2.2* 1.9* 1.5*   No results for input(s): "LIPASE", "AMYLASE"  in the last 168 hours. No results for input(s): "AMMONIA" in the last 168 hours. Coagulation Profile: Recent Labs  Lab 09/13/22 1849  INR 1.3*   Cardiac Enzymes: Recent Labs  Lab 09/14/22 0721  CKTOTAL 59   BNP (last 3 results) No results for input(s): "PROBNP" in the last 8760 hours. HbA1C: Recent Labs    09/14/22 0727  HGBA1C 9.6*   CBG: Recent Labs  Lab 09/14/22 1202 09/14/22 1751 09/15/22 0031 09/15/22 0617 09/15/22 1200  GLUCAP 271* 286* 254* 239* 205*   Lipid Profile: No results for input(s): "CHOL", "HDL", "LDLCALC", "TRIG", "CHOLHDL", "LDLDIRECT" in the last 72 hours. Thyroid Function Tests: Recent Labs    09/14/22 0727  TSH 0.757   Anemia Panel: No results for input(s): "VITAMINB12", "FOLATE", "FERRITIN", "TIBC", "IRON", "RETICCTPCT" in the last 72 hours. Sepsis Labs: Recent Labs  Lab 09/13/22 1855 09/13/22 1932  LATICACIDVEN 2.8* 1.8   Recent Results (from the past 240 hour(s))  Blood Culture (routine x 2)     Status: Abnormal (Preliminary result)   Collection Time: 09/13/22  7:20 PM   Specimen: BLOOD LEFT FOREARM  Result Value Ref Range Status   Specimen Description BLOOD LEFT FOREARM  Final   Special Requests   Final    BOTTLES DRAWN AEROBIC AND ANAEROBIC Blood Culture adequate volume   Culture  Setup Time   Final    GRAM NEGATIVE RODS ANAEROBIC BOTTLE ONLY Organism ID to follow CRITICAL RESULT CALLED TO, READ BACK BY AND VERIFIED WITH: E. SINCLAIR PHARMD AT 1153 09/14/22 D. VANHOOK    Culture (A)  Final    PROTEUS MIRABILIS SUSCEPTIBILITIES TO FOLLOW Performed at [facility], [address] [place], [place] [address]    Report  Status PENDING  Incomplete  Blood Culture ID Panel (Reflexed)     Status: Abnormal   Collection Time: 09/13/22  7:20 PM  Result Value Ref Range Status   Enterococcus faecalis NOT DETECTED NOT DETECTED Final   Enterococcus Faecium NOT DETECTED NOT DETECTED Final   Listeria monocytogenes NOT DETECTED NOT DETECTED Final   Staphylococcus species NOT DETECTED NOT DETECTED Final   Staphylococcus aureus (BCID) NOT DETECTED NOT DETECTED Final   Staphylococcus epidermidis NOT DETECTED NOT DETECTED Final   Staphylococcus lugdunensis NOT DETECTED NOT DETECTED Final   Streptococcus species NOT DETECTED NOT DETECTED Final   Streptococcus agalactiae NOT DETECTED NOT DETECTED Final   Streptococcus pneumoniae NOT DETECTED NOT DETECTED Final   Streptococcus pyogenes NOT DETECTED NOT DETECTED Final   A.calcoaceticus-baumannii NOT DETECTED NOT DETECTED Final   Bacteroides fragilis NOT DETECTED NOT DETECTED Final   Enterobacterales DETECTED (A) NOT DETECTED Final    Comment: Enterobacterales represent a large order of gram negative bacteria, not a single organism. CRITICAL RESULT CALLED TO, READ BACK BY AND VERIFIED WITH: E. SINCLAIR PHARMD AT 1153 09/14/22 D. VANHOOK    Enterobacter cloacae complex NOT DETECTED NOT DETECTED Final   Escherichia coli NOT DETECTED NOT DETECTED Final   Klebsiella aerogenes NOT DETECTED NOT DETECTED Final   Klebsiella oxytoca NOT DETECTED NOT DETECTED Final   Klebsiella pneumoniae NOT DETECTED NOT DETECTED Final   Proteus species DETECTED (A) NOT DETECTED Final    Comment: CRITICAL RESULT CALLED TO, READ BACK BY AND VERIFIED WITH: E. SINCLAIR PHARMD AT 1153 09/14/22 D. VANHOOK    Salmonella species NOT DETECTED NOT DETECTED Final   Serratia marcescens NOT DETECTED NOT DETECTED Final   Haemophilus influenzae NOT DETECTED NOT DETECTED Final   Neisseria meningitidis NOT  DETECTED NOT DETECTED Final   Pseudomonas aeruginosa NOT DETECTED NOT DETECTED Final   Stenotrophomonas  maltophilia NOT DETECTED NOT DETECTED Final   Candida albicans NOT DETECTED NOT DETECTED Final   Candida auris NOT DETECTED NOT DETECTED Final   Candida glabrata NOT DETECTED NOT DETECTED Final   Candida krusei NOT DETECTED NOT DETECTED Final   Candida parapsilosis NOT DETECTED NOT DETECTED Final   Candida tropicalis NOT DETECTED NOT DETECTED Final   Cryptococcus neoformans/gattii NOT DETECTED NOT DETECTED Final   CTX-M ESBL NOT DETECTED NOT DETECTED Final   Carbapenem resistance IMP NOT DETECTED NOT DETECTED Final   Carbapenem resistance KPC NOT DETECTED NOT DETECTED Final   Carbapenem resistance NDM NOT DETECTED NOT DETECTED Final   Carbapenem resist OXA 48 LIKE NOT DETECTED NOT DETECTED Final   Carbapenem resistance VIM NOT DETECTED NOT DETECTED Final    Comment: Performed at [facility], [address], [place], [place] [address]  Blood Culture (routine x 2)     Status: None (Preliminary result)   Collection Time: 09/13/22  7:31 PM   Specimen: BLOOD  Result Value Ref Range Status   Specimen Description BLOOD LEFT ANTECUBITAL  Final   Special Requests   Final    BOTTLES DRAWN AEROBIC AND ANAEROBIC Blood Culture results may not be optimal due to an excessive volume of blood received in culture bottles   Culture   Final    NO GROWTH 2 DAYS Performed at [facility], [address] [place], [place] [address]    Report Status PENDING  Incomplete    Radiology Studies: DG Chest Port 1 View  Result Date: 09/13/2022 CLINICAL DATA:  Questionable sepsis - evaluate for abnormality EXAM: PORTABLE CHEST 1 VIEW COMPARISON:  12/28/2010 FINDINGS: The heart size and mediastinal contours are within normal limits. Both lungs are clear. The visualized skeletal structures are unremarkable. IMPRESSION: No active disease. Electronically Signed   By: Charlett Nose M.D.   On: 09/13/2022 19:18    Scheduled Meds:  insulin aspart  0-9 Units Subcutaneous Q6H   Continuous Infusions:  sodium  chloride 75 mL/hr at 09/15/22 1222   ceFEPime (MAXIPIME) IV 2 g (09/15/22 0900)   metronidazole 500 mg (09/15/22 0903)   sodium phosphate 15 mmol in dextrose 5 % 250 mL infusion 15 mmol (09/15/22 1228)   vancomycin 1,250 mg (09/14/22 2303)    LOS: 2 days   Marguerita Merles, DO [facility] Available via Epic secure chat 7am-7pm After these hours, please refer to coverage provider listed on [email or web address] 09/15/2022, 4:44 PM

## 2022-09-15 NOTE — Telephone Encounter (Signed)
Patients nurse called and states that there is an order for this patient to be NPO today due to surgery, however surgery is tomorrow.  She would like to have someone to change that order in the chart for this patient.

## 2022-09-15 NOTE — Telephone Encounter (Signed)
This pt is scheduled for a BKA on Sunday. Orders were entered today for that surgery but we do not know how to change the NPO status to start on Saturday. Do you know how to do this?

## 2022-09-16 DIAGNOSIS — E871 Hypo-osmolality and hyponatremia: Secondary | ICD-10-CM | POA: Diagnosis not present

## 2022-09-16 DIAGNOSIS — M869 Osteomyelitis, unspecified: Secondary | ICD-10-CM | POA: Diagnosis not present

## 2022-09-16 DIAGNOSIS — N179 Acute kidney failure, unspecified: Secondary | ICD-10-CM | POA: Diagnosis not present

## 2022-09-16 DIAGNOSIS — E860 Dehydration: Secondary | ICD-10-CM | POA: Diagnosis not present

## 2022-09-16 LAB — CBC WITH DIFFERENTIAL/PLATELET
Abs Immature Granulocytes: 0.6 10*3/uL — ABNORMAL HIGH (ref 0.00–0.07)
Basophils Absolute: 0 10*3/uL (ref 0.0–0.1)
Basophils Relative: 0 %
Eosinophils Absolute: 0 10*3/uL (ref 0.0–0.5)
Eosinophils Relative: 0 %
HCT: 26.6 % — ABNORMAL LOW (ref 39.0–52.0)
Hemoglobin: 8.6 g/dL — ABNORMAL LOW (ref 13.0–17.0)
Immature Granulocytes: 3 %
Lymphocytes Relative: 9 %
Lymphs Abs: 2 10*3/uL (ref 0.7–4.0)
MCH: 25.4 pg — ABNORMAL LOW (ref 26.0–34.0)
MCHC: 32.3 g/dL (ref 30.0–36.0)
MCV: 78.7 fL — ABNORMAL LOW (ref 80.0–100.0)
Monocytes Absolute: 1.5 10*3/uL — ABNORMAL HIGH (ref 0.1–1.0)
Monocytes Relative: 7 %
Neutro Abs: 18.9 10*3/uL — ABNORMAL HIGH (ref 1.7–7.7)
Neutrophils Relative %: 81 %
Platelets: 611 10*3/uL — ABNORMAL HIGH (ref 150–400)
RBC: 3.38 MIL/uL — ABNORMAL LOW (ref 4.22–5.81)
RDW: 14.4 % (ref 11.5–15.5)
WBC: 23.2 10*3/uL — ABNORMAL HIGH (ref 4.0–10.5)
nRBC: 0 % (ref 0.0–0.2)

## 2022-09-16 LAB — COMPREHENSIVE METABOLIC PANEL
ALT: 14 U/L (ref 0–44)
AST: 19 U/L (ref 15–41)
Albumin: <1.5 g/dL — ABNORMAL LOW (ref 3.5–5.0)
Alkaline Phosphatase: 103 U/L (ref 38–126)
Anion gap: 13 (ref 5–15)
BUN: 9 mg/dL (ref 8–23)
CO2: 23 mmol/L (ref 22–32)
Calcium: 8.3 mg/dL — ABNORMAL LOW (ref 8.9–10.3)
Chloride: 95 mmol/L — ABNORMAL LOW (ref 98–111)
Creatinine, Ser: 0.78 mg/dL (ref 0.61–1.24)
GFR, Estimated: >60 mL/min (ref >60)
Glucose, Bld: 193 mg/dL — ABNORMAL HIGH (ref 70–99)
Potassium: 3.3 mmol/L — ABNORMAL LOW (ref 3.5–5.1)
Sodium: 131 mmol/L — ABNORMAL LOW (ref 135–145)
Total Bilirubin: 0.5 mg/dL (ref 0.3–1.2)
Total Protein: 5.9 g/dL — ABNORMAL LOW (ref 6.5–8.1)

## 2022-09-16 LAB — GLUCOSE, CAPILLARY
Glucose-Capillary: 158 mg/dL — ABNORMAL HIGH (ref 70–99)
Glucose-Capillary: 161 mg/dL — ABNORMAL HIGH (ref 70–99)
Glucose-Capillary: 162 mg/dL — ABNORMAL HIGH (ref 70–99)

## 2022-09-16 LAB — PREALBUMIN: Prealbumin: <5 mg/dL — ABNORMAL LOW (ref 18–38)

## 2022-09-16 MED ORDER — TRANEXAMIC ACID 1000 MG/10ML IV SOLN
2000.0000 mg | INTRAVENOUS | Status: DC
  Filled 2022-09-16: qty 20

## 2022-09-16 MED ORDER — TRANEXAMIC ACID-NACL 1000-0.7 MG/100ML-% IV SOLN
1000.0000 mg | INTRAVENOUS | Status: AC
  Administered 2022-09-17: 1000 mg via INTRAVENOUS

## 2022-09-16 MED ORDER — POVIDONE-IODINE 10 % EX SWAB
2.0000 | Freq: Once | CUTANEOUS | Status: AC
  Administered 2022-09-17: 2 via TOPICAL

## 2022-09-16 MED ORDER — SODIUM CHLORIDE 0.9 % IV SOLN: INTRAVENOUS | Status: AC

## 2022-09-16 MED ORDER — CEFAZOLIN SODIUM-DEXTROSE 2-4 GM/100ML-% IV SOLN
2.0000 g | INTRAVENOUS | Status: AC
  Administered 2022-09-17: 2 g via INTRAVENOUS

## 2022-09-16 MED ORDER — POTASSIUM CHLORIDE CRYS ER 20 MEQ PO TBCR
40.0000 meq | EXTENDED_RELEASE_TABLET | Freq: Two times a day (BID) | ORAL | Status: AC
  Administered 2022-09-16 – 2022-09-17 (×2): 40 meq via ORAL
  Filled 2022-09-16 (×2): qty 2

## 2022-09-16 MED ORDER — CHLORHEXIDINE GLUCONATE 4 % EX SOLN
60.0000 mL | Freq: Once | CUTANEOUS | Status: AC
  Administered 2022-09-17: 4 via TOPICAL
  Filled 2022-09-16: qty 60

## 2022-09-16 NOTE — Progress Notes (Signed)
PROGRESS NOTE    Grant Yang  XBM:[id number] DOB: [birth year] DOA: 09/13/2022 PCP: Billie Lade, MD   Brief Narrative:  The patient is a 67 year old Caucasian male with a past medical history significant for but not limited to uncontrolled diabetes mellitus type 2, essential hypertension who presents with left foot pain and has a diabetic foot ulcer with osteomyelitis.  He presented to [facility] ED on 09/04/2022 complaining of a worsening left foot wound and underwent an MRI which is notable for a large skin wound at the lateral aspect concerning for abscess and osteomyelitis at that time.  He was recommended for admission however he subsequently left AMA from the ED and did not seek medical care until presenting to the [facility] ED.  Patient reports further progression of left foot discomfort along with worsening of the left lateral foot wound and progressive erythema.  Orthopedic surgery was consulted and they are planning on a transtibial amputation on Sunday.  Further workup also reveals that he has a gram-negative bacteremia with Proteus in the setting of his diabetic foot infection.  He remains on broad-spectrum antibiotics for now and will continue Abx after Surgical Intervention.   Assessment and Plan:  Severe Sepsis 2/2 to Acute Osteomyelitis of the Left Foot with Diabetic Foot Ulcer complicated by Proteus Bacteremia -Has had progressive left lateral foot discomfort associated with progressive erythema, swelling in the context of progressive diabetic foot wounds associated left lateral aspect -MRI done on 09/04/2022 showed bone marrow edema of the cuboid and the base of the fifth metatarsal base of the proximal phalanx of the digit which is concerning for osteomyelitis -Patient has progression of the left lateral foot ulcer in the setting of poorly controlled diabetes and diabetic neuropathy -Met sepsis criteria given that he is tachycardic, tachypneic had a leukocytosis, fever and a lactic  acid of 2.8 and elevated inflammatory markers; Still spiking Temperatures intermittently and in the last 24 hours had a Tmax of 102.3 -He did not get the 30 mL per kilogram IV fluid bolus was given 1 L fluid bolus and started on maintenance IV fluids and has now been changed to NS at 75 mL/hr x 1 Day -Blood cultures x 2 showing gram-negative bacteremia with showing Proteus Mirabilis in 1 out of 4 bottles Culture  Setup Time GRAM NEGATIVE RODS IN BOTH AEROBIC AND ANAEROBIC BOTTLES Organism ID to follow CRITICAL RESULT CALLED TO, READ BACK BY AND VERIFIED WITHNorva Riffle PHARMD AT 1153 09/14/22 D. Leighton Roach Performed at [facility], [address] [place], [place] [address]  Culture PROTEUS MIRABILIS Abnormal   Report Status 09/16/2022 FINAL  Organism ID, Bacteria PROTEUS MIRABILIS  Resulting Agency [facility]     Susceptibility   Proteus mirabilis    MIC    AMPICILLIN <=2 SENSITIVE Sensitive    AMPICILLIN/SULBACTAM <=2 SENSITIVE Sensitive    CEFEPIME <=0.12 SENS... Sensitive    CEFTAZIDIME <=1 SENSITIVE Sensitive    CEFTRIAXONE <=0.25 SENS... Sensitive    CIPROFLOXACIN <=0.25 SENS... Sensitive    GENTAMICIN <=1 SENSITIVE Sensitive    IMIPENEM 4 SENSITIVE Sensitive    PIP/TAZO <=4 SENSITIVE Sensitive    TRIMETH/SULFA <=20 SENSIT... Sensitive              -WBC Trend: Recent Labs  Lab 09/04/22 1231 09/13/22 1733 09/14/22 0727 09/15/22 0229 09/16/22 0349  WBC 19.8* 28.1* 27.8* 26.8* 27.5*  -Culture of the left foot showed Morganella, Proteus and staph hemolyticus and Bacteroides -Continuing antibiotics were started  with IV vancomycin, cefepime and Flagyl for now -Follow-up on cultures -Orthopedic surgery consulted and planning for a left BKA given the severity of infection and tentative Surgical Plan is for Sunday 09/17/22 -Will continue antibiotics and even after patient completes his amputation will need antibiotics after that given that he is bacteremic -Continue  with pain control  Hypoosmolar hypovolemic hyponatremia -Has suspected element of hypovolemia with suspected contribution from dehydration the sick clinical setting of sepsis and recent decline of oral intake Na+ Trend: Recent Labs  Lab 09/04/22 1231 09/13/22 1733 09/14/22 0727 09/15/22 0229 09/16/22 0349  NA 134* 120* 130* 127* 130*  -Change LR to NS at 75 mL/hr and continue for 1 day  -Continue to Monitor and Trend and repeat CMP in the AM   Hypokalemia -Patient's K+ Level Trend: Recent Labs  Lab 09/04/22 1231 09/13/22 1733 09/14/22 0727 09/15/22 0229 09/16/22 0349  K 4.3 3.9 3.4* 3.8 3.5  -Replete with po Kcl 40 mEQ -Continue to Monitor and Replete as Necessary -Repeat CMP in the AM   Uncontrolled Diabetes Mellitus Type 2 -Include 70/30 insulin and is also on Mounjaro as an outpatient Last hemoglobin A1c was 14.3% and repeat this Visit is 9.6 -CBG and Glucose Trend: Recent Labs  Lab 09/04/22 1231 09/13/22 1733 09/14/22 0727 09/15/22 0229 09/16/22 0349  GLUCOSE 248* 350* 194* 221* 163*  - Recent Labs  Lab 09/15/22 0031 09/15/22 0617 09/15/22 1200 09/15/22 1748 09/15/22 2312 09/16/22 0739 09/16/22 1121  GLUCAP 254* 239* 205* 258* 205* 158* 161*  -Continue with sensitive NovoLog/scale insulin AC for now -Consult diabetes education coordinator for further evaluation recommendations  HTN -Continue to Monitor BP per Protocol and Antihypertensives held due to Sever sepsis -Last BP reading was 114/66  AKI, improved Anion gap metabolic acidosis, improved  -BUN/Cr Trend: Recent Labs  Lab 09/04/22 1231 09/13/22 1733 09/14/22 0727 09/15/22 0229 09/16/22 0349  BUN 27* 41* 31* 17 11  CREATININE 1.13 1.54* 0.88 0.71 0.80  -C/w IVF with NS at 75 mL/hr for 1 Day (was on LR at 75 mL/hr) -Anion gap metabolic acidosis improved and his CO2 is now 23, anion gap is 9, chloride level is 98 -Avoid Nephrotoxic Medications, Contrast Dyes, Hypotension and Dehydration  to Ensure Adequate Renal Perfusion and will need to Renally Adjust Meds -Continue to Monitor and Trend Renal Function carefully and repeat CMP in the AM   Hypophosphatemia -Phos Level Trend: Recent Labs  Lab 09/15/22 0229 09/16/22 0349  PHOS 1.9* 2.7  -Continue to Monitor and Replete as Necessary -Repeat Phos Level in the AM  Dehydration -In setting of above and now getting IV fluids  Normocytic Anemia -Hgb/Hct Trend: Recent Labs  Lab 09/04/22 1231 09/13/22 1733 09/14/22 0727 09/15/22 0229 09/16/22 0349  HGB 11.3* 11.2* 10.1* 9.2* 10.1*  HCT 35.5* 33.6* 30.3* 26.5* 31.0*  MCV 84.1 80.6 76.7* 78.6* 80.7  -Checked Anemia Panel and it showed an iron level of 14, UIBC of 144, TIBC 158, saturation ratios of 9%, ferritin level of 1652, folate of 6.8, vitamin B12 level of 602 -Continue to Monitor for S/Sx of Bleeding; No overt Bleeding noted -Repeat CBC in the AM  Hypoalbuminemia -Patient's Albumin Trend: Recent Labs  Lab 09/04/22 1231 09/13/22 1733 09/14/22 0727 09/15/22 0229 09/16/22 0349  ALBUMIN 3.2* 2.2* 1.9* 1.5* 1.6*  -Continue to Monitor and Trend and repeat CMP in the AM   DVT prophylaxis: SCDs Start: 09/13/22 2009    Code Status: Full Code Family Communication: No family present at  bedside  Disposition Plan:  Level of care: Telemetry Medical Status is: Inpatient Remains inpatient appropriate because: Is under going Surgical intervention in the AM   Consultants:  Orthopedic Surgery  Procedures:  As delineated as above  Antimicrobials:  Anti-infectives (From admission, onward)    Start     Dose/Rate Route Frequency Ordered Stop   09/14/22 2200  vancomycin (VANCOREADY) IVPB 1250 mg/250 mL        1,250 mg 166.7 mL/hr over 90 Minutes Intravenous Every 24 hours 09/14/22 1054     09/14/22 1930  vancomycin (VANCOREADY) IVPB 1500 mg/300 mL  Status:  Discontinued        1,500 mg 150 mL/hr over 120 Minutes Intravenous Every 24 hours 09/13/22 2048 09/14/22  1054   09/14/22 1630  ceFEPIme (MAXIPIME) 2 g in sodium chloride 0.9 % 100 mL IVPB        2 g 200 mL/hr over 30 Minutes Intravenous Every 8 hours 09/14/22 1054     09/13/22 2030  ceFEPIme (MAXIPIME) 2 g in sodium chloride 0.9 % 100 mL IVPB  Status:  Discontinued        2 g 200 mL/hr over 30 Minutes Intravenous Every 12 hours 09/13/22 2015 09/14/22 1054   09/13/22 2030  metroNIDAZOLE (FLAGYL) IVPB 500 mg        500 mg 100 mL/hr over 60 Minutes Intravenous Every 12 hours 09/13/22 2015     09/13/22 1745  vancomycin (VANCOCIN) IVPB 1000 mg/200 mL premix  Status:  Discontinued        1,000 mg 200 mL/hr over 60 Minutes Intravenous  Once 09/13/22 1735 09/13/22 1742   09/13/22 1745  vancomycin (VANCOREADY) IVPB 1750 mg/350 mL        1,750 mg 175 mL/hr over 120 Minutes Intravenous  Once 09/13/22 1742 09/13/22 2105       Subjective: Seen and examined at bedside and he is still feeling very anxious.  Intermittently spiking temperatures.  No nausea or vomiting.  Denies any lightheadedness or dizziness.  Objective: Vitals:   09/16/22 0500 09/16/22 0536 09/16/22 0738 09/16/22 1445  BP:  133/69 122/63 114/66  Pulse:  79 87 78  Resp:      Temp:   99.1 F (37.3 C) 98.7 F (37.1 C)  TempSrc:   Oral Oral  SpO2:  98% 98% 98%  Weight: 88.9 kg     Height:        Intake/Output Summary (Last 24 hours) at 09/16/2022 1611 Last data filed at 09/16/2022 1320 Gross per 24 hour  Intake 1432.57 ml  Output 1220 ml  Net 212.57 ml   Filed Weights   09/13/22 1715 09/16/22 0500  Weight: 88.9 kg 88.9 kg   Examination: Physical Exam:  Constitutional: WN/WD overweight Caucasian male in no acute distress Respiratory: Diminished to auscultation bilaterally, no wheezing, rales, rhonchi or crackles. Normal respiratory effort and patient is not tachypenic. No accessory muscle use.  Unlabored breathing Cardiovascular: RRR, no murmurs / rubs / gallops. S1 and S2 auscultated. No extremity edema. Abdomen: Soft,  non-tender, distended secondary body habitus. Bowel sounds positive.  GU: Deferred. Musculoskeletal: No clubbing / cyanosis of digits/nails. No joint deformity upper and lower extremities. Skin: Has a right lateral foot wound that is necrotic and has a very foul-smelling odor Neurologic: CN 2-12 grossly intact with no focal deficits.  Romberg sign and cerebellar reflexes not assessed.  Psychiatric: Normal judgment and insight. Alert and oriented x 3. Normal mood and appropriate affect.   Data Reviewed:  I have personally reviewed following labs and imaging studies  CBC: Recent Labs  Lab 09/13/22 1733 09/14/22 0727 09/15/22 0229 09/16/22 0349  WBC 28.1* 27.8* 26.8* 27.5*  NEUTROABS 24.7* 23.8* 23.3* 22.4*  HGB 11.2* 10.1* 9.2* 10.1*  HCT 33.6* 30.3* 26.5* 31.0*  MCV 80.6 76.7* 78.6* 80.7  PLT 491* 467* 503* 579*   Basic Metabolic Panel: Recent Labs  Lab 09/13/22 1733 09/14/22 0721 09/14/22 0727 09/15/22 0229 09/16/22 0349  NA 120*  --  130* 127* 130*  K 3.9  --  3.4* 3.8 3.5  CL 83*  --  94* 97* 98  CO2 19*  --  24 23 23   GLUCOSE 350*  --  194* 221* 163*  BUN 41*  --  31* 17 11  CREATININE 1.54*  --  0.88 0.71 0.80  CALCIUM 8.9  --  9.1 8.3* 8.4*  MG  --  2.4 2.4 2.2 2.1  PHOS  --   --   --  1.9* 2.7   GFR: Estimated Creatinine Clearance: 98 mL/min (by C-G formula based on SCr of 0.8 mg/dL). Liver Function Tests: Recent Labs  Lab 09/13/22 1733 09/14/22 0727 09/15/22 0229 09/16/22 0349  AST 31 25 19 19   ALT 25 22 18 14   ALKPHOS 161* 128* 111 120  BILITOT 1.2 1.0 0.5 0.8  PROT 7.5 6.8 5.9* 6.6  ALBUMIN 2.2* 1.9* 1.5* 1.6*   No results for input(s): "LIPASE", "AMYLASE" in the last 168 hours. No results for input(s): "AMMONIA" in the last 168 hours. Coagulation Profile: Recent Labs  Lab 09/13/22 1849  INR 1.3*   Cardiac Enzymes: Recent Labs  Lab 09/14/22 0721  CKTOTAL 59   BNP (last 3 results) No results for input(s): "PROBNP" in the last 8760  hours. HbA1C: Recent Labs    09/14/22 0727  HGBA1C 9.6*   CBG: Recent Labs  Lab 09/15/22 1200 09/15/22 1748 09/15/22 2312 09/16/22 0739 09/16/22 1121  GLUCAP 205* 258* 205* 158* 161*   Lipid Profile: No results for input(s): "CHOL", "HDL", "LDLCALC", "TRIG", "CHOLHDL", "LDLDIRECT" in the last 72 hours. Thyroid Function Tests: Recent Labs    09/14/22 0727  TSH 0.757   Anemia Panel: Recent Labs    09/16/22 0349  VITAMINB12 602  FOLATE 6.8  FERRITIN 1,652*  TIBC 158*  IRON 14*  RETICCTPCT 1.5   Sepsis Labs: Recent Labs  Lab 09/13/22 1855 09/13/22 1932  LATICACIDVEN 2.8* 1.8    Recent Results (from the past 240 hour(s))  Blood Culture (routine x 2)     Status: Abnormal   Collection Time: 09/13/22  7:20 PM   Specimen: BLOOD LEFT FOREARM  Result Value Ref Range Status   Specimen Description BLOOD LEFT FOREARM  Final   Special Requests   Final    BOTTLES DRAWN AEROBIC AND ANAEROBIC Blood Culture adequate volume   Culture  Setup Time   Final    GRAM NEGATIVE RODS IN BOTH AEROBIC AND ANAEROBIC BOTTLES Organism ID to follow CRITICAL RESULT CALLED TO, READ BACK BY AND VERIFIED WITHNorva Riffle PHARMD AT 1153 09/14/22 D. Leighton Roach Performed at [facility], [address], [place], [place] [address]    Culture PROTEUS MIRABILIS (A)  Final   Report Status 09/16/2022 FINAL  Final   Organism ID, Bacteria PROTEUS MIRABILIS  Final      Susceptibility   Proteus mirabilis - MIC*    AMPICILLIN <=2 SENSITIVE Sensitive     CEFEPIME <=0.12 SENSITIVE Sensitive     CEFTAZIDIME <=  1 SENSITIVE Sensitive     CEFTRIAXONE <=0.25 SENSITIVE Sensitive     CIPROFLOXACIN <=0.25 SENSITIVE Sensitive     GENTAMICIN <=1 SENSITIVE Sensitive     IMIPENEM 4 SENSITIVE Sensitive     TRIMETH/SULFA <=20 SENSITIVE Sensitive     AMPICILLIN/SULBACTAM <=2 SENSITIVE Sensitive     PIP/TAZO <=4 SENSITIVE Sensitive     * PROTEUS MIRABILIS  Blood Culture ID Panel (Reflexed)     Status:  Abnormal   Collection Time: 09/13/22  7:20 PM  Result Value Ref Range Status   Enterococcus faecalis NOT DETECTED NOT DETECTED Final   Enterococcus Faecium NOT DETECTED NOT DETECTED Final   Listeria monocytogenes NOT DETECTED NOT DETECTED Final   Staphylococcus species NOT DETECTED NOT DETECTED Final   Staphylococcus aureus (BCID) NOT DETECTED NOT DETECTED Final   Staphylococcus epidermidis NOT DETECTED NOT DETECTED Final   Staphylococcus lugdunensis NOT DETECTED NOT DETECTED Final   Streptococcus species NOT DETECTED NOT DETECTED Final   Streptococcus agalactiae NOT DETECTED NOT DETECTED Final   Streptococcus pneumoniae NOT DETECTED NOT DETECTED Final   Streptococcus pyogenes NOT DETECTED NOT DETECTED Final   A.calcoaceticus-baumannii NOT DETECTED NOT DETECTED Final   Bacteroides fragilis NOT DETECTED NOT DETECTED Final   Enterobacterales DETECTED (A) NOT DETECTED Final    Comment: Enterobacterales represent a large order of gram negative bacteria, not a single organism. CRITICAL RESULT CALLED TO, READ BACK BY AND VERIFIED WITH: E. SINCLAIR PHARMD AT 1153 09/14/22 D. VANHOOK    Enterobacter cloacae complex NOT DETECTED NOT DETECTED Final   Escherichia coli NOT DETECTED NOT DETECTED Final   Klebsiella aerogenes NOT DETECTED NOT DETECTED Final   Klebsiella oxytoca NOT DETECTED NOT DETECTED Final   Klebsiella pneumoniae NOT DETECTED NOT DETECTED Final   Proteus species DETECTED (A) NOT DETECTED Final    Comment: CRITICAL RESULT CALLED TO, READ BACK BY AND VERIFIED WITH: E. SINCLAIR PHARMD AT 1153 09/14/22 D. VANHOOK    Salmonella species NOT DETECTED NOT DETECTED Final   Serratia marcescens NOT DETECTED NOT DETECTED Final   Haemophilus influenzae NOT DETECTED NOT DETECTED Final   Neisseria meningitidis NOT DETECTED NOT DETECTED Final   Pseudomonas aeruginosa NOT DETECTED NOT DETECTED Final   Stenotrophomonas maltophilia NOT DETECTED NOT DETECTED Final   Candida albicans NOT DETECTED  NOT DETECTED Final   Candida auris NOT DETECTED NOT DETECTED Final   Candida glabrata NOT DETECTED NOT DETECTED Final   Candida krusei NOT DETECTED NOT DETECTED Final   Candida parapsilosis NOT DETECTED NOT DETECTED Final   Candida tropicalis NOT DETECTED NOT DETECTED Final   Cryptococcus neoformans/gattii NOT DETECTED NOT DETECTED Final   CTX-M ESBL NOT DETECTED NOT DETECTED Final   Carbapenem resistance IMP NOT DETECTED NOT DETECTED Final   Carbapenem resistance KPC NOT DETECTED NOT DETECTED Final   Carbapenem resistance NDM NOT DETECTED NOT DETECTED Final   Carbapenem resist OXA 48 LIKE NOT DETECTED NOT DETECTED Final   Carbapenem resistance VIM NOT DETECTED NOT DETECTED Final    Comment: Performed at [facility], [address] [place], [place] [address]  Blood Culture (routine x 2)     Status: None (Preliminary result)   Collection Time: 09/13/22  7:31 PM   Specimen: BLOOD  Result Value Ref Range Status   Specimen Description BLOOD LEFT ANTECUBITAL  Final   Special Requests   Final    BOTTLES DRAWN AEROBIC AND ANAEROBIC Blood Culture results may not be optimal due to an excessive volume of blood received in  culture bottles   Culture  Setup Time   Final    GRAM NEGATIVE RODS ANAEROBIC BOTTLE ONLY CRITICAL RESULT CALLED TO, READ BACK BY AND VERIFIED WITH: PHARMD LISA CURRAN ON 09/15/22 @ 1809 BY DRT    Culture   Final    GRAM NEGATIVE RODS CULTURE REINCUBATED FOR BETTER GROWTH Performed at [facility], [address] [place], [place] [address]    Report Status PENDING  Incomplete    Radiology Studies: No results found.  Scheduled Meds:  insulin aspart  0-9 Units Subcutaneous Q6H   potassium chloride  40 mEq Oral BID   Continuous Infusions:  sodium chloride     ceFEPime (MAXIPIME) IV 2 g (09/16/22 6578)   metronidazole 500 mg (09/16/22 4696)   vancomycin 1,250 mg (09/15/22 2241)    LOS: 3 days   Marguerita Merles, DO [facility] Available via Epic  secure chat 7am-7pm After these hours, please refer to coverage provider listed on [email or web address] 09/16/2022, 4:11 PM

## 2022-09-16 NOTE — Progress Notes (Signed)
Pharmacy Antibiotic Note  Grant Yang is a 67 y.o. male for which pharmacy has been consulted for vancomycin dosing for diabetic foot ulcer complicated by bacteremia   Patient with a history of prostate cancer, DM, GERD, HTN, SOB, stroke.   Blood culture on 7/24 with pan sensitive proteus in 2/4 bottles. Patient is afebrile today but had a Tmax of 102.3 yesterday. Other VSS. Renal function stable with Cr now <1. WBC count remains elevated at 27.5. Patient is scheduled to have BKA tomorrow, 7/28.   Plan: Metronidazole per MD Cefepime 2g q8hr per MD Continue vancomycin 1250 mg q24hr (eAUC 502.1) for now  Monitor WBC, fever, renal function, cultures Anticipate narrowing antibiotics after surgery to cover for proteus bacteremia.    Height: 5\' 11"  (180.3 cm) Weight: 88.9 kg (196 lb) IBW/kg (Calculated) : 75.3  Temp (24hrs), Avg:99.9 F (37.7 C), Min:98.1 F (36.7 C), Max:102.3 F (39.1 C)  Recent Labs  Lab 09/13/22 1733 09/13/22 1855 09/13/22 1932 09/14/22 0727 09/15/22 0229 09/16/22 0349  WBC 28.1*  --   --  27.8* 26.8* 27.5*  CREATININE 1.54*  --   --  0.88 0.71 0.80  LATICACIDVEN  --  2.8* 1.8  --   --   --     Estimated Creatinine Clearance: 98 mL/min (by C-G formula based on SCr of 0.8 mg/dL).    Allergies  Allergen Reactions   Penicillins Other (See Comments)    Unknown reaction during childhood   Microbiology results: 7/24 blood culture: proteus mirabilis - pan sensitive  GI panel PCR: sent  C diff screen: sent  Resp panel: sent   Cefepime 7/24 >> Vancomycin 7/24 >> Flagyl 7/24 >>  Thank you for allowing pharmacy to be a part of this patient's care.  Fara Olden, PharmD, BCPS, BCOP Clinical Pharmacist

## 2022-09-16 NOTE — Plan of Care (Signed)

## 2022-09-17 ENCOUNTER — Inpatient Hospital Stay (HOSPITAL_COMMUNITY): Payer: Medicare Other | Admitting: Registered Nurse

## 2022-09-17 ENCOUNTER — Encounter (HOSPITAL_COMMUNITY): Payer: Self-pay | Admitting: Internal Medicine

## 2022-09-17 ENCOUNTER — Encounter (HOSPITAL_COMMUNITY)
Admission: EM | Disposition: A | Discharge status: Home Health | Payer: Self-pay | Source: Home / Self Care | Attending: Internal Medicine

## 2022-09-17 DIAGNOSIS — I10 Essential (primary) hypertension: Secondary | ICD-10-CM

## 2022-09-17 DIAGNOSIS — N179 Acute kidney failure, unspecified: Secondary | ICD-10-CM | POA: Diagnosis not present

## 2022-09-17 DIAGNOSIS — J449 Chronic obstructive pulmonary disease, unspecified: Secondary | ICD-10-CM

## 2022-09-17 DIAGNOSIS — M869 Osteomyelitis, unspecified: Secondary | ICD-10-CM | POA: Diagnosis not present

## 2022-09-17 DIAGNOSIS — Z87891 Personal history of nicotine dependence: Secondary | ICD-10-CM | POA: Diagnosis not present

## 2022-09-17 DIAGNOSIS — E871 Hypo-osmolality and hyponatremia: Secondary | ICD-10-CM | POA: Diagnosis not present

## 2022-09-17 DIAGNOSIS — E860 Dehydration: Secondary | ICD-10-CM | POA: Diagnosis not present

## 2022-09-17 DIAGNOSIS — M87875 Other osteonecrosis, left foot: Secondary | ICD-10-CM

## 2022-09-17 HISTORY — PX: APPLICATION OF WOUND VAC: SHX5189

## 2022-09-17 HISTORY — PX: AMPUTATION: SHX166

## 2022-09-17 LAB — CBC WITH DIFFERENTIAL/PLATELET
Abs Immature Granulocytes: 0 10*3/uL (ref 0.00–0.07)
Basophils Absolute: 0 10*3/uL (ref 0.0–0.1)
Basophils Relative: 0 %
Eosinophils Absolute: 0.3 10*3/uL (ref 0.0–0.5)
Eosinophils Relative: 1 %
HCT: 26.1 % — ABNORMAL LOW (ref 39.0–52.0)
Hemoglobin: 8.7 g/dL — ABNORMAL LOW (ref 13.0–17.0)
Lymphocytes Relative: 5 %
Lymphs Abs: 1.3 10*3/uL (ref 0.7–4.0)
MCH: 26 pg (ref 26.0–34.0)
MCHC: 33.3 g/dL (ref 30.0–36.0)
MCV: 78.1 fL — ABNORMAL LOW (ref 80.0–100.0)
Monocytes Absolute: 1.5 10*3/uL — ABNORMAL HIGH (ref 0.1–1.0)
Monocytes Relative: 6 %
Neutro Abs: 22.3 10*3/uL — ABNORMAL HIGH (ref 1.7–7.7)
Neutrophils Relative %: 88 %
Platelets: 589 10*3/uL — ABNORMAL HIGH (ref 150–400)
RBC: 3.34 MIL/uL — ABNORMAL LOW (ref 4.22–5.81)
RDW: 14.4 % (ref 11.5–15.5)
WBC: 25.3 10*3/uL — ABNORMAL HIGH (ref 4.0–10.5)
nRBC: 0 % (ref 0.0–0.2)
nRBC: 0 /100{WBCs}

## 2022-09-17 LAB — AEROBIC/ANAEROBIC CULTURE W GRAM STAIN (SURGICAL/DEEP WOUND)

## 2022-09-17 LAB — COMPREHENSIVE METABOLIC PANEL WITH GFR
ALT: 13 U/L (ref 0–44)
AST: 17 U/L (ref 15–41)
Albumin: <1.5 g/dL — ABNORMAL LOW (ref 3.5–5.0)
Alkaline Phosphatase: 104 U/L (ref 38–126)
Anion gap: 8 (ref 5–15)
BUN: 8 mg/dL (ref 8–23)
CO2: 23 mmol/L (ref 22–32)
Calcium: 8 mg/dL — ABNORMAL LOW (ref 8.9–10.3)
Chloride: 97 mmol/L — ABNORMAL LOW (ref 98–111)
Creatinine, Ser: 0.72 mg/dL (ref 0.61–1.24)
GFR, Estimated: >60 mL/min (ref >60)
Glucose, Bld: 160 mg/dL — ABNORMAL HIGH (ref 70–99)
Potassium: 3.5 mmol/L (ref 3.5–5.1)
Sodium: 128 mmol/L — ABNORMAL LOW (ref 135–145)
Total Bilirubin: 0.8 mg/dL (ref 0.3–1.2)
Total Protein: 6 g/dL — ABNORMAL LOW (ref 6.5–8.1)

## 2022-09-17 LAB — GLUCOSE, CAPILLARY
Glucose-Capillary: 154 mg/dL — ABNORMAL HIGH (ref 70–99)
Glucose-Capillary: 159 mg/dL — ABNORMAL HIGH (ref 70–99)
Glucose-Capillary: 168 mg/dL — ABNORMAL HIGH (ref 70–99)
Glucose-Capillary: 173 mg/dL — ABNORMAL HIGH (ref 70–99)
Glucose-Capillary: 205 mg/dL — ABNORMAL HIGH (ref 70–99)
Glucose-Capillary: 208 mg/dL — ABNORMAL HIGH (ref 70–99)
Glucose-Capillary: 211 mg/dL — ABNORMAL HIGH (ref 70–99)

## 2022-09-17 LAB — SURGICAL PCR SCREEN
MRSA, PCR: NEGATIVE
Staphylococcus aureus: NEGATIVE

## 2022-09-17 LAB — MAGNESIUM: Magnesium: 2 mg/dL (ref 1.7–2.4)

## 2022-09-17 LAB — PHOSPHORUS: Phosphorus: 2 mg/dL — ABNORMAL LOW (ref 2.5–4.6)

## 2022-09-17 SURGERY — AMPUTATION BELOW KNEE
Anesthesia: Regional | Site: Leg Lower | Laterality: Left

## 2022-09-17 MED ORDER — OXYCODONE HCL 5 MG PO TABS: 5.0000 mg | ORAL_TABLET | Freq: Once | ORAL | Status: DC | PRN

## 2022-09-17 MED ORDER — AMISULPRIDE (ANTIEMETIC) 5 MG/2ML IV SOLN: 10.0000 mg | Freq: Once | INTRAVENOUS | Status: DC | PRN

## 2022-09-17 MED ORDER — POLYETHYLENE GLYCOL 3350 17 G PO PACK: 17.0000 g | PACK | Freq: Every day | ORAL | Status: DC | PRN

## 2022-09-17 MED ORDER — METOPROLOL TARTRATE 5 MG/5ML IV SOLN: 2.0000 mg | INTRAVENOUS | Status: DC | PRN

## 2022-09-17 MED ORDER — BUPIVACAINE HCL (PF) 0.25 % IJ SOLN
INTRAMUSCULAR | Status: DC | PRN
  Administered 2022-09-17: 20 mL via PERINEURAL

## 2022-09-17 MED ORDER — PROPOFOL 10 MG/ML IV BOLUS
INTRAVENOUS | Status: AC
  Filled 2022-09-17: qty 20

## 2022-09-17 MED ORDER — HYDRALAZINE HCL 20 MG/ML IJ SOLN: 5.0000 mg | INTRAMUSCULAR | Status: DC | PRN

## 2022-09-17 MED ORDER — BISACODYL 5 MG PO TBEC: 5.0000 mg | DELAYED_RELEASE_TABLET | Freq: Every day | ORAL | Status: DC | PRN

## 2022-09-17 MED ORDER — HYDROMORPHONE HCL 1 MG/ML IJ SOLN: 0.5000 mg | INTRAMUSCULAR | Status: DC | PRN

## 2022-09-17 MED ORDER — PROPOFOL 10 MG/ML IV BOLUS
INTRAVENOUS | Status: DC | PRN
  Administered 2022-09-17: 100 ug/kg/min via INTRAVENOUS

## 2022-09-17 MED ORDER — ONDANSETRON HCL 4 MG/2ML IJ SOLN
INTRAMUSCULAR | Status: DC | PRN
  Administered 2022-09-17: 4 mg via INTRAVENOUS

## 2022-09-17 MED ORDER — PHENOL 1.4 % MT LIQD: 1.0000 | OROMUCOSAL | Status: DC | PRN

## 2022-09-17 MED ORDER — ACETAMINOPHEN 325 MG PO TABS: 325.0000 mg | ORAL_TABLET | Freq: Four times a day (QID) | ORAL | Status: DC | PRN

## 2022-09-17 MED ORDER — JUVEN PO PACK
1.0000 | PACK | Freq: Two times a day (BID) | ORAL | Status: DC
  Administered 2022-09-17 – 2022-09-25 (×17): 1 via ORAL
  Filled 2022-09-17 (×16): qty 1

## 2022-09-17 MED ORDER — LACTATED RINGERS IV SOLN: INTRAVENOUS | Status: DC

## 2022-09-17 MED ORDER — MAGNESIUM CITRATE PO SOLN: 1.0000 | Freq: Once | ORAL | Status: DC | PRN

## 2022-09-17 MED ORDER — GUAIFENESIN-DM 100-10 MG/5ML PO SYRP: 15.0000 mL | ORAL_SOLUTION | ORAL | Status: DC | PRN

## 2022-09-17 MED ORDER — VANCOMYCIN HCL 1000 MG IV SOLR
INTRAVENOUS | Status: DC | PRN
  Administered 2022-09-17: 1000 mg via TOPICAL

## 2022-09-17 MED ORDER — VANCOMYCIN HCL 1000 MG IV SOLR
INTRAVENOUS | Status: AC
  Filled 2022-09-17: qty 20

## 2022-09-17 MED ORDER — PANTOPRAZOLE SODIUM 40 MG PO TBEC
40.0000 mg | DELAYED_RELEASE_TABLET | Freq: Every day | ORAL | Status: DC
  Administered 2022-09-17 – 2022-09-25 (×9): 40 mg via ORAL
  Filled 2022-09-17 (×9): qty 1

## 2022-09-17 MED ORDER — PROMETHAZINE HCL 25 MG/ML IJ SOLN: 6.2500 mg | INTRAMUSCULAR | Status: DC | PRN

## 2022-09-17 MED ORDER — MIDAZOLAM HCL 2 MG/2ML IJ SOLN
INTRAMUSCULAR | Status: AC
  Filled 2022-09-17: qty 2

## 2022-09-17 MED ORDER — OXYCODONE HCL 5 MG PO TABS
10.0000 mg | ORAL_TABLET | ORAL | Status: DC | PRN
  Administered 2022-09-17 – 2022-09-22 (×5): 10 mg via ORAL
  Administered 2022-09-25: 15 mg via ORAL
  Filled 2022-09-17: qty 3
  Filled 2022-09-17 (×2): qty 2

## 2022-09-17 MED ORDER — KETOROLAC TROMETHAMINE 30 MG/ML IJ SOLN: 30.0000 mg | Freq: Once | INTRAMUSCULAR | Status: DC | PRN

## 2022-09-17 MED ORDER — CHLORHEXIDINE GLUCONATE 0.12 % MT SOLN
15.0000 mL | Freq: Once | OROMUCOSAL | Status: AC
  Administered 2022-09-17: 15 mL via OROMUCOSAL

## 2022-09-17 MED ORDER — ZINC SULFATE 220 (50 ZN) MG PO CAPS
220.0000 mg | ORAL_CAPSULE | Freq: Every day | ORAL | Status: DC
  Administered 2022-09-17 – 2022-09-25 (×9): 220 mg via ORAL
  Filled 2022-09-17 (×9): qty 1

## 2022-09-17 MED ORDER — ONDANSETRON HCL 4 MG/2ML IJ SOLN: 4.0000 mg | Freq: Four times a day (QID) | INTRAMUSCULAR | Status: DC | PRN

## 2022-09-17 MED ORDER — ORAL CARE MOUTH RINSE: 15.0000 mL | Freq: Once | OROMUCOSAL | Status: AC

## 2022-09-17 MED ORDER — ACETAMINOPHEN 10 MG/ML IV SOLN: 1000.0000 mg | Freq: Once | INTRAVENOUS | Status: DC | PRN

## 2022-09-17 MED ORDER — OXYCODONE HCL 5 MG/5ML PO SOLN: 5.0000 mg | Freq: Once | ORAL | Status: DC | PRN

## 2022-09-17 MED ORDER — FENTANYL CITRATE (PF) 250 MCG/5ML IJ SOLN
INTRAMUSCULAR | Status: DC | PRN
  Administered 2022-09-17 (×3): 25 ug via INTRAVENOUS
  Administered 2022-09-17: 50 ug via INTRAVENOUS

## 2022-09-17 MED ORDER — FENTANYL CITRATE (PF) 250 MCG/5ML IJ SOLN
INTRAMUSCULAR | Status: AC
  Filled 2022-09-17: qty 5

## 2022-09-17 MED ORDER — MAGNESIUM SULFATE 2 GM/50ML IV SOLN: 2.0000 g | Freq: Every day | INTRAVENOUS | Status: DC | PRN

## 2022-09-17 MED ORDER — BUPIVACAINE HCL (PF) 0.5 % IJ SOLN
INTRAMUSCULAR | Status: DC | PRN
Start: 2022-09-17 — End: 2022-09-17
  Administered 2022-09-17: 30 mL via PERINEURAL

## 2022-09-17 MED ORDER — SODIUM PHOSPHATES 45 MMOLE/15ML IV SOLN
30.0000 mmol | Freq: Once | INTRAVENOUS | Status: AC
  Administered 2022-09-17: 30 mmol via INTRAVENOUS
  Filled 2022-09-17: qty 10

## 2022-09-17 MED ORDER — 0.9 % SODIUM CHLORIDE (POUR BTL) OPTIME
TOPICAL | Status: DC | PRN
  Administered 2022-09-17 (×2): 1000 mL

## 2022-09-17 MED ORDER — INSULIN ASPART 100 UNIT/ML IJ SOLN: 0.0000 [IU] | INTRAMUSCULAR | Status: DC | PRN

## 2022-09-17 MED ORDER — MIDAZOLAM HCL 2 MG/2ML IJ SOLN
INTRAMUSCULAR | Status: DC | PRN
  Administered 2022-09-17 (×2): 1 mg via INTRAVENOUS

## 2022-09-17 MED ORDER — OXYCODONE HCL 5 MG PO TABS
5.0000 mg | ORAL_TABLET | ORAL | Status: DC | PRN
  Administered 2022-09-19: 10 mg via ORAL
  Administered 2022-09-19: 5 mg via ORAL
  Administered 2022-09-20 – 2022-09-24 (×4): 10 mg via ORAL
  Filled 2022-09-17 (×9): qty 2

## 2022-09-17 MED ORDER — CEFAZOLIN SODIUM-DEXTROSE 2-4 GM/100ML-% IV SOLN
INTRAVENOUS | Status: AC
  Filled 2022-09-17: qty 100

## 2022-09-17 MED ORDER — DOCUSATE SODIUM 100 MG PO CAPS
100.0000 mg | ORAL_CAPSULE | Freq: Every day | ORAL | Status: DC
  Administered 2022-09-18 – 2022-09-25 (×8): 100 mg via ORAL
  Filled 2022-09-17 (×8): qty 1

## 2022-09-17 MED ORDER — LABETALOL HCL 5 MG/ML IV SOLN: 10.0000 mg | INTRAVENOUS | Status: DC | PRN

## 2022-09-17 MED ORDER — FENTANYL CITRATE (PF) 100 MCG/2ML IJ SOLN: 25.0000 ug | INTRAMUSCULAR | Status: DC | PRN

## 2022-09-17 MED ORDER — TRANEXAMIC ACID-NACL 1000-0.7 MG/100ML-% IV SOLN
INTRAVENOUS | Status: AC
  Filled 2022-09-17: qty 100

## 2022-09-17 MED ORDER — ALUM & MAG HYDROXIDE-SIMETH 200-200-20 MG/5ML PO SUSP: 15.0000 mL | ORAL | Status: DC | PRN

## 2022-09-17 MED ORDER — POTASSIUM CHLORIDE CRYS ER 20 MEQ PO TBCR: 20.0000 meq | EXTENDED_RELEASE_TABLET | Freq: Every day | ORAL | Status: DC | PRN

## 2022-09-17 MED ORDER — VITAMIN C 500 MG PO TABS
1000.0000 mg | ORAL_TABLET | Freq: Every day | ORAL | Status: DC
  Administered 2022-09-17 – 2022-09-25 (×9): 1000 mg via ORAL
  Filled 2022-09-17 (×9): qty 2

## 2022-09-17 MED ORDER — PHENYLEPHRINE 80 MCG/ML (10ML) SYRINGE FOR IV PUSH (FOR BLOOD PRESSURE SUPPORT)
PREFILLED_SYRINGE | INTRAVENOUS | Status: DC | PRN
  Administered 2022-09-17: 80 ug via INTRAVENOUS

## 2022-09-17 MED ORDER — SODIUM CHLORIDE 0.9 % IV SOLN: INTRAVENOUS | Status: DC

## 2022-09-17 MED ORDER — TRANEXAMIC ACID 1000 MG/10ML IV SOLN
INTRAVENOUS | Status: DC | PRN
  Administered 2022-09-17: 2000 mg via TOPICAL

## 2022-09-17 MED ORDER — EPHEDRINE SULFATE-NACL 50-0.9 MG/10ML-% IV SOSY
PREFILLED_SYRINGE | INTRAVENOUS | Status: DC | PRN
  Administered 2022-09-17: 5 mg via INTRAVENOUS

## 2022-09-17 SURGICAL SUPPLY — 38 items
BLADE SAW RECIP 87.9 MT (BLADE) ×2 IMPLANT
BLADE SURG 21 STRL SS (BLADE) ×2 IMPLANT
CANISTER WOUND CARE 500ML ATS (WOUND CARE) ×2 IMPLANT
COVER SURGICAL LIGHT HANDLE (MISCELLANEOUS) ×2 IMPLANT
CUFF TOURN SGL QUICK 34 (TOURNIQUET CUFF) ×2
CUFF TRNQT CYL 34X4.125X (TOURNIQUET CUFF) ×2 IMPLANT
DRAPE INCISE IOBAN 66X45 STRL (DRAPES) ×2 IMPLANT
DRAPE U-SHAPE 47X51 STRL (DRAPES) ×2 IMPLANT
DRESSING PREVENA PLUS CUSTOM (GAUZE/BANDAGES/DRESSINGS) ×2 IMPLANT
DRESSING VERAFLO CLEANS CC MED (GAUZE/BANDAGES/DRESSINGS) IMPLANT
DRSG PREVENA PLUS CUSTOM (GAUZE/BANDAGES/DRESSINGS) ×2
DRSG VERAFLO CLEANSE CC MED (GAUZE/BANDAGES/DRESSINGS) ×2
DURAPREP 26ML APPLICATOR (WOUND CARE) ×2 IMPLANT
ELECT REM PT RETURN 9FT ADLT (ELECTROSURGICAL) ×2
ELECTRODE REM PT RTRN 9FT ADLT (ELECTROSURGICAL) ×2 IMPLANT
GLOVE BIOGEL PI IND STRL 9 (GLOVE) ×2 IMPLANT
GLOVE SURG ORTHO 9.0 STRL STRW (GLOVE) ×2 IMPLANT
GOWN STRL REUS W/ TWL XL LVL3 (GOWN DISPOSABLE) ×4 IMPLANT
GOWN STRL REUS W/TWL XL LVL3 (GOWN DISPOSABLE) ×6
GRAFT SKIN WND MICRO 38 (Tissue) IMPLANT
KIT BASIN OR (CUSTOM PROCEDURE TRAY) ×2 IMPLANT
KIT TURNOVER KIT B (KITS) ×2 IMPLANT
MANIFOLD NEPTUNE II (INSTRUMENTS) ×2 IMPLANT
NS IRRIG 1000ML POUR BTL (IV SOLUTION) ×2 IMPLANT
PACK ORTHO EXTREMITY (CUSTOM PROCEDURE TRAY) ×2 IMPLANT
PAD ARMBOARD 7.5X6 YLW CONV (MISCELLANEOUS) ×2 IMPLANT
PAD NEG PRESSURE SENSATRAC (MISCELLANEOUS) IMPLANT
PREVENA RESTOR ARTHOFORM 46X30 (CANNISTER) ×2 IMPLANT
SPONGE T-LAP 18X18 ~~LOC~~+RFID (SPONGE) IMPLANT
STAPLER VISISTAT 35W (STAPLE) IMPLANT
STOCKINETTE IMPERVIOUS LG (DRAPES) ×2 IMPLANT
SUT ETHILON 2 0 PSLX (SUTURE) IMPLANT
SUT SILK 2 0 (SUTURE) ×2
SUT SILK 2-0 18XBRD TIE 12 (SUTURE) ×2 IMPLANT
SUT VIC AB 1 CTX 27 (SUTURE) ×4 IMPLANT
TOWEL GREEN STERILE (TOWEL DISPOSABLE) ×2 IMPLANT
TUBE CONNECTING 12X1/4 (SUCTIONS) ×2 IMPLANT
YANKAUER SUCT BULB TIP NO VENT (SUCTIONS) ×2 IMPLANT

## 2022-09-17 NOTE — Anesthesia Procedure Notes (Signed)
Anesthesia Regional Block: Popliteal block   Pre-Anesthetic Checklist: , timeout performed,  Correct Patient, Correct Site, Correct Laterality,  Correct Procedure, Correct Position, site marked,  Risks and benefits discussed,  Surgical consent,  Pre-op evaluation,  At surgeon's request and post-op pain management  Laterality: Left  Prep: chloraprep       Needles:  Injection technique: Single-shot  Needle Type: Echogenic Stimulator Needle     Needle Length: 10cm  Needle Gauge: 20     Additional Needles:   Procedures:,,,, ultrasound used (permanent image in chart),,    Narrative:  Start time: 09/17/2022 7:10 AM End time: 09/17/2022 7:20 AM Injection made incrementally with aspirations every 5 mL.  Performed by: Personally  Anesthesiologist: Leonides Grills, MD  Additional Notes: Functioning IV was confirmed and monitors were applied.  A timeout was performed. Sterile prep, hand hygiene and sterile gloves were used. A 20ga Bbraun echogenic stimulator needle was used. Negative aspiration and negative test dose prior to incremental administration of local anesthetic. The patient tolerated the procedure well.  Ultrasound guidance: relevent anatomy identified, needle position confirmed, local anesthetic spread visualized around nerve(s), vascular puncture avoided.  Image printed for medical record.

## 2022-09-17 NOTE — Op Note (Signed)
09/17/2022  8:58 AM  PATIENT:  Grant Yang    PRE-OPERATIVE DIAGNOSIS:  left foot osteonecrosis  POST-OPERATIVE DIAGNOSIS:  Same  PROCEDURE:  AMPUTATION BELOW KNEE  Application of Kerecis micro graft 38 cm. Application 1 g vancomycin powder.  Application of Prevena customizable and Prevena arthroform wound VAC dressings. Application of Vive Wear stump shrinker and the Hanger limb protector  Tissue and purulence from the amputation site sent for cultures.  SURGEON:  Nadara Mustard, MD  Assistant: Hart Carwin  ANESTHESIA:   General  PREOPERATIVE INDICATIONS:  Grant Yang is a  67 y.o. male with a diagnosis of left foot osteonecrosis who failed conservative measures and elected for surgical management.    The risks benefits and alternatives were discussed with the patient preoperatively including but not limited to the risks of infection, bleeding, nerve injury, cardiopulmonary complications, the need for revision surgery, among others, and the patient was willing to proceed.  OPERATIVE IMPLANTS:   Implant Name Type Inv. Item Serial No. Manufacturer Lot No. LRB No. Used Action  GRAFT SKIN WND MICRO 38 - [id number] Tissue GRAFT SKIN WND MICRO 38  [employer] [id number] Left 1 Implanted     OPERATIVE FINDINGS: Patient had purulence that extended up to the amputation site.  The muscle was healthy and viable no necrotic tissue or fascia.  Tissue was sent for cultures.  Continue IV antibiotics until cultures are finalized from the wound.  OPERATIVE PROCEDURE: Patient was brought to the operating room after undergoing a regional anesthetic.  After adequate levels anesthesia were obtained a thigh tourniquet was placed and the lower extremity was prepped using DuraPrep draped into a sterile field. The foot was draped out of the sterile field with impervious stockinette.  A timeout was called and the tourniquet inflated.  A transverse skin incision was made 12 cm distal to  the tibial tubercle, the incision curved proximally, and a large posterior flap was created.  The tibia was transected just proximal to the skin incision and beveled anteriorly.  The fibula was transected just proximal to the tibial incision.  The sciatic nerve was pulled cut and allowed to retract.  The vascular bundles were suture ligated with 2-0 silk.  The tourniquet was deflated and hemostasis obtained.    Patient had purulence that extended up to the amputation site.  The wound was irrigated several times with saline.  The muscle had good color contractility and consistency.  There was no necrotic fascial changes.  There is no infection proximal to the amputation site.  The wound bed was filled with 1 g vancomycin powder.   The Kerecis micro powder 38 cm was applied to the open wound that has a 200 cm surface area.  Vancomycin powder 1 g was applied to the wound.    The deep and superficial fascial layers were closed using #1 Vicryl.  The skin was closed using staples.    The Prevena customizable dressing was applied this was overwrapped with the arthroform sponge.  Grant Yang was used to secure the sponges and the circumferential compression was secured to the skin with Dermatac.  This was connected to the wound VAC pump and had a good suction fit this was covered with a stump shrinker and a limb protector.  Patient was taken to the PACU in stable condition.   DISCHARGE PLANNING:  Antibiotic duration: Continue IV antibiotics and base further antibiotics on culture sensitivities from the surgical wound.  Weightbearing: Nonweightbearing on the operative extremity  Pain medication: Opioid pathway  Dressing care/ Wound VAC: Continue wound VAC with the Prevena plus pump at discharge for 1 week  Ambulatory devices: Walker or kneeling scooter  Discharge to: Discharge planning based on recommendations per physical therapy  Follow-up: In the office 1 week after discharge.

## 2022-09-17 NOTE — Interval H&P Note (Signed)
History and Physical Interval Note:  09/17/2022 7:38 AM  Grant Yang  has presented today for surgery, with the diagnosis of left foot osteonecrosis.  The various methods of treatment have been discussed with the patient and family. After consideration of risks, benefits and other options for treatment, the patient has consented to  Procedure(s): AMPUTATION BELOW KNEE (Left) as a surgical intervention.  The patient's history has been reviewed, patient examined, no change in status, stable for surgery.  I have reviewed the patient's chart and labs.  Questions were answered to the patient's satisfaction.     Nadara Mustard

## 2022-09-17 NOTE — Transfer of Care (Signed)
Immediate Anesthesia Transfer of Care Note  Patient: Grant Yang  Procedure(s) Performed: AMPUTATION BELOW KNEE (Left: Knee)  Patient Location: PACU  Anesthesia Type:MAC  Level of Consciousness: awake  Airway & Oxygen Therapy: Patient Spontanous Breathing  Post-op Assessment: Report given to RN and Post -op Vital signs reviewed and stable  Post vital signs: Reviewed and stable  Last Vitals:  Vitals Value Taken Time  BP 129/76 09/17/22 0845  Temp    Pulse 80 09/17/22 0845  Resp 25 09/17/22 0845  SpO2 95 % 09/17/22 0845  Vitals shown include unfiled device data.  Last Pain:  Vitals:   09/17/22 0635  TempSrc: Oral  PainSc:          Complications: No notable events documented.

## 2022-09-17 NOTE — Anesthesia Procedure Notes (Signed)
Anesthesia Regional Block: Adductor canal block   Pre-Anesthetic Checklist: , timeout performed,  Correct Patient, Correct Site, Correct Laterality,  Correct Procedure,, site marked,  Risks and benefits discussed,  Surgical consent,  Pre-op evaluation,  At surgeon's request and post-op pain management  Laterality: Left  Prep: chloraprep       Needles:  Injection technique: Single-shot  Needle Type: Echogenic Stimulator Needle     Needle Length: 10cm  Needle Gauge: 20     Additional Needles:   Procedures:,,,, ultrasound used (permanent image in chart),,    Narrative:  Start time: 09/17/2022 7:20 AM End time: 09/17/2022 7:30 AM Injection made incrementally with aspirations every 5 mL.  Performed by: Personally  Anesthesiologist: Leonides Grills, MD  Additional Notes: Functioning IV was confirmed and monitors were applied. A time-out was performed. Hand hygiene and sterile gloves were used. The thigh was placed in a frog-leg position and prepped in a sterile fashion. A 20ga Bbraun echogenic stimulator needle was placed using ultrasound guidance.  Negative aspiration and negative test dose prior to incremental administration of local anesthetic. The patient tolerated the procedure well.

## 2022-09-17 NOTE — Progress Notes (Signed)
PT Cancellation Note  Patient Details Name: Grant Yang MRN: [id number] DOB: [birth year]   Cancelled Treatment:    Reason Eval/Treat Not Completed: Other (comment). Pt with surgery this AM. Will evaluate tomorrow.   Angelina Ok [facility] 09/17/2022, 2:25 PM Skip Mayer PT Acute [employer] [phone]

## 2022-09-17 NOTE — Progress Notes (Signed)
PROGRESS NOTE    Grant Yang  NKN:[id number] DOB: 22-Dec-1955 DOA: 09/13/2022 PCP: Billie Lade, MD   Brief Narrative:  The patient is a 67 year old Caucasian male with a past medical history significant for but not limited to uncontrolled diabetes mellitus type 2, essential hypertension who presents with left foot pain and has a diabetic foot ulcer with osteomyelitis.  He presented to [facility] ED on 09/04/2022 complaining of a worsening left foot wound and underwent an MRI which is notable for a large skin wound at the lateral aspect concerning for abscess and osteomyelitis at that time.  He was recommended for admission however he subsequently left AMA from the ED and did not seek medical care until presenting to the [facility] ED.  Patient reports further progression of left foot discomfort along with worsening of the left lateral foot wound and progressive erythema.  Orthopedic surgery was consulted and they are planning on a transtibial amputation on Sunday.  Further workup also reveals that he has a gram-negative bacteremia with Proteus in the setting of his diabetic foot infection.  He remains on broad-spectrum antibiotics for now and will continue Abx after Surgical Intervention.   Assessment and Plan:  Severe Sepsis 2/2 to Acute Osteomyelitis of the Left Foot with Diabetic Foot Ulcer complicated by Proteus Bacteremia -Has had progressive left lateral foot discomfort associated with progressive erythema, swelling in the context of progressive diabetic foot wounds associated left lateral aspect -MRI done on 09/04/2022 showed bone marrow edema of the cuboid and the base of the fifth metatarsal base of the proximal phalanx of the digit which is concerning for osteomyelitis -Patient has progression of the left lateral foot ulcer in the setting of poorly controlled diabetes and diabetic neuropathy -Met sepsis criteria given that he is tachycardic, tachypneic had a leukocytosis, fever and a lactic  acid of 2.8 and elevated inflammatory markers; Was spiking Temperatures intermittently and in the last 24 hours had a Tmax of 99.2 -IVF now stopped  -Blood cultures x 2 showing gram-negative bacteremia with showing Proteus Mirabilis in 1 out of 4 bottles Culture  Setup Time GRAM NEGATIVE RODS IN BOTH AEROBIC AND ANAEROBIC BOTTLES Organism ID to follow CRITICAL RESULT CALLED TO, READ BACK BY AND VERIFIED WITHNorva Riffle PHARMD AT 1153 09/14/22 D. Leighton Roach Performed at [facility], [address], [place], [place] [address]  Culture PROTEUS MIRABILIS Abnormal   Report Status 09/16/2022 FINAL  Organism ID, Bacteria PROTEUS MIRABILIS  Resulting Agency [facility]     Susceptibility   Proteus mirabilis    MIC    AMPICILLIN <=2 SENSITIVE Sensitive    AMPICILLIN/SULBACTAM <=2 SENSITIVE Sensitive    CEFEPIME <=0.12 SENS... Sensitive    CEFTAZIDIME <=1 SENSITIVE Sensitive    CEFTRIAXONE <=0.25 SENS... Sensitive    CIPROFLOXACIN <=0.25 SENS... Sensitive    GENTAMICIN <=1 SENSITIVE Sensitive    IMIPENEM 4 SENSITIVE Sensitive    PIP/TAZO <=4 SENSITIVE Sensitive    TRIMETH/SULFA <=20 SENSIT... Sensitive              -WBC Trend: Recent Labs  Lab 09/04/22 1231 09/13/22 1733 09/14/22 0727 09/15/22 0229 09/16/22 0349 09/16/22 2202 09/17/22 0246  WBC 19.8* 28.1* 27.8* 26.8* 27.5* 23.2* 25.3*  -Culture of the left foot showed Morganella, Proteus and staph hemolyticus and Bacteroides -Continuing antibiotics were started with IV vancomycin, cefepime and Flagyl for now -Orthopedic surgery consulted and planning for a left BKA given the severity of infection and tentative Surgical Plan is  for Sunday 09/17/22; Underwent Surgical Intervention this AM; he has been given Tranexamic Acid -Will continue antibiotics and even after patient completes his amputation will need antibiotics after that given that he is bacteremic -Continue with pain control -PT OT to further evaluate and treat and  rehab inpatient admissions coordinator consulted and awaiting therapies to help determine appropriate rehab venue  Diarrhea -Likely Antibiotic Mediated -Check GI Pathogen Panel and if Negative will trail Imodium -Unlikely C Difficile and if not improving will consider obtaining C Difficile Testing  -Continue to Monitor and Trend  Hypoosmolar hypovolemic hyponatremia -Has suspected element of hypovolemia with suspected contribution from dehydration the sick clinical setting of sepsis and recent decline of oral intake Na+ Trend: Recent Labs  Lab 09/04/22 1231 09/13/22 1733 09/14/22 0727 09/15/22 0229 09/16/22 0349 09/16/22 2202 09/17/22 0246  NA 134* 120* 130* 127* 130* 131* 128*  -IVF now stopped -Replete with IV Sodium Phosphate 30 mmol -Continue to Monitor and Trend and repeat CMP in the AM   Hypokalemia -Patient's K+ Level Trend: Recent Labs  Lab 09/04/22 1231 09/13/22 1733 09/14/22 0727 09/15/22 0229 09/16/22 0349 09/16/22 2202 09/17/22 0246  K 4.3 3.9 3.4* 3.8 3.5 3.3* 3.5  -Replete with IV Sodium Phos 30 mmol -Continue to Monitor and Replete as Necessary -Repeat CMP in the AM   Uncontrolled Diabetes Mellitus Type 2 -Include 70/30 insulin and is also on Mounjaro as an outpatient Last hemoglobin A1c was 14.3% and repeat this Visit is 9.6 -CBG and Glucose Trend: Recent Labs  Lab 09/04/22 1231 09/13/22 1733 09/14/22 0727 09/15/22 0229 09/16/22 0349 09/16/22 2202 09/17/22 0246  GLUCOSE 248* 350* 194* 221* 163* 193* 160*   Recent Labs  Lab 09/16/22 1121 09/16/22 1633 09/17/22 0000 09/17/22 0643 09/17/22 0845 09/17/22 1009 09/17/22 1144  GLUCAP 161* 162* 168* 159* 154* 173* 211*  -Continue with sensitive NovoLog/scale insulin AC for now -Consult diabetes education coordinator for further evaluation recommendations  HTN -Continue to Monitor BP per Protocol and Antihypertensives held due to Sever sepsis -Last BP reading was 119/97  AKI,  improved Anion gap metabolic acidosis, improved  -BUN/Cr Trend: Recent Labs  Lab 09/04/22 1231 09/13/22 1733 09/14/22 0727 09/15/22 0229 09/16/22 0349 09/16/22 2202 09/17/22 0246  BUN 27* 41* 31* 17 11 9 8   CREATININE 1.13 1.54* 0.88 0.71 0.80 0.78 0.72  -C/w IVF with NS at 75 mL/hr for 1 Day (was on LR at 75 mL/hr) -Anion gap metabolic acidosis improved and his CO2 is now 23, anion gap is 9, chloride level is 98 -Avoid Nephrotoxic Medications, Contrast Dyes, Hypotension and Dehydration to Ensure Adequate Renal Perfusion and will need to Renally Adjust Meds -Continue to Monitor and Trend Renal Function carefully and repeat CMP in the AM   Hypophosphatemia -Phos Level Trend: Recent Labs  Lab 09/15/22 0229 09/16/22 0349 09/17/22 0246  PHOS 1.9* 2.7 2.0*  -Replete with IV Sodium Phos 30 mmol  -Continue to Monitor and Replete as Necessary -Repeat Phos Level in the AM  Dehydration -In setting of above and IVF now stopped  Normocytic Anemia -Hgb/Hct Trend: Recent Labs  Lab 09/04/22 1231 09/13/22 1733 09/14/22 0727 09/15/22 0229 09/16/22 0349 09/16/22 2202 09/17/22 0246  HGB 11.3* 11.2* 10.1* 9.2* 10.1* 8.6* 8.7*  HCT 35.5* 33.6* 30.3* 26.5* 31.0* 26.6* 26.1*  MCV 84.1 80.6 76.7* 78.6* 80.7 78.7* 78.1*  -Checked Anemia Panel and it showed an iron level of 14, UIBC of 144, TIBC 158, saturation ratios of 9%, ferritin level of 1652, folate of  6.8, vitamin B12 level of 602 -Continue to Monitor for S/Sx of Bleeding; No overt Bleeding noted -Repeat CBC in the AM  Hypoalbuminemia -Patient's Albumin Trend: Recent Labs  Lab 09/04/22 1231 09/13/22 1733 09/14/22 0727 09/15/22 0229 09/16/22 0349 09/16/22 2202 09/17/22 0246  ALBUMIN 3.2* 2.2* 1.9* 1.5* 1.6* <1.5* <1.5*  -Continue to Monitor and Trend and repeat CMP in the AM   DVT prophylaxis: SCD's Start: 09/17/22 0927 SCDs Start: 09/13/22 2009    Code Status: Full Code Family Communication: Discussed with wife at  bedside  Disposition Plan:  Level of care: Telemetry Medical Status is: Inpatient Remains inpatient appropriate because: Just underwent surgical intervention and awaiting therapy evaluation to determine safe discharge disposition   Consultants:  Orthopedic surgery  Procedures:   PROCEDURE:  AMPUTATION BELOW KNEE   Application of Kerecis micro graft 38 cm. Application 1 g vancomycin powder.   Application of Prevena customizable and Prevena arthroform wound VAC dressings. Application of Vive Wear stump shrinker and the Hanger limb protector   Tissue and purulence from the amputation site sent for cultures.  Antimicrobials:  Anti-infectives (From admission, onward)    Start     Dose/Rate Route Frequency Ordered Stop   09/17/22 0821  vancomycin (VANCOCIN) powder  Status:  Discontinued          As needed 09/17/22 0822 09/17/22 0840   09/17/22 0640  ceFAZolin (ANCEF) 2-4 GM/100ML-% IVPB       Note to Pharmacy: Aquilla Hacker M: cabinet override      09/17/22 0640 09/17/22 0756   09/17/22 0600  ceFAZolin (ANCEF) IVPB 2g/100 mL premix        2 g 200 mL/hr over 30 Minutes Intravenous On call to O.R. 09/16/22 1944 09/17/22 0820   09/14/22 2200  vancomycin (VANCOREADY) IVPB 1250 mg/250 mL        1,250 mg 166.7 mL/hr over 90 Minutes Intravenous Every 24 hours 09/14/22 1054     09/14/22 1930  vancomycin (VANCOREADY) IVPB 1500 mg/300 mL  Status:  Discontinued        1,500 mg 150 mL/hr over 120 Minutes Intravenous Every 24 hours 09/13/22 2048 09/14/22 1054   09/14/22 1630  ceFEPIme (MAXIPIME) 2 g in sodium chloride 0.9 % 100 mL IVPB        2 g 200 mL/hr over 30 Minutes Intravenous Every 8 hours 09/14/22 1054     09/13/22 2030  ceFEPIme (MAXIPIME) 2 g in sodium chloride 0.9 % 100 mL IVPB  Status:  Discontinued        2 g 200 mL/hr over 30 Minutes Intravenous Every 12 hours 09/13/22 2015 09/14/22 1054   09/13/22 2030  metroNIDAZOLE (FLAGYL) IVPB 500 mg        500 mg 100 mL/hr over 60  Minutes Intravenous Every 12 hours 09/13/22 2015     09/13/22 1745  vancomycin (VANCOCIN) IVPB 1000 mg/200 mL premix  Status:  Discontinued        1,000 mg 200 mL/hr over 60 Minutes Intravenous  Once 09/13/22 1735 09/13/22 1742   09/13/22 1745  vancomycin (VANCOREADY) IVPB 1750 mg/350 mL        1,750 mg 175 mL/hr over 120 Minutes Intravenous  Once 09/13/22 1742 09/13/22 2105       Subjective: Seen and examined at bedside and had just come back from his surgery from his BKA and states that he is feeling good.  Wife thinks he is doing much better.  No nausea or vomiting.  Denies any  lightheadedness or dizziness.  No other concerns at present time.  Objective: Vitals:   09/17/22 0845 09/17/22 0900 09/17/22 1008 09/17/22 1456  BP: 129/76 129/68 (!) 119/97 109/73  Pulse: 80 78 72 73  Resp: 19 16    Temp: (!) 97.1 F (36.2 C) 98 F (36.7 C) (!) 97.3 F (36.3 C) 97.6 F (36.4 C)  TempSrc:    Oral  SpO2: 95% 97% 99% 99%  Weight:      Height:        Intake/Output Summary (Last 24 hours) at 09/17/2022 1507 Last data filed at 09/17/2022 1341 Gross per 24 hour  Intake 700 ml  Output 375 ml  Net 325 ml   Filed Weights   09/13/22 1715 09/16/22 0500 09/17/22 0642  Weight: 88.9 kg 88.9 kg 88.9 kg   Examination: Physical Exam:  Constitutional: WN/WD overweight chronically ill-appearing Caucasian male in no acute distress Respiratory: Diminished to auscultation bilaterally, no wheezing, rales, rhonchi or crackles. Normal respiratory effort and patient is not tachypenic. No accessory muscle use.  Unlabored breathing Cardiovascular: RRR, no murmurs / rubs / gallops. S1 and S2 auscultated. No extremity edema Abdomen: Soft, non-tender, distended secondary to body habitus. Bowel sounds positive.  GU: Deferred. Musculoskeletal: No clubbing / cyanosis of digits/nails.  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 No induration; Warm  and dry.  Neurologic: CN 2-12 grossly intact with no focal deficits.  Romberg sign and cerebellar reflexes not assessed.  Psychiatric: Normal judgment and insight. Alert and oriented x 3. Normal mood and appropriate affect.   Data Reviewed: I have personally reviewed following labs and imaging studies  CBC: Recent Labs  Lab 09/14/22 0727 09/15/22 0229 09/16/22 0349 09/16/22 2202 09/17/22 0246  WBC 27.8* 26.8* 27.5* 23.2* 25.3*  NEUTROABS 23.8* 23.3* 22.4* 18.9* 22.3*  HGB 10.1* 9.2* 10.1* 8.6* 8.7*  HCT 30.3* 26.5* 31.0* 26.6* 26.1*  MCV 76.7* 78.6* 80.7 78.7* 78.1*  PLT 467* 503* 579* 611* 589*   Basic Metabolic Panel: Recent Labs  Lab 09/14/22 0721 09/14/22 0727 09/15/22 0229 09/16/22 0349 09/16/22 2202 09/17/22 0246  NA  --  130* 127* 130* 131* 128*  K  --  3.4* 3.8 3.5 3.3* 3.5  CL  --  94* 97* 98 95* 97*  CO2  --  24 23 23 23 23   GLUCOSE  --  194* 221* 163* 193* 160*  BUN  --  31* 17 11 9 8   CREATININE  --  0.88 0.71 0.80 0.78 0.72  CALCIUM  --  9.1 8.3* 8.4* 8.3* 8.0*  MG 2.4 2.4 2.2 2.1  --  2.0  PHOS  --   --  1.9* 2.7  --  2.0*   GFR: Estimated Creatinine Clearance: 98 mL/min (by C-G formula based on SCr of 0.72 mg/dL). Liver Function Tests: Recent Labs  Lab 09/14/22 0727 09/15/22 0229 09/16/22 0349 09/16/22 2202 09/17/22 0246  AST 25 19 19 19 17   ALT 22 18 14 14 13   ALKPHOS 128* 111 120 103 104  BILITOT 1.0 0.5 0.8 0.5 0.8  PROT 6.8 5.9* 6.6 5.9* 6.0*  ALBUMIN 1.9* 1.5* 1.6* <1.5* <1.5*   No results for input(s): "LIPASE", "AMYLASE" in the last 168 hours. No results for input(s): "AMMONIA" in the last 168 hours. Coagulation Profile: Recent Labs  Lab 09/13/22 1849  INR 1.3*   Cardiac Enzymes: Recent Labs  Lab 09/14/22 0721  CKTOTAL 59   BNP (  last 3 results) No results for input(s): "PROBNP" in the last 8760 hours. HbA1C: No results for input(s): "HGBA1C" in the last 72 hours. CBG: Recent Labs  Lab 09/17/22 0000 09/17/22 0643  09/17/22 0845 09/17/22 1009 09/17/22 1144  GLUCAP 168* 159* 154* 173* 211*   Lipid Profile: No results for input(s): "CHOL", "HDL", "LDLCALC", "TRIG", "CHOLHDL", "LDLDIRECT" in the last 72 hours. Thyroid Function Tests: No results for input(s): "TSH", "T4TOTAL", "FREET4", "T3FREE", "THYROIDAB" in the last 72 hours. Anemia Panel: Recent Labs    09/16/22 0349  VITAMINB12 602  FOLATE 6.8  FERRITIN 1,652*  TIBC 158*  IRON 14*  RETICCTPCT 1.5   Sepsis Labs: Recent Labs  Lab 09/13/22 1855 09/13/22 1932  LATICACIDVEN 2.8* 1.8   Recent Results (from the past 240 hour(s))  Blood Culture (routine x 2)     Status: Abnormal   Collection Time: 09/13/22  7:20 PM   Specimen: BLOOD LEFT FOREARM  Result Value Ref Range Status   Specimen Description BLOOD LEFT FOREARM  Final   Special Requests   Final    BOTTLES DRAWN AEROBIC AND ANAEROBIC Blood Culture adequate volume   Culture  Setup Time   Final    GRAM NEGATIVE RODS IN BOTH AEROBIC AND ANAEROBIC BOTTLES Organism ID to follow CRITICAL RESULT CALLED TO, READ BACK BY AND VERIFIED WITHNorva Riffle PHARMD AT 1153 09/14/22 D. Leighton Roach Performed at [facility], [address] [place], [place] [address]    Culture PROTEUS MIRABILIS (A)  Final   Report Status 09/16/2022 FINAL  Final   Organism ID, Bacteria PROTEUS MIRABILIS  Final      Susceptibility   Proteus mirabilis - MIC*    AMPICILLIN <=2 SENSITIVE Sensitive     CEFEPIME <=0.12 SENSITIVE Sensitive     CEFTAZIDIME <=1 SENSITIVE Sensitive     CEFTRIAXONE <=0.25 SENSITIVE Sensitive     CIPROFLOXACIN <=0.25 SENSITIVE Sensitive     GENTAMICIN <=1 SENSITIVE Sensitive     IMIPENEM 4 SENSITIVE Sensitive     TRIMETH/SULFA <=20 SENSITIVE Sensitive     AMPICILLIN/SULBACTAM <=2 SENSITIVE Sensitive     PIP/TAZO <=4 SENSITIVE Sensitive     * PROTEUS MIRABILIS  Blood Culture ID Panel (Reflexed)     Status: Abnormal   Collection Time: 09/13/22  7:20 PM  Result Value Ref Range  Status   Enterococcus faecalis NOT DETECTED NOT DETECTED Final   Enterococcus Faecium NOT DETECTED NOT DETECTED Final   Listeria monocytogenes NOT DETECTED NOT DETECTED Final   Staphylococcus species NOT DETECTED NOT DETECTED Final   Staphylococcus aureus (BCID) NOT DETECTED NOT DETECTED Final   Staphylococcus epidermidis NOT DETECTED NOT DETECTED Final   Staphylococcus lugdunensis NOT DETECTED NOT DETECTED Final   Streptococcus species NOT DETECTED NOT DETECTED Final   Streptococcus agalactiae NOT DETECTED NOT DETECTED Final   Streptococcus pneumoniae NOT DETECTED NOT DETECTED Final   Streptococcus pyogenes NOT DETECTED NOT DETECTED Final   A.calcoaceticus-baumannii NOT DETECTED NOT DETECTED Final   Bacteroides fragilis NOT DETECTED NOT DETECTED Final   Enterobacterales DETECTED (A) NOT DETECTED Final    Comment: Enterobacterales represent a large order of gram negative bacteria, not a single organism. CRITICAL RESULT CALLED TO, READ BACK BY AND VERIFIED WITH: E. SINCLAIR PHARMD AT 1153 09/14/22 D. VANHOOK    Enterobacter cloacae complex NOT DETECTED NOT DETECTED Final   Escherichia coli NOT DETECTED NOT DETECTED Final   Klebsiella aerogenes NOT DETECTED NOT DETECTED Final   Klebsiella oxytoca NOT DETECTED NOT DETECTED Final  Klebsiella pneumoniae NOT DETECTED NOT DETECTED Final   Proteus species DETECTED (A) NOT DETECTED Final    Comment: CRITICAL RESULT CALLED TO, READ BACK BY AND VERIFIED WITH: E. SINCLAIR PHARMD AT 1153 09/14/22 D. VANHOOK    Salmonella species NOT DETECTED NOT DETECTED Final   Serratia marcescens NOT DETECTED NOT DETECTED Final   Haemophilus influenzae NOT DETECTED NOT DETECTED Final   Neisseria meningitidis NOT DETECTED NOT DETECTED Final   Pseudomonas aeruginosa NOT DETECTED NOT DETECTED Final   Stenotrophomonas maltophilia NOT DETECTED NOT DETECTED Final   Candida albicans NOT DETECTED NOT DETECTED Final   Candida auris NOT DETECTED NOT DETECTED Final    Candida glabrata NOT DETECTED NOT DETECTED Final   Candida krusei NOT DETECTED NOT DETECTED Final   Candida parapsilosis NOT DETECTED NOT DETECTED Final   Candida tropicalis NOT DETECTED NOT DETECTED Final   Cryptococcus neoformans/gattii NOT DETECTED NOT DETECTED Final   CTX-M ESBL NOT DETECTED NOT DETECTED Final   Carbapenem resistance IMP NOT DETECTED NOT DETECTED Final   Carbapenem resistance KPC NOT DETECTED NOT DETECTED Final   Carbapenem resistance NDM NOT DETECTED NOT DETECTED Final   Carbapenem resist OXA 48 LIKE NOT DETECTED NOT DETECTED Final   Carbapenem resistance VIM NOT DETECTED NOT DETECTED Final    Comment: Performed at [facility], [address], [place], [place] [address]  Blood Culture (routine x 2)     Status: Abnormal   Collection Time: 09/13/22  7:31 PM   Specimen: BLOOD  Result Value Ref Range Status   Specimen Description BLOOD LEFT ANTECUBITAL  Final   Special Requests   Final    BOTTLES DRAWN AEROBIC AND ANAEROBIC Blood Culture results may not be optimal due to an excessive volume of blood received in culture bottles   Culture  Setup Time   Final    GRAM NEGATIVE RODS ANAEROBIC BOTTLE ONLY CRITICAL RESULT CALLED TO, READ BACK BY AND VERIFIED WITH: PHARMD LISA CURRAN ON 09/15/22 @ 1809 BY DRT    Culture (A)  Final    PREVOTELLA BIVIA BETA LACTAMASE POSITIVE Performed at [facility], [address] [place], [place] [address]    Report Status 09/17/2022 FINAL  Final  Surgical pcr screen     Status: None   Collection Time: 09/16/22  4:50 PM   Specimen: Nasal Mucosa; Nasal Swab  Result Value Ref Range Status   MRSA, PCR NEGATIVE NEGATIVE Final   Staphylococcus aureus NEGATIVE NEGATIVE Final    Comment: (NOTE) The Xpert SA Assay (FDA approved for NASAL specimens in patients 66 years of age and older), is one component of a comprehensive surveillance program. It is not intended to diagnose infection nor to guide or monitor  treatment. Performed at [facility], [address], [place], [place] [address]   Aerobic/Anaerobic Culture w Gram Stain (surgical/deep wound)     Status: None (Preliminary result)   Collection Time: 09/17/22  8:48 AM   Specimen: Soft Tissue, Other  Result Value Ref Range Status   Specimen Description TISSUE  Final   Special Requests left leg wound  Final   Gram Stain   Final    ABUNDANT GRAM NEGATIVE RODS ABUNDANT GRAM POSITIVE COCCI FEW GRAM POSITIVE RODS FEW WBC PRESENT, PREDOMINANTLY PMN Performed at [facility], [address], [place], [place] [address]    Culture PENDING  Incomplete   Report Status PENDING  Incomplete    Radiology Studies: No results found.  Scheduled  Meds:  vitamin C  1,000 mg Oral Daily   [START ON 09/18/2022] docusate sodium  100 mg Oral Daily   insulin aspart  0-9 Units Subcutaneous Q6H   nutrition supplement (JUVEN)  1 packet Oral BID BM   pantoprazole  40 mg Oral Daily   zinc sulfate  220 mg Oral Daily   Continuous Infusions:  sodium chloride 75 mL/hr at 09/16/22 1621   sodium chloride     ceFEPime (MAXIPIME) IV 2 g (09/17/22 0127)   magnesium sulfate bolus IVPB     metronidazole 500 mg (09/16/22 2110)   sodium phosphate 30 mmol in dextrose 5 % 250 mL infusion 30 mmol (09/17/22 1214)   vancomycin 1,250 mg (09/16/22 2307)    LOS: 4 days   Marguerita Merles, DO [facility] Available via Epic secure chat 7am-7pm After these hours, please refer to coverage provider listed on [email or web address] 09/17/2022, 3:07 PM

## 2022-09-17 NOTE — Progress Notes (Signed)
Inpatient Rehab Admissions Coordinator:  Consult received. Await therapy evaluations to help determine appropriate rehab venue.   Lauren Graves Madden, MS, CCC-SLP Admissions Coordinator [phone]  

## 2022-09-17 NOTE — Anesthesia Postprocedure Evaluation (Signed)
Anesthesia Post Note  Patient: Grant Yang  Procedure(s) Performed: AMPUTATION BELOW KNEE (Left: Knee) APPLICATION OF WOUND VAC (Left: Leg Lower)     Patient location during evaluation: PACU Anesthesia Type: Regional and General Level of consciousness: awake Pain management: pain level controlled Vital Signs Assessment: post-procedure vital signs reviewed and stable Respiratory status: spontaneous breathing, nonlabored ventilation and respiratory function stable Cardiovascular status: blood pressure returned to baseline and stable Postop Assessment: no apparent nausea or vomiting Anesthetic complications: no   No notable events documented.  Last Vitals:  Vitals:   09/17/22 1008 09/17/22 1456  BP: (!) 119/97 109/73  Pulse: 72 73  Resp:    Temp: (!) 36.3 C 36.4 C  SpO2: 99% 99%    Last Pain:  Vitals:   09/17/22 1456  TempSrc: Oral  PainSc:                  Roshan Salamon P Tekoa Amon

## 2022-09-17 NOTE — Anesthesia Preprocedure Evaluation (Addendum)
Anesthesia Evaluation  Patient identified by MRN, date of birth, ID band Patient awake    Reviewed: Allergy & Precautions, NPO status , Patient's Chart, lab work & pertinent test results  Airway Mallampati: III  TM Distance: >3 FB Neck ROM: Full    Dental  (+) Loose,    Pulmonary COPD,  COPD inhaler, former smoker   Pulmonary exam normal        Cardiovascular hypertension, Pt. on medications + Peripheral Vascular Disease  Normal cardiovascular exam     Neuro/Psych TIACVA  negative psych ROS   GI/Hepatic negative GI ROS, Neg liver ROS,,,  Endo/Other  diabetes, Insulin Dependent, Oral Hypoglycemic Agents  hyponatremia  Renal/GU negative Renal ROS     Musculoskeletal negative musculoskeletal ROS (+)    Abdominal   Peds  Hematology  (+) Blood dyscrasia (Plavix), anemia   Anesthesia Other Findings left foot osteonecrosis  Reproductive/Obstetrics                              Anesthesia Physical Anesthesia Plan  ASA: 3  Anesthesia Plan: General and Regional   Post-op Pain Management: Regional block*   Induction: Intravenous  PONV Risk Score and Plan: 2 and Ondansetron, Dexamethasone, Midazolam and Treatment may vary due to age or medical condition  Airway Management Planned: LMA  Additional Equipment:   Intra-op Plan:   Post-operative Plan: Extubation in OR  Informed Consent: I have reviewed the patients History and Physical, chart, labs and discussed the procedure including the risks, benefits and alternatives for the proposed anesthesia with the patient or authorized representative who has indicated his/her understanding and acceptance.     Dental advisory given  Plan Discussed with: CRNA  Anesthesia Plan Comments:         Anesthesia Quick Evaluation

## 2022-09-18 ENCOUNTER — Encounter (HOSPITAL_COMMUNITY): Payer: Self-pay | Admitting: Orthopedic Surgery

## 2022-09-18 ENCOUNTER — Ambulatory Visit: Payer: Medicare Other | Admitting: Podiatry

## 2022-09-18 DIAGNOSIS — M86172 Other acute osteomyelitis, left ankle and foot: Secondary | ICD-10-CM

## 2022-09-18 DIAGNOSIS — E1169 Type 2 diabetes mellitus with other specified complication: Secondary | ICD-10-CM | POA: Diagnosis not present

## 2022-09-18 DIAGNOSIS — M869 Osteomyelitis, unspecified: Secondary | ICD-10-CM | POA: Diagnosis not present

## 2022-09-18 DIAGNOSIS — E871 Hypo-osmolality and hyponatremia: Secondary | ICD-10-CM | POA: Diagnosis not present

## 2022-09-18 DIAGNOSIS — Z89512 Acquired absence of left leg below knee: Secondary | ICD-10-CM

## 2022-09-18 DIAGNOSIS — B964 Proteus (mirabilis) (morganii) as the cause of diseases classified elsewhere: Secondary | ICD-10-CM | POA: Diagnosis not present

## 2022-09-18 DIAGNOSIS — N179 Acute kidney failure, unspecified: Secondary | ICD-10-CM | POA: Diagnosis not present

## 2022-09-18 DIAGNOSIS — Z89432 Acquired absence of left foot: Secondary | ICD-10-CM

## 2022-09-18 DIAGNOSIS — G8918 Other acute postprocedural pain: Secondary | ICD-10-CM

## 2022-09-18 DIAGNOSIS — B9689 Other specified bacterial agents as the cause of diseases classified elsewhere: Secondary | ICD-10-CM

## 2022-09-18 DIAGNOSIS — E860 Dehydration: Secondary | ICD-10-CM | POA: Diagnosis not present

## 2022-09-18 LAB — GLUCOSE, CAPILLARY
Glucose-Capillary: 170 mg/dL — ABNORMAL HIGH (ref 70–99)
Glucose-Capillary: 192 mg/dL — ABNORMAL HIGH (ref 70–99)
Glucose-Capillary: 224 mg/dL — ABNORMAL HIGH (ref 70–99)
Glucose-Capillary: 241 mg/dL — ABNORMAL HIGH (ref 70–99)
Glucose-Capillary: 256 mg/dL — ABNORMAL HIGH (ref 70–99)

## 2022-09-18 LAB — HEMOGLOBIN A1C
Hgb A1c MFr Bld: 9.8 % — ABNORMAL HIGH (ref 4.8–5.6)
Mean Plasma Glucose: 235 mg/dL

## 2022-09-18 NOTE — Progress Notes (Signed)
Patient ID: Grant Yang, male   DOB: 21-May-1955, 67 y.o.   MRN: [id number] Wound cultures from the amputation site are showing multiple bacteria including gram-negative rods, gram-positive cocci, gram-positive rods.  And patient's blood work was positive for prevotella.  Patient will need 3 to 4 weeks of antibiotics to cover the wound cultures.

## 2022-09-18 NOTE — Progress Notes (Signed)
PROGRESS NOTE    Grant Yang  WUJ:[id number] DOB: 01/16/1956 DOA: 09/13/2022 PCP: Billie Lade, MD   Brief Narrative:  The patient is a 67 year old Caucasian male with a past medical history significant for but not limited to uncontrolled diabetes mellitus type 2, essential hypertension who presents with left foot pain and has a diabetic foot ulcer with osteomyelitis.  He presented to [facility] ED on 09/04/2022 complaining of a worsening left foot wound and underwent an MRI which is notable for a large skin wound at the lateral aspect concerning for abscess and osteomyelitis at that time.  He was recommended for admission however he subsequently left AMA from the ED and did not seek medical care until presenting to the [facility] ED.  Patient reports further progression of left foot discomfort along with worsening of the left lateral foot wound and progressive erythema.  Orthopedic surgery was consulted and they are planning on a transtibial amputation on Sunday.  Further workup also reveals that he has a gram-negative bacteremia with Proteus in the setting of his diabetic foot infection.  He remains on broad-spectrum antibiotics for now and will continue Abx after Surgical Intervention.   Assessment and Plan:  Severe Sepsis 2/2 to Acute Osteomyelitis of the Left Foot with Diabetic Foot Ulcer complicated by Proteus Bacteremia -Has had progressive left lateral foot discomfort associated with progressive erythema, swelling in the context of progressive diabetic foot wounds associated left lateral aspect -MRI done on 09/04/2022 showed bone marrow edema of the cuboid and the base of the fifth metatarsal base of the proximal phalanx of the digit which is concerning for osteomyelitis -Patient has progression of the left lateral foot ulcer in the setting of poorly controlled diabetes and diabetic neuropathy -Met sepsis criteria given that he is tachycardic, tachypneic had a leukocytosis, fever and a lactic  acid of 2.8 and elevated inflammatory markers; Was spiking Temperatures intermittently and in the last 24 hours had a Tmax of 99.2 -IVF now stopped  -Blood cultures x 2 showing gram-negative bacteremia with showing Proteus Mirabilis in 1 out of 4 bottles Culture  Setup Time GRAM NEGATIVE RODS IN BOTH AEROBIC AND ANAEROBIC BOTTLES Organism ID to follow CRITICAL RESULT CALLED TO, READ BACK BY AND VERIFIED WITHNorva Riffle PHARMD AT 1153 09/14/22 D. Leighton Roach Performed at [facility], [address] [place], [place] [address]  Culture PROTEUS MIRABILIS Abnormal   Report Status 09/16/2022 FINAL  Organism ID, Bacteria PROTEUS MIRABILIS  Resulting Agency [facility]     Susceptibility   Proteus mirabilis    MIC    AMPICILLIN <=2 SENSITIVE Sensitive    AMPICILLIN/SULBACTAM <=2 SENSITIVE Sensitive    CEFEPIME <=0.12 SENS... Sensitive    CEFTAZIDIME <=1 SENSITIVE Sensitive    CEFTRIAXONE <=0.25 SENS... Sensitive    CIPROFLOXACIN <=0.25 SENS... Sensitive    GENTAMICIN <=1 SENSITIVE Sensitive    IMIPENEM 4 SENSITIVE Sensitive    PIP/TAZO <=4 SENSITIVE Sensitive    TRIMETH/SULFA <=20 SENSIT... Sensitive              -WBC Trend: Recent Labs  Lab 09/13/22 1733 09/14/22 0727 09/15/22 0229 09/16/22 0349 09/16/22 2202 09/17/22 0246 09/18/22 0258  WBC 28.1* 27.8* 26.8* 27.5* 23.2* 25.3* 24.3*  -Culture of the left foot showed Morganella, Proteus and staph hemolyticus and Bacteroides -New Wound Cx showing:  Gram Stain ABUNDANT GRAM NEGATIVE RODS ABUNDANT GRAM POSITIVE COCCI FEW GRAM POSITIVE RODS FEW WBC PRESENT, PREDOMINANTLY PMN  Culture MODERATE GRAM NEGATIVE RODS IDENTIFICATION  AND SUSCEPTIBILITIES TO FOLLOW CULTURE REINCUBATED FOR BETTER GROWTH Performed at [facility], 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  Diarrhea -Likely Antibiotic Mediated and improved -Check GI Pathogen Panel and if Negative will trail Imodium plan: GI pathogen panel # given that his diarrhea improved -Unlikely C Difficile and if not improving will consider obtaining C Difficile Testing  -Continue to Monitor and Trend  Hypoosmolar hypovolemic hyponatremia -Has suspected element of hypovolemia with suspected contribution from dehydration the sick clinical setting of sepsis and recent decline of oral intake Na+ Trend: Recent Labs  Lab 09/13/22 1733 09/14/22 0727 09/15/22 0229 09/16/22 0349 09/16/22 2202 09/17/22 0246 09/18/22 0258  NA 120* 130* 127* 130* 131* 128* 130*  -IVF now stopped -Replete with IV Sodium Phosphate 30 mmol yesterday  -Continue to Monitor and Trend and repeat CMP in the AM   Hypokalemia -Patient's K+ Level Trend: Recent Labs  Lab 09/13/22 1733 09/14/22 0727 09/15/22 0229 09/16/22 0349 09/16/22 2202 09/17/22 0246 09/18/22 0258  K 3.9 3.4* 3.8 3.5 3.3* 3.5 3.5  -Replete with IV Sodium Phos 30 mmol -Continue to Monitor and Replete as Necessary -Repeat CMP in the AM   Uncontrolled Diabetes Mellitus Type 2 -Include 70/30 insulin and is also on Mounjaro as an outpatient Last hemoglobin A1c was 14.3% and repeat this Visit is 9.6 -CBG and Glucose Trend: Recent Labs  Lab 09/13/22 1733 09/14/22 0727 09/15/22 0229 09/16/22 0349 09/16/22 2202 09/17/22 0246 09/18/22 0258  GLUCOSE 350* 194* 221* 163* 193* 160* 193*   Recent Labs  Lab 09/17/22 0845 09/17/22 1009  09/17/22 1144 09/17/22 1657 09/17/22 2348 09/18/22 0613 09/18/22 1206  GLUCAP 154* 173* 211* 208* 205* 170* 256*  -Continue with sensitive NovoLog/scale insulin AC for now -Consult diabetes education coordinator for further evaluation recommendations  HTN -Continue to Monitor BP per Protocol and Antihypertensives held due to Sever sepsis -Last BP reading was a little soft and is 107/60  AKI, improved Anion gap metabolic acidosis, improved  -BUN/Cr Trend: Recent Labs  Lab 09/13/22 1733 09/14/22 0727 09/15/22 0229 09/16/22 0349 09/16/22 2202 09/17/22 0246 09/18/22 0258  BUN 41* 31* 17 11 9 8 16   CREATININE 1.54* 0.88 0.71 0.80 0.78 0.72 0.82  -C/w IVF with NS at 75 mL/hr for 1 Day (was on LR at 75 mL/hr) -Anion gap metabolic acidosis improved and his CO2 is now 23, anion gap is 9, chloride level is 98 -Avoid Nephrotoxic Medications, Contrast Dyes, Hypotension and Dehydration to Ensure Adequate Renal Perfusion and will need to Renally Adjust Meds -Continue to Monitor and Trend Renal Function carefully and repeat CMP in the AM   Hypophosphatemia -Phos Level Trend: Recent Labs  Lab 09/15/22 0229 09/16/22 0349 09/17/22 0246 09/18/22 0258  PHOS 1.9* 2.7 2.0* 2.9  -Replete with IV Sodium Phos 30 mmol  -Continue to Monitor and Replete as Necessary -Repeat Phos Level in the AM  Dehydration -In setting of above and IVF now stopped  Normocytic Anemia -  Hgb/Hct Trend: Recent Labs  Lab 09/13/22 1733 09/14/22 0727 09/15/22 0229 09/16/22 0349 09/16/22 2202 09/17/22 0246 09/18/22 0258  HGB 11.2* 10.1* 9.2* 10.1* 8.6* 8.7* 8.1*  HCT 33.6* 30.3* 26.5* 31.0* 26.6* 26.1* 25.6*  MCV 80.6 76.7* 78.6* 80.7 78.7* 78.1* 80.3  -Checked Anemia Panel and it showed an iron level of 14, UIBC of 144, TIBC 158, saturation ratios of 9%, ferritin level of 1652, folate of 6.8, vitamin B12 level of 602 -Continue to Monitor for S/Sx of Bleeding; No overt Bleeding noted -Repeat CBC in the  AM  Hypoalbuminemia -Patient's Albumin Trend: Recent Labs  Lab 09/13/22 1733 09/14/22 0727 09/15/22 0229 09/16/22 0349 09/16/22 2202 09/17/22 0246 09/18/22 0258  ALBUMIN 2.2* 1.9* 1.5* 1.6* <1.5* <1.5* <1.5*  -Continue to Monitor and Trend and repeat CMP in the AM   DVT prophylaxis: SCD's [place]: 09/17/22 0927 SCDs [place]: 09/13/22 2009    Code Status: Full Code Family Communication: Discussed with the patient's mother at bedside  Disposition Plan:  Level of care: Telemetry Medical Status is: Inpatient Remains inpatient appropriate because: PT OT recommending CIR and may be a potential candidate.  ID to evaluate given his wound cultures   Consultants:  Orthopedic surgery Infectious diseases CIR  Procedures:  PROCEDURE:  AMPUTATION BELOW KNEE   Application of Kerecis micro graft 38 cm. Application 1 g vancomycin powder.   Application of Prevena customizable and Prevena arthroform wound VAC dressings. Application of Vive Wear stump shrinker and the Hanger limb protector   Tissue and purulence from the amputation site sent for culture  Antimicrobials:  Anti-infectives (From admission, onward)    [place]     Dose/Rate Route Frequency Ordered Stop   09/17/22 0821  vancomycin (VANCOCIN) powder  Status:  Discontinued          As needed 09/17/22 0822 09/17/22 0840   09/17/22 0640  ceFAZolin (ANCEF) 2-4 GM/100ML-% IVPB       Note to Pharmacy: Aquilla Hacker M: cabinet override      09/17/22 0640 09/17/22 0756   09/17/22 0600  ceFAZolin (ANCEF) IVPB 2g/100 mL premix        2 g 200 mL/hr over 30 Minutes Intravenous On call to O.R. 09/16/22 1944 09/17/22 0820   09/14/22 2200  vancomycin (VANCOREADY) IVPB 1250 mg/250 mL        1,250 mg 166.7 mL/hr over 90 Minutes Intravenous Every 24 hours 09/14/22 1054     09/14/22 1930  vancomycin (VANCOREADY) IVPB 1500 mg/300 mL  Status:  Discontinued        1,500 mg 150 mL/hr over 120 Minutes Intravenous Every 24 hours 09/13/22 2048  09/14/22 1054   09/14/22 1630  ceFEPIme (MAXIPIME) 2 g in sodium chloride 0.9 % 100 mL IVPB        2 g 200 mL/hr over 30 Minutes Intravenous Every 8 hours 09/14/22 1054     09/13/22 2030  ceFEPIme (MAXIPIME) 2 g in sodium chloride 0.9 % 100 mL IVPB  Status:  Discontinued        2 g 200 mL/hr over 30 Minutes Intravenous Every 12 hours 09/13/22 2015 09/14/22 1054   09/13/22 2030  metroNIDAZOLE (FLAGYL) IVPB 500 mg        500 mg 100 mL/hr over 60 Minutes Intravenous Every 12 hours 09/13/22 2015     09/13/22 1745  vancomycin (VANCOCIN) IVPB 1000 mg/200 mL premix  Status:  Discontinued        1,000 mg 200 mL/hr over 60 Minutes Intravenous  Once 09/13/22 1735 09/13/22 1742   09/13/22 1745  vancomycin (VANCOREADY) IVPB 1750 mg/350 mL        1,750 mg 175 mL/hr over 120 Minutes Intravenous  Once 09/13/22 1742 09/13/22 2105       Subjective: Seen and examined at bedside and states that he is having some pain today.  Described it as a 6 out of 10 in severity.  States his diarrhea has resolved now.  No other concerns or complaints this time.  Objective: Vitals:   09/18/22 0449 09/18/22 0611 09/18/22 0851 09/18/22 1413  BP: 103/71  104/65 107/60  Pulse: 72  70 68  Resp: 19  16 17   Temp: 97.7 F (36.5 C)  97.8 F (36.6 C) 97.6 F (36.4 C)  TempSrc: Oral     SpO2: 97%  98% 98%  Weight:  91.8 kg    Height:        Intake/Output Summary (Last 24 hours) at 09/18/2022 1742 Last data filed at 09/18/2022 1558 Gross per 24 hour  Intake --  Output 915 ml  Net -915 ml   Filed Weights   09/16/22 0500 09/17/22 0642 09/18/22 0611  Weight: 88.9 kg 88.9 kg 91.8 kg   Examination: Physical Exam:  Constitutional: WN/WD overweight chronically ill-appearing Caucasian male who is complaining of some pain Respiratory: Diminished to auscultation bilaterally, no wheezing, rales, rhonchi or crackles. Normal respiratory effort and patient is not tachypenic. No accessory muscle use.  Unlabored  breathing Cardiovascular: RRR, no murmurs / rubs / gallops. S1 and S2 auscultated.  Has left BKA now Abdomen: Soft, non-tender, Distended 2/2 body habitus. Bowel sounds positive.  GU: Deferred. Musculoskeletal: No clubbing / cyanosis of digits/nails.Left BKA Skin: No rashes, lesions, ulcers on a limited skin evaluation. No induration; Warm and dry.  Neurologic: CN 2-12 grossly intact with no focal deficits.  Romberg sign and cerebellar reflexes not assessed.  Psychiatric: Normal judgment and insight. Alert and oriented x 3. Normal mood and appropriate affect.   Data Reviewed: I have personally reviewed following labs and imaging studies  CBC: Recent Labs  Lab 09/15/22 0229 09/16/22 0349 09/16/22 2202 09/17/22 0246 09/18/22 0258  WBC 26.8* 27.5* 23.2* 25.3* 24.3*  NEUTROABS 23.3* 22.4* 18.9* 22.3* 19.2*  HGB 9.2* 10.1* 8.6* 8.7* 8.1*  HCT 26.5* 31.0* 26.6* 26.1* 25.6*  MCV 78.6* 80.7 78.7* 78.1* 80.3  PLT 503* 579* 611* 589* 588*   Basic Metabolic Panel: Recent Labs  Lab 09/14/22 0727 09/15/22 0229 09/16/22 0349 09/16/22 2202 09/17/22 0246 09/18/22 0258  NA 130* 127* 130* 131* 128* 130*  K 3.4* 3.8 3.5 3.3* 3.5 3.5  CL 94* 97* 98 95* 97* 98  CO2 24 23 23 23 23 23   GLUCOSE 194* 221* 163* 193* 160* 193*  BUN 31* 17 11 9 8 16   CREATININE 0.88 0.71 0.80 0.78 0.72 0.82  CALCIUM 9.1 8.3* 8.4* 8.3* 8.0* 8.0*  MG 2.4 2.2 2.1  --  2.0 2.1  PHOS  --  1.9* 2.7  --  2.0* 2.9   GFR: Estimated Creatinine Clearance: 104 mL/min (by C-G formula based on SCr of 0.82 mg/dL). Liver Function Tests: Recent Labs  Lab 09/15/22 0229 09/16/22 0349 09/16/22 2202 09/17/22 0246 09/18/22 0258  AST 19 19 19 17 25   ALT 18 14 14 13 14   ALKPHOS 111 120 103 104 95  BILITOT 0.5 0.8 0.5 0.8 0.3  PROT 5.9* 6.6 5.9* 6.0* 5.7*  ALBUMIN 1.5* 1.6* <1.5* <1.5* <1.5*   No results for input(s): "  LIPASE", "AMYLASE" in the last 168 hours. No results for input(s): "AMMONIA" in the last 168  hours. Coagulation Profile: Recent Labs  Lab 09/13/22 1849  INR 1.3*   Cardiac Enzymes: Recent Labs  Lab 09/14/22 0721  CKTOTAL 59   BNP (last 3 results) No results for input(s): "PROBNP" in the last 8760 hours. HbA1C: Recent Labs    09/16/22 1807  HGBA1C 9.8*   CBG: Recent Labs  Lab 09/17/22 1144 09/17/22 1657 09/17/22 2348 09/18/22 0613 09/18/22 1206  GLUCAP 211* 208* 205* 170* 256*   Lipid Profile: No results for input(s): "CHOL", "HDL", "LDLCALC", "TRIG", "CHOLHDL", "LDLDIRECT" in the last 72 hours. Thyroid Function Tests: No results for input(s): "TSH", "T4TOTAL", "FREET4", "T3FREE", "THYROIDAB" in the last 72 hours. Anemia Panel: Recent Labs    09/16/22 0349  VITAMINB12 602  FOLATE 6.8  FERRITIN 1,652*  TIBC 158*  IRON 14*  RETICCTPCT 1.5   Sepsis Labs: Recent Labs  Lab 09/13/22 1855 09/13/22 1932  LATICACIDVEN 2.8* 1.8    Recent Results (from the past 240 hour(s))  Blood Culture (routine x 2)     Status: Abnormal   Collection Time: 09/13/22  7:20 PM   Specimen: BLOOD LEFT FOREARM  Result Value Ref Range Status   Specimen Description BLOOD LEFT FOREARM  Final   Special Requests   Final    BOTTLES DRAWN AEROBIC AND ANAEROBIC Blood Culture adequate volume   Culture  Setup Time   Final    GRAM NEGATIVE RODS IN BOTH AEROBIC AND ANAEROBIC BOTTLES Organism ID to follow CRITICAL RESULT CALLED TO, READ BACK BY AND VERIFIED WITHNorva Riffle PHARMD AT 1153 09/14/22 D. Leighton Roach Performed at [facility], [address], [place], [place] [address]    Culture PROTEUS MIRABILIS (A)  Final   Report Status 09/16/2022 FINAL  Final   Organism ID, Bacteria PROTEUS MIRABILIS  Final      Susceptibility   Proteus mirabilis - MIC*    AMPICILLIN <=2 SENSITIVE Sensitive     CEFEPIME <=0.12 SENSITIVE Sensitive     CEFTAZIDIME <=1 SENSITIVE Sensitive     CEFTRIAXONE <=0.25 SENSITIVE Sensitive     CIPROFLOXACIN <=0.25 SENSITIVE Sensitive     GENTAMICIN  <=1 SENSITIVE Sensitive     IMIPENEM 4 SENSITIVE Sensitive     TRIMETH/SULFA <=20 SENSITIVE Sensitive     AMPICILLIN/SULBACTAM <=2 SENSITIVE Sensitive     PIP/TAZO <=4 SENSITIVE Sensitive     * PROTEUS MIRABILIS  Blood Culture ID Panel (Reflexed)     Status: Abnormal   Collection Time: 09/13/22  7:20 PM  Result Value Ref Range Status   Enterococcus faecalis NOT DETECTED NOT DETECTED Final   Enterococcus Faecium NOT DETECTED NOT DETECTED Final   Listeria monocytogenes NOT DETECTED NOT DETECTED Final   Staphylococcus species NOT DETECTED NOT DETECTED Final   Staphylococcus aureus (BCID) NOT DETECTED NOT DETECTED Final   Staphylococcus epidermidis NOT DETECTED NOT DETECTED Final   Staphylococcus lugdunensis NOT DETECTED NOT DETECTED Final   Streptococcus species NOT DETECTED NOT DETECTED Final   Streptococcus agalactiae NOT DETECTED NOT DETECTED Final   Streptococcus pneumoniae NOT DETECTED NOT DETECTED Final   Streptococcus pyogenes NOT DETECTED NOT DETECTED Final   A.calcoaceticus-baumannii NOT DETECTED NOT DETECTED Final   Bacteroides fragilis NOT DETECTED NOT DETECTED Final   Enterobacterales DETECTED (A) NOT DETECTED Final    Comment: Enterobacterales represent a large order of gram negative bacteria, not a single organism. CRITICAL RESULT CALLED TO, READ BACK BY AND VERIFIED  WITH: EGaye Pollack PHARMD AT 1153 09/14/22 D. VANHOOK    Enterobacter cloacae complex NOT DETECTED NOT DETECTED Final   Escherichia coli NOT DETECTED NOT DETECTED Final   Klebsiella aerogenes NOT DETECTED NOT DETECTED Final   Klebsiella oxytoca NOT DETECTED NOT DETECTED Final   Klebsiella pneumoniae NOT DETECTED NOT DETECTED Final   Proteus species DETECTED (A) NOT DETECTED Final    Comment: CRITICAL RESULT CALLED TO, READ BACK BY AND VERIFIED WITH: E. SINCLAIR PHARMD AT 1153 09/14/22 D. VANHOOK    Salmonella species NOT DETECTED NOT DETECTED Final   Serratia marcescens NOT DETECTED NOT DETECTED Final    Haemophilus influenzae NOT DETECTED NOT DETECTED Final   Neisseria meningitidis NOT DETECTED NOT DETECTED Final   Pseudomonas aeruginosa NOT DETECTED NOT DETECTED Final   Stenotrophomonas maltophilia NOT DETECTED NOT DETECTED Final   Candida albicans NOT DETECTED NOT DETECTED Final   Candida auris NOT DETECTED NOT DETECTED Final   Candida glabrata NOT DETECTED NOT DETECTED Final   Candida krusei NOT DETECTED NOT DETECTED Final   Candida parapsilosis NOT DETECTED NOT DETECTED Final   Candida tropicalis NOT DETECTED NOT DETECTED Final   Cryptococcus neoformans/gattii NOT DETECTED NOT DETECTED Final   CTX-M ESBL NOT DETECTED NOT DETECTED Final   Carbapenem resistance IMP NOT DETECTED NOT DETECTED Final   Carbapenem resistance KPC NOT DETECTED NOT DETECTED Final   Carbapenem resistance NDM NOT DETECTED NOT DETECTED Final   Carbapenem resist OXA 48 LIKE NOT DETECTED NOT DETECTED Final   Carbapenem resistance VIM NOT DETECTED NOT DETECTED Final    Comment: Performed at [facility], [address] [place], [place] [address]  Blood Culture (routine x 2)     Status: Abnormal   Collection Time: 09/13/22  7:31 PM   Specimen: BLOOD  Result Value Ref Range Status   Specimen Description BLOOD LEFT ANTECUBITAL  Final   Special Requests   Final    BOTTLES DRAWN AEROBIC AND ANAEROBIC Blood Culture results may not be optimal due to an excessive volume of blood received in culture bottles   Culture  Setup Time   Final    GRAM NEGATIVE RODS ANAEROBIC BOTTLE ONLY CRITICAL RESULT CALLED TO, READ BACK BY AND VERIFIED WITH: PHARMD LISA CURRAN ON 09/15/22 @ 1809 BY DRT    Culture (A)  Final    PREVOTELLA BIVIA BETA LACTAMASE POSITIVE Performed at [facility], [address] [place], [place] [address]    Report Status 09/17/2022 FINAL  Final  Surgical pcr screen     Status: None   Collection Time: 09/16/22  4:50 PM   Specimen: Nasal Mucosa; Nasal Swab  Result Value Ref Range Status    MRSA, PCR NEGATIVE NEGATIVE Final   Staphylococcus aureus NEGATIVE NEGATIVE Final    Comment: (NOTE) The Xpert SA Assay (FDA approved for NASAL specimens in patients 35 years of age and older), is one component of a comprehensive surveillance program. It is not intended to diagnose infection nor to guide or monitor treatment. Performed at [facility], [address], [place], [place] [address]   Aerobic/Anaerobic Culture w Gram Stain (surgical/deep wound)     Status: None (Preliminary result)   Collection Time: 09/17/22  8:48 AM   Specimen: Soft Tissue, Other  Result Value Ref Range Status   Specimen Description TISSUE  Final   Special Requests left leg wound  Final   Gram Stain   Final    ABUNDANT GRAM NEGATIVE RODS ABUNDANT  GRAM POSITIVE COCCI FEW GRAM POSITIVE RODS FEW WBC PRESENT, PREDOMINANTLY PMN    Culture   Final    MODERATE GRAM NEGATIVE RODS IDENTIFICATION AND SUSCEPTIBILITIES TO FOLLOW CULTURE REINCUBATED FOR BETTER GROWTH Performed at [facility], [address] [place], [place] [address]    Report Status PENDING  Incomplete    Radiology Studies: No results found.  Scheduled Meds:  vitamin C  1,000 mg Oral Daily   docusate sodium  100 mg Oral Daily   insulin aspart  0-9 Units Subcutaneous Q6H   nutrition supplement (JUVEN)  1 packet Oral BID BM   pantoprazole  40 mg Oral Daily   zinc sulfate  220 mg Oral Daily   Continuous Infusions:  sodium chloride     ceFEPime (MAXIPIME) IV 2 g (09/18/22 1557)   magnesium sulfate bolus IVPB     metronidazole 500 mg (09/18/22 0921)   vancomycin 1,250 mg (09/17/22 2206)    LOS: 5 days   Marguerita Merles, DO [facility] Available via Epic secure chat 7am-7pm After these hours, please refer to coverage provider listed on [email or web address] 09/18/2022, 5:42 PM

## 2022-09-18 NOTE — Care Management Important Message (Signed)
Important Message  Patient Details  Name: KENROY VICENCIO MRN: [id number] Date of Birth: 05-15-1955   Medicare Important Message Given:  Yes     Sherilyn Banker 09/18/2022, 1:24 PM

## 2022-09-18 NOTE — Progress Notes (Signed)
Inpatient Rehab Coordinator Note:  I met with patient at bedside to discuss CIR recommendations and goals/expectations of CIR stay.  We reviewed 3 hrs/day of therapy, physician follow up, and average length of stay 2 weeks (dependent upon progress) with goals of modified independence.  He states he does have some options for potential caregivers (a neighbor or his ex-wife) if he does not reach mod I level and he will call to speak with them today.  We discussed need for insurance prior auth and I will start that request pending updated OT note.    Estill Dooms, PT, DPT Admissions Coordinator [phone] 09/18/22  4:12 PM

## 2022-09-18 NOTE — Consult Note (Signed)
Physical Medicine and Rehabilitation Consult Reason for Consult:impaired mobility after right bka Referring Physician: Lajoyce Corners   HPI: Grant Yang is a 67 y.o. male with diabetes, left 2nd toe amp, and peripheral neuropathy who developed a necrotic ulceration of his left lateral foot which did not heal. Pt had balloon angioplasty of the LLE in June of this year.  He presented to ED on 09/04/22 with a worsening foot wound, septic. MRI done on 09/04/22 demonstrated bone marrow edema of the cuboid and base of the fifth metatarsal, and base of proximal phalanx of digit concerning for osteomyelitis. He was placed on broad spectrum antibiotics. 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 Plan is continue antibiotics for another 3-4 weeks given his bacteremia. Course also complicated by antibiotic related diarrhea, hyponatremia, hypokalemia, and uncontrolled diabetes. Pt was up with PT today and was mod assist for sit-std transfers and performed pivot transfer also. He did not ambulate. Prior to admission pt was independent and living on his own. He lives in [place] in a one level home with ramp to enter. Son lives in area, but he doesn't have anybody who lives close to him.    Review of Systems  Constitutional:  Negative for chills and fever.  HENT:  Negative for hearing loss.   Eyes: Negative.   Respiratory: Negative.    Cardiovascular: Negative.   Gastrointestinal: Negative.   Genitourinary: Negative.   Musculoskeletal:  Positive for joint pain and myalgias.  Skin:  Negative for rash.  Neurological:  Positive for sensory change and weakness.  Psychiatric/Behavioral:  Negative for depression.    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 [place] in 2010 . denies any residual deficits    Past Surgical History:  Procedure Laterality Date   ABDOMINAL AORTOGRAM W/LOWER EXTREMITY N/A 07/31/2022   Procedure: ABDOMINAL AORTOGRAM W/LOWER EXTREMITY;  Surgeon: Leonie Douglas, MD;  Location: [facility] INVASIVE CV LAB;  Service: Cardiovascular;  Laterality: N/A;   AMPUTATION Left 05/31/2022   Procedure: LEFT 2ND TOE AMPUTATION;  Surgeon: Nadara Mustard, MD;  Location: [facility] OR;  Service: Orthopedics;  Laterality: Left;   AMPUTATION Left 09/17/2022   Procedure: AMPUTATION BELOW KNEE;  Surgeon: Nadara Mustard, MD;  Location: [facility] OR;  Service: Orthopedics;  Laterality: Left;   APPENDECTOMY     APPLICATION OF WOUND VAC Left 09/17/2022   Procedure: APPLICATION OF WOUND VAC;  Surgeon: Nadara Mustard, MD;  Location: [facility] OR;  Service: Orthopedics;  Laterality: Left;   CHOLECYSTECTOMY  12/30/2010   Procedure: LAPAROSCOPIC CHOLECYSTECTOMY;  Surgeon: Dalia Heading;  Location: [facility] ORS;  Service: General;  Laterality: N/A;   I & D EXTREMITY Left 05/31/2022   Procedure: LEFT FOOT DEBRIDEMENT;  Surgeon: Nadara Mustard, MD;  Location: [facility] OR;  Service: Orthopedics;  Laterality: Left;   INCISION AND DRAINAGE ABSCESS Left 02/01/2017   Procedure: INCISION AND DRAINAGE ABSCESS LEFT HAND;  Surgeon: Vickki Hearing, MD;  Location: [facility] ORS;  Service: Orthopedics;  Laterality: Left;   LOWER EXTREMITY ANGIOGRAPHY N/A 08/03/2022   Procedure: Lower Extremity Angiography;  Surgeon: Cephus Shelling, MD;  Location: [facility] INVASIVE CV LAB;  Service: Cardiovascular;  Laterality: N/A;   LYMPHADENECTOMY Bilateral 08/02/2017   Procedure: LYMPHADENECTOMY, PELVIC;  Surgeon: Heloise Purpura, MD;  Location: [facility] ORS;  Service: Urology;  Laterality: Bilateral;   PERIPHERAL VASCULAR BALLOON  ANGIOPLASTY  08/03/2022   Procedure: PERIPHERAL VASCULAR BALLOON ANGIOPLASTY;  Surgeon: Cephus Shelling, MD;  Location: [facility] INVASIVE CV LAB;  Service: Cardiovascular;;   ROBOT ASSISTED LAPAROSCOPIC RADICAL PROSTATECTOMY N/A 08/02/2017   Procedure: XI ROBOTIC ASSISTED LAPAROSCOPIC RADICAL PROSTATECTOMY LEVEL 2;  Surgeon: Heloise Purpura, MD;  Location: [facility] ORS;  Service: Urology;  Laterality: N/A;   Family History  Problem Relation Age of Onset   Heart failure Mother    Cancer Father    Social History:  reports that he quit smoking about 14 years ago. His smoking use included cigarettes. He started smoking about 52 years ago. He has a 76 pack-year smoking history. He has never used smokeless tobacco. He reports current drug use. Drug: Marijuana. He reports that he does not drink alcohol. Allergies: No Known Allergies Medications Prior to Admission  Medication Sig Dispense Refill   aspirin EC 81 MG tablet Take 1 tablet (81 mg total) by mouth daily. Swallow whole. 30 tablet 12   blood glucose meter kit and supplies Dispense based on patient and insurance preference. Use up to four times daily as directed. (FOR ICD-10 E10.9, E11.9). 1 each 0   clopidogrel (PLAVIX) 75 MG tablet Take 1 tablet (75 mg total) by mouth daily with breakfast. 30 tablet 3   Continuous Blood Gluc Sensor (DEXCOM G7 SENSOR) MISC 1 Units by Does not apply route See admin instructions. Change sensor after 10 days 3 each 2   EPINEPHrine 0.3 mg/0.3 mL IJ SOAJ injection Inject 0.3 mg into the muscle once as needed for up to 1 dose (if patient exhibits significant signs and symptoms of allergic reaction.). 1 each 0   gabapentin (NEURONTIN) 300 MG capsule Take 1 capsule (300 mg total) by mouth 3 (three) times daily as needed. (Patient taking differently: Take 300 mg by mouth 3 (three) times daily as needed (Neuropathy).) 180 capsule 1   ibuprofen (ADVIL) 200 MG tablet Take 400 mg by mouth 2 (two) times daily as needed for headache or  moderate pain.     insulin NPH-regular Human (NOVOLIN 70/30) (70-30) 100 UNIT/ML injection Inject 10 Units into the skin 2 (two) times daily with a meal. (Patient taking differently: Inject 3 Units into the skin 3 (three) times daily with meals.) 10 mL 1   Insulin Syringes, Disposable, U-100 0.3 ML MISC 10 Units by Does not apply route 2 (two) times daily with breakfast and lunch. 60 each 0   olmesartan (BENICAR) 20 MG tablet Take 1 tablet (20 mg total) by mouth daily. 30 tablet 2   tirzepatide (MOUNJARO) 2.5 MG/0.5ML Pen Inject 2.5 mg into the skin once a week. 2 mL 0   albuterol (VENTOLIN HFA) 108 (90 Base) MCG/ACT inhaler Inhale 2 puffs into the lungs every 6 (six) hours as needed for wheezing or shortness of breath. (Patient not taking: Reported on 09/13/2022) 8 g 0   atorvastatin (LIPITOR) 40 MG tablet Take 1 tablet (40 mg total) by mouth daily. (Patient not taking: Reported on 09/13/2022) 90 tablet 3   metFORMIN (GLUCOPHAGE-XR) 500 MG 24 hr tablet Take 500  mg by mouth once daily x1 week, then increase to 500 mg twice daily x1 week, then 1,000 mg in the AM and 500 mg in the PM x1 week, and finally 1,000 mg twice daily. Take with food. (Patient not taking: Reported on 09/13/2022) 90 tablet 1    Home: Home Living Family/patient expects to be discharged to:: Private residence Living Arrangements: Alone Available Help at Discharge: Friend(s), Family, Available PRN/intermittently Type of Home: House Home Access: Ramped entrance Home Layout: One level Bathroom Shower/Tub: Engineer, manufacturing systems: Standard Bathroom Accessibility: Yes Home Equipment: Agricultural consultant (2 wheels) (Reports son is looking into getting him a wheelchair) Additional Comments: Reports his son and daughter are local and have told him they would help out  Functional History: Prior Function Prior Level of Function : Independent/Modified Independent, Driving Mobility Comments: Independent ADLs Comments: Currently  takes standing showers in the tub/shower combo Functional Status:  Mobility: Bed Mobility Overal bed mobility: Needs Assistance Bed Mobility: Supine to Sit Supine to sit: Min guard General bed mobility comments: Cues for technique; assisted with VAC line management Transfers Overall transfer level: Needs assistance Equipment used: Rolling walker (2 wheels) Transfers: Sit to/from Stand, Bed to chair/wheelchair/BSC Sit to Stand: Mod assist Bed to/from chair/wheelchair/BSC transfer type:: Step pivot Step pivot transfers: Min assist General transfer comment: Light mod assist for power up to single limb stance on RLE; will need reinforcement of hand placement and safety; Able to take 1-2 full steps on RLE, with noted mild loss of balance at R foot fall; Able to perform hell-toe pivot on R foot with demo cues      ADL:    Cognition: Cognition Overall Cognitive Status: Within Functional Limits for tasks assessed Orientation Level: Oriented X4 Cognition Arousal/Alertness: Awake/alert Behavior During Therapy: WFL for tasks assessed/performed Overall Cognitive Status: Within Functional Limits for tasks assessed  Blood pressure 104/65, pulse 70, temperature 97.8 F (36.6 C), resp. rate 16, height 5' 10.98" (1.803 m), weight 91.8 kg, SpO2 98%. Physical Exam Constitutional:      General: He is not in acute distress.    Appearance: He is normal weight. He is not ill-appearing.  HENT:     Head: Normocephalic.     Nose: Nose normal.     Mouth/Throat:     Pharynx: Oropharynx is clear.  Eyes:     Pupils: Pupils are equal, round, and reactive to light.  Cardiovascular:     Rate and Rhythm: Normal rate.  Pulmonary:     Effort: Pulmonary effort is normal.  Abdominal:     Palpations: Abdomen is soft.  Musculoskeletal:        General: Swelling and tenderness present.     Cervical back: Normal range of motion.  Skin:    Comments: Right leg/foot is intact. Left leg in limb guard/vac   Neurological:     Comments: Alert and oriented x 3. Normal insight and awareness. Intact Memory. Normal language and speech. Cranial nerve exam unremarkable. MMT: 5/5 UE's. RLE 4/5. LLE 3+/5 HF. Pt with decreased LT below ankle RLE. Finger tips numb also.    Psychiatric:        Mood and Affect: Mood normal.        Behavior: Behavior normal.     Results for orders placed or performed during the hospital encounter of 09/13/22 (from the past 24 hour(s))  Glucose, capillary     Status: Abnormal   Collection Time: 09/17/22  4:57 PM  Result Value Ref Range  Glucose-Capillary 208 (H) 70 - 99 mg/dL  Glucose, capillary     Status: Abnormal   Collection Time: 09/17/22 11:48 PM  Result Value Ref Range   Glucose-Capillary 205 (H) 70 - 99 mg/dL  CBC with Differential/Platelet     Status: Abnormal   Collection Time: 09/18/22  2:58 AM  Result Value Ref Range   WBC 24.3 (H) 4.0 - 10.5 K/uL   RBC 3.19 (L) 4.22 - 5.81 MIL/uL   Hemoglobin 8.1 (L) 13.0 - 17.0 g/dL   HCT 82.9 (L) 56.2 - 13.0 %   MCV 80.3 80.0 - 100.0 fL   MCH 25.4 (L) 26.0 - 34.0 pg   MCHC 31.6 30.0 - 36.0 g/dL   RDW 86.5 78.4 - 69.6 %   Platelets 588 (H) 150 - 400 K/uL   nRBC 0.0 0.0 - 0.2 %   Neutrophils Relative % 79 %   Neutro Abs 19.2 (H) 1.7 - 7.7 K/uL   Lymphocytes Relative 11 %   Lymphs Abs 2.6 0.7 - 4.0 K/uL   Monocytes Relative 6 %   Monocytes Absolute 1.4 (H) 0.1 - 1.0 K/uL   Eosinophils Relative 0 %   Eosinophils Absolute 0.1 0.0 - 0.5 K/uL   Basophils Relative 0 %   Basophils Absolute 0.1 0.0 - 0.1 K/uL   Immature Granulocytes 4 %   Abs Immature Granulocytes 0.97 (H) 0.00 - 0.07 K/uL  Comprehensive metabolic panel     Status: Abnormal   Collection Time: 09/18/22  2:58 AM  Result Value Ref Range   Sodium 130 (L) 135 - 145 mmol/L   Potassium 3.5 3.5 - 5.1 mmol/L   Chloride 98 98 - 111 mmol/L   CO2 23 22 - 32 mmol/L   Glucose, Bld 193 (H) 70 - 99 mg/dL   BUN 16 8 - 23 mg/dL   Creatinine, Ser 2.95 0.61 -  1.24 mg/dL   Calcium 8.0 (L) 8.9 - 10.3 mg/dL   Total Protein 5.7 (L) 6.5 - 8.1 g/dL   Albumin <2.8 (L) 3.5 - 5.0 g/dL   AST 25 15 - 41 U/L   ALT 14 0 - 44 U/L   Alkaline Phosphatase 95 38 - 126 U/L   Total Bilirubin 0.3 0.3 - 1.2 mg/dL   GFR, Estimated >41 >32 mL/min   Anion gap 9 5 - 15  Phosphorus     Status: None   Collection Time: 09/18/22  2:58 AM  Result Value Ref Range   Phosphorus 2.9 2.5 - 4.6 mg/dL  Magnesium     Status: None   Collection Time: 09/18/22  2:58 AM  Result Value Ref Range   Magnesium 2.1 1.7 - 2.4 mg/dL  Glucose, capillary     Status: Abnormal   Collection Time: 09/18/22  6:13 AM  Result Value Ref Range   Glucose-Capillary 170 (H) 70 - 99 mg/dL  Glucose, capillary     Status: Abnormal   Collection Time: 09/18/22 12:06 PM  Result Value Ref Range   Glucose-Capillary 256 (H) 70 - 99 mg/dL   Comment 1 Notify RN    No results found.  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 Does the need for close, 24 hr/day medical supervision in concert with the patient's rehab needs make it unreasonable for this patient to be served in a less intensive setting? Yes Co-Morbidities requiring supervision/potential complications:  -septicemia requiring ongoing IV antibiotics -post-op pain management -post-op wound  care -prosthetic preparation and education -hyponatremia -poorly controlled diabetes Due to bladder management, bowel management, safety, skin/wound care, disease management, medication administration, pain management, and patient education, does the patient require 24 hr/day rehab nursing? Yes Does the patient require coordinated care of a physician, rehab nurse, therapy disciplines of PT, OT to address physical and functional deficits in the context of the above medical diagnosis(es)? Yes Addressing deficits in the following areas: balance, endurance, locomotion, strength,  transferring, bowel/bladder control, bathing, dressing, feeding, grooming, toileting, and psychosocial support Can the patient actively participate in an intensive therapy program of at least 3 hrs of therapy per day at least 5 days per week? Yes The potential for patient to make measurable gains while on inpatient rehab is excellent Anticipated functional outcomes upon discharge from inpatient rehab are modified independent  with PT, modified independent with OT, n/a with SLP. Estimated rehab length of stay to reach the above functional goals is: 10 days Anticipated discharge destination: Home Overall Rehab/Functional Prognosis: excellent  POST ACUTE RECOMMENDATIONS: This patient's condition is appropriate for continued rehabilitative care in the following setting: CIR Patient has agreed to participate in recommended program. Yes Note that insurance prior authorization may be required for reimbursement for recommended care.  Comment: Pt needs to be modified independent at a wheelchair level to go home. Son built him a ramp at his home. Son is able to check in on him. The patient is motivated to regain his independence!. Rehab Admissions Coordinator to follow up      I have personally performed a face to face diagnostic evaluation of this patient. Additionally, I have examined the patient's medical record including any pertinent labs and radiographic images. If the physician assistant has documented in this note, I have reviewed and edited or otherwise concur with the physician assistant's documentation.  Thanks,  Ranelle Oyster, MD 09/18/2022

## 2022-09-18 NOTE — Evaluation (Signed)
Physical Therapy Evaluation Patient Details Name: Grant Yang MRN: [id number] DOB: 02-Sep-1955 Today's Date: 09/18/2022  History of Present Illness  Admitted with L LE infection necesitating transtibial amputation;  has a past medical history of Cancer (HCC), Diabetes mellitus, GERD (gastroesophageal reflux disease), History of kidney stones, Hypertension, Neuropathy in diabetes (HCC), Shortness of breath, and Stroke (HCC) (2010).  Clinical Impression   Pt admitted with above diagnosis. Lives at home alone, in a single-level home with a ramped entrance; Prior to admission, pt independent, and he is already showing good problem-solving and planning for getting home after this L transtibial amputation; Reports his lacal kids can provide some assist; Presents to PT with generalized weakness, strength and ROM deficits LLE post amputation, decr functional balance, decr functional mobility; Needing minguard assist to get up to EOB, Mod assist to power up to RLE single limb stance, and min assist for balance with small steps RLE with RW; Anticipate good functional progress; He must be at independent functional level to manage safely at home, and it is worth considering an intensive post-acute rehab stay to maximize independence and safety with mobility and ADLs; Patient will benefit from intensive inpatient follow up therapy, >3 hours/day;  Pt currently with functional limitations due to the deficits listed below (see PT Problem List). Pt will benefit from skilled PT to increase their independence and safety with mobility to allow discharge to the venue listed below.           If plan is discharge home, recommend the following: A little help with walking and/or transfers;A little help with bathing/dressing/bathroom;Assistance with cooking/housework;Assist for transportation   Can travel by private vehicle        Equipment Recommendations BSC/3in1;Wheelchair (measurements PT);Wheelchair cushion  (measurements PT)  Recommendations for Other Services  Rehab consult (for consideration of AIR)    Functional Status Assessment Patient has had a recent decline in their functional status and demonstrates the ability to make significant improvements in function in a reasonable and predictable amount of time.     Precautions / Restrictions Precautions Precautions: Fall;Other (comment) Precaution Comments: Enteric Precautions Restrictions Weight Bearing Restrictions: No      Mobility  Bed Mobility Overal bed mobility: Needs Assistance Bed Mobility: Supine to Sit     Supine to sit: Min guard     General bed mobility comments: Cues for technique; assisted with VAC line management    Transfers Overall transfer level: Needs assistance Equipment used: Rolling walker (2 wheels) Transfers: Sit to/from Stand, Bed to chair/wheelchair/BSC Sit to Stand: Mod assist   Step pivot transfers: Min assist       General transfer comment: Light mod assist for power up to single limb stance on RLE; will need reinforcement of hand placement and safety; Able to take 1-2 full steps on RLE, with noted mild loss of balance at R foot fall; Able to perform hell-toe pivot on R foot with demo cues    Ambulation/Gait                  Stairs            Wheelchair Mobility     Tilt Bed    Modified Rankin (Stroke Patients Only)       Balance  Pertinent Vitals/Pain Pain Assessment Pain Assessment: Faces Faces Pain Scale: Hurts a little bit Pain Location: LLE at surgical site Pain Descriptors / Indicators: Burning Pain Intervention(s): Monitored during session    Home Living Family/patient expects to be discharged to:: Private residence Living Arrangements: Alone Available Help at Discharge: Friend(s);Family;Available PRN/intermittently Type of Home: House Home Access: Ramped entrance       Home Layout:  One level Home Equipment: Agricultural consultant (2 wheels) (Reports son is looking into getting him a wheelchair) Additional Comments: Reports his son and daughter are local and have told him they would help out    Prior Function Prior Level of Function : Independent/Modified Independent;Driving             Mobility Comments: Independent ADLs Comments: Currently takes standing showers in the tub/shower combo     Hand Dominance   Dominant Hand: Right    Extremity/Trunk Assessment   Upper Extremity Assessment Upper Extremity Assessment: Defer to OT evaluation    Lower Extremity Assessment Lower Extremity Assessment: Generalized weakness;LLE deficits/detail LLE Deficits / Details: L hip and knee flexion WFL; showing good quad contraction, and good gluteal recruitment with LLE bolstered bridging       Communication   Communication: No difficulties  Cognition Arousal/Alertness: Awake/alert Behavior During Therapy: WFL for tasks assessed/performed Overall Cognitive Status: Within Functional Limits for tasks assessed                                          General Comments General comments (skin integrity, edema, etc.): Provided education on therex to prepare fo ra prosthesis    Exercises Amputee Exercises Quad Sets: AROM, Left, 5 reps Hip Extension: Standing, Left, 5 reps, AROM Hip Flexion/Marching: Left, 5 reps, Supine Knee Flexion: Left, 5 reps, Supine Straight Leg Raises: Left, 5 reps, Supine Other Exercises Other Exercises: Bolstered LLE half-bridging x5   Assessment/Plan    PT Assessment Patient needs continued PT services  PT Problem List Decreased strength;Decreased range of motion;Decreased activity tolerance;Decreased balance;Decreased mobility;Decreased coordination;Decreased knowledge of use of DME;Decreased safety awareness;Decreased knowledge of precautions;Pain;Impaired sensation       PT Treatment Interventions DME instruction;Gait  training;Stair training;Functional mobility training;Therapeutic activities;Therapeutic exercise;Balance training;Neuromuscular re-education;Cognitive remediation;Patient/family education;Wheelchair mobility training    PT Goals (Current goals can be found in the Care Plan section)  Acute Rehab PT Goals Patient Stated Goal: Wants to get a prosthesis PT Goal Formulation: With patient Time For Goal Achievement: 10/02/22 Potential to Achieve Goals: Good Additional Goals Additional Goal #1: Pt will deomonstrate proficiency in wheelchair propulsion and parts management with modified independence    Frequency Min 1X/week     Co-evaluation               AM-PAC PT "6 Clicks" Mobility  Outcome Measure Help needed turning from your back to your side while in a flat bed without using bedrails?: None Help needed moving from lying on your back to sitting on the side of a flat bed without using bedrails?: A Little Help needed moving to and from a bed to a chair (including a wheelchair)?: A Little Help needed standing up from a chair using your arms (e.g., wheelchair or bedside chair)?: A Little Help needed to walk in hospital room?: A Lot Help needed climbing 3-5 steps with a railing? : Total 6 Click Score: 16    End of Session Equipment Utilized During Treatment:  Gait belt Activity Tolerance: Patient tolerated treatment well Patient left: in chair;with call bell/phone within reach;with chair alarm set Nurse Communication: Mobility status PT Visit Diagnosis: Unsteadiness on feet (R26.81);Other abnormalities of gait and mobility (R26.89)    Time: 6644-0347 PT Time Calculation (min) (ACUTE ONLY): 34 min   Charges:   PT Evaluation $PT Eval Moderate Complexity: 1 Mod PT Treatments $Therapeutic Exercise: 8-22 mins PT General Charges $$ ACUTE PT VISIT: 1 Visit         Van Clines, PT  Acute Rehabilitation Services Office [phone] Secure Chat welcomed   Grant Yang 09/18/2022, 10:43 AM

## 2022-09-18 NOTE — Plan of Care (Signed)
  Problem: Coping: Goal: Ability to adjust to condition or change in health will improve Outcome: Progressing   Problem: Fluid Volume: Goal: Ability to maintain a balanced intake and output will improve Outcome: Progressing   

## 2022-09-18 NOTE — Consult Note (Signed)
[facility]                                                                                        Patient Identification: Patient Name: Grant Yang MRN: [id number] Admit Date: 09/13/2022  5:06 PM Today's Date: 09/18/2022 Reason for consult: Positive wound cultures  Requesting provider: Dr Marland Mcalpine  Principal Problem:   Osteomyelitis of left lower extremity [facility]) Active Problems:   Acute hyponatremia   Essential hypertension   Severe sepsis (HCC)   Penetrating foot wound, left, initial encounter   AKI (acute kidney injury) (HCC)   High anion gap metabolic acidosis   Dehydration   Acute prerenal azotemia   DM2 (diabetes mellitus, type 2) (HCC)   Severe protein-calorie malnutrition (HCC)   Antibiotics:  Vancomycin, cefepime and metronidazole 7/24-c  Lines/Hardware:  Assessment  # Left foot DFU/abscess/osteomyelitis  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  Patient had purulence that extended up to the amputation site.  The muscle was healthy and viable no necrotic tissue or fascia. There is no infection proximal to the amputation site Tissue and purulence from the amputation site sent for cultures. Cx GNR. Per Dr Lajoyce Corners, no concerns for remaining osteomyelitis   # Prevotella bivia/Proteus mirbailis bacteremia 2/2 above   Recommendations  DC Vancomycin, continue cefepime and metronidazole until GNR from OR cx Id'ed. Will discard wound cx from 7/15 as patient already had BKA Fu final OR cx for final abtx recs, expecting PO if compatible  Monitor CBC and CMP on abtx  Rest of the management as per the primary team. Please call with questions or concerns.  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 Patient was originally seen at [facility] ED 7/15 for worsening left foot wound. MRI concerning for Large skin wound about the lateral aspect of the foot extending from the cuboid to the base of the fifth metatarsal with peripherally enhancing fluid collection measuring at least 1.7 x 1.2 x 5.7 cm concerning for abscess.Bone marrow edema of the cuboid, base of the fifth metatarsal, base of the proximal phalanx of the fifth digit concerning for osteomyelitis. Patient advised admisison but left AMA and presented to [facility] on 7/24 with worsening symptoms in left foot. Reported subjective fevers with poor po appetite  but no chills and rigors.  He also had diarrhea prior to admission but has resolved after his BKA.  reports she was treated by a 10-day course of an antibiotic for 10 days in May without any improvement but not taking p.o. antibiotics prior to this admission.  He lives alone, denies smoking alcohol and IVDU.   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  Patient had purulence that extended up to the amputation  site.  The muscle was healthy and viable no necrotic tissue or fascia. There is no infection proximal to the amputation site Tissue and purulence from the amputation site sent for cultures.   ROS: General- Denies loss of appetite and loss of weight HEENT - Denies headache, blurry vision, neck pain, sinus pain Chest - Denies any chest pain, SOB or cough CVS- Denies any dizziness/lightheadedness, syncopal attacks, palpitations Abdomen- Denies any nausea, vomiting, abdominal pain, hematochezia Neuro - Denies any weakness, numbness,  tingling sensation Psych - Denies any changes in mood irritability or depressive symptoms GU- Denies any burning, dysuria, hematuria or increased frequency of urination Skin - denies any rashes/lesions MSK - denies any joint pain/swelling or restricted ROM   Past Medical History:  Diagnosis Date   Cancer (HCC)    prostate cancer    Diabetes mellitus    GERD (gastroesophageal reflux disease)    History of kidney stones    Hypertension    Neuropathy in diabetes (HCC)    bilateral lower extremities    Shortness of breath    patient states it hurts to breath; this was when he use to smoke cigarettes    Stroke [facility]) 2010   "mini stroke" patient cant remember when; states it happened in w. [place] in 2010 . denies any residual deficits    Past Surgical History:  Procedure Laterality Date   ABDOMINAL AORTOGRAM W/LOWER EXTREMITY N/A 07/31/2022   Procedure: ABDOMINAL AORTOGRAM W/LOWER EXTREMITY;  Surgeon: Leonie Douglas, MD;  Location: [facility] INVASIVE CV LAB;  Service: Cardiovascular;  Laterality: N/A;   AMPUTATION Left 05/31/2022   Procedure: LEFT 2ND TOE AMPUTATION;  Surgeon: Nadara Mustard, MD;  Location: [facility] OR;  Service: Orthopedics;  Laterality: Left;   AMPUTATION Left 09/17/2022   Procedure: AMPUTATION BELOW KNEE;  Surgeon: Nadara Mustard, MD;  Location: [facility] OR;  Service: Orthopedics;  Laterality: Left;   APPENDECTOMY     APPLICATION OF WOUND VAC Left 09/17/2022   Procedure: APPLICATION OF WOUND VAC;  Surgeon: Nadara Mustard, MD;  Location: [facility] OR;  Service: Orthopedics;  Laterality: Left;   CHOLECYSTECTOMY  12/30/2010   Procedure: LAPAROSCOPIC CHOLECYSTECTOMY;  Surgeon: Dalia Heading;  Location: [facility] ORS;  Service: General;  Laterality: N/A;   I & D EXTREMITY Left 05/31/2022   Procedure: LEFT FOOT DEBRIDEMENT;  Surgeon: Nadara Mustard, MD;  Location: [facility] OR;  Service: Orthopedics;  Laterality: Left;   INCISION AND DRAINAGE ABSCESS Left 02/01/2017   Procedure: INCISION AND DRAINAGE ABSCESS  LEFT HAND;  Surgeon: Vickki Hearing, MD;  Location: [facility] ORS;  Service: Orthopedics;  Laterality: Left;   LOWER EXTREMITY ANGIOGRAPHY N/A 08/03/2022   Procedure: Lower Extremity Angiography;  Surgeon: Cephus Shelling, MD;  Location: [facility] INVASIVE CV LAB;  Service: Cardiovascular;  Laterality: N/A;   LYMPHADENECTOMY Bilateral 08/02/2017   Procedure: LYMPHADENECTOMY, PELVIC;  Surgeon: Heloise Purpura, MD;  Location: [facility] ORS;  Service: Urology;  Laterality: Bilateral;   PERIPHERAL VASCULAR BALLOON ANGIOPLASTY  08/03/2022   Procedure: PERIPHERAL VASCULAR BALLOON ANGIOPLASTY;  Surgeon: Cephus Shelling, MD;  Location: [facility] INVASIVE CV LAB;  Service: Cardiovascular;;   ROBOT ASSISTED LAPAROSCOPIC RADICAL PROSTATECTOMY N/A 08/02/2017   Procedure: XI ROBOTIC ASSISTED LAPAROSCOPIC RADICAL PROSTATECTOMY LEVEL 2;  Surgeon: Heloise Purpura, MD;  Location: [facility] ORS;  Service: Urology;  Laterality: N/A;     Scheduled Meds:  vitamin C  1,000 mg Oral Daily   docusate sodium  100 mg Oral Daily   insulin aspart  0-9 Units  Subcutaneous Q6H   nutrition supplement (JUVEN)  1 packet Oral BID BM   pantoprazole  40 mg Oral Daily   zinc sulfate  220 mg Oral Daily   Continuous Infusions:  sodium chloride     ceFEPime (MAXIPIME) IV 2 g (09/18/22 1557)   magnesium sulfate bolus IVPB     metronidazole 500 mg (09/18/22 0921)   vancomycin 1,250 mg (09/17/22 2206)   PRN Meds:.acetaminophen **OR** acetaminophen, alum & mag hydroxide-simeth, bisacodyl, fentaNYL (SUBLIMAZE) injection, guaiFENesin-dextromethorphan, hydrALAZINE, HYDROmorphone (DILAUDID) injection, labetalol, magnesium citrate, magnesium sulfate bolus IVPB, melatonin, metoprolol tartrate, naLOXone (NARCAN)  injection, ondansetron, oxyCODONE, oxyCODONE, oxyCODONE, phenol, polyethylene glycol, potassium chloride  No Known Allergies  Social History   Socioeconomic History   Marital status: Divorced    Spouse name: Not on file   Number of children: Not on  file   Years of education: Not on file   Highest education level: Not on file  Occupational History   Not on file  Tobacco Use   Smoking status: Former    Current packs/day: 0.00    Average packs/day: 2.0 packs/day for 38.0 years (76.0 ttl pk-yrs)    Types: Cigarettes    Start date: 2    Quit date: 2010    Years since quitting: 14.5   Smokeless tobacco: Never  Substance and Sexual Activity   Alcohol use: No   Drug use: Yes    Types: Marijuana    Comment: occ   Sexual activity: Yes  Other Topics Concern   Not on file  Social History Narrative   Not on file   Social Determinants of Health   Financial Resource Strain: Not on file  Food Insecurity: No Food Insecurity (07/30/2022)   Hunger Vital Sign    Worried About Running Out of Food in the Last Year: Never true    Ran Out of Food in the Last Year: Never true  Transportation Needs: No Transportation Needs (07/30/2022)   PRAPARE - Administrator, Civil Service (Medical): No    Lack of Transportation (Non-Medical): No  Physical Activity: Not on file  Stress: Not on file  Social Connections: Not on file  Intimate Partner Violence: Not At Risk (07/30/2022)   Humiliation, Afraid, Rape, and Kick questionnaire    Fear of Current or Ex-Partner: No    Emotionally Abused: No    Physically Abused: No    Sexually Abused: No   Family History  Problem Relation Age of Onset   Heart failure Mother    Cancer Father     Vitals BP 107/60 (BP Location: Left Arm)   Pulse 68   Temp 97.6 F (36.4 C)   Resp 17   Ht 5' 10.98" (1.803 m)   Wt 91.8 kg   SpO2 98%   BMI 28.24 kg/m    Physical Exam Constitutional: Adult male sitting in the bed and appears comfortable    Comments:   Cardiovascular:     Rate and Rhythm: Normal rate and regular rhythm.     Heart sounds: S1 and S2  Pulmonary:     Effort: Pulmonary effort is normal.     Comments: Normal breath sounds  Abdominal:     Palpations: Abdomen is soft.      Tenderness: Nondistended and nontender  Musculoskeletal:        General: No swelling or tenderness in peripheral joints.  Left BKA site is bandaged C/D/I  Skin:    Comments: No rashes  Neurological: Awake, alert and  oriented, following commands  Psychiatric:        Mood and Affect: Mood normal.   Pertinent Microbiology Results for orders placed or performed during the hospital encounter of 09/13/22  Blood Culture (routine x 2)     Status: Abnormal   Collection Time: 09/13/22  7:20 PM   Specimen: BLOOD LEFT FOREARM  Result Value Ref Range Status   Specimen Description BLOOD LEFT FOREARM  Final   Special Requests   Final    BOTTLES DRAWN AEROBIC AND ANAEROBIC Blood Culture adequate volume   Culture  Setup Time   Final    GRAM NEGATIVE RODS IN BOTH AEROBIC AND ANAEROBIC BOTTLES Organism ID to follow CRITICAL RESULT CALLED TO, READ BACK BY AND VERIFIED WITHNorva Riffle PHARMD AT 1153 09/14/22 D. Leighton Roach Performed at [facility], [address] [place], [place] [address]    Culture PROTEUS MIRABILIS (A)  Final   Report Status 09/16/2022 FINAL  Final   Organism ID, Bacteria PROTEUS MIRABILIS  Final      Susceptibility   Proteus mirabilis - MIC*    AMPICILLIN <=2 SENSITIVE Sensitive     CEFEPIME <=0.12 SENSITIVE Sensitive     CEFTAZIDIME <=1 SENSITIVE Sensitive     CEFTRIAXONE <=0.25 SENSITIVE Sensitive     CIPROFLOXACIN <=0.25 SENSITIVE Sensitive     GENTAMICIN <=1 SENSITIVE Sensitive     IMIPENEM 4 SENSITIVE Sensitive     TRIMETH/SULFA <=20 SENSITIVE Sensitive     AMPICILLIN/SULBACTAM <=2 SENSITIVE Sensitive     PIP/TAZO <=4 SENSITIVE Sensitive     * PROTEUS MIRABILIS  Blood Culture ID Panel (Reflexed)     Status: Abnormal   Collection Time: 09/13/22  7:20 PM  Result Value Ref Range Status   Enterococcus faecalis NOT DETECTED NOT DETECTED Final   Enterococcus Faecium NOT DETECTED NOT DETECTED Final   Listeria monocytogenes NOT DETECTED NOT DETECTED Final    Staphylococcus species NOT DETECTED NOT DETECTED Final   Staphylococcus aureus (BCID) NOT DETECTED NOT DETECTED Final   Staphylococcus epidermidis NOT DETECTED NOT DETECTED Final   Staphylococcus lugdunensis NOT DETECTED NOT DETECTED Final   Streptococcus species NOT DETECTED NOT DETECTED Final   Streptococcus agalactiae NOT DETECTED NOT DETECTED Final   Streptococcus pneumoniae NOT DETECTED NOT DETECTED Final   Streptococcus pyogenes NOT DETECTED NOT DETECTED Final   A.calcoaceticus-baumannii NOT DETECTED NOT DETECTED Final   Bacteroides fragilis NOT DETECTED NOT DETECTED Final   Enterobacterales DETECTED (A) NOT DETECTED Final    Comment: Enterobacterales represent a large order of gram negative bacteria, not a single organism. CRITICAL RESULT CALLED TO, READ BACK BY AND VERIFIED WITH: E. SINCLAIR PHARMD AT 1153 09/14/22 D. VANHOOK    Enterobacter cloacae complex NOT DETECTED NOT DETECTED Final   Escherichia coli NOT DETECTED NOT DETECTED Final   Klebsiella aerogenes NOT DETECTED NOT DETECTED Final   Klebsiella oxytoca NOT DETECTED NOT DETECTED Final   Klebsiella pneumoniae NOT DETECTED NOT DETECTED Final   Proteus species DETECTED (A) NOT DETECTED Final    Comment: CRITICAL RESULT CALLED TO, READ BACK BY AND VERIFIED WITH: E. SINCLAIR PHARMD AT 1153 09/14/22 D. VANHOOK    Salmonella species NOT DETECTED NOT DETECTED Final   Serratia marcescens NOT DETECTED NOT DETECTED Final   Haemophilus influenzae NOT DETECTED NOT DETECTED Final   Neisseria meningitidis NOT DETECTED NOT DETECTED Final   Pseudomonas aeruginosa NOT DETECTED NOT DETECTED Final   Stenotrophomonas maltophilia NOT DETECTED NOT DETECTED Final   Candida albicans NOT DETECTED NOT  DETECTED Final   Candida auris NOT DETECTED NOT DETECTED Final   Candida glabrata NOT DETECTED NOT DETECTED Final   Candida krusei NOT DETECTED NOT DETECTED Final   Candida parapsilosis NOT DETECTED NOT DETECTED Final   Candida tropicalis NOT  DETECTED NOT DETECTED Final   Cryptococcus neoformans/gattii NOT DETECTED NOT DETECTED Final   CTX-M ESBL NOT DETECTED NOT DETECTED Final   Carbapenem resistance IMP NOT DETECTED NOT DETECTED Final   Carbapenem resistance KPC NOT DETECTED NOT DETECTED Final   Carbapenem resistance NDM NOT DETECTED NOT DETECTED Final   Carbapenem resist OXA 48 LIKE NOT DETECTED NOT DETECTED Final   Carbapenem resistance VIM NOT DETECTED NOT DETECTED Final    Comment: Performed at [facility], [address], [place], [place] [address]  Blood Culture (routine x 2)     Status: Abnormal   Collection Time: 09/13/22  7:31 PM   Specimen: BLOOD  Result Value Ref Range Status   Specimen Description BLOOD LEFT ANTECUBITAL  Final   Special Requests   Final    BOTTLES DRAWN AEROBIC AND ANAEROBIC Blood Culture results may not be optimal due to an excessive volume of blood received in culture bottles   Culture  Setup Time   Final    GRAM NEGATIVE RODS ANAEROBIC BOTTLE ONLY CRITICAL RESULT CALLED TO, READ BACK BY AND VERIFIED WITH: PHARMD LISA CURRAN ON 09/15/22 @ 1809 BY DRT    Culture (A)  Final    PREVOTELLA BIVIA BETA LACTAMASE POSITIVE Performed at [facility], [address] [place], [place] [address]    Report Status 09/17/2022 FINAL  Final  Surgical pcr screen     Status: None   Collection Time: 09/16/22  4:50 PM   Specimen: Nasal Mucosa; Nasal Swab  Result Value Ref Range Status   MRSA, PCR NEGATIVE NEGATIVE Final   Staphylococcus aureus NEGATIVE NEGATIVE Final    Comment: (NOTE) The Xpert SA Assay (FDA approved for NASAL specimens in patients 58 years of age and older), is one component of a comprehensive surveillance program. It is not intended to diagnose infection nor to guide or monitor treatment. Performed at [facility], [address] [place], [place] [address]   Aerobic/Anaerobic Culture w Gram Stain (surgical/deep wound)     Status: None (Preliminary result)    Collection Time: 09/17/22  8:48 AM   Specimen: Soft Tissue, Other  Result Value Ref Range Status   Specimen Description TISSUE  Final   Special Requests left leg wound  Final   Gram Stain   Final    ABUNDANT GRAM NEGATIVE RODS ABUNDANT GRAM POSITIVE COCCI FEW GRAM POSITIVE RODS FEW WBC PRESENT, PREDOMINANTLY PMN    Culture   Final    MODERATE GRAM NEGATIVE RODS IDENTIFICATION AND SUSCEPTIBILITIES TO FOLLOW CULTURE REINCUBATED FOR BETTER GROWTH Performed at [facility], [address], [place], [place] [address]    Report Status PENDING  Incomplete    Pertinent Lab seen by me:    Latest Ref Rng & Units 09/19/2022    2:05 AM 09/18/2022    2:58 AM 09/17/2022    2:46 AM  CBC  WBC 4.0 - 10.5 K/uL 19.8  24.3  25.3   Hemoglobin 13.0 - 17.0 g/dL 8.6  8.1  8.7   Hematocrit 39.0 - 52.0 % 27.3  25.6  26.1   Platelets 150 - 400 K/uL 699  588  589       Latest Ref Rng &  Units 09/19/2022    2:05 AM 09/18/2022    2:58 AM 09/17/2022    2:46 AM  CMP  Glucose 70 - 99 mg/dL 604  540  981   BUN 8 - 23 mg/dL 16  16  8    Creatinine 0.61 - 1.24 mg/dL 1.91  4.78  2.95   Sodium 135 - 145 mmol/L 131  130  128   Potassium 3.5 - 5.1 mmol/L 3.7  3.5  3.5   Chloride 98 - 111 mmol/L 100  98  97   CO2 22 - 32 mmol/L 24  23  23    Calcium 8.9 - 10.3 mg/dL 7.9  8.0  8.0   Total Protein 6.5 - 8.1 g/dL 6.2  5.7  6.0   Total Bilirubin 0.3 - 1.2 mg/dL 0.5  0.3  0.8   Alkaline Phos 38 - 126 U/L 89  95  104   AST 15 - 41 U/L 17  25  17    ALT 0 - 44 U/L 14  14  13       Pertinent Imagings/Other Imagings Plain films and CT images have been personally visualized and interpreted; radiology reports have been reviewed. Decision making incorporated into the Impression / Recommendations.  DG Chest Port 1 View  Result Date: 09/13/2022 CLINICAL DATA:  Questionable sepsis - evaluate for abnormality EXAM: PORTABLE CHEST 1 VIEW COMPARISON:  12/28/2010 FINDINGS: The heart size and mediastinal contours are within  normal limits. Both lungs are clear. The visualized skeletal structures are unremarkable. IMPRESSION: No active disease. Electronically Signed   By: Charlett Nose M.D.   On: 09/13/2022 19:18   MR FOOT LEFT W WO CONTRAST  Result Date: 09/04/2022 CLINICAL DATA:  Open wound left lateral foot. EXAM: MRI OF THE LEFT FOREFOOT WITHOUT AND WITH CONTRAST TECHNIQUE: Multiplanar, multisequence MR imaging of the left forefoot was performed both before and after administration of intravenous contrast. CONTRAST:  10mL GADAVIST GADOBUTROL 1 MMOL/ML IV SOLN COMPARISON:  Radiographs dated September 04, 2022 FINDINGS: Bones/Joint/Cartilage Postsurgical changes with prior amputation through the second metatarsal. Bone marrow edema of the cuboid, fifth metatarsal, base of the proximal phalanx of the fifth digit. There is marrow edema of the base of the 2nd-4th metatarsal without evidence of adjacent deep skin wound. Ligaments Lisfranc and collateral ligaments are intact. Muscles and Tendons Increased intramuscular signal of the plantar muscles suggesting myopathy/myositis. Soft tissues Large skin wound about the lateral aspect of the foot extending from the cuboid to the base of the fifth metatarsal with peripherally enhancing fluid collection measuring at least 1.7 x 1.2 x 5.7 cm. IMPRESSION: 1. Large skin wound about the lateral aspect of the foot extending from the cuboid to the base of the fifth metatarsal with peripherally enhancing fluid collection measuring at least 1.7 x 1.2 x 5.7 cm concerning for abscess. 2. Bone marrow edema of the cuboid, base of the fifth metatarsal, base of the proximal phalanx of the fifth digit concerning for osteomyelitis. 3. Mild marrow edema of the base of the 2nd-4th metatarsals without evidence of adjacent deep skin wound which may represent reactive edema secondary to altered mechanics. 4. Increased intramuscular signal of the plantar muscles suggesting myopathy/myositis. Electronically Signed   By:  Larose Hires D.O.   On: 09/04/2022 15:49   DG Foot Complete Left  Result Date: 09/04/2022 CLINICAL DATA:  Left foot wound. EXAM: LEFT FOOT - COMPLETE 3+ VIEW COMPARISON:  July 30, 2022. FINDINGS: Status post surgical amputation of distal portion of second metatarsal  as well as second toe phalanges. Vascular calcifications are noted. Soft tissue wound is seen lateral to distal portion of fifth metatarsal. There is underlying lytic destruction of the distal head of fifth metatarsal suggesting osteomyelitis. Larger wound is seen more posteriorly along the lateral portion of the foot with underlying lytic destruction of proximal base of fifth metatarsal. IMPRESSION: Two soft tissue wounds or ulcerations are noted along the lateral aspect of the left foot, with underlying lytic destruction of the proximal and distal portions of the fifth metatarsal consistent with osteomyelitis. Electronically Signed   By: Lupita Raider M.D.   On: 09/04/2022 12:42     I have personally spent 87 minutes involved in face-to-face and non-face-to-face activities for this patient on the day of the visit. Professional time spent includes the following activities: Preparing to see the patient (review of tests), Obtaining and/or reviewing separately obtained history (admission/discharge record), Performing a medically appropriate examination and/or evaluation , Ordering medications/tests/procedures, referring and communicating with other health care professionals, Documenting clinical information in the EMR, Independently interpreting results (not separately reported), Communicating results to the patient/family/caregiver, Counseling and educating the patient/family/caregiver and Care coordination (not separately reported).  Electronically signed by:   Plan d/w requesting provider as well as ID pharm D  Note: This document was prepared using dragon voice recognition software and may include unintentional dictation errors.   Odette Fraction, MD Infectious Disease Physician [facility] Pager: [phone]

## 2022-09-19 ENCOUNTER — Other Ambulatory Visit: Payer: Self-pay | Admitting: Internal Medicine

## 2022-09-19 DIAGNOSIS — E860 Dehydration: Secondary | ICD-10-CM | POA: Diagnosis not present

## 2022-09-19 DIAGNOSIS — M869 Osteomyelitis, unspecified: Secondary | ICD-10-CM | POA: Diagnosis not present

## 2022-09-19 DIAGNOSIS — N19 Unspecified kidney failure: Secondary | ICD-10-CM | POA: Diagnosis not present

## 2022-09-19 DIAGNOSIS — I10 Essential (primary) hypertension: Secondary | ICD-10-CM

## 2022-09-19 DIAGNOSIS — E871 Hypo-osmolality and hyponatremia: Secondary | ICD-10-CM | POA: Diagnosis not present

## 2022-09-19 LAB — GLUCOSE, CAPILLARY
Glucose-Capillary: 163 mg/dL — ABNORMAL HIGH (ref 70–99)
Glucose-Capillary: 166 mg/dL — ABNORMAL HIGH (ref 70–99)
Glucose-Capillary: 183 mg/dL — ABNORMAL HIGH (ref 70–99)
Glucose-Capillary: 217 mg/dL — ABNORMAL HIGH (ref 70–99)

## 2022-09-19 NOTE — Progress Notes (Signed)
Physical Therapy Treatment Patient Details Name: Grant Yang MRN: [id number] DOB: December 13, 1955 Today's Date: 09/19/2022   History of Present Illness Admitted with L LE infection necesitating transtibial amputation;  has a past medical history of Cancer (HCC), Diabetes mellitus, GERD (gastroesophageal reflux disease), History of kidney stones, Hypertension, Neuropathy in diabetes (HCC), Shortness of breath, and Stroke (HCC) (2010).    PT Comments  Pt seated in recliner on arrival.  Focused on LE strengthening in standing and transfer training.  He reports needs to have BM and left sitting on commode.  He remains and excellent candidate for aggressive rehab in a post acute setting.      If plan is discharge home, recommend the following: A little help with walking and/or transfers;A little help with bathing/dressing/bathroom;Assistance with cooking/housework;Assist for transportation   Can travel by private vehicle        Equipment Recommendations  BSC/3in1;Wheelchair (measurements PT);Wheelchair cushion (measurements PT)    Recommendations for Other Services Rehab consult (for consideration of AIR)     Precautions / Restrictions Precautions Precautions: Fall;Other (comment) Precaution Comments: Enteric Precautions Restrictions Weight Bearing Restrictions: No     Mobility  Bed Mobility               General bed mobility comments: in recliner on arrival.    Transfers Overall transfer level: Needs assistance Equipment used: Rolling walker (2 wheels) Transfers: Sit to/from Stand, Bed to chair/wheelchair/BSC Sit to Stand: Min guard Stand pivot transfers: Min guard         General transfer comment: min guard for STS and pivot transfer, Cues for hand placement to and from seated surface.    Ambulation/Gait Ambulation/Gait assistance: Min guard Gait Distance (Feet): 2 Feet (two steps forward and 2 steps backwards.) Assistive device: Rolling walker (2 wheels) Gait  Pattern/deviations: Step-to pattern, Trunk flexed       General Gait Details: Cues for sequencing and safety.  Pt required return to seated position due to need to void and fatigue.   Stairs             Wheelchair Mobility     Tilt Bed    Modified Rankin (Stroke Patients Only)       Balance Overall balance assessment: Needs assistance Sitting-balance support: Feet supported Sitting balance-Leahy Scale: Good       Standing balance-Leahy Scale: Poor Standing balance comment: reliant on RW for support                            Cognition Arousal/Alertness: Awake/alert Behavior During Therapy: WFL for tasks assessed/performed Overall Cognitive Status: Within Functional Limits for tasks assessed                                          Exercises General Exercises - Lower Extremity Hip Flexion/Marching: AROM, Left, 10 reps, Standing Heel Raises: AROM, Right, 10 reps, Standing Mini-Sqauts: AROM, Right, 10 reps, Standing Amputee Exercises Hip Extension: AROM, Left, 10 reps, Standing    General Comments        Pertinent Vitals/Pain Pain Assessment Pain Assessment: 0-10 Pain Score: 6  Pain Location: LLE at surgical site in dependent position Pain Descriptors / Indicators: Throbbing Pain Intervention(s): Monitored during session, Repositioned    Home Living Family/patient expects to be discharged to:: Private residence Living Arrangements: Alone Available Help at Discharge: Friend(s);Family;Available PRN/intermittently Type  of Home: House Home Access: Ramped entrance       Home Layout: One level Home Equipment: Agricultural consultant (2 wheels) Additional Comments: Pt states he has old RW, shower stool but may need something more stable. Pt and son will redo bathroom to make more accessbile.    Prior Function            PT Goals (current goals can now be found in the care plan section) Acute Rehab PT Goals Patient Stated Goal:  Wants to get a prosthesis Potential to Achieve Goals: Good Additional Goals Additional Goal #1: Pt will deomonstrate proficiency in wheelchair propulsion and parts management with modified independence Progress towards PT goals: Progressing toward goals    Frequency    Min 1X/week      PT Plan Current plan remains appropriate    Co-evaluation              AM-PAC PT "6 Clicks" Mobility   Outcome Measure  Help needed turning from your back to your side while in a flat bed without using bedrails?: None Help needed moving from lying on your back to sitting on the side of a flat bed without using bedrails?: A Little Help needed moving to and from a bed to a chair (including a wheelchair)?: A Little Help needed standing up from a chair using your arms (e.g., wheelchair or bedside chair)?: A Little Help needed to walk in hospital room?: A Little Help needed climbing 3-5 steps with a railing? : Total 6 Click Score: 17    End of Session Equipment Utilized During Treatment: Gait belt Activity Tolerance: Patient tolerated treatment well Patient left: with call bell/phone within reach (sitting on commode in efforts to have a BM.  Nurse aware of his location.) Nurse Communication: Mobility status PT Visit Diagnosis: Unsteadiness on feet (R26.81);Other abnormalities of gait and mobility (R26.89)     Time: 1610-9604 PT Time Calculation (min) (ACUTE ONLY): 16 min  Charges:    $Therapeutic Activity: 8-22 mins PT General Charges $$ ACUTE PT VISIT: 1 Visit                     Bonney Leitz , PTA Acute Rehabilitation Services Office [phone]    Florestine Avers 09/19/2022, 3:26 PM

## 2022-09-19 NOTE — Progress Notes (Signed)
Patient ID: Grant Yang, male   DOB: [birth year], 67 y.o.   MRN: [id number] Patient is status post left below-knee amputation.  There is no drainage in the wound VAC canister.  Will plan to discontinue the wound VAC at time of discharge.  Patient may be a candidate for inpatient rehab.  The cultures are pending.  Ideally patient could be discharged on 4 weeks of oral antibiotics to cover the soft tissue infection.  No signs of bone infection at time of surgery.

## 2022-09-19 NOTE — Evaluation (Signed)
Occupational Therapy Evaluation Patient Details Name: Grant Yang MRN: [id number] DOB: [birth year] Today's Date: 09/19/2022   History of Present Illness Admitted with L LE infection necesitating transtibial amputation;  has a past medical history of Cancer (HCC), Diabetes mellitus, GERD (gastroesophageal reflux disease), History of kidney stones, Hypertension, Neuropathy in diabetes (HCC), Shortness of breath, and Stroke (HCC) (2010).   Clinical Impression   Pt s/p above diagnosis. Pt doing well, in good spirits, able to fully participate in therapy. Pt PLOF independent, lives alone, has son who lives close by and can assist as needed. Pt states son will redo bathroom to make more accessible and add shower chair/grab bars. Pt overall good BUE strength, ROM, ability to complete/participate in ADLs and pivot transfers with min guard. Pt would benefit from continued skilled therapy to maximize progress and instruct on compensatory strategies and improve mobility. Postacute rehab >3hrs/day recommended to quickly improve with mobility prior to DC home.      Recommendations for follow up therapy are one component of a multi-disciplinary discharge planning process, led by the attending physician.  Recommendations may be updated based on patient status, additional functional criteria and insurance authorization.   Assistance Recommended at Discharge Frequent or constant Supervision/Assistance  Patient can return home with the following A lot of help with walking and/or transfers;A little help with bathing/dressing/bathroom;Assistance with cooking/housework;Assist for transportation;Help with stairs or ramp for entrance    Functional Status Assessment  Patient has had a recent decline in their functional status and demonstrates the ability to make significant improvements in function in a reasonable and predictable amount of time.  Equipment Recommendations  Other (comment) (defer)     Recommendations for Other Services       Precautions / Restrictions Precautions Precautions: Fall;Other (comment) Precaution Comments: Enteric Precautions Restrictions Weight Bearing Restrictions: No      Mobility Bed Mobility Overal bed mobility: Modified Independent             General bed mobility comments: abhle to perform supine to sit mod I    Transfers Overall transfer level: Needs assistance Equipment used: Rolling walker (2 wheels) Transfers: Sit to/from Stand, Bed to chair/wheelchair/BSC Sit to Stand: Min guard Stand pivot transfers: Min guard   Step pivot transfers: Min guard     General transfer comment: min guard for STS and pivot transfer, difficulty hopping using RW today      Balance Overall balance assessment: Needs assistance Sitting-balance support: Feet supported Sitting balance-Leahy Scale: Good Sitting balance - Comments: EOB/recliner ADLs   Standing balance support: During functional activity, Reliant on assistive device for balance Standing balance-Leahy Scale: Poor Standing balance comment: reliant on RW for support                           ADL either performed or assessed with clinical judgement   ADL Overall ADL's : Needs assistance/impaired Eating/Feeding: Independent   Grooming: Set up;Sitting   Upper Body Bathing: Set up;Sitting   Lower Body Bathing: Min guard;Sitting/lateral leans   Upper Body Dressing : Set up;Sitting   Lower Body Dressing: Minimal assistance;Sitting/lateral leans   Toilet Transfer: Min guard;Rolling walker (2 wheels);BSC/3in1   Toileting- Clothing Manipulation and Hygiene: Modified independent;Sitting/lateral lean         General ADL Comments: good overall BUE strength and ROM to perform ADLs, able to stand pivot transfer to BSC/recliner min guard with RW     Vision Baseline Vision/History: 1 Wears glasses Ability  to See in Adequate Light: 1 Impaired Patient Visual Report: No  change from baseline       Perception     Praxis      Pertinent Vitals/Pain Pain Assessment Pain Assessment: 0-10 Pain Score: 4  Pain Intervention(s): Monitored during session     Hand Dominance Right   Extremity/Trunk Assessment Upper Extremity Assessment Upper Extremity Assessment: Overall WFL for tasks assessed   Lower Extremity Assessment Lower Extremity Assessment: Defer to PT evaluation       Communication Communication Communication: No difficulties   Cognition Arousal/Alertness: Awake/alert Behavior During Therapy: WFL for tasks assessed/performed Overall Cognitive Status: Within Functional Limits for tasks assessed                                       General Comments       Exercises     Shoulder Instructions      Home Living Family/patient expects to be discharged to:: Private residence Living Arrangements: Alone Available Help at Discharge: Friend(s);Family;Available PRN/intermittently Type of Home: House Home Access: Ramped entrance     Home Layout: One level     Bathroom Shower/Tub: Chief Strategy Officer: Standard     Home Equipment: Agricultural consultant (2 wheels)   Additional Comments: Pt states he has old RW, shower stool but may need something more stable. Pt and son will redo bathroom to make more accessbile.      Prior Functioning/Environment Prior Level of Function : Independent/Modified Independent;Driving             Mobility Comments: Independent ADLs Comments: Currently takes standing showers in the tub/shower combo        OT Problem List: Decreased strength;Decreased activity tolerance;Impaired balance (sitting and/or standing);Decreased knowledge of use of DME or AE;Pain      OT Treatment/Interventions: Self-care/ADL training;Therapeutic exercise;DME and/or AE instruction;Therapeutic activities;Patient/family education    OT Goals(Current goals can be found in the care plan section) Acute  Rehab OT Goals Patient Stated Goal: to return home OT Goal Formulation: With patient Time For Goal Achievement: 10/03/22 Potential to Achieve Goals: Good  OT Frequency: Min 1X/week    Co-evaluation              AM-PAC OT "6 Clicks" Daily Activity     Outcome Measure Help from another person eating meals?: None Help from another person taking care of personal grooming?: A Little Help from another person toileting, which includes using toliet, bedpan, or urinal?: A Little Help from another person bathing (including washing, rinsing, drying)?: A Little Help from another person to put on and taking off regular upper body clothing?: A Little Help from another person to put on and taking off regular lower body clothing?: A Little 6 Click Score: 19   End of Session Equipment Utilized During Treatment: Gait belt;Rolling walker (2 wheels) Nurse Communication: Mobility status  Activity Tolerance: Patient tolerated treatment well Patient left: in chair;with call bell/phone within reach  OT Visit Diagnosis: Unsteadiness on feet (R26.81);Other abnormalities of gait and mobility (R26.89);Pain Pain - Right/Left: Left Pain - part of body: Leg                Time: 4098-1191 OT Time Calculation (min): 31 min Charges:  OT General Charges $OT Visit: 1 Visit OT Evaluation $OT Eval Low Complexity: 1 Low OT Treatments $Self Care/Home Management : 8-22 mins  Tilden Fossa, OTR/L  Alexis Goodell 09/19/2022, 12:28 PM

## 2022-09-19 NOTE — Inpatient Diabetes Management (Signed)
Inpatient Diabetes Program Recommendations  AACE/ADA: New Consensus Statement on Inpatient Glycemic Control (2015)  Target Ranges:  Prepandial:   less than 140 mg/dL      Peak postprandial:   less than 180 mg/dL (1-2 hours)      Critically ill patients:  140 - 180 mg/dL   Lab Results  Component Value Date   GLUCAP 166 (H) 09/19/2022   HGBA1C 9.8 (H) 09/16/2022    Review of Glycemic Control  Diabetes history: DM2 Outpatient Diabetes medications: Novolin 70/30 10 units BID, Mounjaro 2.5 mg weekly Current orders for Inpatient glycemic control: Novolog 0-9 units Q6H  HgbA1C - 9.8% CBGs 217, 166, 163  Inpatient Diabetes Program Recommendations:    May need small amount of basal insulin - Semglee 10 units every day  Spoke with pt at bedside regarding his diabetes control and HgbA1C of 9.8%.  Pt states his blood sugars have improved since starting Mounjaro. Uses meter to check blood sugars and has used Dexcom G7 in the past. Would like prescription for Dexcom at discharge.  Will give pt Dexcom sample to apply at discharge. Pt hoping to go to [facility] for rehab. Discussed glucose and HgbA1C goals. Encouraged pt to eat healthy and control blood sugars for better wound healing. Appreciative of visit.  Continue to follow.  Thank you. Ailene Ards, RD, LDN, CDCES Inpatient Diabetes Coordinator [phone]

## 2022-09-19 NOTE — PMR Pre-admission (Shared)
PMR Admission Coordinator Pre-Admission Assessment  Patient: Grant Yang is an 67 y.o., male MRN: [id number] DOB: [birth year] Height: 5' 10.98" (180.3 cm) Weight: 92.5 kg              Insurance Information HMO: yes    PPO:      PCP:      IPA:      80/20:      OTHER:  PRIMARY: [employer] Medicare      Policy#: [id number]      Subscriber: pt CM Name: ***      Phone#: [phone]     Fax#: [phone] Pre-Cert#: ***      Employer:  Benefits:  Phone #: [phone]     Name:  Eff. Date: 02/20/22     Deduct: $0      Out of Pocket Max: ***      Life Max: n/a  CIR: ***      SNF: 20 full days Outpatient:      Co-Pay: *** Home Health: 100%      Co-Pay:  DME: 80%     Co-Pay: 20% Providers:  SECONDARY:       Policy#:       Phone#:   Artist:       Phone#:   The "Data Collection Information Summary" for patients in Inpatient Rehabilitation Facilities with attached "Privacy Act Statement-Health Care Records" was provided and verbally reviewed with: Patient and Family  Emergency Contact Information Contact Information     Name Relation Home Work Mobile   [place] Son [phone]        Other Contacts     Name Relation Home Work Mobile   pruitt,andrea Daughter   [phone]      Current Medical History  Patient Admitting Diagnosis: L BKA  History of Present Illness: Pt is a 67 y/o male with diabetes, left 2nd toe amp, and peripheral neuropathy who developed a necrotic ulceration of his left lateral foot which did not heal. Pt had balloon angioplasty of the LLE in June of this year.  He presented to ED on 09/04/22 with a worsening foot wound, septic. MRI done on 09/04/22 demonstrated bone marrow edema of the cuboid and base of the fifth metatarsal, and base of proximal phalanx of digit concerning for osteomyelitis. He was placed on broad spectrum antibiotics. 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 Plan is continue  antibiotics for another 3-4 weeks given his bacteremia. Course also complicated by antibiotic related diarrhea, hyponatremia, hypokalemia, and uncontrolled diabetes. Therapy evaluations completed and pt was recommended for CIR to maximize independence prior to return home.     Glasgow Coma Scale Score: 15  Patient's medical record from [facility] has been reviewed by the rehabilitation admission coordinator and physician.  Past Medical History  Past Medical History:  Diagnosis Date   Cancer [facility])    prostate cancer    Diabetes mellitus    GERD (gastroesophageal reflux disease)    History of kidney stones    Hypertension    Neuropathy in diabetes [facility])    bilateral lower extremities    Shortness of breath    patient states it hurts to breath; this was when he use to smoke cigarettes    Stroke [facility] [place]) 2010   "mini stroke" patient cant remember when; states it happened in w. [place] in 2010 . denies any residual deficits     Has the patient had major surgery during 100 days  prior to admission? Yes  Family History  family history includes Cancer in his father; Heart failure in his mother.   Current Medications   Current Facility-Administered Medications:    0.9 %  sodium chloride infusion, , Intravenous, Continuous, Nadara Mustard, MD   acetaminophen (TYLENOL) tablet 650 mg, 650 mg, Oral, Q6H PRN, 650 mg at 09/18/22 1843 **OR** acetaminophen (TYLENOL) suppository 650 mg, 650 mg, Rectal, Q6H PRN, Nadara Mustard, MD   alum & mag hydroxide-simeth (MAALOX/MYLANTA) 200-200-20 MG/5ML suspension 15-30 mL, 15-30 mL, Oral, Q2H PRN, Nadara Mustard, MD   ascorbic acid (VITAMIN C) tablet 1,000 mg, 1,000 mg, Oral, Daily, Nadara Mustard, MD, 1,000 mg at 09/19/22 0741   bisacodyl (DULCOLAX) EC tablet 5 mg, 5 mg, Oral, Daily PRN, Nadara Mustard, MD   ceFEPIme (MAXIPIME) 2 g in sodium chloride 0.9 % 100 mL IVPB, 2 g, Intravenous, Q8H, Nadara Mustard, MD, Last Rate: 200 mL/hr at 09/19/22 0731, 2 g at  09/19/22 0731   docusate sodium (COLACE) capsule 100 mg, 100 mg, Oral, Daily, Nadara Mustard, MD, 100 mg at 09/19/22 0741   fentaNYL (SUBLIMAZE) injection 25 mcg, 25 mcg, Intravenous, Q2H PRN, Nadara Mustard, MD   guaiFENesin-dextromethorphan (ROBITUSSIN DM) 100-10 MG/5ML syrup 15 mL, 15 mL, Oral, Q4H PRN, Nadara Mustard, MD   hydrALAZINE (APRESOLINE) injection 5 mg, 5 mg, Intravenous, Q20 Min PRN, Nadara Mustard, MD   HYDROmorphone (DILAUDID) injection 0.5-1 mg, 0.5-1 mg, Intravenous, Q4H PRN, Nadara Mustard, MD   insulin aspart (novoLOG) injection 0-9 Units, 0-9 Units, Subcutaneous, Q6H, Nadara Mustard, MD, 2 Units at 09/19/22 1243   labetalol (NORMODYNE) injection 10 mg, 10 mg, Intravenous, Q10 min PRN, Nadara Mustard, MD   magnesium citrate solution 1 Bottle, 1 Bottle, Oral, Once PRN, Nadara Mustard, MD   magnesium sulfate IVPB 2 g 50 mL, 2 g, Intravenous, Daily PRN, Nadara Mustard, MD   melatonin tablet 3 mg, 3 mg, Oral, QHS PRN, Nadara Mustard, MD, 3 mg at 09/18/22 2104   metoprolol tartrate (LOPRESSOR) injection 2-5 mg, 2-5 mg, Intravenous, Q2H PRN, Nadara Mustard, MD   metroNIDAZOLE (FLAGYL) IVPB 500 mg, 500 mg, Intravenous, Q12H, Nadara Mustard, MD, Last Rate: 100 mL/hr at 09/19/22 1013, 500 mg at 09/19/22 1013   naloxone (NARCAN) injection 0.4 mg, 0.4 mg, Intravenous, PRN, Nadara Mustard, MD   nutrition supplement (JUVEN) (JUVEN) powder packet 1 packet, 1 packet, Oral, BID BM, Nadara Mustard, MD, 1 packet at 09/19/22 1008   ondansetron [facility]) injection 4 mg, 4 mg, Intravenous, Q6H PRN, Nadara Mustard, MD   oxyCODONE (Oxy IR/ROXICODONE) immediate release tablet 10-15 mg, 10-15 mg, Oral, Q4H PRN, Nadara Mustard, MD, 10 mg at 09/18/22 2104   oxyCODONE (Oxy IR/ROXICODONE) immediate release tablet 5 mg, 5 mg, Oral, Q6H PRN, Nadara Mustard, MD   oxyCODONE (Oxy IR/ROXICODONE) immediate release tablet 5-10 mg, 5-10 mg, Oral, Q4H PRN, Nadara Mustard, MD, 5 mg at 09/19/22 1008   pantoprazole  (PROTONIX) EC tablet 40 mg, 40 mg, Oral, Daily, Nadara Mustard, MD, 40 mg at 09/19/22 0741   phenol (CHLORASEPTIC) mouth spray 1 spray, 1 spray, Mouth/Throat, PRN, Nadara Mustard, MD   polyethylene glycol (MIRALAX / GLYCOLAX) packet 17 g, 17 g, Oral, Daily PRN, Nadara Mustard, MD   potassium chloride SA (KLOR-CON M) CR tablet 20-40 mEq, 20-40 mEq, Oral, Daily PRN, Nadara Mustard, MD   zinc sulfate  capsule 220 mg, 220 mg, Oral, Daily, Nadara Mustard, MD, 220 mg at 09/19/22 0981  Patients Current Diet:  Diet Order             Diet Carb Modified Fluid consistency: Thin; Room service appropriate? Yes  Diet effective now                   Precautions / Restrictions Precautions Precautions: Fall, Other (comment) Precaution Comments: Enteric Precautions Restrictions Weight Bearing Restrictions: No   Has the patient had 2 or more falls or a fall with injury in the past year?Yes  Prior Activity Level Community (5-7x/wk): independent prior without DME, driving  Prior Functional Level Prior Function Prior Level of Function : Independent/Modified Independent, Driving Mobility Comments: Independent ADLs Comments: Currently takes standing showers in the tub/shower combo  Self Care: Did the patient need help bathing, dressing, using the toilet or eating?  Independent  Indoor Mobility: Did the patient need assistance with walking from room to room (with or without device)? Independent  Stairs: Did the patient need assistance with internal or external stairs (with or without device)? Independent  Functional Cognition: Did the patient need help planning regular tasks such as shopping or remembering to take medications? Independent  Patient Information Are you of Hispanic, Latino/a,or Spanish origin?: A. No, not of Hispanic, Latino/a, or Spanish origin What is your race?: A. White Do you need or want an interpreter to communicate with a doctor or health care staff?: 0. No  Patient's  Response To:  Health Literacy and Transportation Is the patient able to respond to health literacy and transportation needs?: Yes Health Literacy - How often do you need to have someone help you when you read instructions, pamphlets, or other written material from your doctor or pharmacy?: Never In the past 12 months, has lack of transportation kept you from medical appointments or from getting medications?: No In the past 12 months, has lack of transportation kept you from meetings, work, or from getting things needed for daily living?: No  Home Assistive Devices / Equipment Home Equipment: Agricultural consultant (2 wheels)  Prior Device Use: Indicate devices/aids used by the patient prior to current illness, exacerbation or injury? None of the above  Current Functional Level Cognition  Overall Cognitive Status: Within Functional Limits for tasks assessed Orientation Level: Oriented X4    Extremity Assessment (includes Sensation/Coordination)  Upper Extremity Assessment: Overall WFL for tasks assessed  Lower Extremity Assessment: Defer to PT evaluation LLE Deficits / Details: L hip and knee flexion WFL; showing good quad contraction, and good gluteal recruitment with LLE bolstered bridging    ADLs  Overall ADL's : Needs assistance/impaired Eating/Feeding: Independent Grooming: Set up, Sitting Upper Body Bathing: Set up, Sitting Lower Body Bathing: Min guard, Sitting/lateral leans Upper Body Dressing : Set up, Sitting Lower Body Dressing: Minimal assistance, Sitting/lateral leans Toilet Transfer: Min guard, Rolling walker (2 wheels), BSC/3in1 Toileting- Clothing Manipulation and Hygiene: Modified independent, Sitting/lateral lean General ADL Comments: good overall BUE strength and ROM to perform ADLs, able to stand pivot transfer to BSC/recliner min guard with RW    Mobility  Overal bed mobility: Modified Independent Bed Mobility: Supine to Sit Supine to sit: Min guard General bed  mobility comments: in recliner on arrival.    Transfers  Overall transfer level: Needs assistance Equipment used: Rolling walker (2 wheels) Transfers: Sit to/from Stand, Bed to chair/wheelchair/BSC Sit to Stand: Min guard Bed to/from chair/wheelchair/BSC transfer type:: Stand pivot Stand pivot transfers: [employer]  guard Step pivot transfers: Min guard General transfer comment: min guard for STS and pivot transfer, Cues for hand placement to and from seated surface.    Ambulation / Gait / Stairs / Wheelchair Mobility  Ambulation/Gait Ambulation/Gait assistance: Land (Feet): 2 Feet (two steps forward and 2 steps backwards.) Assistive device: Rolling walker (2 wheels) Gait Pattern/deviations: Step-to pattern, Trunk flexed General Gait Details: Cues for sequencing and safety.  Pt required return to seated position due to need to void and fatigue.    Posture / Balance Dynamic Sitting Balance Sitting balance - Comments: EOB/recliner ADLs Balance Overall balance assessment: Needs assistance Sitting-balance support: Feet supported Sitting balance-Leahy Scale: Good Sitting balance - Comments: EOB/recliner ADLs Standing balance support: During functional activity, Reliant on assistive device for balance Standing balance-Leahy Scale: Poor Standing balance comment: reliant on RW for support    Special needs/care consideration Skin amputation incision to LLE and Diabetic management yes     Previous Home Environment (from acute therapy documentation) Living Arrangements: Alone Available Help at Discharge: Friend(s), Family, Available PRN/intermittently Type of Home: House Home Layout: One level Home Access: Ramped entrance Bathroom Shower/Tub: Engineer, manufacturing systems: Standard Bathroom Accessibility: Yes Additional Comments: Pt states he has old RW, shower stool but may need something more stable. Pt and son will redo bathroom to make more accessbile.  Discharge  Living Setting Plans for Discharge Living Setting: Patient's home Type of Home at Discharge: House Discharge Home Layout: One level Discharge Home Access: Ramped entrance Discharge Bathroom Shower/Tub: Tub/shower unit Discharge Bathroom Toilet: Standard Discharge Bathroom Accessibility: Yes How Accessible: Accessible via walker Does the patient have any problems obtaining your medications?: No  Social/Family/Support Systems Anticipated Caregiver: mod I goals, can call son Ladona Ridgel with updates Anticipated Caregiver's Contact Information: Ladona Ridgel [phone] Ability/Limitations of Caregiver: n/a Caregiver Availability: Intermittent Discharge Plan Discussed with Primary Caregiver: Yes Is Caregiver In Agreement with Plan?: Yes Does Caregiver/Family have Issues with Lodging/Transportation while Pt is in Rehab?: No   Goals Patient/Family Goal for Rehab: PT/OT mod I, SLP n/a Expected length of stay: 8-10 days Additional Information: discharge plan: return home mod I, pt has a son/daughter, an ex wife, and a good friend who can provide intermittent support if needed Pt/Family Agrees to Admission and willing to participate: Yes Program Orientation Provided & Reviewed with Pt/Caregiver Including Roles  & Responsibilities: Yes  Barriers to Discharge: Decreased caregiver support   Decrease burden of Care through IP rehab admission: n/a   Possible need for SNF placement upon discharge: Not anticipated.  Pt with mod I goals.     Patient Condition: This patient's condition remains as documented in the consult dated 09/18/22, in which the Rehabilitation Physician determined and documented that the patient's condition is appropriate for intensive rehabilitative care in an inpatient rehabilitation facility. Will admit to inpatient rehab today.  Preadmission Screen Completed By:  Stephania Fragmin, PT, DPT 09/19/2022 3:40 PM ______________________________________________________________________    Discussed status with Dr. Marland Kitchenon *** at *** and received approval for admission today.  Admission Coordinator:  Stephania Fragmin, PT, DPT time Marland KitchenDorna Bloom ***

## 2022-09-19 NOTE — Progress Notes (Signed)
PROGRESS NOTE    Grant WARRELL  XBJ:[id number] DOB: [birth year] DOA: 09/13/2022 PCP: Billie Lade, MD   Brief Narrative:  The patient is a 67 year old Caucasian male with a past medical history significant for but not limited to uncontrolled diabetes mellitus type 2, essential hypertension who presents with left foot pain and has a diabetic foot ulcer with osteomyelitis.  He presented to [facility] ED on 09/04/2022 complaining of a worsening left foot wound and underwent an MRI which is notable for a large skin wound at the lateral aspect concerning for abscess and osteomyelitis at that time.  He was recommended for admission however he subsequently left AMA from the ED and did not seek medical care until presenting to the [facility] ED.  Patient reports further progression of left foot discomfort along with worsening of the left lateral foot wound and progressive erythema.  Orthopedic surgery was consulted and they are planning on a transtibial amputation on Sunday.  Further workup also reveals that he has a gram-negative bacteremia with Proteus in the setting of his diabetic foot infection.  He remains on broad-spectrum antibiotics for now and will continue Abx after Surgical Intervention and have consulted ID for further Abx Management.   Assessment and Plan:  Severe Sepsis 2/2 to Acute Osteomyelitis of the Left Foot with Diabetic Foot Ulcer complicated by Proteus Bacteremia -Has had progressive left lateral foot discomfort associated with progressive erythema, swelling in the context of progressive diabetic foot wounds associated left lateral aspect -MRI done on 09/04/2022 showed bone marrow edema of the cuboid and the base of the fifth metatarsal base of the proximal phalanx of the digit which is concerning for osteomyelitis -Patient has progression of the left lateral foot ulcer in the setting of poorly controlled diabetes and diabetic neuropathy -Met sepsis criteria given that he is tachycardic,  tachypneic had a leukocytosis, fever and a lactic acid of 2.8 and elevated inflammatory markers; Was spiking Temperatures intermittently but now improved and in the last 24 hours had a Tmax of 98.6 -IVF now stopped  -Blood cultures x 2 showing gram-negative bacteremia with showing Proteus Mirabilis in 1 out of 4 bottles Culture  Setup Time GRAM NEGATIVE RODS IN BOTH AEROBIC AND ANAEROBIC BOTTLES Organism ID to follow CRITICAL RESULT CALLED TO, READ BACK BY AND VERIFIED WITHNorva Riffle PHARMD AT 1153 09/14/22 D. Leighton Roach Performed at [facility], [address], [place], [place] [address]  Culture PROTEUS MIRABILIS Abnormal   Report Status 09/16/2022 FINAL  Organism ID, Bacteria PROTEUS MIRABILIS  Resulting Agency [facility]     Susceptibility   Proteus mirabilis    MIC    AMPICILLIN <=2 SENSITIVE Sensitive    AMPICILLIN/SULBACTAM <=2 SENSITIVE Sensitive    CEFEPIME <=0.12 SENS... Sensitive    CEFTAZIDIME <=1 SENSITIVE Sensitive    CEFTRIAXONE <=0.25 SENS... Sensitive    CIPROFLOXACIN <=0.25 SENS... Sensitive    GENTAMICIN <=1 SENSITIVE Sensitive    IMIPENEM 4 SENSITIVE Sensitive    PIP/TAZO <=4 SENSITIVE Sensitive    TRIMETH/SULFA <=20 SENSIT... Sensitive              -WBC Trend: Recent Labs  Lab 09/14/22 0727 09/15/22 0229 09/16/22 0349 09/16/22 2202 09/17/22 0246 09/18/22 0258 09/19/22 0205  WBC 27.8* 26.8* 27.5* 23.2* 25.3* 24.3* 19.8*  -Culture of the left foot showed Morganella, Proteus and staph hemolyticus and Bacteroides -New Wound Cx showing:  Gram Stain ABUNDANT GRAM NEGATIVE RODS ABUNDANT GRAM POSITIVE COCCI FEW GRAM POSITIVE RODS FEW  WBC PRESENT, PREDOMINANTLY PMN  Culture MODERATE PROTEUS MIRABILIS CULTURE REINCUBATED FOR BETTER GROWTH HOLDING FOR POSSIBLE ANAEROBE Performed at [facility], [address] [place], [place] [address]  Report Status PENDING  Organism ID, Bacteria PROTEUS MIRABILIS  Resulting Agency [facility]      Susceptibility   Proteus mirabilis    MIC    AMPICILLIN <=2 SENSITIVE Sensitive    AMPICILLIN/SULBACTAM <=2 SENSITIVE Sensitive    CEFEPIME <=0.12 SENS... Sensitive    CEFTAZIDIME <=1 SENSITIVE Sensitive    CEFTRIAXONE <=0.25 SENS... Sensitive    CIPROFLOXACIN <=0.25 SENS... Sensitive    GENTAMICIN <=1 SENSITIVE Sensitive    IMIPENEM 2 SENSITIVE Sensitive    PIP/TAZO <=4 SENSITIVE Sensitive    TRIMETH/SULFA <=20 SENSIT... 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 Abx management per ID  Diarrhea -Likely Antibiotic Mediated and improved -Checking GI Pathogen Panel and if Negative will trail Imodium plan: GI pathogen panel got be discontinued now given that his diarrhea improved -Unlikely C Difficile and if not improving will consider obtaining C Difficile Testing  -Continue to Monitor and Trend  Hypoosmolar Hypovolemic Hyponatremia -Has suspected element of hypovolemia with suspected contribution from dehydration the sick clinical setting of sepsis and recent decline of oral intake Na+ Trend: Recent Labs  Lab 09/14/22 0727 09/15/22 0229 09/16/22 0349 09/16/22 2202 09/17/22 0246 09/18/22 0258 09/19/22 0205  NA 130* 127* 130* 131* 128* 130* 131*  -IVF now stopped -Replete with IV Sodium Phosphate 30 mmol yesterday  -Continue to Monitor and Trend and repeat CMP in the AM   Hypokalemia -Patient's K+ Level Trend: Recent Labs  Lab  09/14/22 0727 09/15/22 0229 09/16/22 0349 09/16/22 2202 09/17/22 0246 09/18/22 0258 09/19/22 0205  K 3.4* 3.8 3.5 3.3* 3.5 3.5 3.7  -Replete with IV Sodium Phos 30 mmol yesterday  -Continue to Monitor and Replete as Necessary -Repeat CMP in the AM   Vomiting -Unclear Etiology and only had 1 episode today -C/w Antiemetics and Supportive Care and if continues will consider CT of the Abd/Pelvis   Uncontrolled Diabetes Mellitus Type 2 -Include 70/30 insulin and is also on Mounjaro as an outpatient Last hemoglobin A1c was 14.3% and repeat this Visit is 9.6 -CBG and Glucose Trend: Recent Labs  Lab 09/14/22 0727 09/15/22 0229 09/16/22 0349 09/16/22 2202 09/17/22 0246 09/18/22 0258 09/19/22 0205  GLUCOSE 194* 221* 163* 193* 160* 193* 224*   Recent Labs  Lab 09/17/22 2348 09/18/22 0613 09/18/22 1206 09/18/22 1814 09/18/22 2347 09/19/22 0425 09/19/22 1139  GLUCAP 205* 170* 256* 192* 241* 217* 166*  -Continue with sensitive NovoLog/scale insulin AC for now -Consult diabetes education coordinator for further evaluation recommendations  HTN -Continue to Monitor BP per Protocol and Antihypertensives held due to Sever sepsis -Last BP reading was a little soft and is 103/73  AKI, improved Anion gap metabolic acidosis, improved  -BUN/Cr Trend: Recent Labs  Lab 09/14/22 0727 09/15/22 0229 09/16/22 0349 09/16/22 2202 09/17/22 0246 09/18/22 0258 09/19/22 0205  BUN 31* 17 11 9 8 16 16   CREATININE 0.88 0.71 0.80 0.78 0.72 0.82 0.73  -IVF now stopped -Anion gap metabolic acidosis improved and his CO2 is now 23, anion gap is 9, chloride level is 98 -Avoid Nephrotoxic Medications, Contrast Dyes, Hypotension and Dehydration to Ensure Adequate Renal Perfusion and will need to Renally Adjust Meds -Continue to Monitor and Trend Renal Function carefully and repeat CMP in the AM   Hypophosphatemia -Phos Level Trend: Recent Labs  Lab 09/15/22 0229 09/16/22 0349 09/17/22 0246  09/18/22 0258 09/19/22 0205  PHOS 1.9* 2.7 2.0* 2.9 2.9  -Replete with IV Sodium Phos 30 mmol  -Continue to Monitor and Replete as Necessary -Repeat Phos Level in the AM  Dehydration -In setting of above and IVF now stopped  Normocytic Anemia -Hgb/Hct Trend: Recent Labs  Lab 09/14/22 0727 09/15/22 0229 09/16/22 0349 09/16/22 2202 09/17/22 0246 09/18/22 0258 09/19/22 0205  HGB 10.1* 9.2* 10.1* 8.6* 8.7* 8.1* 8.6*  HCT 30.3* 26.5* 31.0* 26.6* 26.1* 25.6* 27.3*  MCV 76.7* 78.6* 80.7 78.7* 78.1* 80.3 81.7  -Checked Anemia Panel and it showed an iron level of 14, UIBC of 144, TIBC 158, saturation ratios of 9%, ferritin level of 1652, folate of 6.8, vitamin B12 level of 602 -Continue to Monitor for S/Sx of Bleeding; No overt Bleeding noted -Repeat CBC in the AM  Hypoalbuminemia -Patient's Albumin Trend: Recent Labs  Lab 09/14/22 0727 09/15/22 0229 09/16/22 0349 09/16/22 2202 09/17/22 0246 09/18/22 0258 09/19/22 0205  ALBUMIN 1.9* 1.5* 1.6* <1.5* <1.5* <1.5* <1.5*  -Continue to Monitor and Trend and repeat CMP in the AM   DVT prophylaxis: SCD's Start: 09/17/22 0927 SCDs Start: 09/13/22 2009    Code Status: Full Code Family Communication: No family present at bedside   Disposition Plan:  Level of care: Telemetry Medical Status is: Inpatient Remains inpatient appropriate because: Needs CIR and possible candidate. ID consulted for Abx Management    Consultants:  Orthopedic surgery Infectious diseases CIR  Procedures:  PROCEDURE:  AMPUTATION BELOW KNEE   Application of Kerecis micro graft 38 cm. Application 1 g vancomycin powder.   Application of Prevena customizable and Prevena arthroform wound VAC dressings. Application of Vive Wear stump shrinker and the Hanger limb protector   Tissue and purulence from the amputation site sent for culture  Antimicrobials:  Anti-infectives (From admission, onward)    Start     Dose/Rate Route Frequency Ordered Stop    09/17/22 0821  vancomycin (VANCOCIN) powder  Status:  Discontinued          As needed 09/17/22 0822 09/17/22 0840   09/17/22 0640  ceFAZolin (ANCEF) 2-4 GM/100ML-% IVPB       Note to Pharmacy: Aquilla Hacker M: cabinet override      09/17/22 0640 09/17/22 0756   09/17/22 0600  ceFAZolin (ANCEF) IVPB 2g/100 mL premix        2 g 200 mL/hr over 30 Minutes Intravenous On call to O.R. 09/16/22 1944 09/17/22 0820   09/14/22 2200  vancomycin (VANCOREADY) IVPB 1250 mg/250 mL  Status:  Discontinued        1,250 mg 166.7 mL/hr over 90 Minutes Intravenous Every 24 hours 09/14/22 1054 09/19/22 0859   09/14/22 1930  vancomycin (VANCOREADY) IVPB 1500 mg/300 mL  Status:  Discontinued        1,500 mg 150 mL/hr over 120 Minutes Intravenous Every 24 hours 09/13/22 2048 09/14/22 1054   09/14/22 1630  ceFEPIme (MAXIPIME) 2 g in sodium chloride 0.9 %  100 mL IVPB        2 g 200 mL/hr over 30 Minutes Intravenous Every 8 hours 09/14/22 1054     09/13/22 2030  ceFEPIme (MAXIPIME) 2 g in sodium chloride 0.9 % 100 mL IVPB  Status:  Discontinued        2 g 200 mL/hr over 30 Minutes Intravenous Every 12 hours 09/13/22 2015 09/14/22 1054   09/13/22 2030  metroNIDAZOLE (FLAGYL) IVPB 500 mg        500 mg 100 mL/hr over 60 Minutes Intravenous Every 12 hours 09/13/22 2015     09/13/22 1745  vancomycin (VANCOCIN) IVPB 1000 mg/200 mL premix  Status:  Discontinued        1,000 mg 200 mL/hr over 60 Minutes Intravenous  Once 09/13/22 1735 09/13/22 1742   09/13/22 1745  vancomycin (VANCOREADY) IVPB 1750 mg/350 mL        1,750 mg 175 mL/hr over 120 Minutes Intravenous  Once 09/13/22 1742 09/13/22 2105       Subjective: Seen and examined at bedside and continues to have pain in his leg.  States he vomited 1 time this morning states it was after his pills and after he drink Juven.  States that it was unusual for him just to vomit without provocation.  No pain or shortness breath.  No other concerns or complaints  time..  Objective: Vitals:   09/19/22 0429 09/19/22 0457 09/19/22 0747 09/19/22 1502  BP: 128/75  119/74 103/73  Pulse: 73  76 75  Resp: 19     Temp: 98.6 F (37 C)   (!) 97.3 F (36.3 C)  TempSrc:    Oral  SpO2: 98%  99% 97%  Weight:  92.5 kg    Height:        Intake/Output Summary (Last 24 hours) at 09/19/2022 1608 Last data filed at 09/19/2022 0857 Gross per 24 hour  Intake --  Output 1000 ml  Net -1000 ml   Filed Weights   09/17/22 0642 09/18/22 0611 09/19/22 0457  Weight: 88.9 kg 91.8 kg 92.5 kg   Examination: Physical Exam:  Constitutional: WN/WD overweight chronically ill-appearing Caucasian male in no acute distress Respiratory: Diminished to auscultation bilaterally, no wheezing, rales, rhonchi or crackles. Normal respiratory effort and patient is not tachypenic. No accessory muscle use.  Unlabored breathing Cardiovascular: RRR, no murmurs / rubs / gallops. S1 and S2 auscultated.  Has a left BKA now Abdomen: Soft, non-tender, distended secondary to body habitus. Bowel sounds positive.  GU: Deferred. Musculoskeletal: No clubbing / cyanosis of digits/nails.  Has a left BKA Skin: No rashes, lesions, ulcers. No induration; Warm and dry.  Neurologic: CN 2-12 grossly intact with no focal deficits. Romberg sign and cerebellar reflexes not assessed.  Psychiatric: Normal judgment and insight. Alert and oriented x 3. Normal mood and appropriate affect.   Data Reviewed: I have personally reviewed following labs and imaging studies  CBC: Recent Labs  Lab 09/16/22 0349 09/16/22 2202 09/17/22 0246 09/18/22 0258 09/19/22 0205  WBC 27.5* 23.2* 25.3* 24.3* 19.8*  NEUTROABS 22.4* 18.9* 22.3* 19.2* 15.3*  HGB 10.1* 8.6* 8.7* 8.1* 8.6*  HCT 31.0* 26.6* 26.1* 25.6* 27.3*  MCV 80.7 78.7* 78.1* 80.3 81.7  PLT 579* 611* 589* 588* 699*   Basic Metabolic Panel: Recent Labs  Lab 09/15/22 0229 09/16/22 0349 09/16/22 2202 09/17/22 0246 09/18/22 0258 09/19/22 0205  NA 127*  130* 131* 128* 130* 131*  K 3.8 3.5 3.3* 3.5 3.5 3.7  CL 97* 98 95*  97* 98 100  CO2 23 23 23 23 23 24   GLUCOSE 221* 163* 193* 160* 193* 224*  BUN 17 11 9 8 16 16   CREATININE 0.71 0.80 0.78 0.72 0.82 0.73  CALCIUM 8.3* 8.4* 8.3* 8.0* 8.0* 7.9*  MG 2.2 2.1  --  2.0 2.1 2.0  PHOS 1.9* 2.7  --  2.0* 2.9 2.9   GFR: Estimated Creatinine Clearance: 107 mL/min (by C-G formula based on SCr of 0.73 mg/dL). Liver Function Tests: Recent Labs  Lab 09/16/22 0349 09/16/22 2202 09/17/22 0246 09/18/22 0258 09/19/22 0205  AST 19 19 17 25 17   ALT 14 14 13 14 14   ALKPHOS 120 103 104 95 89  BILITOT 0.8 0.5 0.8 0.3 0.5  PROT 6.6 5.9* 6.0* 5.7* 6.2*  ALBUMIN 1.6* <1.5* <1.5* <1.5* <1.5*   No results for input(s): "LIPASE", "AMYLASE" in the last 168 hours. No results for input(s): "AMMONIA" in the last 168 hours. Coagulation Profile: Recent Labs  Lab 09/13/22 1849  INR 1.3*   Cardiac Enzymes: Recent Labs  Lab 09/14/22 0721  CKTOTAL 59   BNP (last 3 results) No results for input(s): "PROBNP" in the last 8760 hours. HbA1C: Recent Labs    09/16/22 1807  HGBA1C 9.8*   CBG: Recent Labs  Lab 09/18/22 1206 09/18/22 1814 09/18/22 2347 09/19/22 0425 09/19/22 1139  GLUCAP 256* 192* 241* 217* 166*   Lipid Profile: No results for input(s): "CHOL", "HDL", "LDLCALC", "TRIG", "CHOLHDL", "LDLDIRECT" in the last 72 hours. Thyroid Function Tests: No results for input(s): "TSH", "T4TOTAL", "FREET4", "T3FREE", "THYROIDAB" in the last 72 hours. Anemia Panel: No results for input(s): "VITAMINB12", "FOLATE", "FERRITIN", "TIBC", "IRON", "RETICCTPCT" in the last 72 hours. Sepsis Labs: Recent Labs  Lab 09/13/22 1855 09/13/22 1932  LATICACIDVEN 2.8* 1.8    Recent Results (from the past 240 hour(s))  Blood Culture (routine x 2)     Status: Abnormal   Collection Time: 09/13/22  7:20 PM   Specimen: BLOOD LEFT FOREARM  Result Value Ref Range Status   Specimen Description BLOOD LEFT FOREARM   Final   Special Requests   Final    BOTTLES DRAWN AEROBIC AND ANAEROBIC Blood Culture adequate volume   Culture  Setup Time   Final    GRAM NEGATIVE RODS IN BOTH AEROBIC AND ANAEROBIC BOTTLES Organism ID to follow CRITICAL RESULT CALLED TO, READ BACK BY AND VERIFIED WITHNorva Riffle PHARMD AT 1153 09/14/22 D. Leighton Roach Performed at [facility], [address], [place], [place] [address]    Culture PROTEUS MIRABILIS (A)  Final   Report Status 09/16/2022 FINAL  Final   Organism ID, Bacteria PROTEUS MIRABILIS  Final      Susceptibility   Proteus mirabilis - MIC*    AMPICILLIN <=2 SENSITIVE Sensitive     CEFEPIME <=0.12 SENSITIVE Sensitive     CEFTAZIDIME <=1 SENSITIVE Sensitive     CEFTRIAXONE <=0.25 SENSITIVE Sensitive     CIPROFLOXACIN <=0.25 SENSITIVE Sensitive     GENTAMICIN <=1 SENSITIVE Sensitive     IMIPENEM 4 SENSITIVE Sensitive     TRIMETH/SULFA <=20 SENSITIVE Sensitive     AMPICILLIN/SULBACTAM <=2 SENSITIVE Sensitive     PIP/TAZO <=4 SENSITIVE Sensitive     * PROTEUS MIRABILIS  Blood Culture ID Panel (Reflexed)     Status: Abnormal   Collection Time: 09/13/22  7:20 PM  Result Value Ref Range Status   Enterococcus faecalis NOT DETECTED NOT DETECTED Final   Enterococcus Faecium NOT DETECTED NOT DETECTED Final  Listeria monocytogenes NOT DETECTED NOT DETECTED Final   Staphylococcus species NOT DETECTED NOT DETECTED Final   Staphylococcus aureus (BCID) NOT DETECTED NOT DETECTED Final   Staphylococcus epidermidis NOT DETECTED NOT DETECTED Final   Staphylococcus lugdunensis NOT DETECTED NOT DETECTED Final   Streptococcus species NOT DETECTED NOT DETECTED Final   Streptococcus agalactiae NOT DETECTED NOT DETECTED Final   Streptococcus pneumoniae NOT DETECTED NOT DETECTED Final   Streptococcus pyogenes NOT DETECTED NOT DETECTED Final   A.calcoaceticus-baumannii NOT DETECTED NOT DETECTED Final   Bacteroides fragilis NOT DETECTED NOT DETECTED Final   Enterobacterales  DETECTED (A) NOT DETECTED Final    Comment: Enterobacterales represent a large order of gram negative bacteria, not a single organism. CRITICAL RESULT CALLED TO, READ BACK BY AND VERIFIED WITH: E. SINCLAIR PHARMD AT 1153 09/14/22 D. VANHOOK    Enterobacter cloacae complex NOT DETECTED NOT DETECTED Final   Escherichia coli NOT DETECTED NOT DETECTED Final   Klebsiella aerogenes NOT DETECTED NOT DETECTED Final   Klebsiella oxytoca NOT DETECTED NOT DETECTED Final   Klebsiella pneumoniae NOT DETECTED NOT DETECTED Final   Proteus species DETECTED (A) NOT DETECTED Final    Comment: CRITICAL RESULT CALLED TO, READ BACK BY AND VERIFIED WITH: E. SINCLAIR PHARMD AT 1153 09/14/22 D. VANHOOK    Salmonella species NOT DETECTED NOT DETECTED Final   Serratia marcescens NOT DETECTED NOT DETECTED Final   Haemophilus influenzae NOT DETECTED NOT DETECTED Final   Neisseria meningitidis NOT DETECTED NOT DETECTED Final   Pseudomonas aeruginosa NOT DETECTED NOT DETECTED Final   Stenotrophomonas maltophilia NOT DETECTED NOT DETECTED Final   Candida albicans NOT DETECTED NOT DETECTED Final   Candida auris NOT DETECTED NOT DETECTED Final   Candida glabrata NOT DETECTED NOT DETECTED Final   Candida krusei NOT DETECTED NOT DETECTED Final   Candida parapsilosis NOT DETECTED NOT DETECTED Final   Candida tropicalis NOT DETECTED NOT DETECTED Final   Cryptococcus neoformans/gattii NOT DETECTED NOT DETECTED Final   CTX-M ESBL NOT DETECTED NOT DETECTED Final   Carbapenem resistance IMP NOT DETECTED NOT DETECTED Final   Carbapenem resistance KPC NOT DETECTED NOT DETECTED Final   Carbapenem resistance NDM NOT DETECTED NOT DETECTED Final   Carbapenem resist OXA 48 LIKE NOT DETECTED NOT DETECTED Final   Carbapenem resistance VIM NOT DETECTED NOT DETECTED Final    Comment: Performed at [facility], [address], [place], [place] [address]  Blood Culture (routine x 2)     Status: Abnormal   Collection Time: 09/13/22   7:31 PM   Specimen: BLOOD  Result Value Ref Range Status   Specimen Description BLOOD LEFT ANTECUBITAL  Final   Special Requests   Final    BOTTLES DRAWN AEROBIC AND ANAEROBIC Blood Culture results may not be optimal due to an excessive volume of blood received in culture bottles   Culture  Setup Time   Final    GRAM NEGATIVE RODS ANAEROBIC BOTTLE ONLY CRITICAL RESULT CALLED TO, READ BACK BY AND VERIFIED WITH: PHARMD LISA CURRAN ON 09/15/22 @ 1809 BY DRT    Culture (A)  Final    PREVOTELLA BIVIA BETA LACTAMASE POSITIVE Performed at [facility], [address], [place], [place] [address]    Report Status 09/17/2022 FINAL  Final  Surgical pcr screen     Status: None   Collection Time: 09/16/22  4:50 PM   Specimen: Nasal Mucosa; Nasal Swab  Result Value Ref Range Status   MRSA, PCR NEGATIVE NEGATIVE Final  Staphylococcus aureus NEGATIVE NEGATIVE Final    Comment: (NOTE) The Xpert SA Assay (FDA approved for NASAL specimens in patients 27 years of age and older), is one component of a comprehensive surveillance program. It is not intended to diagnose infection nor to guide or monitor treatment. Performed at [facility], [address] [place], [place] [address]   Aerobic/Anaerobic Culture w Gram Stain (surgical/deep wound)     Status: None (Preliminary result)   Collection Time: 09/17/22  8:48 AM   Specimen: Soft Tissue, Other  Result Value Ref Range Status   Specimen Description TISSUE  Final   Special Requests left leg wound  Final   Gram Stain   Final    ABUNDANT GRAM NEGATIVE RODS ABUNDANT GRAM POSITIVE COCCI FEW GRAM POSITIVE RODS FEW WBC PRESENT, PREDOMINANTLY PMN    Culture   Final    MODERATE PROTEUS MIRABILIS CULTURE REINCUBATED FOR BETTER GROWTH HOLDING FOR POSSIBLE ANAEROBE Performed at [facility], [address], [place], [place] [address]    Report Status PENDING  Incomplete   Organism ID, Bacteria PROTEUS MIRABILIS  Final       Susceptibility   Proteus mirabilis - MIC*    AMPICILLIN <=2 SENSITIVE Sensitive     CEFEPIME <=0.12 SENSITIVE Sensitive     CEFTAZIDIME <=1 SENSITIVE Sensitive     CEFTRIAXONE <=0.25 SENSITIVE Sensitive     CIPROFLOXACIN <=0.25 SENSITIVE Sensitive     GENTAMICIN <=1 SENSITIVE Sensitive     IMIPENEM 2 SENSITIVE Sensitive     TRIMETH/SULFA <=20 SENSITIVE Sensitive     AMPICILLIN/SULBACTAM <=2 SENSITIVE Sensitive     PIP/TAZO <=4 SENSITIVE Sensitive     * MODERATE PROTEUS MIRABILIS    Radiology Studies: No results found.  Scheduled Meds:  vitamin C  1,000 mg Oral Daily   docusate sodium  100 mg Oral Daily   insulin aspart  0-9 Units Subcutaneous Q6H   nutrition supplement (JUVEN)  1 packet Oral BID BM   pantoprazole  40 mg Oral Daily   zinc sulfate  220 mg Oral Daily   Continuous Infusions:  sodium chloride     ceFEPime (MAXIPIME) IV 2 g (09/19/22 0731)   magnesium sulfate bolus IVPB     metronidazole 500 mg (09/19/22 1013)    LOS: 6 days   Marguerita Merles, DO [facility] Available via Epic secure chat 7am-7pm After these hours, please refer to coverage provider listed on [email or web address] 09/19/2022, 4:08 PM

## 2022-09-19 NOTE — Consult Note (Signed)
   [facility] CM Inpatient Consult   09/19/2022  Grant Yang 02-Dec-1955 [id number]  [facility] [[facility]]  Accountable Care Organization [ACO] Patient: [name] Medicare  Primary Care Provider:  Billie Lade, MD [facility] is listed to provide the transition of care follow up.  Patient screened for less than 30 day readmission hospitalization with noted high risk score for unplanned readmission risk with a 5 day length of stay and to assess for potential [facility]  [[facility]] Care Management service needs for post hospital transition for care coordination.  Review of patient's electronic medical record reveals patient is a recent Amputation Below the Knee. 15:30 pm Rounds: Patient lived alone PTA and was currently being evaluated by PT.  Plan:  Continue to follow progress and disposition to assess for post hospital community care coordination/management needs.  Referral request for community care coordination: disposition to be determine.  Of note, [facility] Care Management/Population Health does not replace or interfere with any arrangements made by the Inpatient Transition of Care team.  For questions contact:   Charlesetta Shanks, RN BSN CCM [facility]  [phone] business mobile phone Toll free office [phone]  *Concierge Line  [phone] Fax number: [phone] [email or web address]

## 2022-09-19 NOTE — Plan of Care (Signed)
  Problem: Metabolic: Goal: Ability to maintain appropriate glucose levels will improve Outcome: Progressing   Problem: Nutritional: Goal: Maintenance of adequate nutrition will improve Outcome: Progressing Goal: Progress toward achieving an optimal weight will improve Outcome: Progressing   Problem: Fluid Volume: Goal: Ability to maintain a balanced intake and output will improve Outcome: Progressing   Problem: Coping: Goal: Ability to adjust to condition or change in health will improve Outcome: Progressing   Problem: Skin Integrity: Goal: Risk for impaired skin integrity will decrease Outcome: Progressing

## 2022-09-20 DIAGNOSIS — M869 Osteomyelitis, unspecified: Secondary | ICD-10-CM | POA: Diagnosis not present

## 2022-09-20 DIAGNOSIS — B9689 Other specified bacterial agents as the cause of diseases classified elsewhere: Secondary | ICD-10-CM | POA: Diagnosis not present

## 2022-09-20 DIAGNOSIS — Z89612 Acquired absence of left leg above knee: Secondary | ICD-10-CM | POA: Diagnosis not present

## 2022-09-20 DIAGNOSIS — B964 Proteus (mirabilis) (morganii) as the cause of diseases classified elsewhere: Secondary | ICD-10-CM | POA: Diagnosis not present

## 2022-09-20 DIAGNOSIS — M86172 Other acute osteomyelitis, left ankle and foot: Secondary | ICD-10-CM | POA: Diagnosis not present

## 2022-09-20 DIAGNOSIS — N19 Unspecified kidney failure: Secondary | ICD-10-CM | POA: Diagnosis not present

## 2022-09-20 DIAGNOSIS — E871 Hypo-osmolality and hyponatremia: Secondary | ICD-10-CM | POA: Diagnosis not present

## 2022-09-20 DIAGNOSIS — E1169 Type 2 diabetes mellitus with other specified complication: Secondary | ICD-10-CM | POA: Diagnosis not present

## 2022-09-20 LAB — GLUCOSE, CAPILLARY
Glucose-Capillary: 153 mg/dL — ABNORMAL HIGH (ref 70–99)
Glucose-Capillary: 264 mg/dL — ABNORMAL HIGH (ref 70–99)
Glucose-Capillary: 226 mg/dL — ABNORMAL HIGH (ref 70–99)

## 2022-09-20 LAB — OSMOLALITY, URINE: Osmolality, Ur: 588 mOsm/kg (ref 300–900)

## 2022-09-20 MED ORDER — AMOXICILLIN-POT CLAVULANATE 875-125 MG PO TABS
1.0000 | ORAL_TABLET | Freq: Two times a day (BID) | ORAL | Status: DC
  Administered 2022-09-20 – 2022-09-25 (×11): 1 via ORAL
  Filled 2022-09-20 (×11): qty 1

## 2022-09-20 NOTE — Plan of Care (Signed)
  Problem: Coping: Goal: Ability to adjust to condition or change in health will improve Outcome: Progressing   Problem: Fluid Volume: Goal: Ability to maintain a balanced intake and output will improve Outcome: Progressing   Problem: Health Behavior/Discharge Planning: Goal: Ability to identify and utilize available resources and services will improve Outcome: Progressing Goal: Ability to manage health-related needs will improve Outcome: Progressing   Problem: Metabolic: Goal: Ability to maintain appropriate glucose levels will improve Outcome: Progressing   Problem: Nutritional: Goal: Maintenance of adequate nutrition will improve Outcome: Progressing   Problem: Skin Integrity: Goal: Risk for impaired skin integrity will decrease Outcome: Progressing   Problem: Tissue Perfusion: Goal: Adequacy of tissue perfusion will improve Outcome: Progressing   

## 2022-09-20 NOTE — Progress Notes (Signed)
Physical Therapy Treatment Patient Details Name: Grant Yang MRN: [id number] DOB: 1955/05/28 Today's Date: 09/20/2022   History of Present Illness Admitted with L LE infection necesitating transtibial amputation;  has a past medical history of Cancer (HCC), Diabetes mellitus, GERD (gastroesophageal reflux disease), History of kidney stones, Hypertension, Neuropathy in diabetes (HCC), Shortness of breath, and Stroke (HCC) (2010).    PT Comments  Pt supine in bed on arrival.  He wants a bath.  NT entered during session and he was able to stand for 3 min during pericare and bathing.  He required holding to RW to maintain balance with intermittent removal of one hand to assist in ADLs.  Pt performed increased trial of gt training this session with close chair follow.  Continue to recommend rehab in a post acute setting at this time.      If plan is discharge home, recommend the following: A little help with walking and/or transfers;A little help with bathing/dressing/bathroom;Assistance with cooking/housework;Assist for transportation   Can travel by private vehicle        Equipment Recommendations  BSC/3in1;Wheelchair (measurements PT);Wheelchair cushion (measurements PT)    Recommendations for Other Services       Precautions / Restrictions Precautions Precautions: Fall;Other (comment) Precaution Comments: Enteric Precautions Restrictions Weight Bearing Restrictions: No     Mobility  Bed Mobility Overal bed mobility: Modified Independent Bed Mobility: Supine to Sit     Supine to sit: Supervision     General bed mobility comments: Increased time and management of wound vac leads.    Transfers Overall transfer level: Needs assistance Equipment used: Rolling walker (2 wheels) Transfers: Sit to/from Stand Sit to Stand: Min guard           General transfer comment: Cues for hand placement and transition to and from RW.    Ambulation/Gait Ambulation/Gait assistance:  Min assist Gait Distance (Feet): 5 Feet (+ 12 ft) Assistive device: Rolling walker (2 wheels) Gait Pattern/deviations: Step-to pattern, Trunk flexed       General Gait Details: Cues for sequencing and safety.  Pt nervous to increased gt distance.  Educated to push RW forward vs. pickup and setting down which hinders his balance.  Close chair follow this session.   Stairs             Wheelchair Mobility     Tilt Bed    Modified Rankin (Stroke Patients Only)       Balance Overall balance assessment: Needs assistance Sitting-balance support: Feet supported Sitting balance-Leahy Scale: Good       Standing balance-Leahy Scale: Poor Standing balance comment: reliant on RW for support                            Cognition Arousal/Alertness: Awake/alert Behavior During Therapy: WFL for tasks assessed/performed Overall Cognitive Status: Within Functional Limits for tasks assessed                                          Exercises      General Comments        Pertinent Vitals/Pain Pain Assessment Pain Assessment: 0-10 Pain Score: 6  Pain Location: scotum and L residual limb Pain Descriptors / Indicators: Throbbing, Burning Pain Intervention(s): Monitored during session, Repositioned    Home Living  Prior Function            PT Goals (current goals can now be found in the care plan section) Acute Rehab PT Goals Patient Stated Goal: Wants to get a prosthesis Potential to Achieve Goals: Good Additional Goals Additional Goal #1: Pt will deomonstrate proficiency in wheelchair propulsion and parts management with modified independence Progress towards PT goals: Progressing toward goals    Frequency    Min 1X/week      PT Plan Current plan remains appropriate    Co-evaluation              AM-PAC PT "6 Clicks" Mobility   Outcome Measure  Help needed turning from your back to  your side while in a flat bed without using bedrails?: None Help needed moving from lying on your back to sitting on the side of a flat bed without using bedrails?: A Little Help needed moving to and from a bed to a chair (including a wheelchair)?: A Little Help needed standing up from a chair using your arms (e.g., wheelchair or bedside chair)?: A Little Help needed to walk in hospital room?: A Little Help needed climbing 3-5 steps with a railing? : A Little 6 Click Score: 19    End of Session Equipment Utilized During Treatment: Gait belt Activity Tolerance: Patient tolerated treatment well Patient left: with call bell/phone within reach;in chair;with chair alarm set Nurse Communication: Mobility status PT Visit Diagnosis: Unsteadiness on feet (R26.81);Other abnormalities of gait and mobility (R26.89)     Time: 2202-5427 PT Time Calculation (min) (ACUTE ONLY): 23 min  Charges:    $Gait Training: 8-22 mins $Therapeutic Activity: 8-22 mins PT General Charges $$ ACUTE PT VISIT: 1 Visit                     Bonney Leitz , PTA Acute Rehabilitation Services Office [phone]    Florestine Avers 09/20/2022, 2:37 PM

## 2022-09-20 NOTE — Progress Notes (Signed)
[facility] Infectious Diseases Follow Up Note  Patient Identification: Patient Name: Grant Yang MRN: [id number] Admit Date: 09/13/2022  5:06 PM Age: 67 y.o.Today's Date: 09/20/2022   Reason for Visit: wound infection   Principal Problem:   Osteomyelitis of left lower extremity (HCC) Active Problems:   Acute hyponatremia   Essential hypertension   Severe sepsis (HCC)   Penetrating foot wound, left, initial encounter   AKI (acute kidney injury) (HCC)   High anion gap metabolic acidosis   Dehydration   Acute prerenal azotemia   DM2 (diabetes mellitus, type 2) (HCC)   Severe protein-calorie malnutrition (HCC)  Antibiotics:  Cefepime and metronidazole 7/24-c Vancomycin 7/24-7/29   Lines/Hardware:  Interval Events:  Assessment 67 YO male with prior h/o prostate cancer s/p prostatectomy/lymphadenectomy, GERD, kidney stones, CVA, uncontrolled DM2 w peripheral neuropathy, PVD s/p vascular intervention in 08/03/2022, HTN 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  Patient had purulence that extended up to the amputation site.  The muscle was healthy and viable no necrotic tissue or fascia. There is no infection proximal to the amputation site Tissue and purulence from the amputation site sent for cultures. Cx Proteus mirabilis. Per Dr Lajoyce Corners, no concerns for remaining osteomyelitis. Plan to DC wound vac on discharge.   # Prevotella bivia/Proteus mirbailis bacteremia 2/2 above  # Dairrhea - resolved   Recommendations Okay to switch to p.o. Augmentin to complete 4 weeks of duration from 7/28.  EOT 8/24 Monitor CBC and CMP on antibiotics Patient has a follow-up appointment on 8/14 at 11: 30 am  Follow-up with orthopedics as instructed Follow-up for cultures to completion peripherally, otherwise, ID will so.   Rest of the management as per the primary team. Thank you for  the consult. Please page with pertinent questions or concerns.  ______________________________________________________________________ Subjective patient seen and examined at the bedside.  Diarrhea is resolved.  No concerns otherwise  Vitals BP 136/67 (BP Location: Left Arm)   Pulse 71   Temp 97.7 F (36.5 C) (Oral)   Resp 18   Ht 5' 10.98" (1.803 m)   Wt 92 kg   SpO2 98%   BMI 28.30 kg/m      Physical Exam Constitutional: Elderly male lying in the bed and appears comfortable    Comments:   Cardiovascular:     Rate and Rhythm: Normal rate and regular rhythm.     Heart sounds:   Pulmonary:     Effort: Pulmonary effort is normal.     Comments:   Abdominal:     Palpations: Abdomen is soft.     Tenderness: Nondistended  Musculoskeletal:        General: No swelling or tenderness in peripheral joints.  Left BKA wound is bandaged C/D/I. Wound vac with minimal draiange  Skin:    Comments: No rashes  Neurological:     General: awake, alert and oriented, following commands   Psychiatric:        Mood and Affect: Mood normal.   Pertinent Microbiology Results for orders placed or performed during the hospital encounter of 09/13/22  Blood Culture (routine x 2)     Status: Abnormal   Collection Time: 09/13/22  7:20 PM   Specimen: BLOOD LEFT FOREARM  Result Value Ref Range Status   Specimen Description BLOOD LEFT FOREARM  Final   Special Requests   Final    BOTTLES DRAWN AEROBIC AND ANAEROBIC Blood Culture adequate volume  Culture  Setup Time   Final    GRAM NEGATIVE RODS IN BOTH AEROBIC AND ANAEROBIC BOTTLES Organism ID to follow CRITICAL RESULT CALLED TO, READ BACK BY AND VERIFIED WITHNorva Riffle PHARMD AT 1153 09/14/22 D. Leighton Roach Performed at [facility], [address] [place], [place] [address]    Culture PROTEUS MIRABILIS (A)  Final   Report Status 09/16/2022 FINAL  Final   Organism ID, Bacteria PROTEUS MIRABILIS  Final      Susceptibility   Proteus  mirabilis - MIC*    AMPICILLIN <=2 SENSITIVE Sensitive     CEFEPIME <=0.12 SENSITIVE Sensitive     CEFTAZIDIME <=1 SENSITIVE Sensitive     CEFTRIAXONE <=0.25 SENSITIVE Sensitive     CIPROFLOXACIN <=0.25 SENSITIVE Sensitive     GENTAMICIN <=1 SENSITIVE Sensitive     IMIPENEM 4 SENSITIVE Sensitive     TRIMETH/SULFA <=20 SENSITIVE Sensitive     AMPICILLIN/SULBACTAM <=2 SENSITIVE Sensitive     PIP/TAZO <=4 SENSITIVE Sensitive     * PROTEUS MIRABILIS  Blood Culture ID Panel (Reflexed)     Status: Abnormal   Collection Time: 09/13/22  7:20 PM  Result Value Ref Range Status   Enterococcus faecalis NOT DETECTED NOT DETECTED Final   Enterococcus Faecium NOT DETECTED NOT DETECTED Final   Listeria monocytogenes NOT DETECTED NOT DETECTED Final   Staphylococcus species NOT DETECTED NOT DETECTED Final   Staphylococcus aureus (BCID) NOT DETECTED NOT DETECTED Final   Staphylococcus epidermidis NOT DETECTED NOT DETECTED Final   Staphylococcus lugdunensis NOT DETECTED NOT DETECTED Final   Streptococcus species NOT DETECTED NOT DETECTED Final   Streptococcus agalactiae NOT DETECTED NOT DETECTED Final   Streptococcus pneumoniae NOT DETECTED NOT DETECTED Final   Streptococcus pyogenes NOT DETECTED NOT DETECTED Final   A.calcoaceticus-baumannii NOT DETECTED NOT DETECTED Final   Bacteroides fragilis NOT DETECTED NOT DETECTED Final   Enterobacterales DETECTED (A) NOT DETECTED Final    Comment: Enterobacterales represent a large order of gram negative bacteria, not a single organism. CRITICAL RESULT CALLED TO, READ BACK BY AND VERIFIED WITH: E. SINCLAIR PHARMD AT 1153 09/14/22 D. VANHOOK    Enterobacter cloacae complex NOT DETECTED NOT DETECTED Final   Escherichia coli NOT DETECTED NOT DETECTED Final   Klebsiella aerogenes NOT DETECTED NOT DETECTED Final   Klebsiella oxytoca NOT DETECTED NOT DETECTED Final   Klebsiella pneumoniae NOT DETECTED NOT DETECTED Final   Proteus species DETECTED (A) NOT DETECTED  Final    Comment: CRITICAL RESULT CALLED TO, READ BACK BY AND VERIFIED WITH: E. SINCLAIR PHARMD AT 1153 09/14/22 D. VANHOOK    Salmonella species NOT DETECTED NOT DETECTED Final   Serratia marcescens NOT DETECTED NOT DETECTED Final   Haemophilus influenzae NOT DETECTED NOT DETECTED Final   Neisseria meningitidis NOT DETECTED NOT DETECTED Final   Pseudomonas aeruginosa NOT DETECTED NOT DETECTED Final   Stenotrophomonas maltophilia NOT DETECTED NOT DETECTED Final   Candida albicans NOT DETECTED NOT DETECTED Final   Candida auris NOT DETECTED NOT DETECTED Final   Candida glabrata NOT DETECTED NOT DETECTED Final   Candida krusei NOT DETECTED NOT DETECTED Final   Candida parapsilosis NOT DETECTED NOT DETECTED Final   Candida tropicalis NOT DETECTED NOT DETECTED Final   Cryptococcus neoformans/gattii NOT DETECTED NOT DETECTED Final   CTX-M ESBL NOT DETECTED NOT DETECTED Final   Carbapenem resistance IMP NOT DETECTED NOT DETECTED Final   Carbapenem resistance KPC NOT DETECTED NOT DETECTED Final   Carbapenem resistance NDM NOT DETECTED NOT DETECTED  Final   Carbapenem resist OXA 48 LIKE NOT DETECTED NOT DETECTED Final   Carbapenem resistance VIM NOT DETECTED NOT DETECTED Final    Comment: Performed at [facility], [address] [place], [place] [address]  Blood Culture (routine x 2)     Status: Abnormal   Collection Time: 09/13/22  7:31 PM   Specimen: BLOOD  Result Value Ref Range Status   Specimen Description BLOOD LEFT ANTECUBITAL  Final   Special Requests   Final    BOTTLES DRAWN AEROBIC AND ANAEROBIC Blood Culture results may not be optimal due to an excessive volume of blood received in culture bottles   Culture  Setup Time   Final    GRAM NEGATIVE RODS ANAEROBIC BOTTLE ONLY CRITICAL RESULT CALLED TO, READ BACK BY AND VERIFIED WITH: PHARMD LISA CURRAN ON 09/15/22 @ 1809 BY DRT    Culture (A)  Final    PREVOTELLA BIVIA BETA LACTAMASE POSITIVE Performed at [facility], [address], [place], [place] [address]    Report Status 09/17/2022 FINAL  Final  Surgical pcr screen     Status: None   Collection Time: 09/16/22  4:50 PM   Specimen: Nasal Mucosa; Nasal Swab  Result Value Ref Range Status   MRSA, PCR NEGATIVE NEGATIVE Final   Staphylococcus aureus NEGATIVE NEGATIVE Final    Comment: (NOTE) The Xpert SA Assay (FDA approved for NASAL specimens in patients 3 years of age and older), is one component of a comprehensive surveillance program. It is not intended to diagnose infection nor to guide or monitor treatment. Performed at [facility], [address] [place], [place] [address]   Aerobic/Anaerobic Culture w Gram Stain (surgical/deep wound)     Status: None (Preliminary result)   Collection Time: 09/17/22  8:48 AM   Specimen: Soft Tissue, Other  Result Value Ref Range Status   Specimen Description TISSUE  Final   Special Requests left leg wound  Final   Gram Stain   Final    ABUNDANT GRAM NEGATIVE RODS ABUNDANT GRAM POSITIVE COCCI FEW GRAM POSITIVE RODS FEW WBC PRESENT, PREDOMINANTLY PMN    Culture   Final    MODERATE PROTEUS MIRABILIS CULTURE REINCUBATED FOR BETTER GROWTH HOLDING FOR POSSIBLE ANAEROBE Performed at [facility], [address], [place], [place] [address]    Report Status PENDING  Incomplete   Organism ID, Bacteria PROTEUS MIRABILIS  Final      Susceptibility   Proteus mirabilis - MIC*    AMPICILLIN <=2 SENSITIVE Sensitive     CEFEPIME <=0.12 SENSITIVE Sensitive     CEFTAZIDIME <=1 SENSITIVE Sensitive     CEFTRIAXONE <=0.25 SENSITIVE Sensitive     CIPROFLOXACIN <=0.25 SENSITIVE Sensitive     GENTAMICIN <=1 SENSITIVE Sensitive     IMIPENEM 2 SENSITIVE Sensitive     TRIMETH/SULFA <=20 SENSITIVE Sensitive     AMPICILLIN/SULBACTAM <=2 SENSITIVE Sensitive     PIP/TAZO <=4 SENSITIVE Sensitive     * MODERATE PROTEUS MIRABILIS   Pertinent Lab.    Latest Ref Rng & Units 09/19/2022    2:05 AM 09/18/2022     2:58 AM 09/17/2022    2:46 AM  CBC  WBC 4.0 - 10.5 K/uL 19.8  24.3  25.3   Hemoglobin 13.0 - 17.0 g/dL 8.6  8.1  8.7   Hematocrit 39.0 - 52.0 % 27.3  25.6  26.1   Platelets 150 - 400 K/uL 699  588  589  Latest Ref Rng & Units 09/19/2022    2:05 AM 09/18/2022    2:58 AM 09/17/2022    2:46 AM  CMP  Glucose 70 - 99 mg/dL 782  956  213   BUN 8 - 23 mg/dL 16  16  8    Creatinine 0.61 - 1.24 mg/dL 0.86  5.78  4.69   Sodium 135 - 145 mmol/L 131  130  128   Potassium 3.5 - 5.1 mmol/L 3.7  3.5  3.5   Chloride 98 - 111 mmol/L 100  98  97   CO2 22 - 32 mmol/L 24  23  23    Calcium 8.9 - 10.3 mg/dL 7.9  8.0  8.0   Total Protein 6.5 - 8.1 g/dL 6.2  5.7  6.0   Total Bilirubin 0.3 - 1.2 mg/dL 0.5  0.3  0.8   Alkaline Phos 38 - 126 U/L 89  95  104   AST 15 - 41 U/L 17  25  17    ALT 0 - 44 U/L 14  14  13      Pertinent Imaging today Plain films and CT images have been personally visualized and interpreted; radiology reports have been reviewed. Decision making incorporated into the Impression No results found.   I have personally spent 51 minutes involved in face-to-face and non-face-to-face activities for this patient on the day of the visit. Professional time spent includes the following activities: Preparing to see the patient (review of tests), Obtaining and/or reviewing separately obtained history (admission/discharge record), Performing a medically appropriate examination and/or evaluation , Ordering medications/tests/procedures, referring and communicating with other health care professionals, Documenting clinical information in the EMR, Independently interpreting results (not separately reported), Communicating results to the patient/family/caregiver, Counseling and educating the patient/family/caregiver and Care coordination (not separately reported).   Plan d/w requesting provider as well as ID pharm D  Note: This document was prepared using dragon voice recognition software and may  include unintentional dictation errors.   Electronically signed by:   Odette Fraction, MD Infectious Disease Physician [facility] for Infectious Disease Pager: [phone]

## 2022-09-20 NOTE — Progress Notes (Signed)
Inpatient Rehab Admissions Coordinator:   Awaiting determination from insurance regarding CIR prior auth request.  Will follow.   Estill Dooms, PT, DPT Admissions Coordinator [phone] 09/20/22  11:09 AM

## 2022-09-20 NOTE — Progress Notes (Signed)
Urine sample collected and sent to lab.

## 2022-09-20 NOTE — Progress Notes (Signed)
[facility]  PROGRESS NOTE  Grant Yang UEA:[id number] DOB: [birth year] DOA: 09/13/2022 PCP: Billie Lade, MD   Brief HPI:   67 year old Caucasian male with a past medical history significant for but not limited to uncontrolled diabetes mellitus type 2, essential hypertension who presents with left foot pain and has a diabetic foot ulcer with osteomyelitis. He presented to [facility] ED on 09/04/2022 complaining of a worsening left foot wound and underwent an MRI which is notable for a large skin wound at the lateral aspect concerning for abscess and osteomyelitis at that time. He was recommended for admission however he subsequently left AMA from the ED and did not seek medical care until presenting to the [facility] ED. Patient reports further progression of left foot discomfort along with worsening of the left lateral foot wound and progressive erythema. Orthopedic surgery was consulted and they are planning on a transtibial amputation on Sunday. Further workup also reveals that he has a gram-negative bacteremia with Proteus in the setting of his diabetic foot infection. 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 Augmentin to complete 4 weeks of duration from 7/28 end of treatment 8/24 -Plan to discharge to CIR  Diarrhea -Improved, likely antibiotic  induced  Hyponatremia -Serum osmolality 288 -Check urine osmolality   Diabetes mellitus type 2 -Continue sliding scale insulin with NovoLog -CBG well-controlled  Hypertension -Blood pressure is stable  Hypophosphatemia -Replete  Normocytic anemia -Iron saturation 9%, B12 602 -Likely in setting of above  Hypoalbuminemia -Will consult nutrition   Medications     amoxicillin-clavulanate  1 tablet Oral Q12H   vitamin C  1,000 mg Oral Daily   docusate sodium  100 mg Oral Daily   insulin aspart  0-9 Units Subcutaneous Q6H   nutrition supplement (JUVEN)  1 packet Oral BID BM   pantoprazole  40 mg Oral Daily   zinc sulfate  220 mg Oral Daily     Data Reviewed:   CBG:  Recent Labs  Lab 09/19/22 1729 09/19/22 2332 09/20/22 0734 09/20/22 1209 09/20/22 1745  GLUCAP 163* 183* 153* 264* 226*    SpO2: 100 %    Vitals:   09/20/22 0253 09/20/22 0435 09/20/22 0733 09/20/22 1426  BP:  110/72 136/67 126/63  Pulse:  80 71 75  Resp:  18    Temp:  98.5 F (36.9 C) 97.7 F (36.5 C) 97.6 F (36.4 C)  TempSrc:  Oral Oral Oral  SpO2:  100% 98% 100%  Weight: 92 kg     Height:          Data Reviewed:  Basic Metabolic Panel: Recent Labs  Lab 09/15/22 0229 09/16/22 0349 09/16/22 2202 09/17/22 0246 09/18/22 0258 09/19/22 0205  NA 127* 130* 131* 128* 130* 131*  K 3.8 3.5 3.3* 3.5 3.5 3.7  CL 97* 98 95* 97* 98 100  CO2 23 23 23 23 23 24   GLUCOSE 221* 163* 193* 160*  193* 224*  BUN 17 11 9 8 16 16   CREATININE 0.71 0.80 0.78 0.72 0.82 0.73  CALCIUM 8.3* 8.4* 8.3* 8.0* 8.0* 7.9*  MG 2.2 2.1  --  2.0 2.1 2.0  PHOS 1.9* 2.7  --  2.0* 2.9 2.9    CBC: Recent Labs  Lab 09/16/22 0349 09/16/22 2202 09/17/22 0246 09/18/22 0258 09/19/22 0205  WBC 27.5* 23.2* 25.3* 24.3* 19.8*  NEUTROABS 22.4* 18.9* 22.3* 19.2* 15.3*  HGB 10.1* 8.6* 8.7* 8.1* 8.6*  HCT 31.0* 26.6* 26.1* 25.6* 27.3*  MCV 80.7 78.7* 78.1* 80.3 81.7  PLT 579* 611* 589* 588* 699*     LFT Recent Labs  Lab 09/16/22 0349 09/16/22 2202 09/17/22 0246 09/18/22 0258 09/19/22 0205  AST 19 19 17 25 17   ALT 14 14 13 14 14   ALKPHOS 120 103 104 95 89  BILITOT 0.8 0.5 0.8 0.3 0.5  PROT 6.6 5.9* 6.0* 5.7* 6.2*  ALBUMIN 1.6* <1.5* <1.5* <1.5* <1.5*     Antibiotics: Anti-infectives (From admission, onward)    Start     Dose/Rate Route Frequency Ordered Stop   09/20/22 1000  amoxicillin-clavulanate (AUGMENTIN) 875-125 MG per tablet 1 tablet        1 tablet Oral Every 12 hours 09/20/22 0911 10/18/22 0959   09/17/22 0821  vancomycin (VANCOCIN) powder  Status:  Discontinued          As needed 09/17/22 0822 09/17/22 0840   09/17/22 0640  ceFAZolin (ANCEF) 2-4 GM/100ML-% IVPB       Note to Pharmacy: Aquilla Hacker M: cabinet override      09/17/22 0640 09/17/22 0756   09/17/22 0600  ceFAZolin (ANCEF) IVPB 2g/100 mL premix        2 g 200 mL/hr over 30 Minutes Intravenous On call to O.R. 09/16/22 1944 09/17/22 0820   09/14/22 2200  vancomycin (VANCOREADY) IVPB 1250 mg/250 mL  Status:  Discontinued        1,250 mg 166.7 mL/hr over 90 Minutes Intravenous Every 24 hours 09/14/22 1054 09/19/22 0859   09/14/22 1930  vancomycin (VANCOREADY) IVPB 1500 mg/300 mL  Status:  Discontinued        1,500 mg 150 mL/hr over 120 Minutes Intravenous Every 24 hours 09/13/22 2048 09/14/22 1054   09/14/22 1630  ceFEPIme (MAXIPIME) 2 g in sodium chloride 0.9 % 100 mL IVPB  Status:  Discontinued        2 g 200 mL/hr over 30 Minutes Intravenous Every 8 hours 09/14/22 1054 09/20/22 0911   09/13/22 2030  ceFEPIme (MAXIPIME) 2 g in sodium chloride 0.9 % 100 mL IVPB  Status:  Discontinued        2 g 200 mL/hr over 30 Minutes Intravenous Every 12 hours 09/13/22 2015 09/14/22 1054   09/13/22 2030  metroNIDAZOLE (FLAGYL) IVPB 500 mg  Status:  Discontinued        500 mg 100 mL/hr over 60 Minutes Intravenous Every 12 hours 09/13/22 2015 09/20/22 0911   09/13/22 1745  vancomycin (VANCOCIN) IVPB 1000  mg/200 mL premix  Status:  Discontinued        1,000 mg 200 mL/hr over 60 Minutes Intravenous  Once 09/13/22 1735 09/13/22 1742   09/13/22 1745  vancomycin (VANCOREADY) IVPB 1750 mg/350 mL        1,750 mg 175 mL/hr over 120 Minutes Intravenous  Once 09/13/22 1742 09/13/22 2105        DVT prophylaxis: SCDs  Code Status: Full code  Family Communication: No family at  bedside   CONSULTS orthopedics   Subjective   Denies pain   Objective    Physical Examination:   General-appears in no acute distress Heart-S1-S2, regular, no murmur auscultated Lungs-clear to auscultation bilaterally, no wheezing or crackles auscultated Abdomen-soft, nontender, no organomegaly Extremities-status post left BKA Neuro-alert, oriented x3, no focal deficit noted   Status is: Inpatient:             Meredeth Ide   [facility] If 7PM-7AM, please contact night-coverage at [email or web address], Office  [phone]   09/20/2022, 6:55 PM  LOS: 7 days

## 2022-09-21 ENCOUNTER — Other Ambulatory Visit (HOSPITAL_COMMUNITY): Payer: Self-pay

## 2022-09-21 DIAGNOSIS — M869 Osteomyelitis, unspecified: Secondary | ICD-10-CM | POA: Diagnosis not present

## 2022-09-21 DIAGNOSIS — Z89612 Acquired absence of left leg above knee: Secondary | ICD-10-CM | POA: Diagnosis not present

## 2022-09-21 DIAGNOSIS — N19 Unspecified kidney failure: Secondary | ICD-10-CM | POA: Diagnosis not present

## 2022-09-21 DIAGNOSIS — E871 Hypo-osmolality and hyponatremia: Secondary | ICD-10-CM | POA: Diagnosis not present

## 2022-09-21 LAB — GLUCOSE, CAPILLARY
Glucose-Capillary: 162 mg/dL — ABNORMAL HIGH (ref 70–99)
Glucose-Capillary: 189 mg/dL — ABNORMAL HIGH (ref 70–99)
Glucose-Capillary: 200 mg/dL — ABNORMAL HIGH (ref 70–99)
Glucose-Capillary: 213 mg/dL — ABNORMAL HIGH (ref 70–99)
Glucose-Capillary: 215 mg/dL — ABNORMAL HIGH (ref 70–99)

## 2022-09-21 MED ORDER — INSULIN ASPART PROT & ASPART (70-30 MIX) 100 UNIT/ML ~~LOC~~ SUSP
5.0000 [IU] | Freq: Two times a day (BID) | SUBCUTANEOUS | Status: DC
  Administered 2022-09-21 – 2022-09-25 (×8): 5 [IU] via SUBCUTANEOUS
  Filled 2022-09-21: qty 10

## 2022-09-21 MED ORDER — INSULIN ASPART 100 UNIT/ML IJ SOLN
0.0000 [IU] | Freq: Three times a day (TID) | INTRAMUSCULAR | Status: DC
  Administered 2022-09-21 – 2022-09-22 (×2): 2 [IU] via SUBCUTANEOUS
  Administered 2022-09-22 – 2022-09-23 (×3): 3 [IU] via SUBCUTANEOUS
  Administered 2022-09-23: 5 [IU] via SUBCUTANEOUS
  Administered 2022-09-24: 1 [IU] via SUBCUTANEOUS
  Administered 2022-09-24 – 2022-09-25 (×3): 3 [IU] via SUBCUTANEOUS
  Administered 2022-09-25: 2 [IU] via SUBCUTANEOUS

## 2022-09-21 MED ORDER — GLUCERNA SHAKE PO LIQD
237.0000 mL | Freq: Two times a day (BID) | ORAL | Status: DC
  Administered 2022-09-21 – 2022-09-25 (×9): 237 mL via ORAL

## 2022-09-21 NOTE — Progress Notes (Addendum)
Physical Therapy Treatment Patient Details Name: Grant Yang MRN: [id number] DOB: [birth year] Today's Date: 09/21/2022   History of Present Illness Admitted with L LE infection necesitating transtibial amputation;  has a past medical history of Cancer (HCC), Diabetes mellitus, GERD (gastroesophageal reflux disease), History of kidney stones, Hypertension, Neuropathy in diabetes (HCC), Shortness of breath, and Stroke (HCC) (2010).    PT Comments  Pt is progressing well with goals. Currently at Mod I level for bed mobility and transfers. Performed W/C transfer simulated with chair in room at Mod I.  Pt will benefit from W/C for independence with mobility in and outside of the home. Due to pt current functional status, home set up and avilaible assistance at home recommending skilled physical therapy services 3x/weekly on discharge in order to decrease risk for falls, injury, immobility and re-hospitalization.     If plan is discharge home, recommend the following: A little help with walking and/or transfers;A little help with bathing/dressing/bathroom;Assistance with cooking/housework;Assist for transportation   Can travel by private vehicle        Equipment Recommendations  BSC/3in1;Wheelchair (measurements PT);Wheelchair cushion (measurements PT) (24 inch, flip back arm rest)    Recommendations for Other Services       Precautions / Restrictions Precautions Precautions: Fall;Other (comment) Precaution Comments: Enteric Precautions Restrictions Weight Bearing Restrictions: No     Mobility  Bed Mobility Overal bed mobility: Modified Independent Bed Mobility: Supine to Sit     Supine to sit: Modified independent (Device/Increase time)     General bed mobility comments: Increased time Patient Response: Cooperative  Transfers Overall transfer level: Modified independent Equipment used: None Transfers: Bed to chair/wheelchair/BSC       Squat pivot transfers: Modified  independent (Device/Increase time)     General transfer comment: No cues necessary    Ambulation/Gait     General Gait Details: Deferred today due to pt has been ambulating and lunch was brought. Pt wanted to eat     Tilt Bed Tilt Bed Patient Response: Cooperative  Modified Rankin (Stroke Patients Only)       Balance Overall balance assessment: Modified Independent Sitting-balance support: Feet supported Sitting balance-Leahy Scale: Good     Standing balance support: During functional activity Standing balance-Leahy Scale: Fair Standing balance comment: no overt LOB         Cognition Arousal/Alertness: Awake/alert Behavior During Therapy: WFL for tasks assessed/performed Overall Cognitive Status: Within Functional Limits for tasks assessed                 Pertinent Vitals/Pain Pain Assessment Pain Score: 6  Pain Location: scotum and L residual limb Pain Descriptors / Indicators: Throbbing, Burning Pain Intervention(s): Monitored during session, Premedicated before session     PT Goals (current goals can now be found in the care plan section) Acute Rehab PT Goals Patient Stated Goal: Wants to get a prosthesis PT Goal Formulation: With patient Time For Goal Achievement: 10/02/22 Potential to Achieve Goals: Good Additional Goals Additional Goal #1: Pt will deomonstrate proficiency in wheelchair propulsion and parts management with modified independence Progress towards PT goals: Progressing toward goals    Frequency    Min 1X/week      PT Plan Discharge plan needs to be updated       AM-PAC PT "6 Clicks" Mobility   Outcome Measure  Help needed turning from your back to your side while in a flat bed without using bedrails?: None Help needed moving from lying on your back to sitting on  the side of a flat bed without using bedrails?: None Help needed moving to and from a bed to a chair (including a wheelchair)?: None Help needed standing up  from a chair using your arms (e.g., wheelchair or bedside chair)?: None Help needed to walk in hospital room?: A Little Help needed climbing 3-5 steps with a railing? : A Little 6 Click Score: 22    End of Session Equipment Utilized During Treatment: Gait belt Activity Tolerance: Patient tolerated treatment well Patient left: with call bell/phone within reach;in bed Nurse Communication: Mobility status PT Visit Diagnosis: Unsteadiness on feet (R26.81);Other abnormalities of gait and mobility (R26.89)     Time: 4098-1191 PT Time Calculation (min) (ACUTE ONLY): 18 min  Charges:    $Therapeutic Activity: 8-22 mins PT General Charges $$ ACUTE PT VISIT: 1 Visit                     Harrel Carina, DPT, CLT  Acute Rehabilitation Services Office: [phone] (Secure chat preferred)    Claudia Desanctis 09/21/2022, 3:41 PM

## 2022-09-21 NOTE — Care Management Important Message (Signed)
Important Message  Patient Details  Name: Grant Yang MRN: [id number] Date of Birth: 11-13-1955   Medicare Important Message Given:  Yes     Sherilyn Banker 09/21/2022, 1:44 PM

## 2022-09-21 NOTE — Progress Notes (Signed)
[facility]  PROGRESS NOTE  Grant Yang QQP:[id number] DOB: [birth year] DOA: 09/13/2022 PCP: Billie Lade, MD   Brief HPI:   67 year old Caucasian male with a past medical history significant for but not limited to uncontrolled diabetes mellitus type 2, essential hypertension who presents with left foot pain and has a diabetic foot ulcer with osteomyelitis. He presented to [facility] ED on 09/04/2022 complaining of a worsening left foot wound and underwent an MRI which is notable for a large skin wound at the lateral aspect concerning for abscess and osteomyelitis at that time. He was recommended for admission however he subsequently left AMA from the ED and did not seek medical care until presenting to the [facility] ED. Patient reports further progression of left foot discomfort along with worsening of the left lateral foot wound and progressive erythema. Orthopedic surgery was consulted and they are planning on a transtibial amputation on Sunday. Further workup also reveals that he has a gram-negative bacteremia with Proteus in the setting of his diabetic foot infection. 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 Augmentin to complete 4 weeks of duration from 7/28 end of treatment 8/24 -[employer] has denied CIR  Diarrhea -Improved, likely antibiotic  induced  Hyponatremia -Sodium is improved to 132 -Serum osmolality 288 -Urine osmolality 588 -?  SIADH   Diabetes mellitus type 2 -Continue sliding scale insulin with NovoLog -Will start insulin 70/30 5 units subcu twice daily -CBG has been mildly elevated  Hypertension -Blood pressure is stable  Hypophosphatemia -Replete  Normocytic anemia -Iron saturation 9%, B12 602 -Likely in setting of above  Hypoalbuminemia -Will consult nutrition   Medications     amoxicillin-clavulanate  1 tablet Oral Q12H   vitamin C  1,000 mg Oral Daily   docusate sodium  100 mg Oral Daily   feeding supplement (GLUCERNA SHAKE)  237 mL Oral BID BM   insulin aspart  0-9 Units Subcutaneous Q6H   nutrition supplement (JUVEN)  1 packet Oral BID BM   pantoprazole  40 mg Oral Daily   zinc sulfate  220 mg Oral Daily     Data Reviewed:   CBG:  Recent Labs  Lab 09/20/22 1209 09/20/22 1745 09/21/22 0006 09/21/22 0634 09/21/22 1143  GLUCAP 264* 226* 213* 200* 215*    SpO2: 100 %    Vitals:   09/20/22 1959 09/21/22 0500 09/21/22 0536 09/21/22 0743  BP: 127/71  (!) 142/73 (!) 143/93  Pulse: 71  68 74  Resp: 15  17 18   Temp: 98.3 F (36.8 C)  98.3 F (36.8 C) 98.5 F (36.9 C)  TempSrc:   Oral   SpO2: 99%  98% 100%  Weight:  89.1 kg    Height:          Data Reviewed:  Basic Metabolic Panel: Recent Labs  Lab 09/15/22 0229 09/16/22 0349 09/16/22 2202 09/17/22 0246 09/18/22 0258 09/19/22 0205 09/21/22 0716  NA  127* 130* 131* 128* 130* 131* 132*  K 3.8 3.5 3.3* 3.5 3.5 3.7 3.6  CL 97* 98 95* 97* 98 100 98  CO2 23 23 23 23 23 24 27   GLUCOSE 221* 163* 193* 160* 193* 224* 213*  BUN 17 11 9 8 16 16 13   CREATININE 0.71 0.80 0.78 0.72 0.82 0.73 0.63  CALCIUM 8.3* 8.4* 8.3* 8.0* 8.0* 7.9* 8.1*  MG 2.2 2.1  --  2.0 2.1 2.0  --   PHOS 1.9* 2.7  --  2.0* 2.9 2.9  --     CBC: Recent Labs  Lab 09/16/22 0349 09/16/22 2202 09/17/22 0246 09/18/22 0258 09/19/22 0205  09/21/22 0716  WBC 27.5* 23.2* 25.3* 24.3* 19.8* 14.4*  NEUTROABS 22.4* 18.9* 22.3* 19.2* 15.3*  --   HGB 10.1* 8.6* 8.7* 8.1* 8.6* 8.9*  HCT 31.0* 26.6* 26.1* 25.6* 27.3* 26.6*  MCV 80.7 78.7* 78.1* 80.3 81.7 81.6  PLT 579* 611* 589* 588* 699* 729*    LFT Recent Labs  Lab 09/16/22 2202 09/17/22 0246 09/18/22 0258 09/19/22 0205 09/21/22 0716  AST 19 17 25 17 17   ALT 14 13 14 14 15   ALKPHOS 103 104 95 89 77  BILITOT 0.5 0.8 0.3 0.5 0.4  PROT 5.9* 6.0* 5.7* 6.2* 6.1*  ALBUMIN <1.5* <1.5* <1.5* <1.5* <1.5*     Antibiotics: Anti-infectives (From admission, onward)    Start     Dose/Rate Route Frequency Ordered Stop   09/20/22 1000  amoxicillin-clavulanate (AUGMENTIN) 875-125 MG per tablet 1 tablet        1 tablet Oral Every 12 hours 09/20/22 0911 10/18/22 0959   09/17/22 0821  vancomycin (VANCOCIN) powder  Status:  Discontinued          As needed 09/17/22 0822 09/17/22 0840   09/17/22 0640  ceFAZolin (ANCEF) 2-4 GM/100ML-% IVPB       Note to Pharmacy: Aquilla Hacker M: cabinet override      09/17/22 0640 09/17/22 0756   09/17/22 0600  ceFAZolin (ANCEF) IVPB 2g/100 mL premix        2 g 200 mL/hr over 30 Minutes Intravenous On call to O.R. 09/16/22 1944 09/17/22 0820   09/14/22 2200  vancomycin (VANCOREADY) IVPB 1250 mg/250 mL  Status:  Discontinued        1,250 mg 166.7 mL/hr over 90 Minutes Intravenous Every 24 hours 09/14/22 1054 09/19/22 0859   09/14/22 1930  vancomycin (VANCOREADY) IVPB 1500 mg/300 mL  Status:  Discontinued        1,500 mg 150 mL/hr over 120 Minutes Intravenous Every 24 hours 09/13/22 2048 09/14/22 1054   09/14/22 1630  ceFEPIme (MAXIPIME) 2 g in sodium chloride 0.9 % 100 mL IVPB  Status:  Discontinued        2 g 200 mL/hr over 30 Minutes Intravenous Every 8 hours 09/14/22 1054 09/20/22 0911   09/13/22 2030  ceFEPIme (MAXIPIME) 2 g in sodium chloride 0.9 % 100 mL IVPB  Status:  Discontinued        2 g 200 mL/hr over 30 Minutes Intravenous Every 12  hours 09/13/22 2015 09/14/22 1054   09/13/22 2030  metroNIDAZOLE (FLAGYL) IVPB 500 mg  Status:  Discontinued        500 mg 100 mL/hr over 60 Minutes Intravenous Every 12 hours 09/13/22 2015 09/20/22 0911   09/13/22 1745  vancomycin (VANCOCIN) IVPB 1000 mg/200 mL premix  Status:  Discontinued        1,000 mg 200 mL/hr over 60 Minutes  Intravenous  Once 09/13/22 1735 09/13/22 1742   09/13/22 1745  vancomycin (VANCOREADY) IVPB 1750 mg/350 mL        1,750 mg 175 mL/hr over 120 Minutes Intravenous  Once 09/13/22 1742 09/13/22 2105        DVT prophylaxis: SCDs  Code Status: Full code  Family Communication: No family at bedside   CONSULTS orthopedics   Subjective   Denies any complaints   Objective    Physical Examination:   General-appears in no acute distress Heart-S1-S2, regular, no murmur auscultated Lungs-clear to auscultation bilaterally, no wheezing or crackles auscultated Abdomen-soft, nontender, no organomegaly Extremities-no edema in the lower extremities, s/p left BKA Neuro-alert, oriented x3, no focal deficit noted   Status is: Inpatient:             Meredeth Ide   [facility] If 7PM-7AM, please contact night-coverage at [email or web address], Office  [phone]   09/21/2022, 12:51 PM  LOS: 8 days

## 2022-09-21 NOTE — Inpatient Diabetes Management (Addendum)
Inpatient Diabetes Program Recommendations  AACE/ADA: New Consensus Statement on Inpatient Glycemic Control (2015)  Target Ranges:  Prepandial:   less than 140 mg/dL      Peak postprandial:   less than 180 mg/dL (1-2 hours)      Critically ill patients:  140 - 180 mg/dL   Lab Results  Component Value Date   GLUCAP 215 (H) 09/21/2022   HGBA1C 9.8 (H) 09/16/2022    Latest Reference Range & Units 09/20/22 07:34 09/20/22 12:09 09/20/22 17:45 09/21/22 00:06 09/21/22 06:34 09/21/22 11:43  Glucose-Capillary 70 - 99 mg/dL 161 (H) 096 (H) 045 (H) 213 (H) 200 (H) 215 (H)  (H): Data is abnormally high  Review of Glycemic Control  Diabetes history: DM2 Outpatient Diabetes medications: Novolin 70/30 3 units BID, Mounjaro 2.5 mg weekly Current orders for Inpatient glycemic control: Novolog 0-9 units Q6H  Inpatient Diabetes Program Recommendations:   Please consider: -70/30 insulin 4 units bid ac meals -Change Novolog correction to 0-9 units tid, 0-5 units hs  Gave patient two G7 Dexcom CGMs to take for home use per patient request. Patient has used Dexcom G7 in the past and ran out of prescriptions-plans to ask his PCP for new prescription. Discharge needs: Dexcom G7 [id number] (no prior authorization needed with $0 copay per Roland Earl PT.  Thank you, Billy Fischer. Emila Steinhauser, RN, MSN, CDE  Diabetes Coordinator Inpatient Glycemic Control Team Team Pager [phone] (8am-5pm) 09/21/2022 12:26 PM

## 2022-09-21 NOTE — Progress Notes (Signed)
Patient ID: Grant Yang, male   DOB: 1955/11/20, 67 y.o.   MRN: [id number] Patient planning on discharge to home.  Inpatient rehab was denied.  I will have the wound VAC dressing removed at time of discharge and apply dry dressing plus the stump shrinker.

## 2022-09-21 NOTE — Progress Notes (Signed)
Inpatient Rehab Admissions Coordinator:   Received request for P2P from home and community care, which Dr. Riley Kill completed.  Decision ultimately for denial for inpatient rehab due to pt's current level of function.  I let pt know and he would like to discharge home with home health.  I let TOC team and medical team know.   Estill Dooms, PT, DPT Admissions Coordinator [phone] 09/21/22  11:11 AM

## 2022-09-21 NOTE — Consult Note (Signed)
   [facility] CM Inpatient Consult   09/21/2022  Grant Yang 1956-01-09 [id number]  [facility] [[facility]]  Accountable Care Organization [ACO] Patient: [name] Medicare  Primary Care Provider:  Billie Lade, MD with [facility]  Follow up:  Disposition update  Called and HIPAA verified with patient at bedside to explain [facility] Care Management services.  Patient will receive post hospital discharge call and for assessments fo community needs for care/disease management.  Patient's disposition is currently for home with [facility].  Patient's A1C 9.8 and to refer for closer home monitoring with [facility] on behalf of being in [facility]. Patient was accepting.  Of note, [facility] Care Management services does not replace or interfere with any services that are arranged by inpatient Transition of Care [TOC] team     For additional questions or referrals please contact:    Charlesetta Shanks, RN BSN CCM [facility]  [phone] business mobile phone Toll free office [phone]  *Concierge Line  [phone] Fax number: [phone] [email or web address]

## 2022-09-21 NOTE — TOC Benefit Eligibility Note (Signed)
Pharmacy Patient Advocate Encounter  Insurance verification completed.    The patient is insured through [employer] for [employer] and the current 30 day co-pay is $0.00.   This test claim was processed through [facility] copay amounts may vary at other pharmacies due to pharmacy/plan contracts, or as the patient moves through the different stages of their insurance plan.    Roland Earl, CPHT Pharmacy Patient Advocate Specialist [facility] Patient Advocate Team Direct Number: [phone]  Fax: [phone]

## 2022-09-21 NOTE — TOC Progression Note (Signed)
Transition of Care [facility]) - Progression Note    Patient Details  Name: Grant Yang MRN: [id number] Date of Birth: 03/14/1955  Transition of Care [facility] CM/SW Contact  Epifanio Lesches, RN Phone Number: 09/21/2022, 1:17 PM  Clinical Narrative:          - s/p L AMPUTATION BELOW KNEE  Pt with denial for CIR services. Pt states will transition to home. Agreeable to home health services. Pt without provider preference. Referral made with  [facility] and accepted. DME need, RW and BSC. Referral made with [facility] [employer]. Equipment will be delivered to bedside prior to discharge.    09/22/2022 @ 1525  Pt states son unable to assist with care if d/c to home 2/2 working. Pt lives alone. Pt requesting SNF / REHAB ST. CSW to f/u ...      Expected Discharge Plan: Home w Home Health Services Barriers to Discharge: Continued Medical Work up  Expected Discharge Plan and Services   Discharge Planning Services: CM Consult   Living arrangements for the past 2 months: Single Family Home                                       Social Determinants of Health (SDOH) Interventions SDOH Screenings   Food Insecurity: No Food Insecurity (07/30/2022)  Housing: Low Risk  (07/30/2022)  Transportation Needs: No Transportation Needs (07/30/2022)  Utilities: Not At Risk (07/30/2022)  Depression (PHQ2-9): Low Risk  (09/04/2022)  Tobacco Use: Medium Risk (09/17/2022)    Readmission Risk Interventions     No data to display

## 2022-09-21 NOTE — Plan of Care (Signed)

## 2022-09-21 NOTE — Progress Notes (Signed)
Initial Nutrition Assessment  DOCUMENTATION CODES:   Severe malnutrition in context of chronic illness  INTERVENTION:  Glucerna Shake po BID, each supplement provides 220 kcal and 10 grams of protein Juven BID, Vitamin C and Zinc supplements to support wound healing "Plate Method" and "Carbohydrate Counting for People with Diabetes" handout discussed and added to AVS Coupons for Juven to be provided  NUTRITION DIAGNOSIS:   Severe Malnutrition related to chronic illness as evidenced by severe fat depletion, severe muscle depletion  GOAL:   Patient will meet greater than or equal to 90% of their needs  MONITOR:   PO intake  REASON FOR ASSESSMENT:   Consult Assessment of nutrition requirement/status  ASSESSMENT:   Pt admitted with severe sepsis/acute osteomyelitis of L foot with diabetic foot ulcer c/b proteus bacteremia. PMH significant for uncontrolled T2DM and HTN.   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   Spoke with pt at bedside. He endorses a much improved appetite/PO intake. His baseline PO intake includes 2 eggs, 2 pieces of bacon and 2 pieces of oatnut bread. Lunch includes half a [place] or ham and cheese sandwich. He likes to snack on crackers and cheese. Dinner is variable. Sometimes he eats out, cooks a Hydrologist or eats a frozen dinner including pot pie and lasagna. He had a very poor appetite about 2-3 days PTA and did not eat anything for these days. States that on admission, he was initially only consuming about half of his meals but now he is "cleaning" his plate.   He does not consume protein shakes at home but drinks Propel waters and juven once daily since admission in April.   Meal completions: 7/25: 0% breakfast, 100% lunch 7/26: 0% breakfast, 100% lunch 7/30: 100% breakfast, 50% lunch 8/1: 100% per observation  Pt is uncertain how much weight loss he has encountered but feels that for the last  several months, his weight has been trending down as his clothes fit more loosely. Reviewed weight history which appears to have been widely variable since April. Noted a consistent loss of weight (5.7%) between 06/05 and 07/15.  Pt mentions needing to throw away all of his frozen foods at home on discharge. Diabetes diet education provided during prior admission by RD team. Briefly reviewed this information. Explained to pt the foods he eats can fit into a balanced diet. Provided ways to reduce amount of carbohydrates via portion sizes and encouraged pt to add nutrient dense foods such as fruits/vegetables/more protein.   Medications: Vitamin C 1000mg  daily, colace, SSI 0-9 units q6h, protonix, zinc 220mg  daily  Labs: sodium 132, albumin <1.5, HgbA1C 9.8%, CBG's 153-264 x24 hours  NUTRITION - FOCUSED PHYSICAL EXAM:  Flowsheet Row Most Recent Value  Orbital Region Severe depletion  Upper Arm Region Moderate depletion  Thoracic and Lumbar Region No depletion  Buccal Region Severe depletion  Temple Region Severe depletion  Clavicle Bone Region Severe depletion  Clavicle and Acromion Bone Region Moderate depletion  Scapular Bone Region Moderate depletion  Dorsal Hand Mild depletion  Patellar Region Severe depletion  Anterior Thigh Region Severe depletion  Posterior Calf Region Moderate depletion  [L BKA]  Edema (RD Assessment) Mild  [mild LLE]  Hair Reviewed  Eyes Reviewed  Mouth Reviewed  Skin Reviewed  Nails Reviewed       Diet Order:   Diet Order  Diet Carb Modified Fluid consistency: Thin; Room service appropriate? Yes  Diet effective now                   EDUCATION NEEDS:   Education needs have been addressed  Skin:  Skin Assessment: Skin Integrity Issues: Skin Integrity Issues:: Wound VAC Wound Vac: L leg  Last BM:  7/31 (type 4)  Height:   Ht Readings from Last 1 Encounters:  09/17/22 5' 10.98" (1.803 m)    Weight:   Wt Readings from Last  1 Encounters:  09/21/22 89.1 kg   BMI:  Body mass index is 27.41 kg/m.  Estimated Nutritional Needs:   Kcal:  2200-2400  Protein:  115-130g  Fluid:  >/=2L  Drusilla Kanner, RDN, LDN Clinical Nutrition

## 2022-09-21 NOTE — Progress Notes (Signed)
    Durable Medical Equipment  (From admission, onward)           Start     Ordered   09/21/22 1315  For home use only DME Bedside commode  Once       Comments: Confine to one room  Question:  Patient needs a bedside commode to treat with the following condition  Answer:  S/P BKA (below knee amputation) (HCC)   09/21/22 1314   09/21/22 1313  For home use only DME Walker rolling  Once       Question Answer Comment  Walker: With 5 Inch Wheels   Patient needs a walker to treat with the following condition S/P BKA (below knee amputation) (HCC)      09/21/22 1314

## 2022-09-21 NOTE — Discharge Instructions (Signed)

## 2022-09-22 ENCOUNTER — Encounter: Payer: Self-pay | Admitting: *Deleted

## 2022-09-22 DIAGNOSIS — E871 Hypo-osmolality and hyponatremia: Secondary | ICD-10-CM | POA: Diagnosis not present

## 2022-09-22 DIAGNOSIS — M869 Osteomyelitis, unspecified: Secondary | ICD-10-CM | POA: Diagnosis not present

## 2022-09-22 DIAGNOSIS — Z89612 Acquired absence of left leg above knee: Secondary | ICD-10-CM | POA: Diagnosis not present

## 2022-09-22 DIAGNOSIS — N19 Unspecified kidney failure: Secondary | ICD-10-CM | POA: Diagnosis not present

## 2022-09-22 LAB — GLUCOSE, CAPILLARY
Glucose-Capillary: 161 mg/dL — ABNORMAL HIGH (ref 70–99)
Glucose-Capillary: 204 mg/dL — ABNORMAL HIGH (ref 70–99)
Glucose-Capillary: 204 mg/dL — ABNORMAL HIGH (ref 70–99)
Glucose-Capillary: 225 mg/dL — ABNORMAL HIGH (ref 70–99)

## 2022-09-22 NOTE — Progress Notes (Signed)
Occupational Therapy Treatment Patient Details Name: Grant Yang MRN: [id number] DOB: 12-12-1955 Today's Date: 09/22/2022   History of present illness Admitted with L LE infection necesitating transtibial amputation;  has a past medical history of Cancer (HCC), Diabetes mellitus, GERD (gastroesophageal reflux disease), History of kidney stones, Hypertension, Neuropathy in diabetes (HCC), Shortness of breath, and Stroke (HCC) (2010).   OT comments  Pt continuing to progress in OT sessions, challenged balance by completing dynamic reaching tasks while maintaining standing balance with min guard for safety. Educated pt on strategies needed to complete standing bADLs to improve activity tolerance and reduce LLE fatigue, he demonstrated understanding. Pt in low spirits about insurance and going to rehab, family providing pt with consolement. OT to continue to progress pt as able, DC plans remain appropriate for AIR.    Recommendations for follow up therapy are one component of a multi-disciplinary discharge planning process, led by the attending physician.  Recommendations may be updated based on patient status, additional functional criteria and insurance authorization.    Assistance Recommended at Discharge Frequent or constant Supervision/Assistance  Patient can return home with the following  A lot of help with walking and/or transfers;A little help with bathing/dressing/bathroom;Assistance with cooking/housework;Assist for transportation;Help with stairs or ramp for entrance   Equipment Recommendations  Other (comment) (defer)    Recommendations for Other Services      Precautions / Restrictions Precautions Precautions: Fall;Other (comment) Precaution Comments: Enteric Precautions Restrictions Weight Bearing Restrictions: Yes LLE Weight Bearing: Non weight bearing       Mobility Bed Mobility Overal bed mobility: Modified Independent Bed Mobility: Supine to Sit     Supine to  sit: Modified independent (Device/Increase time)          Transfers Overall transfer level: Modified independent   Transfers: Sit to/from Stand, Bed to chair/wheelchair/BSC Sit to Stand: Min guard   Squat pivot transfers: Modified independent (Device/Increase time)             Balance Overall balance assessment: Needs assistance Sitting-balance support: Feet supported Sitting balance-Leahy Scale: Good     Standing balance support: During functional activity Standing balance-Leahy Scale: Fair                             ADL either performed or assessed with clinical judgement   ADL Overall ADL's : Needs assistance/impaired                         Toilet Transfer: Min guard;BSC/3in1;Stand-pivot   Toileting- Clothing Manipulation and Hygiene: Sitting/lateral lean;Modified independent Toileting - Clothing Manipulation Details (indicate cue type and reason): rear pericare     Functional mobility during ADLs: Min guard;Rolling walker (2 wheels) General ADL Comments: Educated pt on the use of hip support for prolonged standing to increase activity tolerance when engaging in standing ADLs, also educated pt on the use UE support against sturdy surface to maintain balance for reaching objects from floor. Pt collected 3/3 items from floor with two verbal cues for safety and RW positioning to optimize task saftey.    Extremity/Trunk Assessment              Vision       Perception     Praxis      Cognition Arousal/Alertness: Awake/alert Behavior During Therapy: WFL for tasks assessed/performed Overall Cognitive Status: Within Functional Limits for tasks assessed  Exercises      Shoulder Instructions       General Comments Educated pt on relief from compressive devices and wearing of ampushield    Pertinent Vitals/ Pain       Pain Assessment Pain Assessment: No/denies  pain  Home Living                                          Prior Functioning/Environment              Frequency  Min 1X/week        Progress Toward Goals  OT Goals(current goals can now be found in the care plan section)  Progress towards OT goals: Progressing toward goals  Acute Rehab OT Goals OT Goal Formulation: With patient Time For Goal Achievement: 10/03/22 Potential to Achieve Goals: Good  Plan Discharge plan remains appropriate;Frequency remains appropriate    Co-evaluation                 AM-PAC OT "6 Clicks" Daily Activity     Outcome Measure   Help from another person eating meals?: None Help from another person taking care of personal grooming?: A Little Help from another person toileting, which includes using toliet, bedpan, or urinal?: A Little Help from another person bathing (including washing, rinsing, drying)?: A Little Help from another person to put on and taking off regular upper body clothing?: A Little Help from another person to put on and taking off regular lower body clothing?: A Little 6 Click Score: 19    End of Session Equipment Utilized During Treatment: Gait belt;Rolling walker (2 wheels)  OT Visit Diagnosis: Unsteadiness on feet (R26.81);Other abnormalities of gait and mobility (R26.89);Pain Pain - Right/Left: Left Pain - part of body: Leg   Activity Tolerance Patient tolerated treatment well   Patient Left in chair;with call bell/phone within reach   Nurse Communication Mobility status        Time: 1610-9604 OT Time Calculation (min): 28 min  Charges: OT General Charges $OT Visit: 1 Visit OT Treatments $Therapeutic Activity: 23-37 mins  09/22/2022  AB, OTR/L  Acute Rehabilitation Services  Office: [phone]   Tristan Schroeder 09/22/2022, 12:28 PM

## 2022-09-22 NOTE — Progress Notes (Signed)
Mobility Specialist Progress Note   09/22/22 1445  Mobility  Activity Transferred from bed to chair  Level of Assistance Contact guard assist, steadying assist  Assistive Device Front wheel walker  Distance Ambulated (ft) 5 ft  Range of Motion/Exercises Active;All extremities  LLE Weight Bearing NWB  Activity Response Tolerated well   Patient received in supine and agreeable to participate. Completed bed mobility independently, stood and transferred to recliner min guard.Tolerated without complaint or incident. Was left in recliner with all needs met, call bell in reach.   [place] , BS EXP Mobility Specialist Please contact via SecureChat or Rehab office at [phone]

## 2022-09-22 NOTE — Progress Notes (Signed)
[facility]  PROGRESS NOTE  Grant Yang ZOX:[id number] DOB: Nov 03, 1955 DOA: 09/13/2022 PCP: Billie Lade, MD   Brief HPI:   67 year old Caucasian male with a past medical history significant for but not limited to uncontrolled diabetes mellitus type 2, essential hypertension who presents with left foot pain and has a diabetic foot ulcer with osteomyelitis. He presented to [facility] ED on 09/04/2022 complaining of a worsening left foot wound and underwent an MRI which is notable for a large skin wound at the lateral aspect concerning for abscess and osteomyelitis at that time. He was recommended for admission however he subsequently left AMA from the ED and did not seek medical care until presenting to the [facility] ED. Patient reports further progression of left foot discomfort along with worsening of the left lateral foot wound and progressive erythema. Orthopedic surgery was consulted and they are planning on a transtibial amputation on Sunday. Further workup also reveals that he has a gram-negative bacteremia with Proteus in the setting of his diabetic foot infection. 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 Augmentin to complete 4 weeks of duration from 7/28 end of treatment 8/24 -[employer] has denied CIR -Patient has decided to go to skilled nursing facility for  rehab  Diarrhea -Improved, likely antibiotic induced  Hyponatremia -Sodium is improved to 132 -Serum osmolality 288 -Urine osmolality 588 -?  SIADH   Diabetes mellitus type 2 -Continue sliding scale insulin with NovoLog -Will start insulin 70/30 5 units subcu twice daily -CBG well-controlled  Hypertension -Blood pressure is stable  Hypophosphatemia -Replete  Normocytic anemia -Iron saturation 9%, B12 602 -Likely in setting of above  Hypoalbuminemia -Will consult nutrition  ?  Depression -Nursing staff is concerned that patient may be depressed -Discussed with patient, denies depression, denies suicidal ideation  Medications     amoxicillin-clavulanate  1 tablet Oral Q12H   vitamin C  1,000 mg Oral Daily   docusate sodium  100 mg Oral Daily   feeding supplement (GLUCERNA SHAKE)  237 mL Oral BID BM   insulin aspart  0-9 Units Subcutaneous TID WC   insulin aspart protamine- aspart  5 Units Subcutaneous BID WC   nutrition supplement (JUVEN)  1 packet Oral BID BM   pantoprazole  40 mg Oral Daily   zinc sulfate  220 mg Oral Daily     Data Reviewed:   CBG:  Recent Labs  Lab 09/21/22 1143 09/21/22 1816 09/21/22 2348 09/22/22 0519 09/22/22 1139  GLUCAP 215* 162* 189* 204* 161*    SpO2: 96 %    Vitals:   09/21/22 2130 09/22/22 0517 09/22/22 0719 09/22/22 1427  BP: 130/71 (!) 146/78 (!) 145/74 132/70  Pulse: 62 67 65 68  Resp: 18 16 17 16   Temp: 98 F (36.7 C)  98 F (36.7 C) 98 F (36.7 C)  TempSrc: Oral     SpO2: 97% 95% 98% 96%  Weight:  Height:          Data Reviewed:  Basic Metabolic Panel: Recent Labs  Lab 09/16/22 0349 09/16/22 2202 09/17/22 0246 09/18/22 0258 09/19/22 0205 09/21/22 0716  NA 130* 131* 128* 130* 131* 132*  K 3.5 3.3* 3.5 3.5 3.7 3.6  CL 98 95* 97* 98 100 98  CO2 23 23 23 23 24 27   GLUCOSE 163* 193* 160* 193* 224* 213*  BUN 11 9 8 16 16 13   CREATININE 0.80 0.78 0.72 0.82 0.73 0.63  CALCIUM 8.4* 8.3*  8.0* 8.0* 7.9* 8.1*  MG 2.1  --  2.0 2.1 2.0  --   PHOS 2.7  --  2.0* 2.9 2.9  --     CBC: Recent Labs  Lab 09/16/22 0349 09/16/22 2202 09/17/22 0246 09/18/22 0258 09/19/22 0205 09/21/22 0716  WBC 27.5* 23.2* 25.3* 24.3* 19.8* 14.4*  NEUTROABS 22.4* 18.9* 22.3* 19.2* 15.3*  --   HGB 10.1* 8.6* 8.7* 8.1* 8.6* 8.9*  HCT 31.0* 26.6* 26.1* 25.6* 27.3* 26.6*  MCV 80.7 78.7* 78.1* 80.3 81.7 81.6  PLT 579* 611* 589* 588* 699* 729*    LFT Recent Labs  Lab 09/16/22 2202 09/17/22 0246 09/18/22 0258 09/19/22 0205 09/21/22 0716  AST 19 17 25 17 17   ALT 14 13 14 14 15   ALKPHOS 103 104 95 89 77  BILITOT 0.5 0.8 0.3 0.5 0.4  PROT 5.9* 6.0* 5.7* 6.2* 6.1*  ALBUMIN <1.5* <1.5* <1.5* <1.5* <1.5*     Antibiotics: Anti-infectives (From admission, onward)    Start     Dose/Rate Route Frequency Ordered Stop   09/20/22 1000  amoxicillin-clavulanate (AUGMENTIN) 875-125 MG per tablet 1 tablet        1 tablet Oral Every 12 hours 09/20/22 0911 10/18/22 0959   09/17/22 0821  vancomycin (VANCOCIN) powder  Status:  Discontinued          As needed 09/17/22 0822 09/17/22 0840   09/17/22 0640  ceFAZolin (ANCEF) 2-4 GM/100ML-% IVPB       Note to Pharmacy: Aquilla Hacker M: cabinet override      09/17/22 0640 09/17/22 0756   09/17/22 0600  ceFAZolin (ANCEF) IVPB 2g/100 mL premix        2 g 200 mL/hr over 30 Minutes Intravenous On call to O.R. 09/16/22 1944 09/17/22 0820   09/14/22 2200  vancomycin (VANCOREADY) IVPB 1250 mg/250 mL  Status:  Discontinued        1,250 mg 166.7 mL/hr over 90 Minutes Intravenous Every 24 hours 09/14/22 1054 09/19/22 0859   09/14/22 1930  vancomycin (VANCOREADY) IVPB 1500 mg/300 mL  Status:  Discontinued        1,500 mg 150 mL/hr over 120 Minutes Intravenous Every 24 hours 09/13/22 2048 09/14/22 1054   09/14/22 1630  ceFEPIme (MAXIPIME) 2 g in sodium chloride 0.9 % 100 mL IVPB  Status:  Discontinued        2 g 200 mL/hr over 30 Minutes Intravenous Every 8 hours  09/14/22 1054 09/20/22 0911   09/13/22 2030  ceFEPIme (MAXIPIME) 2 g in sodium chloride 0.9 % 100 mL IVPB  Status:  Discontinued        2 g 200 mL/hr over 30 Minutes Intravenous Every 12 hours 09/13/22 2015 09/14/22 1054   09/13/22 2030  metroNIDAZOLE (FLAGYL) IVPB 500 mg  Status:  Discontinued        500 mg 100 mL/hr over 60 Minutes Intravenous Every 12 hours 09/13/22 2015 09/20/22 0911   09/13/22 1745  vancomycin (  VANCOCIN) IVPB 1000 mg/200 mL premix  Status:  Discontinued        1,000 mg 200 mL/hr over 60 Minutes Intravenous  Once 09/13/22 1735 09/13/22 1742   09/13/22 1745  vancomycin (VANCOREADY) IVPB 1750 mg/350 mL        1,750 mg 175 mL/hr over 120 Minutes Intravenous  Once 09/13/22 1742 09/13/22 2105        DVT prophylaxis: SCDs  Code Status: Full code  Family Communication: No family at bedside   CONSULTS orthopedics   Subjective   Denies any complaints.   Objective    Physical Examination:  General-appears in no acute distress Heart-S1-S2, regular, no murmur auscultated Lungs-clear to auscultation bilaterally, no wheezing or crackles auscultated Abdomen-soft, nontender, no organomegaly Extremities-no edema in the lower extremities, s/p left BKA Neuro-alert, oriented x3, no focal deficit noted   Status is: Inpatient:          Meredeth Ide   [facility] If 7PM-7AM, please contact night-coverage at [email or web address], Office  [phone]   09/22/2022, 4:09 PM  LOS: 9 days

## 2022-09-23 DIAGNOSIS — N19 Unspecified kidney failure: Secondary | ICD-10-CM | POA: Diagnosis not present

## 2022-09-23 DIAGNOSIS — Z89612 Acquired absence of left leg above knee: Secondary | ICD-10-CM | POA: Diagnosis not present

## 2022-09-23 DIAGNOSIS — E871 Hypo-osmolality and hyponatremia: Secondary | ICD-10-CM | POA: Diagnosis not present

## 2022-09-23 DIAGNOSIS — M869 Osteomyelitis, unspecified: Secondary | ICD-10-CM | POA: Diagnosis not present

## 2022-09-23 LAB — GLUCOSE, CAPILLARY
Glucose-Capillary: 198 mg/dL — ABNORMAL HIGH (ref 70–99)
Glucose-Capillary: 233 mg/dL — ABNORMAL HIGH (ref 70–99)
Glucose-Capillary: 219 mg/dL — ABNORMAL HIGH (ref 70–99)
Glucose-Capillary: 256 mg/dL — ABNORMAL HIGH (ref 70–99)

## 2022-09-23 NOTE — Progress Notes (Signed)
[facility]  PROGRESS NOTE  Grant Yang ZOX:[id number] DOB: 1955-02-22 DOA: 09/13/2022 PCP: Billie Lade, MD   Brief HPI:   67 year old Caucasian male with a past medical history significant for but not limited to uncontrolled diabetes mellitus type 2, essential hypertension who presents with left foot pain and has a diabetic foot ulcer with osteomyelitis. He presented to [facility] ED on 09/04/2022 complaining of a worsening left foot wound and underwent an MRI which is notable for a large skin wound at the lateral aspect concerning for abscess and osteomyelitis at that time. He was recommended for admission however he subsequently left AMA from the ED and did not seek medical care until presenting to the [facility] ED. Patient reports further progression of left foot discomfort along with worsening of the left lateral foot wound and progressive erythema. Orthopedic surgery was consulted and they are planning on a transtibial amputation on Sunday. Further workup also reveals that he has a gram-negative bacteremia with Proteus in the setting of his diabetic foot infection. 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 Augmentin to complete 4 weeks of duration from 7/28 end of treatment 8/24 -[employer] has denied CIR -Patient has decided to go to skilled nursing facility for  rehab  Diarrhea -Improved, likely antibiotic induced  Hyponatremia -Sodium is improved to 132 -Serum osmolality 288 -Urine osmolality 588 -?  SIADH   Diabetes mellitus type 2 -Continue sliding scale insulin with NovoLog -Will start insulin 70/30 5 units subcu twice daily -CBG well-controlled  Hypertension -Blood pressure is stable  Hypophosphatemia -Replete  Normocytic anemia -Iron saturation 9%, B12 602 -Likely in setting of above  Hypoalbuminemia -Will consult nutrition  ?  Depression -Nursing staff is concerned that patient may be depressed -Discussed with patient, denies depression, denies suicidal ideation  Medications     amoxicillin-clavulanate  1 tablet Oral Q12H   vitamin C  1,000 mg Oral Daily   docusate sodium  100 mg Oral Daily   feeding supplement (GLUCERNA SHAKE)  237 mL Oral BID BM   insulin aspart  0-9 Units Subcutaneous TID WC   insulin aspart protamine- aspart  5 Units Subcutaneous BID WC   nutrition supplement (JUVEN)  1 packet Oral BID BM   pantoprazole  40 mg Oral Daily   zinc sulfate  220 mg Oral Daily     Data Reviewed:   CBG:  Recent Labs  Lab 09/22/22 1139 09/22/22 1659 09/22/22 2348 09/23/22 0613 09/23/22 0931  GLUCAP 161* 225* 204* 198* 233*    SpO2: 98 %    Vitals:   09/22/22 1427 09/22/22 1929 09/23/22 0359 09/23/22 0718  BP: 132/70 124/74 135/75 135/73  Pulse: 68 70 67 68  Resp: 16 17 16    Temp: 98 F (36.7 C) 98.4 F (36.9 C) 97.9 F (36.6 C) 98.1 F (36.7 C)  TempSrc:    Oral  SpO2: 96% 98% 96% 98%  Weight:  Height:          Data Reviewed:  Basic Metabolic Panel: Recent Labs  Lab 09/16/22 2202 09/17/22 0246 09/18/22 0258 09/19/22 0205 09/21/22 0716  NA 131* 128* 130* 131* 132*  K 3.3* 3.5 3.5 3.7 3.6  CL 95* 97* 98 100 98  CO2 23 23 23 24 27   GLUCOSE 193* 160* 193* 224* 213*  BUN 9 8 16 16 13   CREATININE 0.78 0.72 0.82 0.73 0.63  CALCIUM 8.3* 8.0* 8.0* 7.9* 8.1*  MG  --  2.0 2.1  2.0  --   PHOS  --  2.0* 2.9 2.9  --     CBC: Recent Labs  Lab 09/16/22 2202 09/17/22 0246 09/18/22 0258 09/19/22 0205 09/21/22 0716  WBC 23.2* 25.3* 24.3* 19.8* 14.4*  NEUTROABS 18.9* 22.3* 19.2* 15.3*  --   HGB 8.6* 8.7* 8.1* 8.6* 8.9*  HCT 26.6* 26.1* 25.6* 27.3* 26.6*  MCV 78.7* 78.1* 80.3 81.7 81.6  PLT 611* 589* 588* 699* 729*    LFT Recent Labs  Lab 09/16/22 2202 09/17/22 0246 09/18/22 0258 09/19/22 0205 09/21/22 0716  AST 19 17 25 17 17   ALT 14 13 14 14 15   ALKPHOS 103 104 95 89 77  BILITOT 0.5 0.8 0.3 0.5 0.4  PROT 5.9* 6.0* 5.7* 6.2* 6.1*  ALBUMIN <1.5* <1.5* <1.5* <1.5* <1.5*     Antibiotics: Anti-infectives (From admission, onward)    Start     Dose/Rate Route Frequency Ordered Stop   09/20/22 1000  amoxicillin-clavulanate (AUGMENTIN) 875-125 MG per tablet 1 tablet        1 tablet Oral Every 12 hours 09/20/22 0911 10/18/22 0959   09/17/22 0821  vancomycin (VANCOCIN) powder  Status:  Discontinued          As needed 09/17/22 0822 09/17/22 0840   09/17/22 0640  ceFAZolin (ANCEF) 2-4 GM/100ML-% IVPB       Note to Pharmacy: Aquilla Hacker M: cabinet override      09/17/22 0640 09/17/22 0756   09/17/22 0600  ceFAZolin (ANCEF) IVPB 2g/100 mL premix        2 g 200 mL/hr over 30 Minutes Intravenous On call to O.R. 09/16/22 1944 09/17/22 0820   09/14/22 2200  vancomycin (VANCOREADY) IVPB 1250 mg/250 mL  Status:  Discontinued        1,250 mg 166.7 mL/hr over 90 Minutes Intravenous Every 24 hours 09/14/22 1054 09/19/22 0859   09/14/22 1930  vancomycin (VANCOREADY) IVPB 1500 mg/300 mL  Status:  Discontinued        1,500 mg 150 mL/hr over 120 Minutes Intravenous Every 24 hours 09/13/22 2048 09/14/22 1054   09/14/22 1630  ceFEPIme (MAXIPIME) 2 g in sodium chloride 0.9 % 100 mL IVPB  Status:  Discontinued        2 g 200 mL/hr over 30 Minutes Intravenous Every 8 hours 09/14/22 1054 09/20/22 0911   09/13/22 2030  ceFEPIme (MAXIPIME) 2 g in sodium chloride 0.9 % 100  mL IVPB  Status:  Discontinued        2 g 200 mL/hr over 30 Minutes Intravenous Every 12 hours 09/13/22 2015 09/14/22 1054   09/13/22 2030  metroNIDAZOLE (FLAGYL) IVPB 500 mg  Status:  Discontinued        500 mg 100 mL/hr over 60 Minutes Intravenous Every 12 hours 09/13/22 2015 09/20/22 0911   09/13/22 1745  vancomycin (VANCOCIN) IVPB 1000 mg/200 mL premix  Status:  Discontinued        1,000 mg 200  mL/hr over 60 Minutes Intravenous  Once 09/13/22 1735 09/13/22 1742   09/13/22 1745  vancomycin (VANCOREADY) IVPB 1750 mg/350 mL        1,750 mg 175 mL/hr over 120 Minutes Intravenous  Once 09/13/22 1742 09/13/22 2105        DVT prophylaxis: SCDs  Code Status: Full code  Family Communication: No family at bedside   CONSULTS orthopedics   Subjective   No new complaints   Objective    Physical Examination:  General-appears in no acute distress Heart-S1-S2, regular, no murmur auscultated Lungs-clear to auscultation bilaterally, no wheezing or crackles auscultated Abdomen-soft, nontender, no organomegaly Extremities-no edema in the lower extremities, s/p left BKA Neuro-alert, oriented x3, no focal deficit noted   Status is: Inpatient:          Meredeth Ide   [facility] If 7PM-7AM, please contact night-coverage at [email or web address], Office  [phone]   09/23/2022, 11:06 AM  LOS: 10 days

## 2022-09-24 DIAGNOSIS — M869 Osteomyelitis, unspecified: Secondary | ICD-10-CM | POA: Diagnosis not present

## 2022-09-24 DIAGNOSIS — E871 Hypo-osmolality and hyponatremia: Secondary | ICD-10-CM | POA: Diagnosis not present

## 2022-09-24 DIAGNOSIS — Z89612 Acquired absence of left leg above knee: Secondary | ICD-10-CM | POA: Diagnosis not present

## 2022-09-24 DIAGNOSIS — N19 Unspecified kidney failure: Secondary | ICD-10-CM | POA: Diagnosis not present

## 2022-09-24 LAB — GLUCOSE, CAPILLARY
Glucose-Capillary: 116 mg/dL — ABNORMAL HIGH (ref 70–99)
Glucose-Capillary: 140 mg/dL — ABNORMAL HIGH (ref 70–99)
Glucose-Capillary: 161 mg/dL — ABNORMAL HIGH (ref 70–99)
Glucose-Capillary: 204 mg/dL — ABNORMAL HIGH (ref 70–99)
Glucose-Capillary: 226 mg/dL — ABNORMAL HIGH (ref 70–99)
Glucose-Capillary: 229 mg/dL — ABNORMAL HIGH (ref 70–99)

## 2022-09-24 NOTE — NC FL2 (Signed)
[place] MEDICAID FL2 LEVEL OF CARE FORM     IDENTIFICATION  Patient Name: Grant Yang Birthdate: 06-Aug-1955 Sex: male Admission Date (Current Location): 09/13/2022  [facility] and [place] Number:  Producer, television/film/video and Address:  [facility] [place], [address], [place], [place] [address]      Provider Number: [id number]  Attending Physician Name and Address:  Meredeth Ide, MD  Relative Name and Phone Number:       Current Level of Care: Hospital Recommended Level of Care: Skilled Nursing Facility Prior Approval Number:    Date Approved/Denied:   PASRR Number: [id number]  Discharge Plan: SNF    Current Diagnoses: Patient Active Problem List   Diagnosis Date Noted   Severe protein-calorie malnutrition (HCC) 09/14/2022   Osteomyelitis of left lower extremity (HCC) 09/13/2022   Severe sepsis (HCC) 09/13/2022   Penetrating foot wound, left, initial encounter 09/13/2022   AKI (acute kidney injury) (HCC) 09/13/2022   High anion gap metabolic acidosis 09/13/2022   Dehydration 09/13/2022   Acute prerenal azotemia 09/13/2022   DM2 (diabetes mellitus, type 2) (HCC) 09/13/2022   Osteomyelitis of foot, left, acute (HCC) 09/04/2022   PAD (peripheral artery disease) (HCC) 08/16/2022   Hospital discharge follow-up 08/16/2022   Foot ulcer, left (HCC) 07/30/2022   History of CVA (cerebrovascular accident) 07/05/2022   Medication management 06/27/2022   Acute osteomyelitis of left foot (HCC) 05/30/2022   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) 05/30/2022   Cellulitis of left leg 05/29/2022   COPD suggested by initial evaluation (HCC) 05/29/2022   Prostate cancer (HCC) 01/30/2017   Cellulitis of left hand 02/01/17 I and D 01/30/2017   Essential hypertension 01/30/2017   Uncontrolled type 2 diabetes mellitus with hyperglycemia (HCC) 01/30/2017   Acute hyponatremia 12/29/2010    Orientation RESPIRATION BLADDER Height &  Weight     Self, Situation, Time, Place  Normal Continent Weight: 196 lb 6.9 oz (89.1 kg) Height:  5' 10.98" (180.3 cm)  BEHAVIORAL SYMPTOMS/MOOD NEUROLOGICAL BOWEL NUTRITION STATUS      Continent Diet (See DC summary)  AMBULATORY STATUS COMMUNICATION OF NEEDS Skin   Extensive Assist Verbally Normal, Surgical wounds                       Personal Care Assistance Level of Assistance  Bathing, Feeding, Dressing Bathing Assistance: Limited assistance Feeding assistance: Independent Dressing Assistance: Limited assistance     Functional Limitations Info  Sight, Hearing, Speech Sight Info: Adequate Hearing Info: Adequate Speech Info: Adequate    SPECIAL CARE FACTORS FREQUENCY  PT (By licensed PT), OT (By licensed OT)     PT Frequency: 5x a week OT Frequency: 5x a week            Contractures Contractures Info: Not present    Additional Factors Info  Code Status, Allergies Code Status Info: Full Allergies Info: NKA           Current Medications (09/24/2022):  This is the current hospital active medication list Current Facility-Administered Medications  Medication Dose Route Frequency Provider Last Rate Last Admin   0.9 %  sodium chloride infusion   Intravenous Continuous Nadara Mustard, MD       acetaminophen (TYLENOL) tablet 650 mg  650 mg Oral Q6H PRN Nadara Mustard, MD   650 mg at 09/19/22 2043   Or   acetaminophen (TYLENOL) suppository 650 mg  650 mg Rectal  Q6H PRN Nadara Mustard, MD       alum & mag hydroxide-simeth (MAALOX/MYLANTA) 200-200-20 MG/5ML suspension 15-30 mL  15-30 mL Oral Q2H PRN Nadara Mustard, MD       amoxicillin-clavulanate (AUGMENTIN) 875-125 MG per tablet 1 tablet  1 tablet Oral Q12H Odette Fraction, MD   1 tablet at 09/24/22 1610   ascorbic acid (VITAMIN C) tablet 1,000 mg  1,000 mg Oral Daily Nadara Mustard, MD   1,000 mg at 09/24/22 0844   bisacodyl (DULCOLAX) EC tablet 5 mg  5 mg Oral Daily PRN Nadara Mustard, MD       docusate  sodium (COLACE) capsule 100 mg  100 mg Oral Daily Nadara Mustard, MD   100 mg at 09/24/22 0844   feeding supplement (GLUCERNA SHAKE) (GLUCERNA SHAKE) liquid 237 mL  237 mL Oral BID BM Meredeth Ide, MD   237 mL at 09/24/22 1206   fentaNYL (SUBLIMAZE) injection 25 mcg  25 mcg Intravenous Q2H PRN Nadara Mustard, MD       guaiFENesin-dextromethorphan (ROBITUSSIN DM) 100-10 MG/5ML syrup 15 mL  15 mL Oral Q4H PRN Nadara Mustard, MD       hydrALAZINE (APRESOLINE) injection 5 mg  5 mg Intravenous Q20 Min PRN Nadara Mustard, MD       HYDROmorphone (DILAUDID) injection 0.5-1 mg  0.5-1 mg Intravenous Q4H PRN Nadara Mustard, MD       insulin aspart (novoLOG) injection 0-9 Units  0-9 Units Subcutaneous TID WC Meredeth Ide, MD   3 Units at 09/24/22 1202   insulin aspart protamine- aspart (NOVOLOG MIX 70/30) injection 5 Units  5 Units Subcutaneous BID WC Meredeth Ide, MD   5 Units at 09/24/22 0846   labetalol (NORMODYNE) injection 10 mg  10 mg Intravenous Q10 min PRN Nadara Mustard, MD       magnesium citrate solution 1 Bottle  1 Bottle Oral Once PRN Nadara Mustard, MD       magnesium sulfate IVPB 2 g 50 mL  2 g Intravenous Daily PRN Nadara Mustard, MD       melatonin tablet 3 mg  3 mg Oral QHS PRN Nadara Mustard, MD   3 mg at 09/23/22 2131   metoprolol tartrate (LOPRESSOR) injection 2-5 mg  2-5 mg Intravenous Q2H PRN Nadara Mustard, MD       naloxone [facility]) injection 0.4 mg  0.4 mg Intravenous PRN Nadara Mustard, MD       nutrition supplement (JUVEN) (JUVEN) powder packet 1 packet  1 packet Oral BID BM Nadara Mustard, MD   1 packet at 09/24/22 1206   ondansetron (ZOFRAN) injection 4 mg  4 mg Intravenous Q6H PRN Nadara Mustard, MD       oxyCODONE (Oxy IR/ROXICODONE) immediate release tablet 10-15 mg  10-15 mg Oral Q4H PRN Nadara Mustard, MD   10 mg at 09/22/22 2108   oxyCODONE (Oxy IR/ROXICODONE) immediate release tablet 5 mg  5 mg Oral Q6H PRN Nadara Mustard, MD       oxyCODONE (Oxy IR/ROXICODONE) immediate  release tablet 5-10 mg  5-10 mg Oral Q4H PRN Nadara Mustard, MD   10 mg at 09/21/22 2152   pantoprazole (PROTONIX) EC tablet 40 mg  40 mg Oral Daily Nadara Mustard, MD   40 mg at 09/24/22 0844   phenol (CHLORASEPTIC) mouth spray 1 spray  1 spray Mouth/Throat PRN Aldean Baker  V, MD       polyethylene glycol (MIRALAX / GLYCOLAX) packet 17 g  17 g Oral Daily PRN Nadara Mustard, MD       potassium chloride SA (KLOR-CON M) CR tablet 20-40 mEq  20-40 mEq Oral Daily PRN Nadara Mustard, MD       zinc sulfate capsule 220 mg  220 mg Oral Daily Nadara Mustard, MD   220 mg at 09/24/22 4782     Discharge Medications: Please see discharge summary for a list of discharge medications.  Relevant Imaging Results:  Relevant Lab Results:   Additional Information SSN [id number]  Jimmy Picket, [place]

## 2022-09-24 NOTE — Progress Notes (Signed)
[facility]  PROGRESS NOTE  GARRITT MOLYNEUX ZOX:[id number] DOB: 1955-07-20 DOA: 09/13/2022 PCP: Billie Lade, MD   Brief HPI:   67 year old Caucasian male with a past medical history significant for but not limited to uncontrolled diabetes mellitus type 2, essential hypertension who presents with left foot pain and has a diabetic foot ulcer with osteomyelitis. He presented to [facility] ED on 09/04/2022 complaining of a worsening left foot wound and underwent an MRI which is notable for a large skin wound at the lateral aspect concerning for abscess and osteomyelitis at that time. He was recommended for admission however he subsequently left AMA from the ED and did not seek medical care until presenting to the [facility] ED. Patient reports further progression of left foot discomfort along with worsening of the left lateral foot wound and progressive erythema. Orthopedic surgery was consulted and they are planning on a transtibial amputation on Sunday. Further workup also reveals that he has a gram-negative bacteremia with Proteus in the setting of his diabetic foot infection. 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 Augmentin to complete 4 weeks of duration from 7/28 end of treatment 8/24 -[employer] has denied CIR -Patient has decided to go to skilled nursing facility for  rehab  Diarrhea -Improved, likely antibiotic induced  Hyponatremia -Sodium is improved to 132 -Serum osmolality 288 -Urine osmolality 588 -?  SIADH   Diabetes mellitus type 2 -Continue sliding scale insulin with NovoLog -Will start insulin 70/30 5 units subcu twice daily -CBG well-controlled  Hypertension -Blood pressure is stable  Hypophosphatemia -Replete  Normocytic anemia -Iron saturation 9%, B12 602 -Likely in setting of above  Hypoalbuminemia -Will consult nutrition  ?  Depression -Nursing staff is concerned that patient may be depressed -Discussed with patient, denies depression, denies suicidal ideation  Medications     amoxicillin-clavulanate  1 tablet Oral Q12H   vitamin C  1,000 mg Oral Daily   docusate sodium  100 mg Oral Daily   feeding supplement (GLUCERNA SHAKE)  237 mL Oral BID BM   insulin aspart  0-9 Units Subcutaneous TID WC   insulin aspart protamine- aspart  5 Units Subcutaneous BID WC   nutrition supplement (JUVEN)  1 packet Oral BID BM   pantoprazole  40 mg Oral Daily   zinc sulfate  220 mg Oral Daily     Data Reviewed:   CBG:  Recent Labs  Lab 09/23/22 0931 09/23/22 1316 09/23/22 1612 09/24/22 0011 09/24/22 0550  GLUCAP 233* 219* 256* 204* 229*    SpO2: 99 %    Vitals:   09/23/22 1424 09/23/22 2024 09/24/22 0400 09/24/22 0735  BP: 109/66 136/78 (!) 144/74 132/73  Pulse: 71 80 70 67  Resp:  18 16 18   Temp: 97.9 F (36.6 C) 97.9 F (36.6 C)  97.8 F (36.6 C)  TempSrc: Oral Oral  Oral  SpO2: 97% 98% 98% 99%  Weight:  Height:          Data Reviewed:  Basic Metabolic Panel: Recent Labs  Lab 09/18/22 0258 09/19/22 0205 09/21/22 0716  NA 130* 131* 132*  K 3.5 3.7 3.6  CL 98 100 98  CO2 23 24 27   GLUCOSE 193* 224* 213*  BUN 16 16 13   CREATININE 0.82 0.73 0.63  CALCIUM 8.0* 7.9* 8.1*  MG 2.1 2.0  --   PHOS 2.9 2.9  --     CBC: Recent Labs  Lab 09/18/22 0258 09/19/22 0205 09/21/22 0716  WBC  24.3* 19.8* 14.4*  NEUTROABS 19.2* 15.3*  --   HGB 8.1* 8.6* 8.9*  HCT 25.6* 27.3* 26.6*  MCV 80.3 81.7 81.6  PLT 588* 699* 729*    LFT Recent Labs  Lab 09/18/22 0258 09/19/22 0205 09/21/22 0716  AST 25 17 17   ALT 14 14 15   ALKPHOS 95 89 77  BILITOT 0.3 0.5 0.4  PROT 5.7* 6.2* 6.1*  ALBUMIN <1.5* <1.5* <1.5*     Antibiotics: Anti-infectives (From admission, onward)    Start     Dose/Rate Route Frequency Ordered Stop   09/20/22 1000  amoxicillin-clavulanate (AUGMENTIN) 875-125 MG per tablet 1 tablet        1 tablet Oral Every 12 hours 09/20/22 0911 10/18/22 0959   09/17/22 0821  vancomycin (VANCOCIN) powder  Status:  Discontinued          As needed 09/17/22 0822 09/17/22 0840   09/17/22 0640  ceFAZolin (ANCEF) 2-4 GM/100ML-% IVPB       Note to Pharmacy: Aquilla Hacker M: cabinet override      09/17/22 0640 09/17/22 0756   09/17/22 0600  ceFAZolin (ANCEF) IVPB 2g/100 mL premix        2 g 200 mL/hr over 30 Minutes Intravenous On call to O.R. 09/16/22 1944 09/17/22 0820   09/14/22 2200  vancomycin (VANCOREADY) IVPB 1250 mg/250 mL  Status:  Discontinued        1,250 mg 166.7 mL/hr over 90 Minutes Intravenous Every 24 hours 09/14/22 1054 09/19/22 0859   09/14/22 1930  vancomycin (VANCOREADY) IVPB 1500 mg/300 mL  Status:  Discontinued        1,500 mg 150 mL/hr over 120 Minutes Intravenous Every 24 hours 09/13/22 2048 09/14/22 1054   09/14/22 1630  ceFEPIme (MAXIPIME) 2 g in sodium chloride 0.9 % 100 mL IVPB  Status:  Discontinued        2 g 200 mL/hr over 30 Minutes Intravenous Every 8 hours 09/14/22 1054 09/20/22 0911   09/13/22 2030  ceFEPIme (MAXIPIME) 2 g in sodium chloride 0.9 % 100 mL IVPB  Status:  Discontinued        2 g 200 mL/hr over 30 Minutes Intravenous Every 12 hours 09/13/22 2015 09/14/22 1054   09/13/22 2030  metroNIDAZOLE (FLAGYL) IVPB 500 mg  Status:  Discontinued        500 mg 100 mL/hr over 60 Minutes Intravenous Every 12 hours 09/13/22 2015 09/20/22 0911    09/13/22 1745  vancomycin (VANCOCIN) IVPB 1000 mg/200 mL premix  Status:  Discontinued        1,000 mg 200 mL/hr over 60 Minutes Intravenous  Once 09/13/22 1735 09/13/22 1742   09/13/22 1745  vancomycin (VANCOREADY) IVPB 1750 mg/350 mL        1,750 mg 175 mL/hr over 120 Minutes Intravenous  Once 09/13/22 1742 09/13/22 2105        DVT prophylaxis: SCDs  Code Status: Full code  Family  Communication: No family at bedside   CONSULTS orthopedics   Subjective    Denies any pain  Objective    Physical Examination:  General-appears in no acute distress Heart-S1-S2, regular, no murmur auscultated Lungs-clear to auscultation bilaterally, no wheezing or crackles auscultated Abdomen-soft, nontender, no organomegaly Extremities-no edema in the lower extremities Neuro-alert, oriented x3, no focal deficit noted   Status is: Inpatient:          Meredeth Ide   [facility] If 7PM-7AM, please contact night-coverage at [email or web address], Office  [phone]   09/24/2022, 11:28 AM  LOS: 11 days

## 2022-09-24 NOTE — TOC Progression Note (Signed)
Transition of Care [facility]) - Progression Note    Patient Details  Name: Grant Yang MRN: [id number] Date of Birth: 20-Jun-1955  [facility]) CM/SW Contact  Jimmy Picket, [place] Phone Number: 09/24/2022, 12:38 PM  Clinical Narrative:     CSW spoke with pt on the phone regarding SNF placement. Member is open to it but is still hoping to DC home if able. Patient was agreeable to faxing out to SNF's, and would prefer to stay in the [place] area but open to [place].    Expected Discharge Plan: Home w Home Health Services Barriers to Discharge: Continued Medical Work up  Expected Discharge Plan and Services   Discharge Planning Services: CM Consult   Living arrangements for the past 2 months: Single Family Home                 DME Arranged: (P) Bedside commode, Walker rolling DME Agency: (P) [employer] Date DME Agency Contacted: (P) 09/21/22 Time DME Agency Contacted: (P) [phone] Representative spoke with at DME Agency: (P) [facility] HH Arranged: (P) PT, OT HH Agency: (P) [facility] Date HH Agency Contacted: (P) 09/21/22 Time HH Agency Contacted: (P) [phone] Representative spoke with at [facility] Agency: (P) Tresa Endo   Social Determinants of Health (SDOH) Interventions SDOH Screenings   Food Insecurity: No Food Insecurity (07/30/2022)  Housing: Low Risk  (07/30/2022)  Transportation Needs: No Transportation Needs (07/30/2022)  Utilities: Not At Risk (07/30/2022)  Depression (PHQ2-9): Low Risk  (09/04/2022)  Tobacco Use: Medium Risk (09/17/2022)    Readmission Risk Interventions     No data to display

## 2022-09-24 NOTE — Progress Notes (Signed)
Occupational Therapy Treatment Patient Details Name: Grant Yang MRN: [id number] DOB: Feb 23, 1955 Today's Date: 09/24/2022   History of present illness Admitted with L LE infection necesitating transtibial amputation;  has a past medical history of Cancer (HCC), Diabetes mellitus, GERD (gastroesophageal reflux disease), History of kidney stones, Hypertension, Neuropathy in diabetes (HCC), Shortness of breath, and Stroke (HCC) (2010).   OT comments  Patient demonstrating good gains with setup to supervision for bathing and grooming seated on EOB. Patient demonstrated doffing limb protector with supervision. RW for transfer to recliner with min guard assist for safety. Patient will benefit from intensive inpatient follow up therapy, >3 hours/day to increase independence and safety with self care and functional transfers to allow for safe discharge home alone. Acute OT to continue to follow.    Recommendations for follow up therapy are one component of a multi-disciplinary discharge planning process, led by the attending physician.  Recommendations may be updated based on patient status, additional functional criteria and insurance authorization.    Assistance Recommended at Discharge Frequent or constant Supervision/Assistance  Patient can return home with the following  A lot of help with walking and/or transfers;A little help with bathing/dressing/bathroom;Assistance with cooking/housework;Assist for transportation;Help with stairs or ramp for entrance   Equipment Recommendations  Other (comment) (defer)    Recommendations for Other Services      Precautions / Restrictions Precautions Precautions: Fall;Other (comment) Restrictions Weight Bearing Restrictions: Yes LLE Weight Bearing: Non weight bearing       Mobility Bed Mobility Overal bed mobility: Modified Independent             General bed mobility comments: Increased time    Transfers Overall transfer level: Needs  assistance Equipment used: Rolling walker (2 wheels) Transfers: Sit to/from Stand, Bed to chair/wheelchair/BSC Sit to Stand: Min guard     Step pivot transfers: Min guard     General transfer comment: min guard for safety     Balance Overall balance assessment: Needs assistance Sitting-balance support: Feet supported Sitting balance-Leahy Scale: Good Sitting balance - Comments: EOB for ADLs   Standing balance support: During functional activity Standing balance-Leahy Scale: Fair Standing balance comment: reliant on UE support                           ADL either performed or assessed with clinical judgement   ADL Overall ADL's : Needs assistance/impaired     Grooming: Set up;Sitting Grooming Details (indicate cue type and reason): on EOB Upper Body Bathing: Set up;Sitting Upper Body Bathing Details (indicate cue type and reason): on EOB Lower Body Bathing: Supervison/ safety;Sitting/lateral leans   Upper Body Dressing : Set up;Sitting Upper Body Dressing Details (indicate cue type and reason): donned gown Lower Body Dressing: Sitting/lateral leans;Supervision/safety Lower Body Dressing Details (indicate cue type and reason): to donn/doff limp protector                    Extremity/Trunk Assessment              Vision       Perception     Praxis      Cognition Arousal/Alertness: Awake/alert Behavior During Therapy: WFL for tasks assessed/performed Overall Cognitive Status: Within Functional Limits for tasks assessed  Exercises      Shoulder Instructions       General Comments      Pertinent Vitals/ Pain       Pain Assessment Pain Assessment: No/denies pain Pain Intervention(s): Monitored during session  Home Living                                          Prior Functioning/Environment              Frequency  Min 1X/week        Progress  Toward Goals  OT Goals(current goals can now be found in the care plan section)  Progress towards OT goals: Progressing toward goals  Acute Rehab OT Goals Patient Stated Goal: go home OT Goal Formulation: With patient Time For Goal Achievement: 10/03/22 Potential to Achieve Goals: Good ADL Goals Pt Will Perform Lower Body Dressing: with set-up;sitting/lateral leans Pt Will Transfer to Toilet: with supervision;regular height toilet Additional ADL Goal #1: Pt will be able to don/doff LLE protector independently  Plan Discharge plan remains appropriate;Frequency remains appropriate    Co-evaluation                 AM-PAC OT "6 Clicks" Daily Activity     Outcome Measure   Help from another person eating meals?: None Help from another person taking care of personal grooming?: A Little Help from another person toileting, which includes using toliet, bedpan, or urinal?: A Little Help from another person bathing (including washing, rinsing, drying)?: A Little Help from another person to put on and taking off regular upper body clothing?: A Little Help from another person to put on and taking off regular lower body clothing?: A Little 6 Click Score: 19    End of Session Equipment Utilized During Treatment: Gait belt;Rolling walker (2 wheels)  OT Visit Diagnosis: Unsteadiness on feet (R26.81);Other abnormalities of gait and mobility (R26.89);Pain   Activity Tolerance Patient tolerated treatment well   Patient Left in chair;with call bell/phone within reach;with chair alarm set   Nurse Communication Mobility status        Time: 1610-9604 OT Time Calculation (min): 25 min  Charges: OT General Charges $OT Visit: 1 Visit OT Treatments $Self Care/Home Management : 23-37 mins  Alfonse Flavors, OTA Acute Rehabilitation Services  Office [phone]   Dewain Penning 09/24/2022, 9:14 AM

## 2022-09-25 DIAGNOSIS — N19 Unspecified kidney failure: Secondary | ICD-10-CM | POA: Diagnosis not present

## 2022-09-25 DIAGNOSIS — M869 Osteomyelitis, unspecified: Secondary | ICD-10-CM | POA: Diagnosis not present

## 2022-09-25 DIAGNOSIS — E860 Dehydration: Secondary | ICD-10-CM | POA: Diagnosis not present

## 2022-09-25 DIAGNOSIS — E1142 Type 2 diabetes mellitus with diabetic polyneuropathy: Secondary | ICD-10-CM | POA: Diagnosis not present

## 2022-09-25 LAB — GLUCOSE, CAPILLARY
Glucose-Capillary: 188 mg/dL — ABNORMAL HIGH (ref 70–99)
Glucose-Capillary: 238 mg/dL — ABNORMAL HIGH (ref 70–99)
Glucose-Capillary: 183 mg/dL — ABNORMAL HIGH (ref 70–99)

## 2022-09-25 MED ORDER — OXYCODONE HCL 5 MG PO TABS: 5.0000 mg | ORAL_TABLET | Freq: Four times a day (QID) | ORAL | 0 refills | Status: DC | PRN

## 2022-09-25 MED ORDER — ZINC SULFATE 220 (50 ZN) MG PO CAPS: 220.0000 mg | ORAL_CAPSULE | Freq: Every day | ORAL | 0 refills | Status: AC

## 2022-09-25 MED ORDER — AMOXICILLIN-POT CLAVULANATE 875-125 MG PO TABS: 1.0000 | ORAL_TABLET | Freq: Two times a day (BID) | ORAL | 0 refills | Status: AC

## 2022-09-25 MED ORDER — PANTOPRAZOLE SODIUM 40 MG PO TBEC: 40.0000 mg | DELAYED_RELEASE_TABLET | Freq: Every day | ORAL | 0 refills | Status: DC

## 2022-09-25 MED ORDER — ASCORBIC ACID 1000 MG PO TABS: 1000.0000 mg | ORAL_TABLET | Freq: Every day | ORAL | 0 refills | Status: AC

## 2022-09-25 NOTE — Care Management Important Message (Signed)
Important Message  Patient Details  Name: Grant Yang MRN: [id number] Date of Birth: May 19, 1955   Medicare Important Message Given:  Yes     Sherilyn Banker 09/25/2022, 1:13 PM

## 2022-09-25 NOTE — Plan of Care (Signed)
  Problem: Education: Goal: Ability to describe self-care measures that may prevent or decrease complications (Diabetes Survival Skills Education) will improve Outcome: Adequate for Discharge Goal: Individualized Educational Video(s) Outcome: Adequate for Discharge   Problem: Coping: Goal: Ability to adjust to condition or change in health will improve Outcome: Adequate for Discharge   Problem: Fluid Volume: Goal: Ability to maintain a balanced intake and output will improve Outcome: Adequate for Discharge   Problem: Health Behavior/Discharge Planning: Goal: Ability to identify and utilize available resources and services will improve Outcome: Adequate for Discharge Goal: Ability to manage health-related needs will improve Outcome: Adequate for Discharge   Problem: Metabolic: Goal: Ability to maintain appropriate glucose levels will improve Outcome: Adequate for Discharge   Problem: Nutritional: Goal: Maintenance of adequate nutrition will improve Outcome: Adequate for Discharge Goal: Progress toward achieving an optimal weight will improve Outcome: Adequate for Discharge   Problem: Skin Integrity: Goal: Risk for impaired skin integrity will decrease Outcome: Adequate for Discharge   Problem: Tissue Perfusion: Goal: Adequacy of tissue perfusion will improve Outcome: Adequate for Discharge   Problem: Education: Goal: Knowledge of General Education information will improve Description: Including pain rating scale, medication(s)/side effects and non-pharmacologic comfort measures Outcome: Adequate for Discharge   Problem: Health Behavior/Discharge Planning: Goal: Ability to manage health-related needs will improve Outcome: Adequate for Discharge   Problem: Clinical Measurements: Goal: Ability to maintain clinical measurements within normal limits will improve Outcome: Adequate for Discharge Goal: Will remain free from infection Outcome: Adequate for Discharge Goal:  Diagnostic test results will improve Outcome: Adequate for Discharge Goal: Respiratory complications will improve Outcome: Adequate for Discharge Goal: Cardiovascular complication will be avoided Outcome: Adequate for Discharge   Problem: Activity: Goal: Risk for activity intolerance will decrease Outcome: Adequate for Discharge   Problem: Nutrition: Goal: Adequate nutrition will be maintained Outcome: Adequate for Discharge   Problem: Coping: Goal: Level of anxiety will decrease Outcome: Adequate for Discharge   Problem: Elimination: Goal: Will not experience complications related to bowel motility Outcome: Adequate for Discharge Goal: Will not experience complications related to urinary retention Outcome: Adequate for Discharge   Problem: Pain Managment: Goal: General experience of comfort will improve Outcome: Adequate for Discharge   Problem: Safety: Goal: Ability to remain free from injury will improve Outcome: Adequate for Discharge   Problem: Skin Integrity: Goal: Risk for impaired skin integrity will decrease Outcome: Adequate for Discharge   Problem: Education: Goal: Knowledge of the prescribed therapeutic regimen will improve Outcome: Adequate for Discharge Goal: Ability to verbalize activity precautions or restrictions will improve Outcome: Adequate for Discharge Goal: Understanding of discharge needs will improve Outcome: Adequate for Discharge   Problem: Activity: Goal: Ability to perform//tolerate increased activity and mobilize with assistive devices will improve Outcome: Adequate for Discharge   Problem: Clinical Measurements: Goal: Postoperative complications will be avoided or minimized Outcome: Adequate for Discharge   Problem: Self-Care: Goal: Ability to meet self-care needs will improve Outcome: Adequate for Discharge   Problem: Self-Concept: Goal: Ability to maintain and perform role responsibilities to the fullest extent possible will  improve Outcome: Adequate for Discharge   Problem: Pain Management: Goal: Pain level will decrease with appropriate interventions Outcome: Adequate for Discharge   Problem: Skin Integrity: Goal: Demonstration of wound healing without infection will improve Outcome: Adequate for Discharge   

## 2022-09-25 NOTE — Progress Notes (Incomplete)
[facility]  PROGRESS NOTE  Grant Yang QIO:[id number] DOB: [birth year] DOA: 09/13/2022 PCP: Billie Lade, MD   Brief HPI:   67 year old Caucasian male with a past medical history significant for but not limited to uncontrolled diabetes mellitus type 2, essential hypertension who presents with left foot pain and has a diabetic foot ulcer with osteomyelitis. He presented to [facility] ED on 09/04/2022 complaining of a worsening left foot wound and underwent an MRI which is notable for a large skin wound at the lateral aspect concerning for abscess and osteomyelitis at that time. He was recommended for admission however he subsequently left AMA from the ED and did not seek medical care until presenting to the [facility] ED. Patient reports further progression of left foot discomfort along with worsening of the left lateral foot wound and progressive erythema. Orthopedic surgery was consulted and they are planning on a transtibial amputation on Sunday. Further workup also reveals that he has a gram-negative bacteremia with Proteus in the setting of his diabetic foot infection. 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 Augmentin to complete 4 weeks of duration from 7/28 end of treatment 8/24 -[employer] has denied CIR -Patient has decided to go to skilled nursing facility for  rehab  Diarrhea -Improved, likely antibiotic induced  Hyponatremia -Sodium is improved to 132 -Serum osmolality 288 -Urine osmolality 588 -?  SIADH   Diabetes mellitus type 2 -Continue sliding scale insulin with NovoLog -Will start insulin 70/30 5 units subcu twice daily -CBG well-controlled  Hypertension -Blood pressure is stable  Hypophosphatemia -Replete  Normocytic anemia -Iron saturation 9%, B12 602 -Likely in setting of above  Hypoalbuminemia -Will consult nutrition  ?  Depression -Nursing staff is concerned that patient may be depressed -Discussed with patient, denies depression, denies suicidal ideation  Medications     amoxicillin-clavulanate  1 tablet Oral Q12H   vitamin C  1,000 mg Oral Daily   docusate sodium  100 mg Oral Daily   feeding supplement (GLUCERNA SHAKE)  237 mL Oral BID BM   insulin aspart  0-9 Units Subcutaneous TID WC   insulin aspart protamine- aspart  5 Units Subcutaneous BID WC   nutrition supplement (JUVEN)  1 packet Oral BID BM   pantoprazole  40 mg Oral Daily   zinc sulfate  220 mg Oral Daily     Data Reviewed:   CBG:  Recent Labs  Lab 09/24/22 1606 09/24/22 1943 09/24/22 2356 09/25/22 0545 09/25/22 0737  GLUCAP 140* 116* 161* 188* 238*    SpO2: 100 %    Vitals:   09/24/22 1502 09/24/22 1947 09/25/22 0545 09/25/22 0736  BP: 122/62 118/66 125/70 (!) 125/103  Pulse: 67 69 67 75  Resp:  16 18   Temp: 98 F (36.7 C) 98.3 F (36.8 C) 98 F (36.7 C) 97.6 F (36.4 C)  TempSrc: Oral Oral  Oral  SpO2: 98% 100% 97% 100%  Weight:   86 kg   Height:          Data Reviewed:  Basic Metabolic Panel: Recent Labs  Lab 09/19/22 0205 09/21/22 0716  NA 131* 132*  K 3.7 3.6  CL 100 98  CO2 24 27  GLUCOSE 224* 213*  BUN 16 13  CREATININE 0.73 0.63  CALCIUM 7.9* 8.1*  MG 2.0  --   PHOS 2.9  --     CBC: Recent Labs  Lab 09/19/22 0205 09/21/22 0716  WBC 19.8* 14.4*  NEUTROABS 15.3*  --   HGB 8.6* 8.9*   HCT 27.3* 26.6*  MCV 81.7 81.6  PLT 699* 729*    LFT Recent Labs  Lab 09/19/22 0205 09/21/22 0716  AST 17 17  ALT 14 15  ALKPHOS 89 77  BILITOT 0.5 0.4  PROT 6.2* 6.1*  ALBUMIN <1.5* <1.5*     Antibiotics: Anti-infectives (From admission, onward)    Start     Dose/Rate Route Frequency Ordered Stop   09/20/22 1000  amoxicillin-clavulanate (AUGMENTIN) 875-125 MG per tablet 1 tablet        1 tablet Oral Every 12 hours 09/20/22 0911 10/18/22 0959   09/17/22 0821  vancomycin (VANCOCIN) powder  Status:  Discontinued          As needed 09/17/22 0822 09/17/22 0840   09/17/22 0640  ceFAZolin (ANCEF) 2-4 GM/100ML-% IVPB       Note to Pharmacy: Aquilla Hacker M: cabinet override      09/17/22 0640 09/17/22 0756   09/17/22 0600  ceFAZolin (ANCEF) IVPB 2g/100 mL premix        2 g 200 mL/hr over 30 Minutes Intravenous On call to O.R. 09/16/22 1944 09/17/22 0820   09/14/22 2200  vancomycin (VANCOREADY) IVPB 1250 mg/250 mL  Status:  Discontinued        1,250 mg 166.7 mL/hr over 90 Minutes Intravenous Every 24 hours 09/14/22 1054 09/19/22 0859   09/14/22 1930  vancomycin (VANCOREADY) IVPB 1500 mg/300 mL  Status:  Discontinued        1,500 mg 150 mL/hr over 120 Minutes Intravenous Every 24 hours 09/13/22 2048 09/14/22 1054   09/14/22 1630  ceFEPIme (MAXIPIME) 2 g in sodium chloride 0.9 % 100 mL IVPB  Status:  Discontinued        2 g 200 mL/hr over 30 Minutes Intravenous Every 8 hours 09/14/22 1054 09/20/22 0911   09/13/22 2030  ceFEPIme (MAXIPIME) 2 g in sodium chloride 0.9 % 100 mL IVPB  Status:  Discontinued        2 g 200 mL/hr over 30 Minutes Intravenous Every 12 hours 09/13/22 2015 09/14/22 1054   09/13/22 2030  metroNIDAZOLE (FLAGYL) IVPB 500 mg  Status:  Discontinued        500 mg 100 mL/hr over 60 Minutes Intravenous Every 12 hours 09/13/22 2015 09/20/22 0911   09/13/22 1745  vancomycin (VANCOCIN) IVPB 1000 mg/200 mL premix  Status:  Discontinued        1,000 mg 200 mL/hr over  60 Minutes Intravenous  Once 09/13/22 1735 09/13/22 1742   09/13/22 1745  vancomycin (VANCOREADY) IVPB 1750 mg/350 mL        1,750 mg 175 mL/hr over 120 Minutes Intravenous  Once 09/13/22 1742 09/13/22 2105        DVT prophylaxis: SCDs  Code Status: Full code  Family Communication: No family at bedside   CONSULTS orthopedics   Subjective     Objective    Physical  Examination:     Status is: Inpatient:          Meredeth Ide   [facility] If 7PM-7AM, please contact night-coverage at [email or web address], Office  [phone]   09/25/2022, 10:15 AM  LOS: 12 days

## 2022-09-25 NOTE — Progress Notes (Signed)
Patient discharged, discharge instructions given and explained. Patient states understanding and has no additional questions and or concerns at this time. Patient IV removed per protocol. Patient wound vac removed and replaced with dry dressing and Ace wrap, with stump shrink as ordered. Patient walker and bedside commode delivered to the room. Patient awaiting daughter to arrive to discharge patient home.

## 2022-09-25 NOTE — TOC Progression Note (Signed)
Transition of Care [facility]) - Progression Note    Patient Details  Name: Grant Yang MRN: [id number] Date of Birth: [birth year]  [facility] CM/SW Contact  Lorri Frederick, LCSW Phone Number: 09/25/2022, 11:27 AM  Clinical Narrative:   PT/OT both recommending DC home.  CSW spoke with Zain/PT who worked with pt earlier this AM, note not yet in, reports pt independent and able to DC home alone.  CSW spoke with pt, who is in agreement, would like to DC home.  Reports his son is concerned about this.  Pt able to call son, spoke to him on speakerphone.  Son reports pt home is not in good condition, leaks, floors not in good shape, he is concerned about this.  Discussed that SNF not medically necessary at this time, which would prevent authorization, discussed PT/OT recommendations for home.  Son not pleased but understands, currently at work until evening.  He has the key to the home.  Pt stating he can get a ride with someone else but does need key to get in.  Son will see what he can arrange.    Expected Discharge Plan: Home w Home Health Services Barriers to Discharge: Continued Medical Work up  Expected Discharge Plan and Services   Discharge Planning Services: CM Consult   Living arrangements for the past 2 months: Single Family Home                 DME Arranged: (P) Bedside commode, Walker rolling DME Agency: (P) [employer] Date DME Agency Contacted: (P) 09/21/22 Time DME Agency Contacted: (P) [phone] Representative spoke with at DME Agency: (P) [facility] HH Arranged: (P) PT, OT HH Agency: (P) [facility] Date HH Agency Contacted: (P) 09/21/22 Time HH Agency Contacted: (P) [phone] Representative spoke with at [facility] Agency: (P) T[facility] Endo   Social Determinants of Health (SDOH) Interventions SDOH Screenings   Food Insecurity: No Food Insecurity (07/30/2022)  Housing: Low Risk  (07/30/2022)  Transportation Needs: No Transportation Needs (07/30/2022)   Utilities: Not At Risk (07/30/2022)  Depression (PHQ2-9): Low Risk  (09/04/2022)  Tobacco Use: Medium Risk (09/17/2022)    Readmission Risk Interventions     No data to display

## 2022-09-25 NOTE — TOC Transition Note (Signed)
Transition of Care [facility]) - CM/SW Discharge Note   Patient Details  Name: Grant Yang MRN: [id number] Date of Birth: 23-Apr-1955  [facility]) CM/SW Contact:  Epifanio Lesches, RN Phone Number: 09/25/2022, 2:26 PM   Clinical Narrative:    Patient will DC to: home Anticipated DC date: 09/25/2022 Family notified: yes Transport by: car   Per MD patient ready for DC today with home health services. RN, patient, patient's family, and [facility] notified of DC. Pt states family / friends to assist / help once d/c.  Daughter to provide transportation to home. Post hospital f/u noted on AVS. DME delivered to patients room . Pt without RX med concerns.  RNCM will sign off for now as intervention is no longer needed. Please consult [place] again if new needs arise.    Final next level of care: Home w Home Health Services Barriers to Discharge: Continued Medical Work up   Patient Goals and CMS Choice   Choice offered to / list presented to : Patient  Discharge Placement                         Discharge Plan and Services Additional resources added to the After Visit Summary for     Discharge Planning Services: CM Consult            DME Arranged: (P) Bedside commode, Walker rolling DME Agency: (P) [employer] Date DME Agency Contacted: (P) 09/21/22 Time DME Agency Contacted: (P) [phone] Representative spoke with at DME Agency: (P) [facility] HH Arranged: (P) PT, OT HH Agency: (P) [facility] Date HH Agency Contacted: (P) 09/21/22 Time HH Agency Contacted: (P) [phone] Representative spoke with at [facility] Agency: (P) [facility] Endo  Social Determinants of Health (SDOH) Interventions SDOH Screenings   Food Insecurity: No Food Insecurity (07/30/2022)  Housing: Low Risk  (07/30/2022)  Transportation Needs: No Transportation Needs (07/30/2022)  Utilities: Not At Risk (07/30/2022)  Depression (PHQ2-9): Low Risk  (09/04/2022)  Tobacco Use: Medium Risk (09/17/2022)      Readmission Risk Interventions     No data to display

## 2022-09-25 NOTE — Discharge Summary (Signed)
Physician Discharge Summary   Patient: Grant Yang MRN: [id number] DOB: 05-12-1955  Admit date:     09/13/2022  Discharge date: 09/25/22  Discharge Physician: Meredeth Ide   PCP: Billie Lade, MD   Recommendations at discharge:   Follow-up orthopedics in 1 week Follow-up PCP as outpatient  Discharge Diagnoses: Principal Problem:   Osteomyelitis of left lower extremity (HCC) Active Problems:   Acute hyponatremia   Essential hypertension   Severe sepsis (HCC)   Penetrating foot wound, left, initial encounter   AKI (acute kidney injury) (HCC)   High anion gap metabolic acidosis   Dehydration   Acute prerenal azotemia   DM2 (diabetes mellitus, type 2) (HCC)   Severe protein-calorie malnutrition (HCC)  Resolved Problems:   * No resolved hospital problems. 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 Augmentin to complete 4 weeks of duration from 7/28 end of treatment 8/24 -[employer] has denied CIR -Patient was seen by PT again, at this time he stable to go home with home health PT.  Will discharge home with home health PT/OT/aide   Diarrhea -Improved, likely antibiotic induced   Hyponatremia -Sodium is improved to 132 -Serum osmolality 288 -Urine osmolality 588      Diabetes mellitus type 2 -Continue home regimen   Hypertension -Blood pressure is stable -Discontinue irbesartan   Hypophosphatemia -Replete   Normocytic anemia -Iron saturation 9%, B12 602 -Likely in setting of above       ?  Depression -Nursing staff is concerned that patient may be depressed -Discussed with patient, denies depression, denies suicidal ideation    Assessment and Plan: No notes have been filed under this hospital service. Service: Hospitalist        Consultants: Orthopedics Procedures performed: Above-knee amputation Disposition: Home Diet recommendation:  Discharge Diet Orders (From admission, onward)     Start     Ordered   09/25/22 0000  Diet - low sodium heart healthy        09/25/22 1217           Regular diet DISCHARGE MEDICATION: Allergies as of 09/25/2022   No Known Allergies      Medication List     STOP taking these medications    ibuprofen 200 MG tablet Commonly known as: ADVIL   metFORMIN 500 MG 24 hr tablet Commonly known as: GLUCOPHAGE-XR   olmesartan 20 MG tablet Commonly known as: BENICAR       TAKE these medications    albuterol 108 (90 Base) MCG/ACT inhaler Commonly known as: VENTOLIN HFA Inhale 2 puffs into the lungs every 6 (six) hours as needed for wheezing or shortness of breath.   amoxicillin-clavulanate 875-125 MG tablet Commonly known as: AUGMENTIN Take 1 tablet by mouth every 12 (twelve) hours for 19 days.   ascorbic acid 1000 MG tablet Commonly known as: VITAMIN C Take 1 tablet (1,000 mg total) by mouth daily. Start taking on: September 26, 2022   aspirin EC 81 MG tablet Take 1 tablet (81 mg total) by mouth daily. Swallow whole.  atorvastatin 40 MG tablet Commonly known as: LIPITOR Take 1 tablet (40 mg total) by mouth daily.   blood glucose meter kit and supplies Dispense based on patient and insurance preference. Use up to four times daily as directed. (FOR ICD-10 E10.9, E11.9).   clopidogrel 75 MG tablet Commonly known as: PLAVIX Take 1 tablet (75 mg total) by mouth daily with breakfast.   Dexcom G7 Sensor Misc 1 Units by Does not apply route See admin instructions. Change sensor after 10 days    EPINEPHrine 0.3 mg/0.3 mL Soaj injection Commonly known as: EPI-PEN Inject 0.3 mg into the muscle once as needed for up to 1 dose (if patient exhibits significant signs and symptoms of allergic reaction.).   gabapentin 300 MG capsule Commonly known as: NEURONTIN Take 1 capsule (300 mg total) by mouth 3 (three) times daily as needed. What changed: reasons to take this   insulin NPH-regular Human (70-30) 100 UNIT/ML injection Commonly known as: NovoLIN 70/30 Inject 10 Units into the skin 2 (two) times daily with a meal. What changed:  how much to take when to take this   Insulin Syringes (Disposable) U-100 0.3 ML Misc 10 Units by Does not apply route 2 (two) times daily with breakfast and lunch.   oxyCODONE 5 MG immediate release tablet Commonly known as: Roxicodone Take 1 tablet (5 mg total) by mouth every 6 (six) hours as needed.   pantoprazole 40 MG tablet Commonly known as: PROTONIX Take 1 tablet (40 mg total) by mouth daily. Start taking on: September 26, 2022   tirzepatide 2.5 MG/0.5ML Pen Commonly known as: MOUNJARO Inject 2.5 mg into the skin once a week.   zinc sulfate 220 (50 Zn) MG capsule Take 1 capsule (220 mg total) by mouth daily. Start taking on: September 26, 2022               Durable Medical Equipment  (From admission, onward)           Start     Ordered   09/21/22 1315  For home use only DME Bedside commode  Once       Comments: Confine to one room  Question:  Patient needs a bedside commode to treat with the following condition  Answer:  S/P BKA (below knee amputation) (HCC)   09/21/22 1314   09/21/22 1313  For home use only DME Walker rolling  Once       Question Answer Comment  Walker: With 5 Inch Wheels   Patient needs a walker to treat with the following condition S/P BKA (below knee amputation) (HCC)      09/21/22 1314              Discharge Care Instructions  (From admission, onward)           Start     Ordered    09/21/22 0000  Change dressing       Comments: At time of discharge remove the incisional wound VAC dressing apply 4 x 4 gauze plus ABD pad plus Ace wrap.  This should then be covered with the black stump shrinker.   09/21/22 1821            Follow-up Information     Billie Lade, MD Follow up.   Specialty: Internal Medicine Contact information: [address] [place] [address] [phone]         Nadara Mustard, MD Follow up in 1 week(s).   Specialty: Orthopedic  Surgery Contact information: [address] [place] [address] [phone]         [facility] Follow up.   Specialty: Home Health Services Why: home health PT and OT services will be provided by [facility] , start of care within 48 hours post discharge Contact information: [address] [place] [address] [phone]                Discharge Exam: Ceasar Mons Weights   09/20/22 0253 09/21/22 0500 09/25/22 0545  Weight: 92 kg 89.1 kg 86 kg   General-appears in no acute distress Heart-S1-S2, regular, no murmur auscultated Lungs-clear to auscultation bilaterally, no wheezing or crackles auscultated Abdomen-soft, nontender, no organomegaly Extremities-left BKA Neuro-alert, oriented x3, no focal deficit noted  Condition at discharge: good  The results of significant diagnostics from this hospitalization (including imaging, microbiology, ancillary and laboratory) are listed below for reference.   Imaging Studies: DG Chest Port 1 View  Result Date: 09/13/2022 CLINICAL DATA:  Questionable sepsis - evaluate for abnormality EXAM: PORTABLE CHEST 1 VIEW COMPARISON:  12/28/2010 FINDINGS: The heart size and mediastinal contours are within normal limits. Both lungs are clear. The visualized skeletal structures are unremarkable. IMPRESSION: No active disease. Electronically Signed   By: Charlett Nose M.D.   On: 09/13/2022 19:18   MR FOOT LEFT W WO  CONTRAST  Result Date: 09/04/2022 CLINICAL DATA:  Open wound left lateral foot. EXAM: MRI OF THE LEFT FOREFOOT WITHOUT AND WITH CONTRAST TECHNIQUE: Multiplanar, multisequence MR imaging of the left forefoot was performed both before and after administration of intravenous contrast. CONTRAST:  10mL GADAVIST GADOBUTROL 1 MMOL/ML IV SOLN COMPARISON:  Radiographs dated September 04, 2022 FINDINGS: Bones/Joint/Cartilage Postsurgical changes with prior amputation through the second metatarsal. Bone marrow edema of the cuboid, fifth metatarsal, base of the proximal phalanx of the fifth digit. There is marrow edema of the base of the 2nd-4th metatarsal without evidence of adjacent deep skin wound. Ligaments Lisfranc and collateral ligaments are intact. Muscles and Tendons Increased intramuscular signal of the plantar muscles suggesting myopathy/myositis. Soft tissues Large skin wound about the lateral aspect of the foot extending from the cuboid to the base of the fifth metatarsal with peripherally enhancing fluid collection measuring at least 1.7 x 1.2 x 5.7 cm. IMPRESSION: 1. Large skin wound about the lateral aspect of the foot extending from the cuboid to the base of the fifth metatarsal with peripherally enhancing fluid collection measuring at least 1.7 x 1.2 x 5.7 cm concerning for abscess. 2. Bone marrow edema of the cuboid, base of the fifth metatarsal, base of the proximal phalanx of the fifth digit concerning for osteomyelitis. 3. Mild marrow edema of the base of the 2nd-4th metatarsals without evidence of adjacent deep skin wound which may represent reactive edema secondary to altered mechanics. 4. Increased intramuscular signal of the plantar muscles suggesting myopathy/myositis. Electronically Signed   By: Larose Hires D.O.   On: 09/04/2022 15:49   DG Foot Complete Left  Result Date: 09/04/2022 CLINICAL DATA:  Left foot wound. EXAM: LEFT FOOT - COMPLETE 3+ VIEW COMPARISON:  July 30, 2022. FINDINGS: Status post  surgical amputation of distal portion of second metatarsal as well as second toe phalanges. Vascular calcifications are noted. Soft tissue wound is seen lateral to distal portion of fifth metatarsal. There is underlying lytic destruction of the distal head of fifth metatarsal suggesting osteomyelitis. Larger wound is seen more posteriorly along the lateral portion of the foot with  underlying lytic destruction of proximal base of fifth metatarsal. IMPRESSION: Two soft tissue wounds or ulcerations are noted along the lateral aspect of the left foot, with underlying lytic destruction of the proximal and distal portions of the fifth metatarsal consistent with osteomyelitis. Electronically Signed   By: Lupita Raider M.D.   On: 09/04/2022 12:42    Microbiology: Results for orders placed or performed during the hospital encounter of 09/13/22  Blood Culture (routine x 2)     Status: Abnormal   Collection Time: 09/13/22  7:20 PM   Specimen: BLOOD LEFT FOREARM  Result Value Ref Range Status   Specimen Description BLOOD LEFT FOREARM  Final   Special Requests   Final    BOTTLES DRAWN AEROBIC AND ANAEROBIC Blood Culture adequate volume   Culture  Setup Time   Final    GRAM NEGATIVE RODS IN BOTH AEROBIC AND ANAEROBIC BOTTLES Organism ID to follow CRITICAL RESULT CALLED TO, READ BACK BY AND VERIFIED WITHNorva Riffle PHARMD AT 1153 09/14/22 D. Leighton Roach Performed at [facility], [address], [place], [place] [address]    Culture PROTEUS MIRABILIS (A)  Final   Report Status 09/16/2022 FINAL  Final   Organism ID, Bacteria PROTEUS MIRABILIS  Final      Susceptibility   Proteus mirabilis - MIC*    AMPICILLIN <=2 SENSITIVE Sensitive     CEFEPIME <=0.12 SENSITIVE Sensitive     CEFTAZIDIME <=1 SENSITIVE Sensitive     CEFTRIAXONE <=0.25 SENSITIVE Sensitive     CIPROFLOXACIN <=0.25 SENSITIVE Sensitive     GENTAMICIN <=1 SENSITIVE Sensitive     IMIPENEM 4 SENSITIVE Sensitive     TRIMETH/SULFA <=20  SENSITIVE Sensitive     AMPICILLIN/SULBACTAM <=2 SENSITIVE Sensitive     PIP/TAZO <=4 SENSITIVE Sensitive     * PROTEUS MIRABILIS  Blood Culture ID Panel (Reflexed)     Status: Abnormal   Collection Time: 09/13/22  7:20 PM  Result Value Ref Range Status   Enterococcus faecalis NOT DETECTED NOT DETECTED Final   Enterococcus Faecium NOT DETECTED NOT DETECTED Final   Listeria monocytogenes NOT DETECTED NOT DETECTED Final   Staphylococcus species NOT DETECTED NOT DETECTED Final   Staphylococcus aureus (BCID) NOT DETECTED NOT DETECTED Final   Staphylococcus epidermidis NOT DETECTED NOT DETECTED Final   Staphylococcus lugdunensis NOT DETECTED NOT DETECTED Final   Streptococcus species NOT DETECTED NOT DETECTED Final   Streptococcus agalactiae NOT DETECTED NOT DETECTED Final   Streptococcus pneumoniae NOT DETECTED NOT DETECTED Final   Streptococcus pyogenes NOT DETECTED NOT DETECTED Final   A.calcoaceticus-baumannii NOT DETECTED NOT DETECTED Final   Bacteroides fragilis NOT DETECTED NOT DETECTED Final   Enterobacterales DETECTED (A) NOT DETECTED Final    Comment: Enterobacterales represent a large order of gram negative bacteria, not a single organism. CRITICAL RESULT CALLED TO, READ BACK BY AND VERIFIED WITH: E. SINCLAIR PHARMD AT 1153 09/14/22 D. VANHOOK    Enterobacter cloacae complex NOT DETECTED NOT DETECTED Final   Escherichia coli NOT DETECTED NOT DETECTED Final   Klebsiella aerogenes NOT DETECTED NOT DETECTED Final   Klebsiella oxytoca NOT DETECTED NOT DETECTED Final   Klebsiella pneumoniae NOT DETECTED NOT DETECTED Final   Proteus species DETECTED (A) NOT DETECTED Final    Comment: CRITICAL RESULT CALLED TO, READ BACK BY AND VERIFIED WITH: E. SINCLAIR PHARMD AT 1153 09/14/22 D. VANHOOK    Salmonella species NOT DETECTED NOT DETECTED Final   Serratia marcescens NOT DETECTED NOT DETECTED Final  Haemophilus influenzae NOT DETECTED NOT DETECTED Final   Neisseria meningitidis NOT  DETECTED NOT DETECTED Final   Pseudomonas aeruginosa NOT DETECTED NOT DETECTED Final   Stenotrophomonas maltophilia NOT DETECTED NOT DETECTED Final   Candida albicans NOT DETECTED NOT DETECTED Final   Candida auris NOT DETECTED NOT DETECTED Final   Candida glabrata NOT DETECTED NOT DETECTED Final   Candida krusei NOT DETECTED NOT DETECTED Final   Candida parapsilosis NOT DETECTED NOT DETECTED Final   Candida tropicalis NOT DETECTED NOT DETECTED Final   Cryptococcus neoformans/gattii NOT DETECTED NOT DETECTED Final   CTX-M ESBL NOT DETECTED NOT DETECTED Final   Carbapenem resistance IMP NOT DETECTED NOT DETECTED Final   Carbapenem resistance KPC NOT DETECTED NOT DETECTED Final   Carbapenem resistance NDM NOT DETECTED NOT DETECTED Final   Carbapenem resist OXA 48 LIKE NOT DETECTED NOT DETECTED Final   Carbapenem resistance VIM NOT DETECTED NOT DETECTED Final    Comment: Performed at [facility], [address] [place], [place] [address]  Blood Culture (routine x 2)     Status: Abnormal   Collection Time: 09/13/22  7:31 PM   Specimen: BLOOD  Result Value Ref Range Status   Specimen Description BLOOD LEFT ANTECUBITAL  Final   Special Requests   Final    BOTTLES DRAWN AEROBIC AND ANAEROBIC Blood Culture results may not be optimal due to an excessive volume of blood received in culture bottles   Culture  Setup Time   Final    GRAM NEGATIVE RODS ANAEROBIC BOTTLE ONLY CRITICAL RESULT CALLED TO, READ BACK BY AND VERIFIED WITH: PHARMD LISA CURRAN ON 09/15/22 @ 1809 BY DRT    Culture (A)  Final    PREVOTELLA BIVIA BETA LACTAMASE POSITIVE Performed at [facility], [address] [place], [place] [address]    Report Status 09/17/2022 FINAL  Final  Surgical pcr screen     Status: None   Collection Time: 09/16/22  4:50 PM   Specimen: Nasal Mucosa; Nasal Swab  Result Value Ref Range Status   MRSA, PCR NEGATIVE NEGATIVE Final   Staphylococcus aureus NEGATIVE NEGATIVE Final     Comment: (NOTE) The Xpert SA Assay (FDA approved for NASAL specimens in patients 35 years of age and older), is one component of a comprehensive surveillance program. It is not intended to diagnose infection nor to guide or monitor treatment. Performed at [facility], [address], [place], [place] [address]   Aerobic/Anaerobic Culture w Gram Stain (surgical/deep wound)     Status: None   Collection Time: 09/17/22  8:48 AM   Specimen: Soft Tissue, Other  Result Value Ref Range Status   Specimen Description TISSUE  Final   Special Requests left leg wound  Final   Gram Stain   Final    ABUNDANT GRAM NEGATIVE RODS ABUNDANT GRAM POSITIVE COCCI FEW GRAM POSITIVE RODS FEW WBC PRESENT, PREDOMINANTLY PMN    Culture   Final    MODERATE PROTEUS MIRABILIS FEW ENTEROCOCCUS FAECALIS ABUNDANT PREVOTELLA BIVIA BETA LACTAMASE POSITIVE Performed at [facility], [address] [place], [place] [address]    Report Status 09/21/2022 FINAL  Final   Organism ID, Bacteria PROTEUS MIRABILIS  Final   Organism ID, Bacteria ENTEROCOCCUS FAECALIS  Final      Susceptibility   Enterococcus faecalis - MIC*    AMPICILLIN <=2 SENSITIVE Sensitive     VANCOMYCIN 1 SENSITIVE Sensitive     GENTAMICIN SYNERGY SENSITIVE Sensitive     *  FEW ENTEROCOCCUS FAECALIS   Proteus mirabilis - MIC*    AMPICILLIN <=2 SENSITIVE Sensitive     CEFEPIME <=0.12 SENSITIVE Sensitive     CEFTAZIDIME <=1 SENSITIVE Sensitive     CEFTRIAXONE <=0.25 SENSITIVE Sensitive     CIPROFLOXACIN <=0.25 SENSITIVE Sensitive     GENTAMICIN <=1 SENSITIVE Sensitive     IMIPENEM 2 SENSITIVE Sensitive     TRIMETH/SULFA <=20 SENSITIVE Sensitive     AMPICILLIN/SULBACTAM <=2 SENSITIVE Sensitive     PIP/TAZO <=4 SENSITIVE Sensitive     * MODERATE PROTEUS MIRABILIS    Labs: CBC: Recent Labs  Lab 09/19/22 0205 09/21/22 0716  WBC 19.8* 14.4*  NEUTROABS 15.3*  --   HGB 8.6* 8.9*  HCT 27.3* 26.6*  MCV 81.7 81.6  PLT 699* 729*    Basic Metabolic Panel: Recent Labs  Lab 09/19/22 0205 09/21/22 0716  NA 131* 132*  K 3.7 3.6  CL 100 98  CO2 24 27  GLUCOSE 224* 213*  BUN 16 13  CREATININE 0.73 0.63  CALCIUM 7.9* 8.1*  MG 2.0  --   PHOS 2.9  --    Liver Function Tests: Recent Labs  Lab 09/19/22 0205 09/21/22 0716  AST 17 17  ALT 14 15  ALKPHOS 89 77  BILITOT 0.5 0.4  PROT 6.2* 6.1*  ALBUMIN <1.5* <1.5*   CBG: Recent Labs  Lab 09/24/22 1943 09/24/22 2356 09/25/22 0545 09/25/22 0737 09/25/22 1154  GLUCAP 116* 161* 188* 238* 183*    Discharge time spent: greater than 30 minutes.  Signed: Meredeth Ide, MD [facility] 09/25/2022

## 2022-09-25 NOTE — Inpatient Diabetes Management (Signed)
Inpatient Diabetes Program Recommendations  AACE/ADA: New Consensus Statement on Inpatient Glycemic Control (2015)  Target Ranges:  Prepandial:   less than 140 mg/dL      Peak postprandial:   less than 180 mg/dL (1-2 hours)      Critically ill patients:  140 - 180 mg/dL   Lab Results  Component Value Date   GLUCAP 238 (H) 09/25/2022   HGBA1C 9.8 (H) 09/16/2022    Review of Glycemic Control  Latest Reference Range & Units 09/24/22 05:50 09/24/22 11:42 09/24/22 16:06 09/24/22 19:43 09/24/22 23:56 09/25/22 05:45 09/25/22 07:37  Glucose-Capillary 70 - 99 mg/dL 161 (H) 096 (H) 045 (H) 116 (H) 161 (H) 188 (H) 238 (H)   Diabetes history: DM2 Outpatient Diabetes medications: Novolin 70/30 3 units BID, Mounjaro 2.5 mg weekly Current orders for Inpatient glycemic control:  Novolog 0-9 units tid 70/30 5 units bid  Glucerna bid between meals  Inpatient Diabetes Program Recommendations:    Please consider: -   increasing 70/30 insulin 7 units bid ac meals   Discharge needs: Dexcom [id number]   Thanks,  Christena Deem RN, MSN, BC-ADM Inpatient Diabetes Coordinator Team Pager [phone] (8a-5p)

## 2022-09-25 NOTE — Progress Notes (Signed)
Physical Therapy Treatment Patient Details Name: Grant Yang MRN: [id number] DOB: [birth year] Today's Date: 09/25/2022   History of Present Illness Admitted with L LE infection necesitating transtibial amputation;  has a past medical history of Cancer (HCC), Diabetes mellitus, GERD (gastroesophageal reflux disease), History of kidney stones, Hypertension, Neuropathy in diabetes (HCC), Shortness of breath, and Stroke (HCC) (2010).    PT Comments  Pt tolerated treatment well today. Pt was able to ambulate around the room with RW at Mod I level. Discussed with MD and CM that from a mobility standpoint pt is safe to return home with HHPT which pt states that he prefers. No change in DC/DME recs at this time.  PT will continue to follow.   If plan is discharge home, recommend the following: A little help with walking and/or transfers;A little help with bathing/dressing/bathroom;Assistance with cooking/housework;Assist for transportation   Can travel by private vehicle        Equipment Recommendations  BSC/3in1;Wheelchair (measurements PT);Wheelchair cushion (measurements PT)    Recommendations for Other Services       Precautions / Restrictions Precautions Precautions: Fall;Other (comment) Precaution Comments: Wound vac     Mobility  Bed Mobility               General bed mobility comments: Pt seated EOB    Transfers Overall transfer level: Needs assistance Equipment used: Rolling walker (2 wheels) Transfers: Sit to/from Stand Sit to Stand: Modified independent (Device/Increase time)           General transfer comment: Pt with good hand placement    Ambulation/Gait Ambulation/Gait assistance: Modified independent (Device/Increase time) Gait Distance (Feet): 25 Feet Assistive device: Rolling walker (2 wheels) Gait Pattern/deviations: Step-to pattern, Trunk flexed Gait velocity: decreased     General Gait Details: no LOB noted. Pt able to ambulate around room  with RW.   Stairs             Wheelchair Mobility     Tilt Bed    Modified Rankin (Stroke Patients Only)       Balance Overall balance assessment: Mild deficits observed, not formally tested                                          Cognition Arousal/Alertness: Awake/alert Behavior During Therapy: WFL for tasks assessed/performed Overall Cognitive Status: Within Functional Limits for tasks assessed                                          Exercises      General Comments General comments (skin integrity, edema, etc.): VSS      Pertinent Vitals/Pain Pain Assessment Pain Assessment: No/denies pain    Home Living                          Prior Function            PT Goals (current goals can now be found in the care plan section) Progress towards PT goals: Progressing toward goals    Frequency    Min 1X/week      PT Plan Current plan remains appropriate    Co-evaluation              AM-PAC PT "6 Clicks" Mobility  Outcome Measure  Help needed turning from your back to your side while in a flat bed without using bedrails?: None Help needed moving from lying on your back to sitting on the side of a flat bed without using bedrails?: None Help needed moving to and from a bed to a chair (including a wheelchair)?: None Help needed standing up from a chair using your arms (e.g., wheelchair or bedside chair)?: None Help needed to walk in hospital room?: None Help needed climbing 3-5 steps with a railing? : A Little 6 Click Score: 23    End of Session Equipment Utilized During Treatment: Gait belt Activity Tolerance: Patient tolerated treatment well Patient left: in chair;with call bell/phone within reach Nurse Communication: Mobility status PT Visit Diagnosis: Unsteadiness on feet (R26.81);Other abnormalities of gait and mobility (R26.89)     Time: 4098-1191 PT Time Calculation (min) (ACUTE  ONLY): 22 min  Charges:    $Gait Training: 8-22 mins PT General Charges $$ ACUTE PT VISIT: 1 Visit                     Shela Nevin, PT, DPT Acute Rehab Services [phone]    Grant Yang 09/25/2022, 11:55 AM

## 2022-09-26 ENCOUNTER — Ambulatory Visit: Payer: Medicare Other | Admitting: Internal Medicine

## 2022-09-27 ENCOUNTER — Telehealth: Payer: Self-pay | Admitting: Internal Medicine

## 2022-09-27 ENCOUNTER — Telehealth: Payer: Self-pay | Admitting: Orthopedic Surgery

## 2022-09-27 ENCOUNTER — Telehealth: Payer: Self-pay

## 2022-09-27 DIAGNOSIS — E43 Unspecified severe protein-calorie malnutrition: Secondary | ICD-10-CM | POA: Diagnosis not present

## 2022-09-27 DIAGNOSIS — I10 Essential (primary) hypertension: Secondary | ICD-10-CM | POA: Diagnosis not present

## 2022-09-27 DIAGNOSIS — E1142 Type 2 diabetes mellitus with diabetic polyneuropathy: Secondary | ICD-10-CM | POA: Diagnosis not present

## 2022-09-27 DIAGNOSIS — Z993 Dependence on wheelchair: Secondary | ICD-10-CM | POA: Diagnosis not present

## 2022-09-27 DIAGNOSIS — Z7985 Long-term (current) use of injectable non-insulin antidiabetic drugs: Secondary | ICD-10-CM | POA: Diagnosis not present

## 2022-09-27 DIAGNOSIS — Z7901 Long term (current) use of anticoagulants: Secondary | ICD-10-CM | POA: Diagnosis not present

## 2022-09-27 DIAGNOSIS — Z794 Long term (current) use of insulin: Secondary | ICD-10-CM | POA: Diagnosis not present

## 2022-09-27 DIAGNOSIS — Z89512 Acquired absence of left leg below knee: Secondary | ICD-10-CM | POA: Diagnosis not present

## 2022-09-27 DIAGNOSIS — Z87891 Personal history of nicotine dependence: Secondary | ICD-10-CM | POA: Diagnosis not present

## 2022-09-27 DIAGNOSIS — Z4781 Encounter for orthopedic aftercare following surgical amputation: Secondary | ICD-10-CM | POA: Diagnosis not present

## 2022-09-27 DIAGNOSIS — Z8673 Personal history of transient ischemic attack (TIA), and cerebral infarction without residual deficits: Secondary | ICD-10-CM | POA: Diagnosis not present

## 2022-09-27 DIAGNOSIS — K219 Gastro-esophageal reflux disease without esophagitis: Secondary | ICD-10-CM | POA: Diagnosis not present

## 2022-09-27 DIAGNOSIS — D649 Anemia, unspecified: Secondary | ICD-10-CM | POA: Diagnosis not present

## 2022-09-27 DIAGNOSIS — E1151 Type 2 diabetes mellitus with diabetic peripheral angiopathy without gangrene: Secondary | ICD-10-CM | POA: Diagnosis not present

## 2022-09-27 DIAGNOSIS — E871 Hypo-osmolality and hyponatremia: Secondary | ICD-10-CM | POA: Diagnosis not present

## 2022-09-27 DIAGNOSIS — Z7982 Long term (current) use of aspirin: Secondary | ICD-10-CM | POA: Diagnosis not present

## 2022-09-27 NOTE — Telephone Encounter (Signed)
[facility] calling said pt blood sugar was 333 today, says pt is having questions regarding how much insulin to take. Pt also says he is missing his Plavix, Atorvastatin, and Olmesartan. Recommends calling pt son Ladona Ridgel [phone]. Please advise Thank You

## 2022-09-27 NOTE — Transitions of Care (Post Inpatient/ED Visit) (Signed)
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 How have you been since you were released from the hospital?: Better (Patient notes he is doing very well) Any questions or concerns?: No  Items Reviewed: Did you receive and understand the discharge instructions provided?: Yes Medications obtained,verified, and reconciled?: Yes (Medications Reviewed) Any new allergies since your discharge?: No Dietary orders reviewed?: Yes Type of Diet Ordered:: Low sodium, heart healthy Do you have support at home?: Yes People in Home: child(ren), adult Name of Support/Comfort Primary Source: Ladona Ridgel  Medications Reviewed Today: Medications Reviewed Today     Reviewed by Jodelle Gross, RN (Case Manager) on 09/27/22 at [phone]  Med List Status: <None>   Medication Order Taking? Sig Documenting Provider Last Dose Status Informant  albuterol (VENTOLIN HFA) 108 (90 Base) MCG/ACT inhaler [id number] No Inhale 2 puffs into the lungs every 6 (six) hours as needed for wheezing or shortness of breath.  Patient not taking: Reported on 09/13/2022   Gardenia Phlegm, MD Not Taking Active Self           Med Note Joella Prince A   Thu Jul 20, 2022  1:45 PM)    amoxicillin-clavulanate (AUGMENTIN) 875-125 MG tablet [id number] Yes Take 1 tablet by mouth every 12 (twelve) hours for 19 days. Meredeth Ide, MD Taking Active   ascorbic acid (VITAMIN C) 1000 MG tablet [id number] Yes Take 1 tablet (1,000 mg total) by mouth daily. Meredeth Ide, MD Taking Active   aspirin EC 81 MG tablet [id number] Yes Take 1 tablet (81 mg total) by mouth daily. Swallow whole. Billie Lade, MD Taking Active Self  atorvastatin (LIPITOR) 40 MG tablet [id number]  Take 1 tablet (40 mg total) by mouth daily.  Patient not taking: Reported on 09/13/2022   Billie Lade, MD  Active Self           Med Note (COFFELL, Marzella Schlein   Mon Jul 31, 2022  3:58 PM) Pt states he is not taking, unsure if pt is confusing atorvastatin with amlodipine.  blood glucose meter kit and supplies [id number]  Dispense based on patient and insurance preference. Use up to four times daily as directed. (FOR ICD-10 E10.9, E11.9). Langston Reusing, MD  Active Self  clopidogrel (PLAVIX) 75 MG tablet [id number] Yes Take 1 tablet (75 mg total) by mouth daily with breakfast. Burnadette Pop, MD Taking Active Self  Continuous Blood Gluc Sensor (DEXCOM G7 SENSOR) MISC [id number] Yes 1 Units by Does not apply route See admin instructions. Change sensor after 10 days Gardenia Phlegm, MD Taking Active Self  EPINEPHrine 0.3 mg/0.3 mL IJ SOAJ injection [id number]  Inject 0.3 mg into the muscle once as needed for up to 1 dose (if patient exhibits significant signs and symptoms of allergic reaction.). Lanae Boast, MD  Active Self  gabapentin (NEURONTIN) 300 MG capsule [id number] Yes Take 1 capsule (300 mg total) by mouth 3 (three) times daily as needed.  Patient taking differently: Take 300 mg by mouth 3 (three) times daily as needed (Neuropathy).   Gardenia Phlegm, MD Taking Active Self  insulin NPH-regular Human (NOVOLIN 70/30) (70-30) 100 UNIT/ML injection [id number] Yes Inject 10 Units  into the skin 2 (two) times daily with a meal.  Patient taking differently: Inject 3 Units into the skin 3 (three) times daily with meals.   Langston Reusing, MD Taking Active Self           Med Note Ansel Bong Sep 13, 2022  7:57 PM)    Insulin Syringes, Disposable, U-100 0.3 ML MISC [id number] Yes 10 Units by Does not apply route 2 (two) times daily with breakfast and lunch. Langston Reusing, MD Taking Active Self   oxyCODONE (ROXICODONE) 5 MG immediate release tablet [id number] Yes Take 1 tablet (5 mg total) by mouth every 6 (six) hours as needed. Meredeth Ide, MD Taking Active   pantoprazole (PROTONIX) 40 MG tablet [id number] Yes Take 1 tablet (40 mg total) by mouth daily. Meredeth Ide, MD Taking Active   tirzepatide [facility]) 2.5 MG/0.5ML Pen [id number] Yes Inject 2.5 mg into the skin once a week. Billie Lade, MD Taking Active Self  zinc sulfate 220 (50 Zn) MG capsule [id number] Yes Take 1 capsule (220 mg total) by mouth daily. Meredeth Ide, MD Taking Active             Home Care and Equipment/Supplies: Were Home Health Services Ordered?: Yes Name of Home Health Agency:: [facility] Has Agency set up a time to come to your home?: Yes First Home Health Visit Date: 09/27/22 Any new equipment or medical supplies ordered?: No  Functional Questionnaire: Do you need assistance with bathing/showering or dressing?: Yes Do you need assistance with meal preparation?: Yes Do you need assistance with eating?: No Do you have difficulty maintaining continence: No Do you need assistance with getting out of bed/getting out of a chair/moving?: No Do you have difficulty managing or taking your medications?: No  Follow up appointments reviewed: PCP Follow-up appointment confirmed?: NA Specialist Hospital Follow-up appointment confirmed?: Yes Date of Specialist follow-up appointment?: 10/02/22 Follow-Up Specialty Provider:: Dr. Lajoyce Corners Do you need transportation to your follow-up appointment?: No Do you understand care options if your condition(s) worsen?: Yes-patient verbalized understanding  SDOH Interventions Today    Flowsheet Row Most Recent Value  SDOH Interventions   Food Insecurity Interventions Intervention Not Indicated  Housing Interventions Intervention Not Indicated  Transportation Interventions Intervention Not Indicated      Interventions Today    Flowsheet Row Most Recent Value   Chronic Disease   Chronic disease during today's visit Diabetes  [Lt. BKA]  General Interventions   General Interventions Discussed/Reviewed General Interventions Discussed, Referral to Nurse       [facility] Interventions Today    Flowsheet Row Most Recent Value  TOC Interventions   TOC Interventions Discussed/Reviewed TOC Interventions Discussed, TOC Interventions Reviewed      Jodelle Gross, RN, BSN, CCM Care Management Coordinator [facility] Phone: [phone]/Fax: [phone]

## 2022-09-27 NOTE — Telephone Encounter (Signed)
Grant Yang from [facility] needs to be called back for his wound care amputation and what they need to do . CB#[phone]

## 2022-09-27 NOTE — Telephone Encounter (Signed)
I called [facility] to advise that he had his wound vac removed on day of discharge. Pt to do dry dressing and ace ( pt lost shrinker)  daily and he has a follow up appt 10/04/2022 also verbal ok for PT eval and treat to call with any questions.

## 2022-09-28 DIAGNOSIS — E43 Unspecified severe protein-calorie malnutrition: Secondary | ICD-10-CM | POA: Diagnosis not present

## 2022-09-28 DIAGNOSIS — E1151 Type 2 diabetes mellitus with diabetic peripheral angiopathy without gangrene: Secondary | ICD-10-CM | POA: Diagnosis not present

## 2022-09-28 DIAGNOSIS — E1142 Type 2 diabetes mellitus with diabetic polyneuropathy: Secondary | ICD-10-CM | POA: Diagnosis not present

## 2022-09-28 DIAGNOSIS — Z7985 Long-term (current) use of injectable non-insulin antidiabetic drugs: Secondary | ICD-10-CM | POA: Diagnosis not present

## 2022-09-28 DIAGNOSIS — Z87891 Personal history of nicotine dependence: Secondary | ICD-10-CM | POA: Diagnosis not present

## 2022-09-28 DIAGNOSIS — E871 Hypo-osmolality and hyponatremia: Secondary | ICD-10-CM | POA: Diagnosis not present

## 2022-09-28 DIAGNOSIS — Z993 Dependence on wheelchair: Secondary | ICD-10-CM | POA: Diagnosis not present

## 2022-09-28 DIAGNOSIS — I10 Essential (primary) hypertension: Secondary | ICD-10-CM | POA: Diagnosis not present

## 2022-09-28 DIAGNOSIS — Z4781 Encounter for orthopedic aftercare following surgical amputation: Secondary | ICD-10-CM | POA: Diagnosis not present

## 2022-09-28 DIAGNOSIS — D649 Anemia, unspecified: Secondary | ICD-10-CM | POA: Diagnosis not present

## 2022-09-28 DIAGNOSIS — Z794 Long term (current) use of insulin: Secondary | ICD-10-CM | POA: Diagnosis not present

## 2022-09-28 DIAGNOSIS — K219 Gastro-esophageal reflux disease without esophagitis: Secondary | ICD-10-CM | POA: Diagnosis not present

## 2022-09-28 DIAGNOSIS — Z8673 Personal history of transient ischemic attack (TIA), and cerebral infarction without residual deficits: Secondary | ICD-10-CM | POA: Diagnosis not present

## 2022-09-28 DIAGNOSIS — Z89512 Acquired absence of left leg below knee: Secondary | ICD-10-CM | POA: Diagnosis not present

## 2022-09-28 DIAGNOSIS — Z7901 Long term (current) use of anticoagulants: Secondary | ICD-10-CM | POA: Diagnosis not present

## 2022-09-28 DIAGNOSIS — Z7982 Long term (current) use of aspirin: Secondary | ICD-10-CM | POA: Diagnosis not present

## 2022-09-29 ENCOUNTER — Telehealth: Payer: Self-pay | Admitting: Internal Medicine

## 2022-09-29 ENCOUNTER — Other Ambulatory Visit: Payer: Self-pay

## 2022-09-29 DIAGNOSIS — Z7982 Long term (current) use of aspirin: Secondary | ICD-10-CM | POA: Diagnosis not present

## 2022-09-29 DIAGNOSIS — Z7901 Long term (current) use of anticoagulants: Secondary | ICD-10-CM | POA: Diagnosis not present

## 2022-09-29 DIAGNOSIS — E1151 Type 2 diabetes mellitus with diabetic peripheral angiopathy without gangrene: Secondary | ICD-10-CM | POA: Diagnosis not present

## 2022-09-29 DIAGNOSIS — D649 Anemia, unspecified: Secondary | ICD-10-CM | POA: Diagnosis not present

## 2022-09-29 DIAGNOSIS — E1142 Type 2 diabetes mellitus with diabetic polyneuropathy: Secondary | ICD-10-CM | POA: Diagnosis not present

## 2022-09-29 DIAGNOSIS — Z993 Dependence on wheelchair: Secondary | ICD-10-CM | POA: Diagnosis not present

## 2022-09-29 DIAGNOSIS — Z8673 Personal history of transient ischemic attack (TIA), and cerebral infarction without residual deficits: Secondary | ICD-10-CM

## 2022-09-29 DIAGNOSIS — E43 Unspecified severe protein-calorie malnutrition: Secondary | ICD-10-CM | POA: Diagnosis not present

## 2022-09-29 DIAGNOSIS — Z794 Long term (current) use of insulin: Secondary | ICD-10-CM | POA: Diagnosis not present

## 2022-09-29 DIAGNOSIS — Z87891 Personal history of nicotine dependence: Secondary | ICD-10-CM | POA: Diagnosis not present

## 2022-09-29 DIAGNOSIS — Z4781 Encounter for orthopedic aftercare following surgical amputation: Secondary | ICD-10-CM | POA: Diagnosis not present

## 2022-09-29 DIAGNOSIS — L989 Disorder of the skin and subcutaneous tissue, unspecified: Secondary | ICD-10-CM

## 2022-09-29 DIAGNOSIS — I10 Essential (primary) hypertension: Secondary | ICD-10-CM

## 2022-09-29 DIAGNOSIS — E1165 Type 2 diabetes mellitus with hyperglycemia: Secondary | ICD-10-CM

## 2022-09-29 DIAGNOSIS — Z7985 Long-term (current) use of injectable non-insulin antidiabetic drugs: Secondary | ICD-10-CM | POA: Diagnosis not present

## 2022-09-29 DIAGNOSIS — K219 Gastro-esophageal reflux disease without esophagitis: Secondary | ICD-10-CM | POA: Diagnosis not present

## 2022-09-29 DIAGNOSIS — E871 Hypo-osmolality and hyponatremia: Secondary | ICD-10-CM | POA: Diagnosis not present

## 2022-09-29 DIAGNOSIS — Z89512 Acquired absence of left leg below knee: Secondary | ICD-10-CM | POA: Diagnosis not present

## 2022-09-29 MED ORDER — OLMESARTAN MEDOXOMIL 20 MG PO TABS
20.0000 mg | ORAL_TABLET | Freq: Every day | ORAL | 0 refills | Status: DC
Start: 2022-09-29 — End: 2022-10-25

## 2022-09-29 MED ORDER — ATORVASTATIN CALCIUM 40 MG PO TABS: 40.0000 mg | ORAL_TABLET | Freq: Every day | ORAL | 3 refills | Status: DC

## 2022-09-29 MED ORDER — CLOPIDOGREL BISULFATE 75 MG PO TABS: 75.0000 mg | ORAL_TABLET | Freq: Every day | ORAL | 3 refills | Status: DC

## 2022-09-29 NOTE — Telephone Encounter (Signed)
[place] Occupational therapist with [facility]   Verbal orders for OT  1 week 2   Call back [phone].

## 2022-09-29 NOTE — Telephone Encounter (Signed)
Refills sent

## 2022-10-02 NOTE — Telephone Encounter (Signed)
Spoke to [employer], provided verbal orders

## 2022-10-04 ENCOUNTER — Other Ambulatory Visit: Payer: Self-pay

## 2022-10-04 ENCOUNTER — Ambulatory Visit: Payer: Medicare Other | Admitting: Infectious Diseases

## 2022-10-04 ENCOUNTER — Encounter: Payer: Self-pay | Admitting: Infectious Diseases

## 2022-10-04 ENCOUNTER — Ambulatory Visit: Payer: Medicare Other | Admitting: Family

## 2022-10-04 VITALS — BP 118/70 | HR 77 | Resp 16 | Ht 70.6 in | Wt 186.9 lb

## 2022-10-04 DIAGNOSIS — M86172 Other acute osteomyelitis, left ankle and foot: Secondary | ICD-10-CM | POA: Diagnosis not present

## 2022-10-04 DIAGNOSIS — Z89512 Acquired absence of left leg below knee: Secondary | ICD-10-CM

## 2022-10-04 DIAGNOSIS — Z79899 Other long term (current) drug therapy: Secondary | ICD-10-CM

## 2022-10-04 NOTE — Progress Notes (Addendum)
Post-Op Visit Note   Patient: Grant Yang           Date of Birth: 25-Sep-1955           MRN: [id number] Visit Date: 10/04/2022 PCP: Billie Lade, MD  Chief Complaint:  Chief Complaint  Patient presents with   Left Leg - Routine Post Op    09/17/2022 left BKA     HPI:  HPI The patient is a 67 year old gentleman who is seen status post left below-knee amputation July 28  Patient is a new left transtibial  amputee.  Patient's current comorbidities are not expected to impact the ability to function with the prescribed prosthesis. Patient verbally communicates a strong desire to use a prosthesis. Patient currently requires mobility aids to ambulate without a prosthesis.  Expects not to use mobility aids with a new prosthesis.  Patient is a K3 level ambulator that spends a lot of time walking around on uneven terrain over obstacles, up and down stairs, and ambulates with a variable cadence.   Ortho Exam On examination of the left residual limb this is well consolidated staples in place the incision is healing well it is dry and clean  Visit Diagnoses: No diagnosis found.  Plan: Continue daily Dial soap cleansing.  Dry dressings.  Continue shrinker and limb protector.  Follow-up in 2 weeks for staple removal.  Follow-Up Instructions: No follow-ups on file.   Imaging: No results found.  Orders:  No orders of the defined types were placed in this encounter.  No orders of the defined types were placed in this encounter.    PMFS History: Patient Active Problem List   Diagnosis Date Noted   Severe protein-calorie malnutrition (HCC) 09/14/2022   Osteomyelitis of left lower extremity (HCC) 09/13/2022   Severe sepsis (HCC) 09/13/2022   Penetrating foot wound, left, initial encounter 09/13/2022   AKI (acute kidney injury) (HCC) 09/13/2022   High anion gap metabolic acidosis 09/13/2022   Dehydration 09/13/2022   Acute prerenal azotemia 09/13/2022   DM2 (diabetes  mellitus, type 2) (HCC) 09/13/2022   Osteomyelitis of foot, left, acute (HCC) 09/04/2022   PAD (peripheral artery disease) (HCC) 08/16/2022   Hospital discharge follow-up 08/16/2022   Foot ulcer, left (HCC) 07/30/2022   History of CVA (cerebrovascular accident) 07/05/2022   Medication management 06/27/2022   Acute osteomyelitis of left foot (HCC) 05/30/2022   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) 05/30/2022   Cellulitis of left leg 05/29/2022   COPD suggested by initial evaluation (HCC) 05/29/2022   Prostate cancer (HCC) 01/30/2017   Cellulitis of left hand 02/01/17 I and D 01/30/2017   Essential hypertension 01/30/2017   Uncontrolled type 2 diabetes mellitus with hyperglycemia (HCC) 01/30/2017   Acute hyponatremia 12/29/2010   Past Medical History:  Diagnosis Date   Cancer (HCC)    prostate cancer    Diabetes mellitus    GERD (gastroesophageal reflux disease)    History of kidney stones    Hypertension    Neuropathy in diabetes (HCC)    bilateral lower extremities    Shortness of breath    patient states it hurts to breath; this was when he use to smoke cigarettes    Stroke [facility]) 2010   "mini stroke" patient cant remember when; states it happened in w[place] in 2010 . denies any residual deficits     Family History  Problem Relation Age of Onset   Heart failure Mother  Cancer Father     Past Surgical History:  Procedure Laterality Date   ABDOMINAL AORTOGRAM W/LOWER EXTREMITY N/A 07/31/2022   Procedure: ABDOMINAL AORTOGRAM W/LOWER EXTREMITY;  Surgeon: Leonie Douglas, MD;  Location: [facility] INVASIVE CV LAB;  Service: Cardiovascular;  Laterality: N/A;   AMPUTATION Left 05/31/2022   Procedure: LEFT 2ND TOE AMPUTATION;  Surgeon: Nadara Mustard, MD;  Location: [facility] OR;  Service: Orthopedics;  Laterality: Left;   AMPUTATION Left 09/17/2022   Procedure: AMPUTATION BELOW KNEE;  Surgeon: Nadara Mustard, MD;  Location: [facility] OR;  Service: Orthopedics;   Laterality: Left;   APPENDECTOMY     APPLICATION OF WOUND VAC Left 09/17/2022   Procedure: APPLICATION OF WOUND VAC;  Surgeon: Nadara Mustard, MD;  Location: [facility] OR;  Service: Orthopedics;  Laterality: Left;   CHOLECYSTECTOMY  12/30/2010   Procedure: LAPAROSCOPIC CHOLECYSTECTOMY;  Surgeon: Dalia Heading;  Location: [facility] ORS;  Service: General;  Laterality: N/A;   I & D EXTREMITY Left 05/31/2022   Procedure: LEFT FOOT DEBRIDEMENT;  Surgeon: Nadara Mustard, MD;  Location: [facility] OR;  Service: Orthopedics;  Laterality: Left;   INCISION AND DRAINAGE ABSCESS Left 02/01/2017   Procedure: INCISION AND DRAINAGE ABSCESS LEFT HAND;  Surgeon: Vickki Hearing, MD;  Location: [facility] ORS;  Service: Orthopedics;  Laterality: Left;   LOWER EXTREMITY ANGIOGRAPHY N/A 08/03/2022   Procedure: Lower Extremity Angiography;  Surgeon: Cephus Shelling, MD;  Location: [facility] INVASIVE CV LAB;  Service: Cardiovascular;  Laterality: N/A;   LYMPHADENECTOMY Bilateral 08/02/2017   Procedure: LYMPHADENECTOMY, PELVIC;  Surgeon: Heloise Purpura, MD;  Location: [facility] ORS;  Service: Urology;  Laterality: Bilateral;   PERIPHERAL VASCULAR BALLOON ANGIOPLASTY  08/03/2022   Procedure: PERIPHERAL VASCULAR BALLOON ANGIOPLASTY;  Surgeon: Cephus Shelling, MD;  Location: [facility] INVASIVE CV LAB;  Service: Cardiovascular;;   ROBOT ASSISTED LAPAROSCOPIC RADICAL PROSTATECTOMY N/A 08/02/2017   Procedure: XI ROBOTIC ASSISTED LAPAROSCOPIC RADICAL PROSTATECTOMY LEVEL 2;  Surgeon: Heloise Purpura, MD;  Location: [facility] ORS;  Service: Urology;  Laterality: N/A;   Social History   Occupational History   Not on file  Tobacco Use   Smoking status: Former    Current packs/day: 0.00    Average packs/day: 2.0 packs/day for 38.0 years (76.0 ttl pk-yrs)    Types: Cigarettes    Start date: 64    Quit date: 2010    Years since quitting: 14.6   Smokeless tobacco: Never  Substance and Sexual Activity   Alcohol use: No   Drug use: Yes    Types: Marijuana    Comment:  occ   Sexual activity: Yes

## 2022-10-04 NOTE — Progress Notes (Addendum)
Patient Active Problem List   Diagnosis Date Noted   Severe protein-calorie malnutrition (HCC) 09/14/2022   Osteomyelitis of left lower extremity (HCC) 09/13/2022   Severe sepsis (HCC) 09/13/2022   Penetrating foot wound, left, initial encounter 09/13/2022   AKI (acute kidney injury) (HCC) 09/13/2022   High anion gap metabolic acidosis 09/13/2022   Dehydration 09/13/2022   Acute prerenal azotemia 09/13/2022   DM2 (diabetes mellitus, type 2) (HCC) 09/13/2022   Osteomyelitis of foot, left, acute (HCC) 09/04/2022   PAD (peripheral artery disease) (HCC) 08/16/2022   Hospital discharge follow-up 08/16/2022   Foot ulcer, left (HCC) 07/30/2022   History of CVA (cerebrovascular accident) 07/05/2022   Medication management 06/27/2022   Acute osteomyelitis of left foot (HCC) 05/30/2022   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 38 tablet 0   ascorbic acid (VITAMIN C) 1000 MG tablet Take 1 tablet (1,000 mg total) by mouth daily. 30 tablet 0   aspirin EC 81 MG tablet Take 1 tablet (81 mg total) by mouth daily. Swallow whole. 30 tablet 12   atorvastatin (LIPITOR) 40 MG tablet Take 1 tablet (40 mg total) by mouth daily. 90 tablet 3   blood glucose meter kit and supplies Dispense based on patient and insurance preference. Use up to four times daily as directed. (FOR ICD-10 E10.9, E11.9). 1 each 0   clopidogrel (PLAVIX) 75 MG  tablet Take 1 tablet (75 mg total) by mouth daily with breakfast. 30 tablet 3   Continuous Blood Gluc Sensor (DEXCOM G7 SENSOR) MISC 1 Units by Does not apply route See admin instructions. Change sensor after 10 days 3 each 2   EPINEPHrine 0.3 mg/0.3 mL IJ SOAJ injection Inject 0.3 mg into the muscle once as needed for up to 1 dose (if patient exhibits significant signs and symptoms of allergic reaction.). 1 each 0   gabapentin (NEURONTIN) 300 MG capsule Take 1 capsule (300 mg total) by mouth 3 (three) times daily as needed. (Patient taking differently: Take 300 mg by mouth 3 (three) times daily as needed (Neuropathy).) 180 capsule 1   insulin NPH-regular Human (NOVOLIN 70/30) (70-30) 100 UNIT/ML injection Inject 10 Units into the skin 2 (two) times daily with a meal. (Patient taking differently: Inject 3 Units into the skin 3 (three) times daily with meals.) 10 mL 1   Insulin Syringes, Disposable, U-100 0.3 ML MISC 10 Units by Does not apply route 2 (two) times daily with breakfast and lunch. 60 each 0   olmesartan (BENICAR) 20 MG tablet Take 1 tablet (20 mg total) by mouth daily. 30 tablet 0   oxyCODONE (ROXICODONE) 5 MG immediate release tablet Take 1 tablet (5 mg total) by mouth every 6 (six) hours as needed. 20 tablet 0   pantoprazole (PROTONIX) 40 MG tablet Take 1 tablet (40 mg total) by mouth daily. 30 tablet 0   zinc sulfate 220 (50 Zn) MG capsule Take 1 capsule (220  mg total) by mouth daily. 30 capsule 0   albuterol (VENTOLIN HFA) 108 (90 Base) MCG/ACT inhaler Inhale 2 puffs into the lungs every 6 (six) hours as needed for wheezing or shortness of breath. (Patient not taking: Reported on 10/04/2022) 8 g 0   No current facility-administered medications on file prior to visit.   Subjective: 67 YO male with prior h/o prostate cancer s/p prostatectomy/lymphadenectomy, GERD, kidney stones, CVA, uncontrolled DM2 w peripheral neuropathy, PVD s/p vascular intervention in 08/03/2022, HTN s/p left 2nd  toe amputation in 05/31/22 s/p prolonged PO abtx here for HFU after BKA for left DFU/abscess and osteomyelitis. 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  Patient had purulence that extended up to the amputation site.  The muscle was healthy and viable no necrotic tissue or fascia. There is no infection proximal to the amputation site Tissue and purulence from the amputation site sent for cultures. Cx Proteus mirabilis. Per Dr Lajoyce Corners, no concerns for remaining osteomyelitis.Course also complicated with 2/2 bacteremia  Prevotella bivia/Proteus mirbailis. Patient was discharged on 8/5 with prolonged PO augmentin, EOT 10/14/22  10/04/22 Taking PO augmentin. Left BKA wound healing well. He has a fu with Ortho later this afternoon. He had some drainage initially after the surgery which has resolved. Denies any significant pain, warmth and tenderness in the left BKA . He has no complaints today.   Review of Systems: all systems reviewed with pertinent positives and negatives as listed above  Past Medical History:  Diagnosis Date   Cancer (HCC)    prostate cancer    Diabetes mellitus    GERD (gastroesophageal reflux disease)    History of kidney stones    Hypertension    Neuropathy in diabetes [facility])    bilateral lower extremities    Shortness of breath    patient states it hurts to breath; this was when he use to smoke cigarettes    Stroke [facility]) 2010   "mini stroke" patient cant remember when; states it happened in w. [place] in 2010 . denies any residual deficits    Past Surgical History:  Procedure Laterality Date   ABDOMINAL AORTOGRAM W/LOWER EXTREMITY N/A 07/31/2022   Procedure: ABDOMINAL AORTOGRAM W/LOWER EXTREMITY;  Surgeon: Leonie Douglas, MD;  [facility] INVASIVE CV LAB;  Service: Cardiovascular;  Laterality: N/A;   AMPUTATION Left 05/31/2022   Procedure: LEFT 2ND TOE AMPUTATION;  Surgeon: Nadara Mustard, MD;  Location: [facility] OR;  Service: Orthopedics;  Laterality: Left;   AMPUTATION Left 09/17/2022   Procedure:  AMPUTATION BELOW KNEE;  Surgeon: Nadara Mustard, MD;  Location: [facility] OR;  Service: Orthopedics;  Laterality: Left;   APPENDECTOMY     APPLICATION OF WOUND VAC Left 09/17/2022   Procedure: APPLICATION OF WOUND VAC;  Surgeon: Nadara Mustard, MD;  Location: [facility] OR;  Service: Orthopedics;  Laterality: Left;   CHOLECYSTECTOMY  12/30/2010   Procedure: LAPAROSCOPIC CHOLECYSTECTOMY;  Surgeon: Dalia Heading;  Location: [facility] ORS;  Service: General;  Laterality: N/A;   I & D EXTREMITY Left 05/31/2022   Procedure: LEFT FOOT DEBRIDEMENT;  Surgeon: Nadara Mustard, MD;  Location: [facility] OR;  Service: Orthopedics;  Laterality: Left;   INCISION AND DRAINAGE ABSCESS Left 02/01/2017   Procedure: INCISION AND DRAINAGE ABSCESS LEFT HAND;  Surgeon: Vickki Hearing, MD;  Location: [facility] ORS;  Service: Orthopedics;  Laterality: Left;   LOWER EXTREMITY ANGIOGRAPHY N/A 08/03/2022   Procedure: Lower Extremity Angiography;  Surgeon: Cephus Shelling, MD;  Location: [facility] INVASIVE CV LAB;  Service: Cardiovascular;  Laterality: N/A;   LYMPHADENECTOMY Bilateral 08/02/2017   Procedure: LYMPHADENECTOMY, PELVIC;  Surgeon: Heloise Purpura, MD;  Location: [facility] ORS;  Service: Urology;  Laterality: Bilateral;   PERIPHERAL VASCULAR BALLOON ANGIOPLASTY  08/03/2022   Procedure: PERIPHERAL VASCULAR BALLOON ANGIOPLASTY;  Surgeon: Cephus Shelling, MD;  [facility]: [facility];  Service: Cardiovascular;;   ROBOT ASSISTED LAPAROSCOPIC RADICAL PROSTATECTOMY N/A 08/02/2017   Procedure: XI ROBOTIC ASSISTED LAPAROSCOPIC RADICAL PROSTATECTOMY LEVEL 2;  Surgeon: Heloise Purpura, MD;  Location: [facility] ORS;  Service: Urology;  Laterality: N/A;     Social History   Tobacco Use   Smoking status: Former    Current packs/day: 0.00    Average packs/day: 2.0 packs/day for 38.0 years (76.0 ttl pk-yrs)    Types: Cigarettes    Start date: 67    Quit date: 2010    Years since quitting: 14.6   Smokeless tobacco: Never  Substance Use Topics   Alcohol use:  No   Drug use: Yes    Types: Marijuana    Comment: occ    Family History  Problem Relation Age of Onset   Heart failure Mother    Cancer Father     No Known Allergies   Health Maintenance  Topic Date Due   Medicare Annual Wellness (AWV)  Never done   COVID-19 Vaccine (1) Never done   FOOT EXAM  Never done   OPHTHALMOLOGY EXAM  Never done   Diabetic kidney evaluation - Urine ACR  Never done   Hepatitis C Screening  Never done   Colonoscopy  Never done   Lung Cancer Screening  Never done   Pneumonia Vaccine 26+ Years old (2 of 2 - PCV) 08/28/2022   INFLUENZA VACCINE  09/21/2022   Zoster Vaccines- Shingrix (1 of 2) 11/16/2022 (Originally 08/27/1976)   HEMOGLOBIN A1C  03/19/2023   Diabetic kidney evaluation - eGFR measurement  09/21/2023   DTaP/Tdap/Td (3 - Td or Tdap) 08/31/2031   HIV Screening  Completed   HPV VACCINES  Aged Out    Objective: BP 118/70   Pulse 77   Resp 16   Ht 5' 10.6" (1.793 m)   Wt 186 lb 14.4 oz (84.8 kg)   SpO2 100%   BMI 26.36 kg/m   Physical Exam Constitutional:      Appearance: Normal appearance.  HENT:     Head: Normocephalic and atraumatic.      Mouth: Mucous membranes are moist.  Eyes:    Conjunctiva/sclera: Conjunctivae normal.     Pupils:    Cardiovascular:     Rate and Rhythm: Normal rate and regular rhythm.     Heart sounds:   Pulmonary:     Effort: Pulmonary effort is normal.     Breath sounds  Abdominal:     General: Non distended     Palpations: soft.   Musculoskeletal:        General: Normal range of motion. Healing Left BKA site with no signs of infection and intact sutures     Skin:    General: Skin is warm and dry.     Comments:  Neurological:     General: grossly non focal     Mental Status: awake, alert and oriented to person, place, and time.   Psychiatric:        Mood and Affect: Mood normal.   Lab Results Lab Results  Component Value Date   WBC 14.4 (H) 09/21/2022   HGB 8.9 (L) 09/21/2022    HCT 26.6 (  L) 09/21/2022   MCV 81.6 09/21/2022   PLT 729 (H) 09/21/2022    Lab Results  Component Value Date   CREATININE 0.63 09/21/2022   BUN 13 09/21/2022   NA 132 (L) 09/21/2022   K 3.6 09/21/2022   CL 98 09/21/2022   CO2 27 09/21/2022    Lab Results  Component Value Date   ALT 15 09/21/2022   AST 17 09/21/2022   ALKPHOS 77 09/21/2022   BILITOT 0.4 09/21/2022    No results found for: "CHOL", "HDL", "LDLCALC", "LDLDIRECT", "TRIG", "CHOLHDL" No results found for: "LABRPR", "RPRTITER" No results found for: "HIV1RNAQUANT", "HIV1RNAVL", "CD4TABS"  Assessment/Plan 67 YO male with prior h/o prostate cancer s/p prostatectomy/lymphadenectomy, GERD, kidney stones, CVA, uncontrolled DM2 w peripheral neuropathy, PVD s/p vascular intervention in 08/03/2022, HTN s/p left 2nd toe amputation in 05/31/22 s/p prolonged PO abtx with    # left DFU/abscess and osteomyelitis. -  7/28 S/p BKA.  Patient had purulence that extended up to the amputation site.  The muscle was healthy and viable no necrotic tissue or fascia. There is no infection proximal to the amputation site Tissue and purulence from the amputation site sent for cultures. Cx Proteus mirabilis. Per Dr Lajoyce Corners, no concerns for remaining osteomyelitis  # Prevotella bivia/Proteus mirbailis bacteremia 2/2 above   Plan  Complete course of PO augmentin, EOT 10/14/22 Labs today  Fu with Ortho as instructed  Fu in a month   # Medication Management  Labs today   I have personally spent 40 minutes involved in face-to-face and non-face-to-face activities for this patient on the day of the visit. Professional time spent includes the following activities: Preparing to see the patient (review of tests), Obtaining and/or reviewing separately obtained history (admission/discharge record), Performing a medically appropriate examination and/or evaluation , Ordering medications/tests/procedures, referring and communicating with other health care  professionals, Documenting clinical information in the EMR, Independently interpreting results (not separately reported), Communicating results to the patient/family/caregiver, Counseling and educating the patient/family/caregiver and Care coordination (not separately reported).   Victoriano Lain, MD [facility] 10/04/2022, 11:16 AM

## 2022-10-05 ENCOUNTER — Other Ambulatory Visit: Payer: Self-pay

## 2022-10-05 ENCOUNTER — Telehealth: Payer: Self-pay

## 2022-10-05 DIAGNOSIS — I10 Essential (primary) hypertension: Secondary | ICD-10-CM | POA: Diagnosis not present

## 2022-10-05 DIAGNOSIS — Z993 Dependence on wheelchair: Secondary | ICD-10-CM | POA: Diagnosis not present

## 2022-10-05 DIAGNOSIS — E871 Hypo-osmolality and hyponatremia: Secondary | ICD-10-CM | POA: Diagnosis not present

## 2022-10-05 DIAGNOSIS — Z8673 Personal history of transient ischemic attack (TIA), and cerebral infarction without residual deficits: Secondary | ICD-10-CM | POA: Diagnosis not present

## 2022-10-05 DIAGNOSIS — K219 Gastro-esophageal reflux disease without esophagitis: Secondary | ICD-10-CM | POA: Diagnosis not present

## 2022-10-05 DIAGNOSIS — E43 Unspecified severe protein-calorie malnutrition: Secondary | ICD-10-CM | POA: Diagnosis not present

## 2022-10-05 DIAGNOSIS — Z4781 Encounter for orthopedic aftercare following surgical amputation: Secondary | ICD-10-CM | POA: Diagnosis not present

## 2022-10-05 DIAGNOSIS — E1142 Type 2 diabetes mellitus with diabetic polyneuropathy: Secondary | ICD-10-CM | POA: Diagnosis not present

## 2022-10-05 DIAGNOSIS — E1151 Type 2 diabetes mellitus with diabetic peripheral angiopathy without gangrene: Secondary | ICD-10-CM | POA: Diagnosis not present

## 2022-10-05 DIAGNOSIS — Z794 Long term (current) use of insulin: Secondary | ICD-10-CM | POA: Diagnosis not present

## 2022-10-05 DIAGNOSIS — Z7982 Long term (current) use of aspirin: Secondary | ICD-10-CM | POA: Diagnosis not present

## 2022-10-05 DIAGNOSIS — Z87891 Personal history of nicotine dependence: Secondary | ICD-10-CM | POA: Diagnosis not present

## 2022-10-05 DIAGNOSIS — D649 Anemia, unspecified: Secondary | ICD-10-CM | POA: Diagnosis not present

## 2022-10-05 DIAGNOSIS — Z89512 Acquired absence of left leg below knee: Secondary | ICD-10-CM | POA: Diagnosis not present

## 2022-10-05 DIAGNOSIS — Z7985 Long-term (current) use of injectable non-insulin antidiabetic drugs: Secondary | ICD-10-CM | POA: Diagnosis not present

## 2022-10-05 DIAGNOSIS — Z7901 Long term (current) use of anticoagulants: Secondary | ICD-10-CM | POA: Diagnosis not present

## 2022-10-05 LAB — COMPREHENSIVE METABOLIC PANEL
AG Ratio: 0.9 (calc) — ABNORMAL LOW (ref 1.0–2.5)
ALT: 17 U/L (ref 9–46)
AST: 13 U/L (ref 10–35)
Albumin: 3.4 g/dL — ABNORMAL LOW (ref 3.6–5.1)
Alkaline phosphatase (APISO): 90 U/L (ref 35–144)
BUN/Creatinine Ratio: 37 (calc) — ABNORMAL HIGH (ref 6–22)
BUN: 26 mg/dL — ABNORMAL HIGH (ref 7–25)
CO2: 29 mmol/L (ref 20–32)
Calcium: 9.1 mg/dL (ref 8.6–10.3)
Chloride: 96 mmol/L — ABNORMAL LOW (ref 98–110)
Creat: 0.71 mg/dL (ref 0.70–1.35)
Globulin: 3.8 g/dL — ABNORMAL HIGH (ref 1.9–3.7)
Glucose, Bld: 241 mg/dL — ABNORMAL HIGH (ref 65–99)
Potassium: 5 mmol/L (ref 3.5–5.3)
Sodium: 132 mmol/L — ABNORMAL LOW (ref 135–146)
Total Bilirubin: 0.5 mg/dL (ref 0.2–1.2)
Total Protein: 7.2 g/dL (ref 6.1–8.1)

## 2022-10-05 LAB — CBC
HCT: 33.8 % — ABNORMAL LOW (ref 38.5–50.0)
Hemoglobin: 10.8 g/dL — ABNORMAL LOW (ref 13.2–17.1)
MCH: 26.4 pg — ABNORMAL LOW (ref 27.0–33.0)
MCHC: 32 g/dL (ref 32.0–36.0)
MCV: 82.6 fL (ref 80.0–100.0)
MPV: 9.6 fL (ref 7.5–12.5)
Platelets: 377 10*3/uL (ref 140–400)
RBC: 4.09 10*6/uL — ABNORMAL LOW (ref 4.20–5.80)
RDW: 15.2 % — ABNORMAL HIGH (ref 11.0–15.0)
WBC: 14.3 10*3/uL — ABNORMAL HIGH (ref 3.8–10.8)

## 2022-10-05 LAB — C-REACTIVE PROTEIN: CRP: 36.7 mg/L — ABNORMAL HIGH (ref <8.0)

## 2022-10-05 LAB — SEDIMENTATION RATE: Sed Rate: 104 mm/h — ABNORMAL HIGH (ref 0–20)

## 2022-10-05 MED ORDER — TIRZEPATIDE 5 MG/0.5ML ~~LOC~~ SOAJ: 5.0000 mg | SUBCUTANEOUS | 0 refills | Status: DC

## 2022-10-05 NOTE — Telephone Encounter (Signed)
Patient is aware of results and had no further questions. 

## 2022-10-05 NOTE — Telephone Encounter (Signed)
-----   Message from Victoriano Lain sent at 10/05/2022  1:57 PM EDT ----- Please let him know that labs are negative for any acute abnormality   Some elevation in inflammatory markers, however wound is healing well and nothing further to do except completing course of antibiotics as discussed  and fu with Orthopedics.

## 2022-10-05 NOTE — Telephone Encounter (Signed)
Patient informed of lab results and verbalized understanding.  Grant Yang  

## 2022-10-06 ENCOUNTER — Telehealth: Payer: Self-pay | Admitting: Internal Medicine

## 2022-10-06 DIAGNOSIS — Z7901 Long term (current) use of anticoagulants: Secondary | ICD-10-CM | POA: Diagnosis not present

## 2022-10-06 DIAGNOSIS — Z89512 Acquired absence of left leg below knee: Secondary | ICD-10-CM | POA: Diagnosis not present

## 2022-10-06 DIAGNOSIS — E1142 Type 2 diabetes mellitus with diabetic polyneuropathy: Secondary | ICD-10-CM | POA: Diagnosis not present

## 2022-10-06 DIAGNOSIS — I10 Essential (primary) hypertension: Secondary | ICD-10-CM | POA: Diagnosis not present

## 2022-10-06 DIAGNOSIS — Z4781 Encounter for orthopedic aftercare following surgical amputation: Secondary | ICD-10-CM | POA: Diagnosis not present

## 2022-10-06 DIAGNOSIS — E1151 Type 2 diabetes mellitus with diabetic peripheral angiopathy without gangrene: Secondary | ICD-10-CM | POA: Diagnosis not present

## 2022-10-06 DIAGNOSIS — D649 Anemia, unspecified: Secondary | ICD-10-CM | POA: Diagnosis not present

## 2022-10-06 DIAGNOSIS — E43 Unspecified severe protein-calorie malnutrition: Secondary | ICD-10-CM | POA: Diagnosis not present

## 2022-10-06 DIAGNOSIS — Z87891 Personal history of nicotine dependence: Secondary | ICD-10-CM | POA: Diagnosis not present

## 2022-10-06 DIAGNOSIS — Z7985 Long-term (current) use of injectable non-insulin antidiabetic drugs: Secondary | ICD-10-CM | POA: Diagnosis not present

## 2022-10-06 DIAGNOSIS — Z794 Long term (current) use of insulin: Secondary | ICD-10-CM | POA: Diagnosis not present

## 2022-10-06 DIAGNOSIS — Z7982 Long term (current) use of aspirin: Secondary | ICD-10-CM | POA: Diagnosis not present

## 2022-10-06 DIAGNOSIS — K219 Gastro-esophageal reflux disease without esophagitis: Secondary | ICD-10-CM | POA: Diagnosis not present

## 2022-10-06 DIAGNOSIS — Z993 Dependence on wheelchair: Secondary | ICD-10-CM | POA: Diagnosis not present

## 2022-10-06 DIAGNOSIS — E871 Hypo-osmolality and hyponatremia: Secondary | ICD-10-CM | POA: Diagnosis not present

## 2022-10-06 DIAGNOSIS — Z8673 Personal history of transient ischemic attack (TIA), and cerebral infarction without residual deficits: Secondary | ICD-10-CM | POA: Diagnosis not present

## 2022-10-06 NOTE — Telephone Encounter (Signed)
Called patient back says there must have been some sort of confusion- assuming the spot was for his foot, which he no longer has. Thank you

## 2022-10-06 NOTE — Telephone Encounter (Signed)
Noted  

## 2022-10-06 NOTE — Telephone Encounter (Signed)
Patient Grant Yang says he got a call from a doctor in [place] saying Dr Durwin Nora referred him there for some "spots"- unsure of what this is for. Please advise Thank you

## 2022-10-10 DIAGNOSIS — Z7982 Long term (current) use of aspirin: Secondary | ICD-10-CM | POA: Diagnosis not present

## 2022-10-10 DIAGNOSIS — Z7985 Long-term (current) use of injectable non-insulin antidiabetic drugs: Secondary | ICD-10-CM | POA: Diagnosis not present

## 2022-10-10 DIAGNOSIS — Z87891 Personal history of nicotine dependence: Secondary | ICD-10-CM | POA: Diagnosis not present

## 2022-10-10 DIAGNOSIS — Z993 Dependence on wheelchair: Secondary | ICD-10-CM | POA: Diagnosis not present

## 2022-10-10 DIAGNOSIS — E43 Unspecified severe protein-calorie malnutrition: Secondary | ICD-10-CM | POA: Diagnosis not present

## 2022-10-10 DIAGNOSIS — Z7901 Long term (current) use of anticoagulants: Secondary | ICD-10-CM | POA: Diagnosis not present

## 2022-10-10 DIAGNOSIS — E1151 Type 2 diabetes mellitus with diabetic peripheral angiopathy without gangrene: Secondary | ICD-10-CM | POA: Diagnosis not present

## 2022-10-10 DIAGNOSIS — K219 Gastro-esophageal reflux disease without esophagitis: Secondary | ICD-10-CM | POA: Diagnosis not present

## 2022-10-10 DIAGNOSIS — Z4781 Encounter for orthopedic aftercare following surgical amputation: Secondary | ICD-10-CM | POA: Diagnosis not present

## 2022-10-10 DIAGNOSIS — Z89512 Acquired absence of left leg below knee: Secondary | ICD-10-CM | POA: Diagnosis not present

## 2022-10-10 DIAGNOSIS — E1142 Type 2 diabetes mellitus with diabetic polyneuropathy: Secondary | ICD-10-CM | POA: Diagnosis not present

## 2022-10-10 DIAGNOSIS — Z794 Long term (current) use of insulin: Secondary | ICD-10-CM | POA: Diagnosis not present

## 2022-10-10 DIAGNOSIS — D649 Anemia, unspecified: Secondary | ICD-10-CM | POA: Diagnosis not present

## 2022-10-10 DIAGNOSIS — I10 Essential (primary) hypertension: Secondary | ICD-10-CM | POA: Diagnosis not present

## 2022-10-10 DIAGNOSIS — E871 Hypo-osmolality and hyponatremia: Secondary | ICD-10-CM | POA: Diagnosis not present

## 2022-10-10 DIAGNOSIS — Z8673 Personal history of transient ischemic attack (TIA), and cerebral infarction without residual deficits: Secondary | ICD-10-CM | POA: Diagnosis not present

## 2022-10-11 ENCOUNTER — Ambulatory Visit: Payer: Self-pay | Admitting: *Deleted

## 2022-10-11 ENCOUNTER — Ambulatory Visit: Payer: Medicare Other

## 2022-10-11 LAB — HM DIABETES EYE EXAM

## 2022-10-11 NOTE — Patient Outreach (Signed)
  Care Coordination   Initial Visit Note   10/11/2022 Name: Grant Yang MRN: [id number] DOB: 03/19/1955  Grant Yang is a 67 y.o. year old male who sees Durwin Nora, [name], MD for primary care. I spoke with  Grant Yang by phone today.  What matters to the patients health and wellness today?  Presently at his pcp office at the time of this outreach for his hospital follow up. He voiced understanding of [facility] care coordination services discussed and agrees to another call later today  [phone] return call to patient He denied need for care coordination services at this time   Goals Addressed   None     SDOH assessments and interventions completed:  No     Care Coordination Interventions:  Yes, provided   Follow up plan: No further intervention required.   Encounter Outcome:  Pt. Visit Completed   Marializ Ferrebee L. Noelle Penner, RN, BSN, CCM [facility] Care Management Community Coordinator Office number [phone]

## 2022-10-12 DIAGNOSIS — Z794 Long term (current) use of insulin: Secondary | ICD-10-CM | POA: Diagnosis not present

## 2022-10-12 DIAGNOSIS — Z87891 Personal history of nicotine dependence: Secondary | ICD-10-CM | POA: Diagnosis not present

## 2022-10-12 DIAGNOSIS — I10 Essential (primary) hypertension: Secondary | ICD-10-CM | POA: Diagnosis not present

## 2022-10-12 DIAGNOSIS — D649 Anemia, unspecified: Secondary | ICD-10-CM | POA: Diagnosis not present

## 2022-10-12 DIAGNOSIS — Z4781 Encounter for orthopedic aftercare following surgical amputation: Secondary | ICD-10-CM | POA: Diagnosis not present

## 2022-10-12 DIAGNOSIS — Z7985 Long-term (current) use of injectable non-insulin antidiabetic drugs: Secondary | ICD-10-CM | POA: Diagnosis not present

## 2022-10-12 DIAGNOSIS — Z89512 Acquired absence of left leg below knee: Secondary | ICD-10-CM | POA: Diagnosis not present

## 2022-10-12 DIAGNOSIS — Z7901 Long term (current) use of anticoagulants: Secondary | ICD-10-CM | POA: Diagnosis not present

## 2022-10-12 DIAGNOSIS — E43 Unspecified severe protein-calorie malnutrition: Secondary | ICD-10-CM | POA: Diagnosis not present

## 2022-10-12 DIAGNOSIS — Z993 Dependence on wheelchair: Secondary | ICD-10-CM | POA: Diagnosis not present

## 2022-10-12 DIAGNOSIS — K219 Gastro-esophageal reflux disease without esophagitis: Secondary | ICD-10-CM | POA: Diagnosis not present

## 2022-10-12 DIAGNOSIS — E1142 Type 2 diabetes mellitus with diabetic polyneuropathy: Secondary | ICD-10-CM | POA: Diagnosis not present

## 2022-10-12 DIAGNOSIS — E871 Hypo-osmolality and hyponatremia: Secondary | ICD-10-CM | POA: Diagnosis not present

## 2022-10-12 DIAGNOSIS — Z7982 Long term (current) use of aspirin: Secondary | ICD-10-CM | POA: Diagnosis not present

## 2022-10-12 DIAGNOSIS — Z8673 Personal history of transient ischemic attack (TIA), and cerebral infarction without residual deficits: Secondary | ICD-10-CM | POA: Diagnosis not present

## 2022-10-12 DIAGNOSIS — E1151 Type 2 diabetes mellitus with diabetic peripheral angiopathy without gangrene: Secondary | ICD-10-CM | POA: Diagnosis not present

## 2022-10-13 DIAGNOSIS — Z7982 Long term (current) use of aspirin: Secondary | ICD-10-CM | POA: Diagnosis not present

## 2022-10-13 DIAGNOSIS — Z89512 Acquired absence of left leg below knee: Secondary | ICD-10-CM | POA: Diagnosis not present

## 2022-10-13 DIAGNOSIS — Z7985 Long-term (current) use of injectable non-insulin antidiabetic drugs: Secondary | ICD-10-CM | POA: Diagnosis not present

## 2022-10-13 DIAGNOSIS — I10 Essential (primary) hypertension: Secondary | ICD-10-CM | POA: Diagnosis not present

## 2022-10-13 DIAGNOSIS — E1142 Type 2 diabetes mellitus with diabetic polyneuropathy: Secondary | ICD-10-CM | POA: Diagnosis not present

## 2022-10-13 DIAGNOSIS — K219 Gastro-esophageal reflux disease without esophagitis: Secondary | ICD-10-CM | POA: Diagnosis not present

## 2022-10-13 DIAGNOSIS — E43 Unspecified severe protein-calorie malnutrition: Secondary | ICD-10-CM | POA: Diagnosis not present

## 2022-10-13 DIAGNOSIS — E871 Hypo-osmolality and hyponatremia: Secondary | ICD-10-CM | POA: Diagnosis not present

## 2022-10-13 DIAGNOSIS — Z87891 Personal history of nicotine dependence: Secondary | ICD-10-CM | POA: Diagnosis not present

## 2022-10-13 DIAGNOSIS — Z7901 Long term (current) use of anticoagulants: Secondary | ICD-10-CM | POA: Diagnosis not present

## 2022-10-13 DIAGNOSIS — E1151 Type 2 diabetes mellitus with diabetic peripheral angiopathy without gangrene: Secondary | ICD-10-CM | POA: Diagnosis not present

## 2022-10-13 DIAGNOSIS — Z794 Long term (current) use of insulin: Secondary | ICD-10-CM | POA: Diagnosis not present

## 2022-10-13 DIAGNOSIS — D649 Anemia, unspecified: Secondary | ICD-10-CM | POA: Diagnosis not present

## 2022-10-13 DIAGNOSIS — Z4781 Encounter for orthopedic aftercare following surgical amputation: Secondary | ICD-10-CM | POA: Diagnosis not present

## 2022-10-13 DIAGNOSIS — Z8673 Personal history of transient ischemic attack (TIA), and cerebral infarction without residual deficits: Secondary | ICD-10-CM | POA: Diagnosis not present

## 2022-10-13 DIAGNOSIS — Z993 Dependence on wheelchair: Secondary | ICD-10-CM | POA: Diagnosis not present

## 2022-10-16 ENCOUNTER — Other Ambulatory Visit: Payer: Self-pay

## 2022-10-16 ENCOUNTER — Telehealth: Payer: Self-pay | Admitting: Internal Medicine

## 2022-10-16 DIAGNOSIS — Z4781 Encounter for orthopedic aftercare following surgical amputation: Secondary | ICD-10-CM | POA: Diagnosis not present

## 2022-10-16 DIAGNOSIS — Z7982 Long term (current) use of aspirin: Secondary | ICD-10-CM | POA: Diagnosis not present

## 2022-10-16 DIAGNOSIS — E1142 Type 2 diabetes mellitus with diabetic polyneuropathy: Secondary | ICD-10-CM | POA: Diagnosis not present

## 2022-10-16 DIAGNOSIS — Z993 Dependence on wheelchair: Secondary | ICD-10-CM | POA: Diagnosis not present

## 2022-10-16 DIAGNOSIS — D649 Anemia, unspecified: Secondary | ICD-10-CM | POA: Diagnosis not present

## 2022-10-16 DIAGNOSIS — E1165 Type 2 diabetes mellitus with hyperglycemia: Secondary | ICD-10-CM

## 2022-10-16 DIAGNOSIS — K219 Gastro-esophageal reflux disease without esophagitis: Secondary | ICD-10-CM | POA: Diagnosis not present

## 2022-10-16 DIAGNOSIS — I10 Essential (primary) hypertension: Secondary | ICD-10-CM | POA: Diagnosis not present

## 2022-10-16 DIAGNOSIS — E43 Unspecified severe protein-calorie malnutrition: Secondary | ICD-10-CM | POA: Diagnosis not present

## 2022-10-16 DIAGNOSIS — Z89512 Acquired absence of left leg below knee: Secondary | ICD-10-CM | POA: Diagnosis not present

## 2022-10-16 DIAGNOSIS — Z794 Long term (current) use of insulin: Secondary | ICD-10-CM | POA: Diagnosis not present

## 2022-10-16 DIAGNOSIS — Z87891 Personal history of nicotine dependence: Secondary | ICD-10-CM | POA: Diagnosis not present

## 2022-10-16 DIAGNOSIS — Z7901 Long term (current) use of anticoagulants: Secondary | ICD-10-CM | POA: Diagnosis not present

## 2022-10-16 DIAGNOSIS — E871 Hypo-osmolality and hyponatremia: Secondary | ICD-10-CM | POA: Diagnosis not present

## 2022-10-16 DIAGNOSIS — Z8673 Personal history of transient ischemic attack (TIA), and cerebral infarction without residual deficits: Secondary | ICD-10-CM | POA: Diagnosis not present

## 2022-10-16 DIAGNOSIS — Z7985 Long-term (current) use of injectable non-insulin antidiabetic drugs: Secondary | ICD-10-CM | POA: Diagnosis not present

## 2022-10-16 DIAGNOSIS — E1151 Type 2 diabetes mellitus with diabetic peripheral angiopathy without gangrene: Secondary | ICD-10-CM | POA: Diagnosis not present

## 2022-10-16 MED ORDER — DEXCOM G7 SENSOR MISC
1.0000 [IU] | 2 refills | Status: DC
Start: 2022-10-16 — End: 2023-01-10

## 2022-10-16 NOTE — Telephone Encounter (Signed)
Prescription Request  10/16/2022  LOV: 09/04/2022  What is the name of the medication or equipment? Continuous Blood Gluc Sensor (DEXCOM G7 SENSOR) MISC [[id number]]   Have you contacted your pharmacy to request a refill? No   Which pharmacy would you like this sent to?  [facility]   Patient notified that their request is being sent to the clinical staff for review and that they should receive a response within 2 business days.   Please advise at Mobile [phone] (mobile)

## 2022-10-16 NOTE — Telephone Encounter (Signed)
Refills sent

## 2022-10-18 ENCOUNTER — Ambulatory Visit: Payer: Medicare Other | Admitting: Family

## 2022-10-18 ENCOUNTER — Encounter: Payer: Self-pay | Admitting: Family

## 2022-10-18 DIAGNOSIS — M869 Osteomyelitis, unspecified: Secondary | ICD-10-CM

## 2022-10-18 NOTE — Progress Notes (Signed)
Post-Op Visit Note   Patient: Grant Yang           Date of Birth: 19-Jul-1955           MRN: [id number] Visit Date: 10/18/2022 PCP: Billie Lade, MD  Chief Complaint:  Chief Complaint  Patient presents with   Left Leg - Routine Post Op    09/17/2022 left BKA     HPI:  HPI The patient is a 67 year old gentleman seen status post left below-knee amputation July 28.  His staples are in place.  He currently has been working with [facility] and is using a size large shrinker Ortho Exam On examination of the left residual limb this is well consolidated well-healed staples in place there is no erythema or drainage  Visit Diagnoses: No diagnosis found.  Plan: Staples harvested today without incident.  Proceed with prosthesis set up.  Follow-up in the office in 4 weeks.  Follow-Up Instructions: No follow-ups on file.   Imaging: No results found.  Orders:  No orders of the defined types were placed in this encounter.  No orders of the defined types were placed in this encounter.    PMFS History: Patient Active Problem List   Diagnosis Date Noted   Severe protein-calorie malnutrition (HCC) 09/14/2022   Osteomyelitis of left lower extremity (HCC) 09/13/2022   Severe sepsis (HCC) 09/13/2022   Penetrating foot wound, left, initial encounter 09/13/2022   AKI (acute kidney injury) (HCC) 09/13/2022   High anion gap metabolic acidosis 09/13/2022   Dehydration 09/13/2022   Acute prerenal azotemia 09/13/2022   DM2 (diabetes mellitus, type 2) (HCC) 09/13/2022   Osteomyelitis of foot, left, acute (HCC) 09/04/2022   PAD (peripheral artery disease) (HCC) 08/16/2022   Hospital discharge follow-up 08/16/2022   Foot ulcer, left (HCC) 07/30/2022   History of CVA (cerebrovascular accident) 07/05/2022   Medication management 06/27/2022   Acute osteomyelitis of left foot (HCC) 05/30/2022   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) 05/30/2022   Cellulitis  of left leg 05/29/2022   COPD suggested by initial evaluation (HCC) 05/29/2022   Prostate cancer (HCC) 01/30/2017   Cellulitis of left hand 02/01/17 I and D 01/30/2017   Essential hypertension 01/30/2017   Uncontrolled type 2 diabetes mellitus with hyperglycemia (HCC) 01/30/2017   Acute hyponatremia 12/29/2010   Past Medical History:  Diagnosis Date   Cancer (HCC)    prostate cancer    Diabetes mellitus    GERD (gastroesophageal reflux disease)    History of kidney stones    Hypertension    Neuropathy in diabetes (HCC)    bilateral lower extremities    Shortness of breath    patient states it hurts to breath; this was when he use to smoke cigarettes    Stroke [facility]) 2010   "mini stroke" patient cant remember when; states it happened in w[place] in 2010 . denies any residual deficits     Family History  Problem Relation Age of Onset   Heart failure Mother    Cancer Father     Past Surgical History:  Procedure Laterality Date   ABDOMINAL AORTOGRAM W/LOWER EXTREMITY N/A 07/31/2022   Procedure: ABDOMINAL AORTOGRAM W/LOWER EXTREMITY;  Surgeon: Leonie Douglas, MD;  Location: [facility] INV[facility] CV LAB;  Service: Cardiovascular;  Laterality: N/A;   AMPUTATION Left 05/31/2022   Procedure: LEFT 2ND TOE AMPUTATION;  Surgeon: Nadara Mustard, MD;  Location: [facility] OR;  Service: Orthopedics;  Laterality: Left;  AMPUTATION Left 09/17/2022   Procedure: AMPUTATION BELOW KNEE;  Surgeon: Nadara Mustard, MD;  Location: [facility] OR;  Service: Orthopedics;  Laterality: Left;   APPENDECTOMY     APPLICATION OF WOUND VAC Left 09/17/2022   Procedure: APPLICATION OF WOUND VAC;  Surgeon: Nadara Mustard, MD;  Location: [facility] OR;  Service: Orthopedics;  Laterality: Left;   CHOLECYSTECTOMY  12/30/2010   Procedure: LAPAROSCOPIC CHOLECYSTECTOMY;  Surgeon: Dalia Heading;  Location: [facility] ORS;  Service: General;  Laterality: N/A;   I & D EXTREMITY Left 05/31/2022   Procedure: LEFT FOOT DEBRIDEMENT;  Surgeon: Nadara Mustard, MD;   Location: [facility] OR;  Service: Orthopedics;  Laterality: Left;   INCISION AND DRAINAGE ABSCESS Left 02/01/2017   Procedure: INCISION AND DRAINAGE ABSCESS LEFT HAND;  Surgeon: Vickki Hearing, MD;  Location: [facility] ORS;  Service: Orthopedics;  Laterality: Left;   LOWER EXTREMITY ANGIOGRAPHY N/A 08/03/2022   Procedure: Lower Extremity Angiography;  Surgeon: Cephus Shelling, MD;  Location: [facility] INVASIVE CV LAB;  Service: Cardiovascular;  Laterality: N/A;   LYMPHADENECTOMY Bilateral 08/02/2017   Procedure: LYMPHADENECTOMY, PELVIC;  Surgeon: Heloise Purpura, MD;  Location: [facility] ORS;  Service: Urology;  Laterality: Bilateral;   PERIPHERAL VASCULAR BALLOON ANGIOPLASTY  08/03/2022   Procedure: PERIPHERAL VASCULAR BALLOON ANGIOPLASTY;  Surgeon: Cephus Shelling, MD;  [facility]: [facility];  Service: Cardiovascular;;   ROBOT ASSISTED LAPAROSCOPIC RADICAL PROSTATECTOMY N/A 08/02/2017   Procedure: XI ROBOTIC ASSISTED LAPAROSCOPIC RADICAL PROSTATECTOMY LEVEL 2;  Surgeon: Heloise Purpura, MD;  Location: [facility] ORS;  Service: Urology;  Laterality: N/A;   Social History   Occupational History   Not on file  Tobacco Use   Smoking status: Former    Current packs/day: 0.00    Average packs/day: 2.0 packs/day for 38.0 years (76.0 ttl pk-yrs)    Types: Cigarettes    Start date: 31    Quit date: 2010    Years since quitting: 14.6   Smokeless tobacco: Never  Substance and Sexual Activity   Alcohol use: No   Drug use: Yes    Types: Marijuana    Comment: occ   Sexual activity: Yes

## 2022-10-19 DIAGNOSIS — D649 Anemia, unspecified: Secondary | ICD-10-CM | POA: Diagnosis not present

## 2022-10-19 DIAGNOSIS — Z794 Long term (current) use of insulin: Secondary | ICD-10-CM | POA: Diagnosis not present

## 2022-10-19 DIAGNOSIS — E43 Unspecified severe protein-calorie malnutrition: Secondary | ICD-10-CM | POA: Diagnosis not present

## 2022-10-19 DIAGNOSIS — Z993 Dependence on wheelchair: Secondary | ICD-10-CM | POA: Diagnosis not present

## 2022-10-19 DIAGNOSIS — Z7982 Long term (current) use of aspirin: Secondary | ICD-10-CM | POA: Diagnosis not present

## 2022-10-19 DIAGNOSIS — Z7901 Long term (current) use of anticoagulants: Secondary | ICD-10-CM | POA: Diagnosis not present

## 2022-10-19 DIAGNOSIS — K219 Gastro-esophageal reflux disease without esophagitis: Secondary | ICD-10-CM | POA: Diagnosis not present

## 2022-10-19 DIAGNOSIS — E871 Hypo-osmolality and hyponatremia: Secondary | ICD-10-CM | POA: Diagnosis not present

## 2022-10-19 DIAGNOSIS — Z87891 Personal history of nicotine dependence: Secondary | ICD-10-CM | POA: Diagnosis not present

## 2022-10-19 DIAGNOSIS — Z4781 Encounter for orthopedic aftercare following surgical amputation: Secondary | ICD-10-CM | POA: Diagnosis not present

## 2022-10-19 DIAGNOSIS — Z8673 Personal history of transient ischemic attack (TIA), and cerebral infarction without residual deficits: Secondary | ICD-10-CM | POA: Diagnosis not present

## 2022-10-19 DIAGNOSIS — I10 Essential (primary) hypertension: Secondary | ICD-10-CM | POA: Diagnosis not present

## 2022-10-19 DIAGNOSIS — E1151 Type 2 diabetes mellitus with diabetic peripheral angiopathy without gangrene: Secondary | ICD-10-CM | POA: Diagnosis not present

## 2022-10-19 DIAGNOSIS — Z89512 Acquired absence of left leg below knee: Secondary | ICD-10-CM | POA: Diagnosis not present

## 2022-10-19 DIAGNOSIS — E1142 Type 2 diabetes mellitus with diabetic polyneuropathy: Secondary | ICD-10-CM | POA: Diagnosis not present

## 2022-10-19 DIAGNOSIS — Z7985 Long-term (current) use of injectable non-insulin antidiabetic drugs: Secondary | ICD-10-CM | POA: Diagnosis not present

## 2022-10-24 DIAGNOSIS — Z89512 Acquired absence of left leg below knee: Secondary | ICD-10-CM | POA: Diagnosis not present

## 2022-10-24 DIAGNOSIS — Z993 Dependence on wheelchair: Secondary | ICD-10-CM | POA: Diagnosis not present

## 2022-10-24 DIAGNOSIS — Z8673 Personal history of transient ischemic attack (TIA), and cerebral infarction without residual deficits: Secondary | ICD-10-CM | POA: Diagnosis not present

## 2022-10-24 DIAGNOSIS — Z4781 Encounter for orthopedic aftercare following surgical amputation: Secondary | ICD-10-CM | POA: Diagnosis not present

## 2022-10-24 DIAGNOSIS — E1151 Type 2 diabetes mellitus with diabetic peripheral angiopathy without gangrene: Secondary | ICD-10-CM | POA: Diagnosis not present

## 2022-10-24 DIAGNOSIS — D649 Anemia, unspecified: Secondary | ICD-10-CM | POA: Diagnosis not present

## 2022-10-24 DIAGNOSIS — E1142 Type 2 diabetes mellitus with diabetic polyneuropathy: Secondary | ICD-10-CM | POA: Diagnosis not present

## 2022-10-24 DIAGNOSIS — Z7982 Long term (current) use of aspirin: Secondary | ICD-10-CM | POA: Diagnosis not present

## 2022-10-24 DIAGNOSIS — Z794 Long term (current) use of insulin: Secondary | ICD-10-CM | POA: Diagnosis not present

## 2022-10-24 DIAGNOSIS — I10 Essential (primary) hypertension: Secondary | ICD-10-CM | POA: Diagnosis not present

## 2022-10-24 DIAGNOSIS — Z7985 Long-term (current) use of injectable non-insulin antidiabetic drugs: Secondary | ICD-10-CM | POA: Diagnosis not present

## 2022-10-24 DIAGNOSIS — E871 Hypo-osmolality and hyponatremia: Secondary | ICD-10-CM | POA: Diagnosis not present

## 2022-10-24 DIAGNOSIS — K219 Gastro-esophageal reflux disease without esophagitis: Secondary | ICD-10-CM | POA: Diagnosis not present

## 2022-10-24 DIAGNOSIS — E43 Unspecified severe protein-calorie malnutrition: Secondary | ICD-10-CM | POA: Diagnosis not present

## 2022-10-24 DIAGNOSIS — Z87891 Personal history of nicotine dependence: Secondary | ICD-10-CM | POA: Diagnosis not present

## 2022-10-24 DIAGNOSIS — Z7901 Long term (current) use of anticoagulants: Secondary | ICD-10-CM | POA: Diagnosis not present

## 2022-10-25 ENCOUNTER — Other Ambulatory Visit: Payer: Self-pay

## 2022-10-25 DIAGNOSIS — I10 Essential (primary) hypertension: Secondary | ICD-10-CM

## 2022-10-25 MED ORDER — OLMESARTAN MEDOXOMIL 20 MG PO TABS
20.0000 mg | ORAL_TABLET | Freq: Every day | ORAL | 2 refills | Status: DC
Start: 2022-10-25 — End: 2023-02-19

## 2022-10-26 DIAGNOSIS — Z8673 Personal history of transient ischemic attack (TIA), and cerebral infarction without residual deficits: Secondary | ICD-10-CM | POA: Diagnosis not present

## 2022-10-26 DIAGNOSIS — Z993 Dependence on wheelchair: Secondary | ICD-10-CM | POA: Diagnosis not present

## 2022-10-26 DIAGNOSIS — Z89512 Acquired absence of left leg below knee: Secondary | ICD-10-CM | POA: Diagnosis not present

## 2022-10-26 DIAGNOSIS — K219 Gastro-esophageal reflux disease without esophagitis: Secondary | ICD-10-CM | POA: Diagnosis not present

## 2022-10-26 DIAGNOSIS — Z7985 Long-term (current) use of injectable non-insulin antidiabetic drugs: Secondary | ICD-10-CM | POA: Diagnosis not present

## 2022-10-26 DIAGNOSIS — E43 Unspecified severe protein-calorie malnutrition: Secondary | ICD-10-CM | POA: Diagnosis not present

## 2022-10-26 DIAGNOSIS — Z794 Long term (current) use of insulin: Secondary | ICD-10-CM | POA: Diagnosis not present

## 2022-10-26 DIAGNOSIS — Z7982 Long term (current) use of aspirin: Secondary | ICD-10-CM | POA: Diagnosis not present

## 2022-10-26 DIAGNOSIS — Z7901 Long term (current) use of anticoagulants: Secondary | ICD-10-CM | POA: Diagnosis not present

## 2022-10-26 DIAGNOSIS — D649 Anemia, unspecified: Secondary | ICD-10-CM | POA: Diagnosis not present

## 2022-10-26 DIAGNOSIS — I10 Essential (primary) hypertension: Secondary | ICD-10-CM | POA: Diagnosis not present

## 2022-10-26 DIAGNOSIS — Z87891 Personal history of nicotine dependence: Secondary | ICD-10-CM | POA: Diagnosis not present

## 2022-10-26 DIAGNOSIS — E871 Hypo-osmolality and hyponatremia: Secondary | ICD-10-CM | POA: Diagnosis not present

## 2022-10-26 DIAGNOSIS — E1151 Type 2 diabetes mellitus with diabetic peripheral angiopathy without gangrene: Secondary | ICD-10-CM | POA: Diagnosis not present

## 2022-10-26 DIAGNOSIS — Z4781 Encounter for orthopedic aftercare following surgical amputation: Secondary | ICD-10-CM | POA: Diagnosis not present

## 2022-10-26 DIAGNOSIS — E1142 Type 2 diabetes mellitus with diabetic polyneuropathy: Secondary | ICD-10-CM | POA: Diagnosis not present

## 2022-10-30 ENCOUNTER — Telehealth: Payer: Self-pay | Admitting: Internal Medicine

## 2022-10-30 NOTE — Telephone Encounter (Signed)
Mirica, RN with [facility] calling to inform Dr Durwin Nora that pt has not been taking his insulin as prescribed- pt son, who also has diabetes, has "coached" him on how to take his medication. Says what he was prescribed was not enough so has been mixing different insulins and medications- making his own "concoction" of insulin. Marinus Maw is wanting someone to speak with pt to express the dangers of doing this. Please advise Thank you Mirica # [phone]

## 2022-10-30 NOTE — Telephone Encounter (Signed)
Appt scheduled/ patient aware.

## 2022-11-01 ENCOUNTER — Telehealth: Payer: Self-pay | Admitting: Internal Medicine

## 2022-11-01 DIAGNOSIS — E43 Unspecified severe protein-calorie malnutrition: Secondary | ICD-10-CM | POA: Diagnosis not present

## 2022-11-01 DIAGNOSIS — Z87891 Personal history of nicotine dependence: Secondary | ICD-10-CM | POA: Diagnosis not present

## 2022-11-01 DIAGNOSIS — Z7901 Long term (current) use of anticoagulants: Secondary | ICD-10-CM | POA: Diagnosis not present

## 2022-11-01 DIAGNOSIS — Z993 Dependence on wheelchair: Secondary | ICD-10-CM | POA: Diagnosis not present

## 2022-11-01 DIAGNOSIS — E871 Hypo-osmolality and hyponatremia: Secondary | ICD-10-CM | POA: Diagnosis not present

## 2022-11-01 DIAGNOSIS — Z89512 Acquired absence of left leg below knee: Secondary | ICD-10-CM | POA: Diagnosis not present

## 2022-11-01 DIAGNOSIS — Z4781 Encounter for orthopedic aftercare following surgical amputation: Secondary | ICD-10-CM | POA: Diagnosis not present

## 2022-11-01 DIAGNOSIS — Z7982 Long term (current) use of aspirin: Secondary | ICD-10-CM | POA: Diagnosis not present

## 2022-11-01 DIAGNOSIS — Z8673 Personal history of transient ischemic attack (TIA), and cerebral infarction without residual deficits: Secondary | ICD-10-CM | POA: Diagnosis not present

## 2022-11-01 DIAGNOSIS — K219 Gastro-esophageal reflux disease without esophagitis: Secondary | ICD-10-CM | POA: Diagnosis not present

## 2022-11-01 DIAGNOSIS — E1151 Type 2 diabetes mellitus with diabetic peripheral angiopathy without gangrene: Secondary | ICD-10-CM | POA: Diagnosis not present

## 2022-11-01 DIAGNOSIS — D649 Anemia, unspecified: Secondary | ICD-10-CM | POA: Diagnosis not present

## 2022-11-01 DIAGNOSIS — Z794 Long term (current) use of insulin: Secondary | ICD-10-CM | POA: Diagnosis not present

## 2022-11-01 DIAGNOSIS — I10 Essential (primary) hypertension: Secondary | ICD-10-CM | POA: Diagnosis not present

## 2022-11-01 DIAGNOSIS — E1142 Type 2 diabetes mellitus with diabetic polyneuropathy: Secondary | ICD-10-CM | POA: Diagnosis not present

## 2022-11-01 DIAGNOSIS — Z7985 Long-term (current) use of injectable non-insulin antidiabetic drugs: Secondary | ICD-10-CM | POA: Diagnosis not present

## 2022-11-01 NOTE — Telephone Encounter (Signed)
Spoke with [place].

## 2022-11-01 NOTE — Telephone Encounter (Signed)
Tabitha called from [facility] home health [phone] fell over weekend hit his left stump incision open and now closed up now. 2 wounds on right hand, 1 one on fifth finger and 1 on the thumb on right  hand. [facility].  Need orders to treat those wounds.

## 2022-11-01 NOTE — Telephone Encounter (Signed)
Placard  Copied Noted Sleeved (front folder for appt on 09.17.2024)

## 2022-11-07 ENCOUNTER — Encounter: Payer: Self-pay | Admitting: Internal Medicine

## 2022-11-07 ENCOUNTER — Ambulatory Visit (INDEPENDENT_AMBULATORY_CARE_PROVIDER_SITE_OTHER): Payer: Medicare Other | Admitting: Internal Medicine

## 2022-11-07 VITALS — BP 119/71 | HR 76 | Ht 71.0 in

## 2022-11-07 DIAGNOSIS — E1165 Type 2 diabetes mellitus with hyperglycemia: Secondary | ICD-10-CM

## 2022-11-07 DIAGNOSIS — Z794 Long term (current) use of insulin: Secondary | ICD-10-CM | POA: Diagnosis not present

## 2022-11-07 DIAGNOSIS — Z89512 Acquired absence of left leg below knee: Secondary | ICD-10-CM | POA: Diagnosis not present

## 2022-11-07 DIAGNOSIS — Z2821 Immunization not carried out because of patient refusal: Secondary | ICD-10-CM | POA: Diagnosis not present

## 2022-11-07 DIAGNOSIS — S6992XA Unspecified injury of left wrist, hand and finger(s), initial encounter: Secondary | ICD-10-CM | POA: Diagnosis not present

## 2022-11-07 DIAGNOSIS — E782 Mixed hyperlipidemia: Secondary | ICD-10-CM | POA: Diagnosis not present

## 2022-11-07 MED ORDER — GLIPIZIDE 5 MG PO TABS: 5.0000 mg | ORAL_TABLET | Freq: Two times a day (BID) | ORAL | 3 refills | Status: DC

## 2022-11-07 NOTE — Assessment & Plan Note (Signed)
Due to cut injury Healing well Up-to-date with Tdap vaccine Keep area clean and dry

## 2022-11-07 NOTE — Assessment & Plan Note (Addendum)
Lab Results  Component Value Date   HGBA1C 9.8 (H) 09/16/2022   Uncontrolled, but improving On Novolin N, but as he takes it randomly, discontinue it - started Glipizide 5 mg BID On Mounjaro 5 mg qw currently Advised to contact if blood glucose less than 70 or more than 300 Advised to follow diabetic diet F/u CMP and lipid panel Diabetic eye exam: Advised to follow up with Ophthalmology for diabetic eye exam

## 2022-11-07 NOTE — Assessment & Plan Note (Signed)
On Lipitor Check lipid profile 

## 2022-11-07 NOTE — Patient Instructions (Signed)
Please start taking Glipizide 5 mg twice daily.  Please do not take insulin for now until next evaluation.  Please contact us if blood glucose is less than 70 or more than 300.  Please continue to take medications as prescribed.  Please continue to follow low carb diet.  Please get fasting blood tests done before the next visit.

## 2022-11-07 NOTE — Progress Notes (Signed)
Established Patient Office Visit  Subjective:  Patient ID: Grant Yang, male    DOB: 23-Jul-1955  Age: 67 y.o. MRN: [id number]  CC:  Chief Complaint  Patient presents with   Diabetes    Diabetes management     HPI Grant Yang is a 67 y.o. male with past medical history of osteomyelitis of the left lower extremity s/p left BKA, diabetes type 2, COPD, hypertension, CVA, pressure ulcer who  presents for f/u of his DM.  DM: He is tolerating Mounjaro 5 mg well. He is also using insulin aspart before meals that his son shared with him. He is taking 5-7 Units based on his own sliding scale. He has had blood glucose between 70-300 because if inconsistent dietary pattern. He has CGM now.  He has had left BKA due to osteomyelitis of foot and PAD. He is followed by wound clinic.  He had a cut injury on left thumb due to garbage bin, which has been healing well. Denies any active bleeding. He is up-to-date with Tdap.      Past Medical History:  Diagnosis Date   Cancer Grant Yang)    prostate cancer    Diabetes mellitus    GERD (gastroesophageal reflux disease)    History of kidney stones    Hypertension    Neuropathy in diabetes [facility])    bilateral lower extremities    Shortness of breath    patient states it hurts to breath; this was when he use to smoke cigarettes    Stroke [facility]) 2010   "mini stroke" patient cant remember when; states it happened in w. [place] in 2010 . denies any residual deficits     Past Surgical History:  Procedure Laterality Date   ABDOMINAL AORTOGRAM W/LOWER EXTREMITY N/A 07/31/2022   Procedure: ABDOMINAL AORTOGRAM W/LOWER EXTREMITY;  Surgeon: Leonie Douglas, MD;  [facility]: [facility];  Service: Cardiovascular;  Laterality: N/A;   AMPUTATION Left 05/31/2022   Procedure: LEFT 2ND TOE AMPUTATION;  Surgeon: Nadara Mustard, MD;  Location: [facility] OR;  Service: Orthopedics;  Laterality: Left;   AMPUTATION Left 09/17/2022   Procedure: AMPUTATION BELOW  KNEE;  Surgeon: Nadara Mustard, MD;  Location: [facility] OR;  Service: Orthopedics;  Laterality: Left;   APPENDECTOMY     APPLICATION OF WOUND VAC Left 09/17/2022   Procedure: APPLICATION OF WOUND VAC;  Surgeon: Nadara Mustard, MD;  Location: [facility] OR;  Service: Orthopedics;  Laterality: Left;   CHOLECYSTECTOMY  12/30/2010   Procedure: LAPAROSCOPIC CHOLECYSTECTOMY;  Surgeon: Dalia Heading;  Location: [facility] ORS;  Service: General;  Laterality: N/A;   I & D EXTREMITY Left 05/31/2022   Procedure: LEFT FOOT DEBRIDEMENT;  Surgeon: Nadara Mustard, MD;  Location: [facility] OR;  Service: Orthopedics;  Laterality: Left;   INCISION AND DRAINAGE ABSCESS Left 02/01/2017   Procedure: INCISION AND DRAINAGE ABSCESS LEFT HAND;  Surgeon: Vickki Hearing, MD;  Location: [facility];  Service: Orthopedics;  Laterality: Left;   LOWER EXTREMITY ANGIOGRAPHY N/A 08/03/2022   Procedure: Lower Extremity Angiography;  Surgeon: Cephus Shelling, MD;  Location: [facility] INVASIVE CV LAB;  Service: Cardiovascular;  Laterality: N/A;   LYMPHADENECTOMY Bilateral 08/02/2017   Procedure: LYMPHADENECTOMY, PELVIC;  Surgeon: Heloise Purpura, MD;  Location: [facility] ORS;  Service: Urology;  Laterality: Bilateral;   PERIPHERAL VASCULAR BALLOON ANGIOPLASTY  08/03/2022   Procedure: PERIPHERAL VASCULAR BALLOON ANGIOPLASTY;  Surgeon: Cephus Shelling, MD;  [facility]: [facility];  Service: Cardiovascular;;   ROBOT  ASSISTED LAPAROSCOPIC RADICAL PROSTATECTOMY N/A 08/02/2017   Procedure: XI ROBOTIC ASSISTED LAPAROSCOPIC RADICAL PROSTATECTOMY LEVEL 2;  Surgeon: Heloise Purpura, MD;  Location: [facility] ORS;  Service: Urology;  Laterality: N/A;    Family History  Problem Relation Age of Onset   Heart failure Mother    Cancer Father     Social History   Socioeconomic History   Marital status: Divorced    Spouse name: Not on file   Number of children: Not on file   Years of education: Not on file   Highest education level: Not on file  Occupational History   Not on  file  Tobacco Use   Smoking status: Former    Current packs/day: 0.00    Average packs/day: 2.0 packs/day for 38.0 years (76.0 ttl pk-yrs)    Types: Cigarettes    Start date: 25    Quit date: 2010    Years since quitting: 14.7   Smokeless tobacco: Never  Substance and Sexual Activity   Alcohol use: No   Drug use: Yes    Types: Marijuana    Comment: occ   Sexual activity: Yes  Other Topics Concern   Not on file  Social History Narrative   Not on file   Social Determinants of Health   Financial Resource Strain: Not on file  Food Insecurity: No Food Insecurity (09/27/2022)   Hunger Vital Sign    Worried About Running Out of Food in the Last Year: Never true    Ran Out of Food in the Last Year: Never true  Transportation Needs: No Transportation Needs (09/27/2022)   PRAPARE - Administrator, Civil Service (Medical): No    Lack of Transportation (Non-Medical): No  Physical Activity: Not on file  Stress: Not on file  Social Connections: Not on file  Intimate Partner Violence: Not At Risk (09/20/2022)   Humiliation, Afraid, Rape, and Kick questionnaire    Fear of Current or Ex-Partner: No    Emotionally Abused: No    Physically Abused: No    Sexually Abused: No    Outpatient Medications Prior to Visit  Medication Sig Dispense Refill   albuterol (VENTOLIN HFA) 108 (90 Base) MCG/ACT inhaler Inhale 2 puffs into the lungs every 6 (six) hours as needed for wheezing or shortness of breath. (Patient not taking: Reported on 10/04/2022) 8 g 0   ascorbic acid (VITAMIN C) 1000 MG tablet Take 1 tablet (1,000 mg total) by mouth daily. 30 tablet 0   aspirin EC 81 MG tablet Take 1 tablet (81 mg total) by mouth daily. Swallow whole. 30 tablet 12   atorvastatin (LIPITOR) 40 MG tablet Take 1 tablet (40 mg total) by mouth daily. 90 tablet 3   blood glucose meter kit and supplies Dispense based on patient and insurance preference. Use up to four times daily as directed. (FOR ICD-10  E10.9, E11.9). 1 each 0   clopidogrel (PLAVIX) 75 MG tablet Take 1 tablet (75 mg total) by mouth daily with breakfast. 30 tablet 3   Continuous Glucose Sensor (DEXCOM G7 SENSOR) MISC 1 Units by Does not apply route See admin instructions. Change sensor after 10 days 3 each 2   EPINEPHrine 0.3 mg/0.3 mL IJ SOAJ injection Inject 0.3 mg into the muscle once as needed for up to 1 dose (if patient exhibits significant signs and symptoms of allergic reaction.). 1 each 0   gabapentin (NEURONTIN) 300 MG capsule Take 1 capsule (300 mg total) by mouth 3 (three) times daily  as needed. (Patient taking differently: Take 300 mg by mouth 3 (three) times daily as needed (Neuropathy).) 180 capsule 1   olmesartan (BENICAR) 20 MG tablet Take 1 tablet (20 mg total) by mouth daily. 30 tablet 2   oxyCODONE (ROXICODONE) 5 MG immediate release tablet Take 1 tablet (5 mg total) by mouth every 6 (six) hours as needed. 20 tablet 0   pantoprazole (PROTONIX) 40 MG tablet Take 1 tablet (40 mg total) by mouth daily. 30 tablet 0   tirzepatide (MOUNJARO) 5 MG/0.5ML Pen Inject 5 mg into the skin once a week. 6 mL 0   zinc sulfate 220 (50 Zn) MG capsule Take 1 capsule (220 mg total) by mouth daily. 30 capsule 0   insulin NPH-regular Human (NOVOLIN 70/30) (70-30) 100 UNIT/ML injection Inject 10 Units into the skin 2 (two) times daily with a meal. (Patient taking differently: Inject 3 Units into the skin 3 (three) times daily with meals.) 10 mL 1   Insulin Syringes, Disposable, U-100 0.3 ML MISC 10 Units by Does not apply route 2 (two) times daily with breakfast and lunch. 60 each 0   No facility-administered medications prior to visit.    No Known Allergies  ROS Review of Systems  Constitutional:  Positive for fatigue. Negative for chills and fever.  HENT:  Negative for congestion and sore throat.   Eyes:  Negative for pain and discharge.  Respiratory:  Negative for cough and shortness of breath.   Cardiovascular:  Negative for  chest pain and palpitations.  Gastrointestinal:  Negative for diarrhea, nausea and vomiting.  Endocrine: Negative for polydipsia and polyuria.  Genitourinary:  Negative for dysuria and hematuria.  Musculoskeletal:  Negative for neck pain and neck stiffness.  Skin:  Positive for wound (right hand). Negative for rash.  Neurological:  Negative for dizziness, weakness and numbness.  Psychiatric/Behavioral:  Negative for agitation and behavioral problems.       Objective:    Physical Exam Vitals reviewed.  Constitutional:      General: He is not in acute distress.    Appearance: He is not diaphoretic.     Comments: In wheelchair  HENT:     Head: Normocephalic and atraumatic.     Mouth/Throat:     Mouth: Mucous membranes are moist.  Eyes:     General: No scleral icterus.    Extraocular Movements: Extraocular movements intact.  Cardiovascular:     Rate and Rhythm: Normal rate and regular rhythm.     Heart sounds: Normal heart sounds. No murmur heard. Pulmonary:     Breath sounds: Normal breath sounds. No wheezing or rales.  Musculoskeletal:     Cervical back: Neck supple. No tenderness.     Comments: S/p left BKA  Skin:    General: Skin is warm.     Comments: Laceration of left thumb, about 2 cm in length - no bleeding or discharge  Neurological:     General: No focal deficit present.     Mental Status: He is alert and oriented to person, place, and time.     Sensory: No sensory deficit.     Motor: No weakness.  Psychiatric:        Mood and Affect: Mood normal.        Behavior: Behavior normal.     BP 119/71 (BP Location: Right Arm, Patient Position: Sitting, Cuff Size: Normal)   Pulse 76   Ht 5\' 11"  (1.803 m)   SpO2 98%   BMI 26.07 kg/m  Wt Readings from Last 3 Encounters:  10/04/22 186 lb 14.4 oz (84.8 kg)  09/25/22 189 lb 9.5 oz (86 kg)  09/04/22 196 lb (88.9 kg)    Lab Results  Component Value Date   TSH 0.757 09/14/2022   Lab Results  Component Value  Date   WBC 14.3 (H) 10/04/2022   HGB 10.8 (L) 10/04/2022   HCT 33.8 (L) 10/04/2022   MCV 82.6 10/04/2022   PLT 377 10/04/2022   Lab Results  Component Value Date   NA 132 (L) 10/04/2022   K 5.0 10/04/2022   CO2 29 10/04/2022   GLUCOSE 241 (H) 10/04/2022   BUN 26 (H) 10/04/2022   CREATININE 0.71 10/04/2022   BILITOT 0.5 10/04/2022   ALKPHOS 77 09/21/2022   AST 13 10/04/2022   ALT 17 10/04/2022   PROT 7.2 10/04/2022   ALBUMIN <1.5 (L) 09/21/2022   CALCIUM 9.1 10/04/2022   ANIONGAP 7 09/21/2022   EGFR 95 08/16/2022   No results found for: "CHOL" No results found for: "HDL" No results found for: "LDLCALC" No results found for: "TRIG" No results found for: "CHOLHDL" Lab Results  Component Value Date   HGBA1C 9.8 (H) 09/16/2022      Assessment & Plan:   Problem List Items Addressed This Visit       Endocrine   Uncontrolled type 2 diabetes mellitus with hyperglycemia (HCC) - Primary    Lab Results  Component Value Date   HGBA1C 9.8 (H) 09/16/2022   Uncontrolled, but improving On Novolin N, but as he takes it randomly, discontinue it - started Glipizide 5 mg BID On Mounjaro 5 mg qw currently Advised to contact if blood glucose less than 70 or more than 300 Advised to follow diabetic diet F/u CMP and lipid panel Diabetic eye exam: Advised to follow up with Ophthalmology for diabetic eye exam      Relevant Medications   glipiZIDE (GLUCOTROL) 5 MG tablet   Other Relevant Orders   Urine Microalbumin w/creat. ratio   CMP14+EGFR   Hemoglobin A1c     Other   S/P BKA (below knee amputation) unilateral, left (HCC)    Due to nonhealing ulcer/osteomyelitis of foot and PAD Followed by wound clinic      Mixed hyperlipidemia    On Lipitor Check lipid profile      Relevant Orders   Lipid Profile   Injury of left hand    Due to cut injury Healing well Up-to-date with Tdap vaccine Keep area clean and dry      Other Visit Diagnoses     Refused influenza  vaccine           Meds ordered this encounter  Medications   glipiZIDE (GLUCOTROL) 5 MG tablet    Sig: Take 1 tablet (5 mg total) by mouth 2 (two) times daily before a meal.    Dispense:  60 tablet    Refill:  3    Follow-up: Return in about 2 months (around 01/07/2023).    Anabel Halon, MD

## 2022-11-07 NOTE — Assessment & Plan Note (Signed)
Due to nonhealing ulcer/osteomyelitis of foot and PAD Followed by wound clinic

## 2022-11-08 ENCOUNTER — Encounter: Payer: Self-pay | Admitting: Family

## 2022-11-08 DIAGNOSIS — Z7901 Long term (current) use of anticoagulants: Secondary | ICD-10-CM | POA: Diagnosis not present

## 2022-11-08 DIAGNOSIS — Z89512 Acquired absence of left leg below knee: Secondary | ICD-10-CM | POA: Diagnosis not present

## 2022-11-08 DIAGNOSIS — Z87891 Personal history of nicotine dependence: Secondary | ICD-10-CM | POA: Diagnosis not present

## 2022-11-08 DIAGNOSIS — E871 Hypo-osmolality and hyponatremia: Secondary | ICD-10-CM | POA: Diagnosis not present

## 2022-11-08 DIAGNOSIS — Z993 Dependence on wheelchair: Secondary | ICD-10-CM | POA: Diagnosis not present

## 2022-11-08 DIAGNOSIS — Z7985 Long-term (current) use of injectable non-insulin antidiabetic drugs: Secondary | ICD-10-CM | POA: Diagnosis not present

## 2022-11-08 DIAGNOSIS — E43 Unspecified severe protein-calorie malnutrition: Secondary | ICD-10-CM | POA: Diagnosis not present

## 2022-11-08 DIAGNOSIS — Z4781 Encounter for orthopedic aftercare following surgical amputation: Secondary | ICD-10-CM | POA: Diagnosis not present

## 2022-11-08 DIAGNOSIS — D649 Anemia, unspecified: Secondary | ICD-10-CM | POA: Diagnosis not present

## 2022-11-08 DIAGNOSIS — Z7982 Long term (current) use of aspirin: Secondary | ICD-10-CM | POA: Diagnosis not present

## 2022-11-08 DIAGNOSIS — E1151 Type 2 diabetes mellitus with diabetic peripheral angiopathy without gangrene: Secondary | ICD-10-CM | POA: Diagnosis not present

## 2022-11-08 DIAGNOSIS — I10 Essential (primary) hypertension: Secondary | ICD-10-CM | POA: Diagnosis not present

## 2022-11-08 DIAGNOSIS — Z794 Long term (current) use of insulin: Secondary | ICD-10-CM | POA: Diagnosis not present

## 2022-11-08 DIAGNOSIS — K219 Gastro-esophageal reflux disease without esophagitis: Secondary | ICD-10-CM | POA: Diagnosis not present

## 2022-11-08 DIAGNOSIS — Z8673 Personal history of transient ischemic attack (TIA), and cerebral infarction without residual deficits: Secondary | ICD-10-CM | POA: Diagnosis not present

## 2022-11-08 DIAGNOSIS — E1142 Type 2 diabetes mellitus with diabetic polyneuropathy: Secondary | ICD-10-CM | POA: Diagnosis not present

## 2022-11-15 ENCOUNTER — Ambulatory Visit: Payer: Medicare Other | Admitting: Family

## 2022-11-15 DIAGNOSIS — E43 Unspecified severe protein-calorie malnutrition: Secondary | ICD-10-CM | POA: Diagnosis not present

## 2022-11-15 DIAGNOSIS — Z993 Dependence on wheelchair: Secondary | ICD-10-CM | POA: Diagnosis not present

## 2022-11-15 DIAGNOSIS — Z8673 Personal history of transient ischemic attack (TIA), and cerebral infarction without residual deficits: Secondary | ICD-10-CM | POA: Diagnosis not present

## 2022-11-15 DIAGNOSIS — E1151 Type 2 diabetes mellitus with diabetic peripheral angiopathy without gangrene: Secondary | ICD-10-CM | POA: Diagnosis not present

## 2022-11-15 DIAGNOSIS — I10 Essential (primary) hypertension: Secondary | ICD-10-CM | POA: Diagnosis not present

## 2022-11-15 DIAGNOSIS — Z794 Long term (current) use of insulin: Secondary | ICD-10-CM | POA: Diagnosis not present

## 2022-11-15 DIAGNOSIS — E871 Hypo-osmolality and hyponatremia: Secondary | ICD-10-CM | POA: Diagnosis not present

## 2022-11-15 DIAGNOSIS — K219 Gastro-esophageal reflux disease without esophagitis: Secondary | ICD-10-CM | POA: Diagnosis not present

## 2022-11-15 DIAGNOSIS — Z7982 Long term (current) use of aspirin: Secondary | ICD-10-CM | POA: Diagnosis not present

## 2022-11-15 DIAGNOSIS — Z89512 Acquired absence of left leg below knee: Secondary | ICD-10-CM | POA: Diagnosis not present

## 2022-11-15 DIAGNOSIS — Z7985 Long-term (current) use of injectable non-insulin antidiabetic drugs: Secondary | ICD-10-CM | POA: Diagnosis not present

## 2022-11-15 DIAGNOSIS — Z87891 Personal history of nicotine dependence: Secondary | ICD-10-CM | POA: Diagnosis not present

## 2022-11-15 DIAGNOSIS — E1142 Type 2 diabetes mellitus with diabetic polyneuropathy: Secondary | ICD-10-CM | POA: Diagnosis not present

## 2022-11-15 DIAGNOSIS — Z4781 Encounter for orthopedic aftercare following surgical amputation: Secondary | ICD-10-CM | POA: Diagnosis not present

## 2022-11-15 DIAGNOSIS — D649 Anemia, unspecified: Secondary | ICD-10-CM | POA: Diagnosis not present

## 2022-11-15 DIAGNOSIS — Z7901 Long term (current) use of anticoagulants: Secondary | ICD-10-CM | POA: Diagnosis not present

## 2022-11-16 ENCOUNTER — Ambulatory Visit: Payer: Medicare Other | Admitting: Infectious Diseases

## 2022-11-17 ENCOUNTER — Ambulatory Visit (INDEPENDENT_AMBULATORY_CARE_PROVIDER_SITE_OTHER): Payer: Medicare Other | Admitting: Family

## 2022-11-17 DIAGNOSIS — Z89512 Acquired absence of left leg below knee: Secondary | ICD-10-CM

## 2022-11-21 ENCOUNTER — Encounter: Payer: Self-pay | Admitting: Family

## 2022-11-21 NOTE — Progress Notes (Signed)
Post-Op Visit Note   Patient: Grant Yang           Date of Birth: 1955/06/07           MRN: [id number] Visit Date: 11/17/2022 PCP: Billie Lade, MD  Chief Complaint:  Chief Complaint  Patient presents with   Left Leg - Routine Post Op    09/17/2022 left BKA     HPI:  HPI The patient is a 67 year old gentleman seen status post left below-knee amputation.  He has 1 remaining open area to the lateral end of his incision this is pinpoint Ortho Exam On examination left residual limb there is 1 remaining open area laterally this was debrided of eschar there is exposed absorbable suture material this was harvested with a 10 blade knife patient tolerated well he will continue daily dose of cleansing dry dressings proceed with prosthesis set up  Visit Diagnoses: No diagnosis found.  Plan:   Follow-Up Instructions: Return in about 2 months (around 01/17/2023).   Imaging: No results found.  Orders:  No orders of the defined types were placed in this encounter.  No orders of the defined types were placed in this encounter.    PMFS History: Patient Active Problem List   Diagnosis Date Noted   S/P BKA (below knee amputation) unilateral, left (HCC) 11/07/2022   Mixed hyperlipidemia 11/07/2022   Injury of left hand 11/07/2022   Severe protein-calorie malnutrition (HCC) 09/14/2022   Osteomyelitis of left lower extremity (HCC) 09/13/2022   Severe sepsis (HCC) 09/13/2022   Penetrating foot wound, left, initial encounter 09/13/2022   AKI (acute kidney injury) (HCC) 09/13/2022   High anion gap metabolic acidosis 09/13/2022   Dehydration 09/13/2022   Acute prerenal azotemia 09/13/2022   DM2 (diabetes mellitus, type 2) (HCC) 09/13/2022   Osteomyelitis of foot, left, acute (HCC) 09/04/2022   PAD (peripheral artery disease) (HCC) 08/16/2022   Hospital discharge follow-up 08/16/2022   Foot ulcer, left (HCC) 07/30/2022   History of CVA (cerebrovascular accident) 07/05/2022    Medication management 06/27/2022   Acute osteomyelitis of left foot (HCC) 05/30/2022   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) 05/30/2022   Cellulitis of left leg 05/29/2022   COPD suggested by initial evaluation (HCC) 05/29/2022   Prostate cancer (HCC) 01/30/2017   Cellulitis of left hand 02/01/17 I and D 01/30/2017   Essential hypertension 01/30/2017   Uncontrolled type 2 diabetes mellitus with hyperglycemia (HCC) 01/30/2017   Acute hyponatremia 12/29/2010   Past Medical History:  Diagnosis Date   Cancer (HCC)    prostate cancer    Diabetes mellitus    GERD (gastroesophageal reflux disease)    History of kidney stones    Hypertension    Neuropathy in diabetes (HCC)    bilateral lower extremities    Shortness of breath    patient states it hurts to breath; this was when he use to smoke cigarettes    Stroke [facility]) 2010   "mini stroke" patient cant remember when; states it happened in w. [place] in 2010 . denies any residual deficits     Family History  Problem Relation Age of Onset   Heart failure Mother    Cancer Father     Past Surgical History:  Procedure Laterality Date   ABDOMINAL AORTOGRAM W/LOWER EXTREMITY N/A 07/31/2022   Procedure: ABDOMINAL AORTOGRAM W/LOWER EXTREMITY;  Surgeon: Leonie Douglas, MD;  Location: [facility];  Service: Cardiovascular;  Laterality: N/A;  AMPUTATION Left 05/31/2022   Procedure: LEFT 2ND TOE AMPUTATION;  Surgeon: Nadara Mustard, MD;  Location: [facility] OR;  Service: Orthopedics;  Laterality: Left;   AMPUTATION Left 09/17/2022   Procedure: AMPUTATION BELOW KNEE;  Surgeon: Nadara Mustard, MD;  Location: [facility] OR;  Service: Orthopedics;  Laterality: Left;   APPENDECTOMY     APPLICATION OF WOUND VAC Left 09/17/2022   Procedure: APPLICATION OF WOUND VAC;  Surgeon: Nadara Mustard, MD;  Location: [facility] OR;  Service: Orthopedics;  Laterality: Left;   CHOLECYSTECTOMY  12/30/2010   Procedure: LAPAROSCOPIC CHOLECYSTECTOMY;   Surgeon: Dalia Heading;  Location: [facility]S;  Service: General;  Laterality: N/A;   I & D EXTREMITY Left 05/31/2022   Procedure: LEFT FOOT DEBRIDEMENT;  Surgeon: Nadara Mustard, MD;  Location: [facility] OR;  Service: Orthopedics;  Laterality: Left;   INCISION AND DRAINAGE ABSCESS Left 02/01/2017   Procedure: INCISION AND DRAINAGE ABSCESS LEFT HAND;  Surgeon: Vickki Hearing, MD;  Location: [facility];  Service: Orthopedics;  Laterality: Left;   LOWER EXTREMITY ANGIOGRAPHY N/A 08/03/2022   Procedure: Lower Extremity Angiography;  Surgeon: Cephus Shelling, MD;  Location: [facility] INVASIVE CV LAB;  Service: Cardiovascular;  Laterality: N/A;   LYMPHADENECTOMY Bilateral 08/02/2017   Procedure: LYMPHADENECTOMY, PELVIC;  Surgeon: Heloise Purpura, MD;  Location: [facility];  Service: Urology;  Laterality: Bilateral;   PERIPHERAL VASCULAR BALLOON ANGIOPLASTY  08/03/2022   Procedure: PERIPHERAL VASCULAR BALLOON ANGIOPLASTY;  Surgeon: Cephus Shelling, MD;  Location: [facility];  Service: Cardiovascular;;   ROBOT ASSISTED LAPAROSCOPIC RADICAL PROSTATECTOMY N/A 08/02/2017   Procedure: XI ROBOTIC ASSISTED LAPAROSCOPIC RADICAL PROSTATECTOMY LEVEL 2;  Surgeon: Heloise Purpura, MD;  Location: [facility];  Service: Urology;  Laterality: N/A;   Social History   Occupational History   Not on file  Tobacco Use   Smoking status: Former    Current packs/day: 0.00    Average packs/day: 2.0 packs/day for 38.0 years (76.0 ttl pk-yrs)    Types: Cigarettes    Start date: 64    Quit date: 2010    Years since quitting: 14.7   Smokeless tobacco: Never  Substance and Sexual Activity   Alcohol use: No   Drug use: Yes    Types: Marijuana    Comment: occ   Sexual activity: Yes

## 2022-11-22 DIAGNOSIS — Z87891 Personal history of nicotine dependence: Secondary | ICD-10-CM | POA: Diagnosis not present

## 2022-11-22 DIAGNOSIS — E1151 Type 2 diabetes mellitus with diabetic peripheral angiopathy without gangrene: Secondary | ICD-10-CM | POA: Diagnosis not present

## 2022-11-22 DIAGNOSIS — E1142 Type 2 diabetes mellitus with diabetic polyneuropathy: Secondary | ICD-10-CM | POA: Diagnosis not present

## 2022-11-22 DIAGNOSIS — E871 Hypo-osmolality and hyponatremia: Secondary | ICD-10-CM | POA: Diagnosis not present

## 2022-11-22 DIAGNOSIS — K219 Gastro-esophageal reflux disease without esophagitis: Secondary | ICD-10-CM | POA: Diagnosis not present

## 2022-11-22 DIAGNOSIS — Z89512 Acquired absence of left leg below knee: Secondary | ICD-10-CM | POA: Diagnosis not present

## 2022-11-22 DIAGNOSIS — Z7982 Long term (current) use of aspirin: Secondary | ICD-10-CM | POA: Diagnosis not present

## 2022-11-22 DIAGNOSIS — Z7901 Long term (current) use of anticoagulants: Secondary | ICD-10-CM | POA: Diagnosis not present

## 2022-11-22 DIAGNOSIS — Z993 Dependence on wheelchair: Secondary | ICD-10-CM | POA: Diagnosis not present

## 2022-11-22 DIAGNOSIS — E43 Unspecified severe protein-calorie malnutrition: Secondary | ICD-10-CM | POA: Diagnosis not present

## 2022-11-22 DIAGNOSIS — Z794 Long term (current) use of insulin: Secondary | ICD-10-CM | POA: Diagnosis not present

## 2022-11-22 DIAGNOSIS — D649 Anemia, unspecified: Secondary | ICD-10-CM | POA: Diagnosis not present

## 2022-11-22 DIAGNOSIS — Z4781 Encounter for orthopedic aftercare following surgical amputation: Secondary | ICD-10-CM | POA: Diagnosis not present

## 2022-11-22 DIAGNOSIS — Z7985 Long-term (current) use of injectable non-insulin antidiabetic drugs: Secondary | ICD-10-CM | POA: Diagnosis not present

## 2022-11-22 DIAGNOSIS — Z8673 Personal history of transient ischemic attack (TIA), and cerebral infarction without residual deficits: Secondary | ICD-10-CM | POA: Diagnosis not present

## 2022-11-22 DIAGNOSIS — I10 Essential (primary) hypertension: Secondary | ICD-10-CM | POA: Diagnosis not present

## 2022-11-29 ENCOUNTER — Other Ambulatory Visit: Payer: Self-pay

## 2022-11-29 ENCOUNTER — Encounter: Payer: Self-pay | Admitting: Infectious Diseases

## 2022-11-29 ENCOUNTER — Ambulatory Visit: Payer: Medicare Other | Admitting: Infectious Diseases

## 2022-11-29 VITALS — BP 106/70 | HR 83

## 2022-11-29 DIAGNOSIS — Z89512 Acquired absence of left leg below knee: Secondary | ICD-10-CM | POA: Diagnosis not present

## 2022-11-29 DIAGNOSIS — Z79899 Other long term (current) drug therapy: Secondary | ICD-10-CM

## 2022-11-29 DIAGNOSIS — E1142 Type 2 diabetes mellitus with diabetic polyneuropathy: Secondary | ICD-10-CM

## 2022-11-29 NOTE — Progress Notes (Addendum)
Patient Active Problem List   Diagnosis Date Noted   S/P BKA (below knee amputation) unilateral, left (HCC) 11/07/2022   Mixed hyperlipidemia 11/07/2022   Injury of left hand 11/07/2022   Severe protein-calorie malnutrition (HCC) 09/14/2022   Osteomyelitis of left lower extremity (HCC) 09/13/2022   Severe sepsis (HCC) 09/13/2022   Penetrating foot wound, left, initial encounter 09/13/2022   AKI (acute kidney injury) (HCC) 09/13/2022   High anion gap metabolic acidosis 09/13/2022   Dehydration 09/13/2022   Acute prerenal azotemia 09/13/2022   DM2 (diabetes mellitus, type 2) (HCC) 09/13/2022   Osteomyelitis of foot, left, acute (HCC) 09/04/2022   PAD (peripheral artery disease) (HCC) 08/16/2022   Hospital discharge follow-up 08/16/2022   Foot ulcer, left (HCC) 07/30/2022   History of CVA (cerebrovascular accident) 07/05/2022   Medication management 06/27/2022   Acute osteomyelitis of left foot (HCC) 05/30/2022   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 (Patient not taking: Reported on 10/04/2022) 8 g 0   ascorbic acid (VITAMIN C) 1000 MG tablet Take 1 tablet (1,000 mg total) by mouth daily. 30 tablet 0   aspirin EC 81 MG tablet Take 1 tablet (81 mg total) by mouth daily. Swallow whole. 30 tablet 12   atorvastatin (LIPITOR) 40 MG tablet Take 1 tablet (40 mg total) by mouth daily.  90 tablet 3   blood glucose meter kit and supplies Dispense based on patient and insurance preference. Use up to four times daily as directed. (FOR ICD-10 E10.9, E11.9). 1 each 0   clopidogrel (PLAVIX) 75 MG tablet Take 1 tablet (75 mg total) by mouth daily with breakfast. 30 tablet 3   Continuous Glucose Sensor (DEXCOM G7 SENSOR) MISC 1 Units by Does not apply route See admin instructions. Change sensor after 10 days 3 each 2   EPINEPHrine 0.3 mg/0.3 mL IJ SOAJ injection Inject 0.3 mg into the muscle once as needed for up to 1 dose (if patient exhibits significant signs and symptoms of allergic reaction.). 1 each 0   gabapentin (NEURONTIN) 300 MG capsule Take 1 capsule (300 mg total) by mouth 3 (three) times daily as needed. (Patient taking differently: Take 300 mg by mouth 3 (three) times daily as needed (Neuropathy).) 180 capsule 1   glipiZIDE (GLUCOTROL) 5 MG tablet Take 1 tablet (5 mg total) by mouth 2 (two) times daily before a meal. 60 tablet 3   olmesartan (BENICAR) 20 MG tablet Take 1 tablet (20 mg total) by mouth daily. 30 tablet 2   oxyCODONE (ROXICODONE) 5 MG immediate release tablet Take 1 tablet (5 mg total) by mouth every 6 (six) hours as needed. 20 tablet 0   pantoprazole (PROTONIX) 40 MG tablet Take 1 tablet (40 mg total) by mouth daily. 30 tablet 0   tirzepatide (MOUNJARO) 5 MG/0.5ML Pen Inject 5 mg into the skin once a week. 6 mL 0  zinc sulfate 220 (50 Zn) MG capsule Take 1 capsule (220 mg total) by mouth daily. 30 capsule 0   No current facility-administered medications on file prior to visit.   Subjective: 67 YO male with prior h/o prostate cancer s/p prostatectomy/lymphadenectomy, GERD, kidney stones, CVA, uncontrolled DM2 w peripheral neuropathy, PVD s/p vascular intervention in 08/03/2022, HTN s/p left 2nd toe amputation in 05/31/22 s/p prolonged PO abtx here for HFU after BKA for left DFU/abscess and osteomyelitis. 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  Patient had purulence that extended up to the  amputation site.  The muscle was healthy and viable no necrotic tissue or fascia. There is no infection proximal to the amputation site Tissue and purulence from the amputation site sent for cultures. Cx Proteus mirabilis. Per Dr Lajoyce Corners, no concerns for remaining osteomyelitis.Course also complicated with 2/2 bacteremia  Prevotella bivia/Proteus mirbailis. Patient was discharged on 8/5 with prolonged PO augmentin, EOT 10/14/22  10/04/22 - Taking PO augmentin. Left BKA wound healing well.   11/29/22 Completed course of PO augmentin as prescribed. Reports that the wound is 'doing great' and denies systemic symptoms such as fever, chills, nausea, and vomiting. He mentions a small area of the wound that was 'something hanging out' after he fell down a month ago and was cut off by  Francis Gaines NP last visit,  which bled minimally and appears to be healing well. I reviewed last Ortho note from 9/27. Next fu is in Dec 2024. Reports he will get a new prosthesis 10/18. Reports DM is managed with glipizide, mounjaro and his blood sugar was 118 before leaving home. He also mentions a minor injury to a rt 5th finger that occurred about three weeks ago when he cut it on a can lid. The finger is healing.   Review of Systems: all systems reviewed with pertinent positives and negatives as listed above  Past Medical History:  Diagnosis Date   Cancer (HCC)    prostate cancer    Diabetes mellitus    GERD (gastroesophageal reflux disease)    History of kidney stones    Hypertension    Neuropathy in diabetes [facility])    bilateral lower extremities    Shortness of breath    patient states it hurts to breath; this was when he use to smoke cigarettes    Stroke [facility]) 2010   "mini stroke" patient cant remember when; states it happened in w[place] in 2010 . denies any residual deficits    Past Surgical History:  Procedure Laterality Date   ABDOMINAL AORTOGRAM W/LOWER EXTREMITY N/A 07/31/2022   Procedure: ABDOMINAL  AORTOGRAM W/LOWER EXTREMITY;  Surgeon: Leonie Douglas, MD;  [facility] INVASIVE [facility] LAB;  Service: Cardiovascular;  Laterality: N/A;   AMPUTATION Left 05/31/2022   Procedure: LEFT 2ND TOE AMPUTATION;  Surgeon: Nadara Mustard, MD;  Location: [facility] OR;  Service: Orthopedics;  Laterality: Left;   AMPUTATION Left 09/17/2022   Procedure: AMPUTATION BELOW KNEE;  Surgeon: Nadara Mustard, MD;  Location: [facility] OR;  Service: Orthopedics;  Laterality: Left;   APPENDECTOMY     APPLICATION OF WOUND VAC Left 09/17/2022   Procedure: APPLICATION OF WOUND VAC;  Surgeon: Nadara Mustard, MD;  Location: [facility] OR;  Service: Orthopedics;  Laterality: Left;   CHOLECYSTECTOMY  12/30/2010   Procedure: LAPAROSCOPIC CHOLECYSTECTOMY;  Surgeon: Dalia Heading;  Location: [facility] ORS;  Service: General;  Laterality: N/A;   I & D EXTREMITY Left 05/31/2022   Procedure: LEFT FOOT DEBRIDEMENT;  Surgeon: Nadara Mustard,  MD;  Location: [facility] OR;  Service: Orthopedics;  Laterality: Left;   INCISION AND DRAINAGE ABSCESS Left 02/01/2017   Procedure: INCISION AND DRAINAGE ABSCESS LEFT HAND;  Surgeon: Vickki Hearing, MD;  Location: [facility] ORS;  Service: Orthopedics;  Laterality: Left;   LOWER EXTREMITY ANGIOGRAPHY N/A 08/03/2022   Procedure: Lower Extremity Angiography;  Surgeon: Cephus Shelling, MD;  Location: [facility] INVASIVE CV LAB;  Service: Cardiovascular;  Laterality: N/A;   LYMPHADENECTOMY Bilateral 08/02/2017   Procedure: LYMPHADENECTOMY, PELVIC;  Surgeon: Heloise Purpura, MD;  Location: [facility] ORS;  Service: Urology;  Laterality: Bilateral;   PERIPHERAL VASCULAR BALLOON ANGIOPLASTY  08/03/2022   Procedure: PERIPHERAL VASCULAR BALLOON ANGIOPLASTY;  Surgeon: Cephus Shelling, MD;  [facility];  Service: Cardiovascular;;   ROBOT ASSISTED LAPAROSCOPIC RADICAL PROSTATECTOMY N/A 08/02/2017   Procedure: XI ROBOTIC ASSISTED LAPAROSCOPIC RADICAL PROSTATECTOMY LEVEL 2;  Surgeon: Heloise Purpura, MD;  Location: [facility] ORS;  Service: Urology;   Laterality: N/A;     Social History   Tobacco Use   Smoking status: Former    Current packs/day: 0.00    Average packs/day: 2.0 packs/day for 38.0 years (76.0 ttl pk-yrs)    Types: Cigarettes    Start date: 63    Quit date: 2010    Years since quitting: 14.7   Smokeless tobacco: Never  Substance Use Topics   Alcohol use: No   Drug use: Yes    Types: Marijuana    Comment: occ    Family History  Problem Relation Age of Onset   Heart failure Mother    Cancer Father     No Known Allergies   Health Maintenance  Topic Date Due   Medicare Annual Wellness (AWV)  Never done   COVID-19 Vaccine (1) Never done   FOOT EXAM  Never done   Diabetic kidney evaluation - Urine ACR  Never done   Hepatitis C Screening  Never done   Zoster Vaccines- Shingrix (1 of 2) Never done   Colonoscopy  Never done   Lung Cancer Screening  Never done   Pneumonia Vaccine 34+ Years old (2 of 2 - PCV) 08/28/2022   INFLUENZA VACCINE  05/21/2023 (Originally 09/21/2022)   HEMOGLOBIN A1C  03/19/2023   Diabetic kidney evaluation - eGFR measurement  10/04/2023   OPHTHALMOLOGY EXAM  10/11/2023   DTaP/Tdap/Td (3 - Td or Tdap) 08/31/2031   HIV Screening  Completed   HPV VACCINES  Aged Out    Objective: BP 106/70   Pulse 83    Physical Exam Constitutional:      Appearance: Normal appearance.  HENT:     Head: Normocephalic and atraumatic.      Mouth: Mucous membranes are moist.  Eyes:    Conjunctiva/sclera: Conjunctivae normal.     Pupils:    Cardiovascular:     Rate and Rhythm: Normal rate and regular rhythm.     Heart sounds:   Pulmonary:     Effort: Pulmonary effort is normal.     Breath sounds  Abdominal:     General: Non distended     Palpations: soft.   Musculoskeletal:        General: Normal range of motion. Left BKA wound has healed with no signs of infection      Rt 5th finger - no cellulitis or open wound, no signs of infection   Skin:    General: Skin is warm and  dry.     Comments:  Neurological:     General:  grossly non focal     Mental Status: awake, alert and oriented to person, place, and time.   Psychiatric:        Mood and Affect: Mood normal.   Lab Results Lab Results  Component Value Date   WBC 14.3 (H) 10/04/2022   HGB 10.8 (L) 10/04/2022   HCT 33.8 (L) 10/04/2022   MCV 82.6 10/04/2022   PLT 377 10/04/2022    Lab Results  Component Value Date   CREATININE 0.71 10/04/2022   BUN 26 (H) 10/04/2022   NA 132 (L) 10/04/2022   K 5.0 10/04/2022   CL 96 (L) 10/04/2022   CO2 29 10/04/2022    Lab Results  Component Value Date   ALT 17 10/04/2022   AST 13 10/04/2022   ALKPHOS 77 09/21/2022   BILITOT 0.5 10/04/2022    No results found for: "CHOL", "HDL", "LDLCALC", "LDLDIRECT", "TRIG", "CHOLHDL" No results found for: "LABRPR", "RPRTITER" No results found for: "HIV1RNAQUANT", "HIV1RNAVL", "CD4TABS"  Assessment/Plan 67 YO male with prior h/o prostate cancer s/p prostatectomy/lymphadenectomy, GERD, kidney stones, CVA, uncontrolled DM2 w peripheral neuropathy, PVD s/p vascular intervention in 08/03/2022, HTN s/p left 2nd toe amputation in 05/31/22 s/p prolonged PO abtx with   # Left DFU/abscess and osteomyelitis # Prevotella bivia/Proteus mirbailis bacteremia 2/2 above  -  7/28 S/p BKA.  Patient had purulence that extended up to the amputation site.  The muscle was healthy and viable no necrotic tissue or fascia. There is no infection proximal to the amputation site Tissue and purulence from the amputation site sent for cultures. Cx Proteus mirabilis. Per Dr Lajoyce Corners, no concerns for remaining osteomyelitis - Completed course of PO augmentin, EOT 10/14/22 - Left BKA wound has healed with no signs of infection on exam  Plan  - Fu with Ortho as instructed  - Fu with [place] as needed   # DM  - Discussed optimal BG control   I have personally spent 30 minutes involved in face-to-face and non-face-to-face activities for this patient on the day  of the visit. Professional time spent includes the following activities: Preparing to see the patient (review of tests), Obtaining and/or reviewing separately obtained history (admission/discharge record), Performing a medically appropriate examination and/or evaluation , Ordering medications/tests/procedures, referring and communicating with other health care professionals, Documenting clinical information in the EMR, Independently interpreting results (not separately reported), Communicating results to the patient/family/caregiver, Counseling and educating the patient/family/caregiver and Care coordination (not separately reported).   Victoriano Lain, MD [facility] 11/29/2022, 3:25 PM

## 2022-12-08 DIAGNOSIS — Z89512 Acquired absence of left leg below knee: Secondary | ICD-10-CM | POA: Diagnosis not present

## 2022-12-15 ENCOUNTER — Other Ambulatory Visit (HOSPITAL_BASED_OUTPATIENT_CLINIC_OR_DEPARTMENT_OTHER): Payer: Self-pay

## 2022-12-24 ENCOUNTER — Other Ambulatory Visit: Payer: Self-pay | Admitting: Internal Medicine

## 2023-01-08 ENCOUNTER — Ambulatory Visit: Payer: Medicare Other | Admitting: Internal Medicine

## 2023-01-10 ENCOUNTER — Other Ambulatory Visit: Payer: Self-pay | Admitting: Internal Medicine

## 2023-01-10 DIAGNOSIS — E1165 Type 2 diabetes mellitus with hyperglycemia: Secondary | ICD-10-CM

## 2023-01-11 ENCOUNTER — Ambulatory Visit (INDEPENDENT_AMBULATORY_CARE_PROVIDER_SITE_OTHER): Payer: Medicare Other | Admitting: Internal Medicine

## 2023-01-11 ENCOUNTER — Encounter: Payer: Self-pay | Admitting: Internal Medicine

## 2023-01-11 VITALS — BP 129/81 | HR 66 | Ht 71.0 in | Wt 195.0 lb

## 2023-01-11 DIAGNOSIS — E1165 Type 2 diabetes mellitus with hyperglycemia: Secondary | ICD-10-CM

## 2023-01-11 DIAGNOSIS — I739 Peripheral vascular disease, unspecified: Secondary | ICD-10-CM | POA: Diagnosis not present

## 2023-01-11 DIAGNOSIS — Z7984 Long term (current) use of oral hypoglycemic drugs: Secondary | ICD-10-CM

## 2023-01-11 DIAGNOSIS — I10 Essential (primary) hypertension: Secondary | ICD-10-CM | POA: Diagnosis not present

## 2023-01-11 DIAGNOSIS — E782 Mixed hyperlipidemia: Secondary | ICD-10-CM | POA: Diagnosis not present

## 2023-01-11 DIAGNOSIS — Z1211 Encounter for screening for malignant neoplasm of colon: Secondary | ICD-10-CM

## 2023-01-11 MED ORDER — OZEMPIC (0.25 OR 0.5 MG/DOSE) 2 MG/3ML ~~LOC~~ SOPN
0.2500 mg | PEN_INJECTOR | SUBCUTANEOUS | 0 refills | Status: AC
Start: 2023-01-11 — End: 2023-02-08

## 2023-01-11 NOTE — Progress Notes (Signed)
Established Patient Office Visit  Subjective   Patient ID: KAESIN EMERICK, male    DOB: 1955/10/10  Age: 67 y.o. MRN: [id number]  Chief Complaint  Patient presents with   Diabetes    Follow up    Mr. Cokley returns to care today for routine follow-up.  He was last evaluated at [facility] on 9/17 by Dr. Allena Katz.  Glipizide 5 mg twice daily was added to his diabetes regimen at that time.  45-month follow-up was arranged.  In the interim, he has been seen by orthopedic surgery and infectious disease for follow-up.  There have otherwise been no acute interval events. Mr. Fouche reports feeling well today.  He is asymptomatic and has no acute concerns to discuss.  Past Medical History:  Diagnosis Date   Cancer [facility])    prostate cancer    Diabetes mellitus    GERD (gastroesophageal reflux disease)    History of kidney stones    Hypertension    Neuropathy in diabetes [facility])    bilateral lower extremities    Shortness of breath    patient states it hurts to breath; this was when he use to smoke cigarettes    Stroke [facility]) 2010   "mini stroke" patient cant remember when; states it happened in w[place] in 2010 . denies any residual deficits    Past Surgical History:  Procedure Laterality Date   ABDOMINAL AORTOGRAM W/LOWER EXTREMITY N/A 07/31/2022   Procedure: ABDOMINAL AORTOGRAM W/LOWER EXTREMITY;  Surgeon: Leonie Douglas, MD;  [facility]: [facility] INVASIVE CV LAB;  Service: Cardiovascular;  Laterality: N/A;   AMPUTATION Left 05/31/2022   Procedure: LEFT 2ND TOE AMPUTATION;  Surgeon: Nadara Mustard, MD;  Location: [facility] OR;  Service: Orthopedics;  Laterality: Left;   AMPUTATION Left 09/17/2022   Procedure: AMPUTATION BELOW KNEE;  Surgeon: Nadara Mustard, MD;  Location: [facility] OR;  Service: Orthopedics;  Laterality: Left;   APPENDECTOMY     APPLICATION OF WOUND VAC Left 09/17/2022   Procedure: APPLICATION OF WOUND VAC;  Surgeon: Nadara Mustard, MD;  Location: [facility] OR;  Service: Orthopedics;  Laterality: Left;    CHOLECYSTECTOMY  12/30/2010   Procedure: LAPAROSCOPIC CHOLECYSTECTOMY;  Surgeon: Dalia Heading;  Location: [facility] ORS;  Service: General;  Laterality: N/A;   I & D EXTREMITY Left 05/31/2022   Procedure: LEFT FOOT DEBRIDEMENT;  Surgeon: Nadara Mustard, MD;  Location: [facility] OR;  Service: Orthopedics;  Laterality: Left;   INCISION AND DRAINAGE ABSCESS Left 02/01/2017   Procedure: INCISION AND DRAINAGE ABSCESS LEFT HAND;  Surgeon: Vickki Hearing, MD;  Location: [facility] ORS;  Service: Orthopedics;  Laterality: Left;   LOWER EXTREMITY ANGIOGRAPHY N/A 08/03/2022   Procedure: Lower Extremity Angiography;  Surgeon: Cephus Shelling, MD;  Location: [facility] INVASIVE CV LAB;  Service: Cardiovascular;  Laterality: N/A;   LYMPHADENECTOMY Bilateral 08/02/2017   Procedure: LYMPHADENECTOMY, PELVIC;  Surgeon: Heloise Purpura, MD;  Location: [facility] ORS;  Service: Urology;  Laterality: Bilateral;   PERIPHERAL VASCULAR BALLOON ANGIOPLASTY  08/03/2022   Procedure: PERIPHERAL VASCULAR BALLOON ANGIOPLASTY;  Surgeon: Cephus Shelling, MD;  [facility] INVAS[facility] CV LAB;  Service: Cardiovascular;;   ROBOT ASSISTED LAPAROSCOPIC RADICAL PROSTATECTOMY N/A 08/02/2017   Procedure: XI ROBOTIC ASSISTED LAPAROSCOPIC RADICAL PROSTATECTOMY LEVEL 2;  Surgeon: Heloise Purpura, MD;  Location: [facility] ORS;  Service: Urology;  Laterality: N/A;   Social History   Tobacco Use   Smoking status: Former    Current packs/day: 0.00    Average packs/day: 2.0 packs/day for  38.0 years (76.0 ttl pk-yrs)    Types: Cigarettes    Start date: 67    Quit date: 2010    Years since quitting: 14.8   Smokeless tobacco: Never  Substance Use Topics   Alcohol use: No   Drug use: Yes    Types: Marijuana    Comment: occ   Family History  Problem Relation Age of Onset   Heart failure Mother    Cancer Father    No Known Allergies  Review of Systems  Constitutional:  Negative for chills and fever.  HENT:  Negative for sore throat.   Respiratory:  Negative for  cough and shortness of breath.   Cardiovascular:  Negative for chest pain, palpitations and leg swelling.  Gastrointestinal:  Negative for abdominal pain, blood in stool, constipation, diarrhea, nausea and vomiting.  Genitourinary:  Negative for dysuria and hematuria.  Musculoskeletal:  Negative for myalgias.  Skin:  Negative for itching and rash.  Neurological:  Negative for dizziness and headaches.  Psychiatric/Behavioral:  Negative for depression and suicidal ideas.      Objective:     BP 129/81 (BP Location: Right Arm, Patient Position: Sitting, Cuff Size: Normal)   Pulse 66   Ht 5\' 11"  (1.803 m)   Wt 195 lb (88.5 kg)   SpO2 98%   BMI 27.20 kg/m  BP Readings from Last 3 Encounters:  01/11/23 129/81  11/29/22 106/70  11/07/22 119/71   Physical Exam Vitals reviewed.  Constitutional:      General: He is not in acute distress.    Appearance: Normal appearance. He is not ill-appearing.  HENT:     Head: Normocephalic and atraumatic.     Right Ear: External ear normal.     Left Ear: External ear normal.     Nose: Nose normal. No congestion or rhinorrhea.     Mouth/Throat:     Mouth: Mucous membranes are moist.     Pharynx: Oropharynx is clear.  Eyes:     General: No scleral icterus.    Extraocular Movements: Extraocular movements intact.     Conjunctiva/sclera: Conjunctivae normal.     Pupils: Pupils are equal, round, and reactive to light.  Cardiovascular:     Rate and Rhythm: Normal rate and regular rhythm.     Pulses: Normal pulses.     Heart sounds: Normal heart sounds. No murmur heard. Pulmonary:     Effort: Pulmonary effort is normal.     Breath sounds: Normal breath sounds. No wheezing, rhonchi or rales.  Abdominal:     General: Abdomen is flat. Bowel sounds are normal. There is no distension.     Palpations: Abdomen is soft.     Tenderness: There is no abdominal tenderness.  Musculoskeletal:        General: Deformity (Left BKA) present. No swelling.      Cervical back: Normal range of motion.  Skin:    General: Skin is warm and dry.     Capillary Refill: Capillary refill takes less than 2 seconds.  Neurological:     General: No focal deficit present.     Mental Status: He is alert and oriented to person, place, and time.     Motor: No weakness.  Psychiatric:        Mood and Affect: Mood normal.        Behavior: Behavior normal.        Thought Content: Thought content normal.   Last CBC Lab Results  Component Value Date  WBC 14.3 (H) 10/04/2022   HGB 10.8 (L) 10/04/2022   HCT 33.8 (L) 10/04/2022   MCV 82.6 10/04/2022   MCH 26.4 (L) 10/04/2022   RDW 15.2 (H) 10/04/2022   PLT 377 10/04/2022   Last metabolic panel Lab Results  Component Value Date   GLUCOSE 241 (H) 10/04/2022   NA 132 (L) 10/04/2022   K 5.0 10/04/2022   CL 96 (L) 10/04/2022   CO2 29 10/04/2022   BUN 26 (H) 10/04/2022   CREATININE 0.71 10/04/2022   GFRNONAA >60 09/21/2022   CALCIUM 9.1 10/04/2022   PHOS 2.9 09/19/2022   PROT 7.2 10/04/2022   ALBUMIN <1.5 (L) 09/21/2022   LABGLOB 3.2 08/16/2022   BILITOT 0.5 10/04/2022   ALKPHOS 77 09/21/2022   AST 13 10/04/2022   ALT 17 10/04/2022   ANIONGAP 7 09/21/2022   Last hemoglobin A1c Lab Results  Component Value Date   HGBA1C 9.8 (H) 09/16/2022   Last thyroid functions Lab Results  Component Value Date   TSH 0.757 09/14/2022   Last vitamin B12 and Folate Lab Results  Component Value Date   VITAMINB12 602 09/16/2022   FOLATE 6.8 09/16/2022     Assessment & Plan:   Problem List Items Addressed This Visit       Essential hypertension - Primary    Adequately controlled on current antihypertensive regimen.  No medication changes are indicated today.      Uncontrolled type 2 diabetes mellitus with hyperglycemia (HCC)    A1c 9.8 on labs from July.  Glipizide 5 mg twice daily was added in September.  He is additionally prescribed Mounjaro 5 mg weekly.  His concern today is that the cost of  Greggory Keen will be increasing and he is not sure he will be able to afford it. -Repeat A1c ordered today -Discontinue Mounjaro and start Ozempic 0.25 mg weekly.  Will have him apply for patient assistance      Mixed hyperlipidemia    Atorvastatin 40 mg daily was started in the setting of diabetes mellitus and a history of CVA. -Repeat lipid panel ordered today      Colon cancer screening    Cologuard ordered today       Return in about 3 months (around 04/13/2023).    Billie Lade, MD

## 2023-01-11 NOTE — Assessment & Plan Note (Signed)
Atorvastatin 40 mg daily was started in the setting of diabetes mellitus and a history of CVA. -Repeat lipid panel ordered today

## 2023-01-11 NOTE — Patient Instructions (Signed)
It was a pleasure to see you today.  Thank you for giving [place] the opportunity to be involved in your care.  Below is a brief recap of your visit and next steps.  We will plan to see you again in 3 months.  Summary Discontinue Mounjaro and start Ozempic. Will apply for patient assistance. Repeat labs today Follow up in 3 months

## 2023-01-11 NOTE — Assessment & Plan Note (Signed)
A1c 9.8 on labs from July.  Glipizide 5 mg twice daily was added in September.  He is additionally prescribed Mounjaro 5 mg weekly.  His concern today is that the cost of Greggory Keen will be increasing and he is not sure he will be able to afford it. -Repeat A1c ordered today -Discontinue Mounjaro and start Ozempic 0.25 mg weekly.  Will have him apply for patient assistance

## 2023-01-11 NOTE — Assessment & Plan Note (Signed)
Adequately controlled on current antihypertensive regimen.  No medication changes are indicated today.

## 2023-01-11 NOTE — Assessment & Plan Note (Signed)
Cologuard ordered today °

## 2023-01-12 LAB — CBC WITH DIFFERENTIAL/PLATELET
Basophils Absolute: 0.1 10*3/uL (ref 0.0–0.2)
Basos: 1 %
EOS (ABSOLUTE): 0.6 10*3/uL — ABNORMAL HIGH (ref 0.0–0.4)
Eos: 5 %
Hematocrit: 46.4 % (ref 37.5–51.0)
Hemoglobin: 15 g/dL (ref 13.0–17.7)
Immature Grans (Abs): 0 10*3/uL (ref 0.0–0.1)
Immature Granulocytes: 0 %
Lymphocytes Absolute: 2.8 10*3/uL (ref 0.7–3.1)
Lymphs: 21 %
MCH: 28.6 pg (ref 26.6–33.0)
MCHC: 32.3 g/dL (ref 31.5–35.7)
MCV: 88 fL (ref 79–97)
Monocytes Absolute: 1 10*3/uL — ABNORMAL HIGH (ref 0.1–0.9)
Monocytes: 7 %
Neutrophils Absolute: 9.2 10*3/uL — ABNORMAL HIGH (ref 1.4–7.0)
Neutrophils: 66 %
Platelets: 331 10*3/uL (ref 150–450)
RBC: 5.25 x10E6/uL (ref 4.14–5.80)
RDW: 14.5 % (ref 11.6–15.4)
WBC: 13.7 10*3/uL — ABNORMAL HIGH (ref 3.4–10.8)

## 2023-01-12 LAB — BASIC METABOLIC PANEL
BUN/Creatinine Ratio: 23 (ref 10–24)
BUN: 19 mg/dL (ref 8–27)
CO2: 22 mmol/L (ref 20–29)
Calcium: 9.8 mg/dL (ref 8.6–10.2)
Chloride: 100 mmol/L (ref 96–106)
Creatinine, Ser: 0.81 mg/dL (ref 0.76–1.27)
Glucose: 180 mg/dL — ABNORMAL HIGH (ref 70–99)
Potassium: 5 mmol/L (ref 3.5–5.2)
Sodium: 137 mmol/L (ref 134–144)
eGFR: 98 mL/min/{1.73_m2} (ref >59)

## 2023-01-12 LAB — LIPID PANEL
Chol/HDL Ratio: 3.2 ratio (ref 0.0–5.0)
Cholesterol, Total: 129 mg/dL (ref 100–199)
HDL: 40 mg/dL (ref >39)
LDL Chol Calc (NIH): 63 mg/dL (ref 0–99)
Triglycerides: 149 mg/dL (ref 0–149)
VLDL Cholesterol Cal: 26 mg/dL (ref 5–40)

## 2023-01-12 LAB — HEMOGLOBIN A1C
Est. average glucose Bld gHb Est-mCnc: 171 mg/dL
Hgb A1c MFr Bld: 7.6 % — ABNORMAL HIGH (ref 4.8–5.6)

## 2023-01-14 ENCOUNTER — Other Ambulatory Visit: Payer: Self-pay | Admitting: Internal Medicine

## 2023-01-22 ENCOUNTER — Ambulatory Visit: Payer: Self-pay | Admitting: Internal Medicine

## 2023-01-22 DIAGNOSIS — Z1211 Encounter for screening for malignant neoplasm of colon: Secondary | ICD-10-CM | POA: Diagnosis not present

## 2023-01-22 NOTE — Telephone Encounter (Signed)
Copied from CRM [id number]. Topic: Clinical - Pink Word Triage >> Jan 22, 2023  2:40 PM Chantha C wrote: Reason for Triage: Pt is currently on Semaglutide,0.25 or 0.5MG /DOS, (OZEMPIC, 0.25 OR 0.5 MG/DOSE,) 2 MG/3ML SOPN and noticed that his  blood sugar is around 300, right now 290.  Chief Complaint: High blood sugar Symptoms: Patient denies any symptoms Frequency: Patient states he just started Ozempic yesterday and blood sugar levels are normally around 140-180 but they were 277 while I was speaking with him Pertinent Negatives: Patient denies any physical symptoms--states he feels fine Disposition: [] ED /[] Urgent Care (no appt availability in office) / [] Appointment(In office/virtual)/ []  [facility]/ [x] Home Care/ [] Refused Recommended Disposition /[] [facility]/ []  Follow-up with PCP Additional Notes: Patient states that his blood sugar was 277 according to his continuous meter on his phone.  He states that he started Ozempic yesterday and didn't know if the medication just hasn't started working yet.  He advised that he felt fine and had no complaints physically but he just knows his numbers are supposed to be lower than this.  Patient states he just took his night time Glipizide pill and he would monitor his blood sugar after this. He also performed a finger stick glucose check to ensure his monitor reading on his phone was accurate.  This number was 280. Pt states he was staying hydrated. Patient was advised that since he was not having any symptoms, if he wanted an appointment at the office to discuss this new medication, I could schedule him an appointment.  Patient states that he doesn't know anything about checking his ketones. He also advised that since it had only been one day since starting this new medication Ozempic, he would give it another day and just see how he feels and how the numbers go.  He advises that he keeps a close eye on his readings and if anything  changes or worsens, he will get it checked out.  I advised him that if it remained high and/or he started having any symptoms for him to call 911, go to an ER, go to an urgent care, or give [place] a call to get him in to see a Provider.  Patient verbalizes understanding.   Reason for Disposition  [1] Blood glucose 240 - 300 mg/dL (04.5 - 40.9 mmol/L) AND [2] does not  use insulin (e.g., not insulin-dependent; most people with type 2 diabetes)  Answer Assessment - Initial Assessment Questions 1. BLOOD GLUCOSE: "What is your blood glucose level?"      277 and coming down  2. ONSET: "When did you check the blood glucose?"     Showing at 2:45pm 3. USUAL RANGE: "What is your glucose level usually?" (e.g., usual fasting morning value, usual evening value)     140-180 4. KETONES: "Do you check for ketones (urine or blood test strips)?" If Yes, ask: "What does the test show now?"      Patient doesn't have these 5. TYPE 1 or 2:  "Do you know what type of diabetes you have?"  (e.g., Type 1, Type 2, Gestational; doesn't know)      Type 2 6. INSULIN: "Do you take insulin?" "What type of insulin(s) do you use? What is the mode of delivery? (syringe, pen; injection or pump)?"      Pt states off of them now but he just started Ozempic yesterday and stopped Mounjaro 7. DIABETES PILLS: "Do you take any pills for your diabetes?" If  Yes, ask: "Have you missed taking any pills recently?"     Glipizide 8. OTHER SYMPTOMS: "Do you have any symptoms?" (e.g., fever, frequent urination, difficulty breathing, dizziness, weakness, vomiting)     Patient denies any questions  Protocols used: Diabetes - High Blood Sugar-A-AH

## 2023-01-27 LAB — COLOGUARD: COLOGUARD: NEGATIVE

## 2023-02-16 ENCOUNTER — Encounter: Payer: Self-pay | Admitting: Physician Assistant

## 2023-02-16 ENCOUNTER — Ambulatory Visit: Payer: Medicare Other | Admitting: Physician Assistant

## 2023-02-16 ENCOUNTER — Other Ambulatory Visit: Payer: Self-pay | Admitting: Internal Medicine

## 2023-02-16 DIAGNOSIS — Z89512 Acquired absence of left leg below knee: Secondary | ICD-10-CM

## 2023-02-16 DIAGNOSIS — E1165 Type 2 diabetes mellitus with hyperglycemia: Secondary | ICD-10-CM

## 2023-02-16 NOTE — Progress Notes (Signed)
Office Visit Note   Patient: Grant Yang           Date of Birth: 28-Jun-1955           MRN: [id number] Visit Date: 02/16/2023              Requested by: Billie Lade, MD [address] [place],  [place] [address] PCP: Billie Lade, MD  Chief Complaint  Patient presents with   Left Leg - Follow-up    09/17/2022 left BKA      HPI: Grant Yang is a pleasant 67 year old gentleman who is 5 months status post left below-knee amputation he has received his prosthetic and is doing well.  He said he has a follow-up appointment to check the fit on the prosthetic in April.  No complaints ambulating without difficulty  Assessment & Plan: Visit Diagnoses: 72-month status post left below-knee amputation, doing well.  Plan: He may follow-up as needed I did remove a small piece of Vicryl from the incision without difficulty his incision looks great  Follow-Up Instructions: No follow-ups on file.   Ortho Exam  Patient is alert, oriented, no adenopathy, well-dressed, normal affect, normal respiratory effort. Examination of his left BKA stump he is neurovascular intact he has good extension and flexion of his knee his incision is completely healed he does have a small Vicryl suture that is prominent.  There is no surrounding erythema no cellulitis prosthetic appears to be fitting well  Imaging: No results found. No images are attached to the encounter.  Labs: Lab Results  Component Value Date   HGBA1C 7.6 (H) 01/11/2023   HGBA1C 9.8 (H) 09/16/2022   HGBA1C 9.6 (H) 09/14/2022   ESRSEDRATE 104 (H) 10/04/2022   ESRSEDRATE >140 (H) 09/14/2022   ESRSEDRATE 86 (H) 07/30/2022   CRP 36.7 (H) 10/04/2022   CRP 25.9 (H) 09/14/2022   CRP 12.4 (H) 07/30/2022   REPTSTATUS 09/21/2022 FINAL 09/17/2022   GRAMSTAIN  09/17/2022    ABUNDANT GRAM NEGATIVE RODS ABUNDANT GRAM POSITIVE COCCI FEW GRAM POSITIVE RODS FEW WBC PRESENT, PREDOMINANTLY PMN    CULT  09/17/2022    MODERATE PROTEUS  MIRABILIS FEW ENTEROCOCCUS FAECALIS ABUNDANT PREVOTELLA BIVIA BETA LACTAMASE POSITIVE Performed at [facility], [address], [place], [place] [address]    LABORGA PROTEUS MIRABILIS 09/17/2022   LABORGA ENTEROCOCCUS FAECALIS 09/17/2022     Lab Results  Component Value Date   ALBUMIN <1.5 (L) 09/21/2022   ALBUMIN <1.5 (L) 09/19/2022   ALBUMIN <1.5 (L) 09/18/2022   PREALBUMIN <5 (L) 09/16/2022    Lab Results  Component Value Date   MG 2.0 09/19/2022   MG 2.1 09/18/2022   MG 2.0 09/17/2022   No results found for: "VD25OH"  Lab Results  Component Value Date   PREALBUMIN <5 (L) 09/16/2022      Latest Ref Rng & Units 01/11/2023   11:47 AM 10/04/2022   11:41 AM 09/21/2022    7:16 AM  CBC EXTENDED  WBC 3.4 - 10.8 x10E3/uL 13.7  14.3  14.4   RBC 4.14 - 5.80 x10E6/uL 5.25  4.09  3.26   Hemoglobin 13.0 - 17.7 g/dL 82.9  56.2  8.9   HCT 13.0 - 51.0 % 46.4  33.8  26.6   Platelets 150 - 450 x10E3/uL 331  377  729   NEUT# 1.4 - 7.0 x10E3/uL 9.2     Lymph# 0.7 - 3.1 x10E3/uL 2.8        There  is no height or weight on file to calculate BMI.  Orders:  No orders of the defined types were placed in this encounter.  No orders of the defined types were placed in this encounter.    Procedures: No procedures performed  Clinical Data: No additional findings.  ROS:  All other systems negative, except as noted in the HPI. Review of Systems  Objective: Vital Signs: There were no vitals taken for this visit.  Specialty Comments:  No specialty comments available.  PMFS History: Patient Active Problem List   Diagnosis Date Noted   Colon cancer screening 01/11/2023   S/P BKA (below knee amputation) unilateral, left (HCC) 11/07/2022   Mixed hyperlipidemia 11/07/2022   Injury of left hand 11/07/2022   Severe protein-calorie malnutrition (HCC) 09/14/2022   Osteomyelitis of left lower extremity (HCC) 09/13/2022   Severe sepsis (HCC) 09/13/2022   Penetrating foot  wound, left, initial encounter 09/13/2022   AKI (acute kidney injury) (HCC) 09/13/2022   High anion gap metabolic acidosis 09/13/2022   Dehydration 09/13/2022   Acute prerenal azotemia 09/13/2022   DM2 (diabetes mellitus, type 2) (HCC) 09/13/2022   PAD (peripheral artery disease) (HCC) 08/16/2022   Hospital discharge follow-up 08/16/2022   Foot ulcer, left (HCC) 07/30/2022   History of CVA (cerebrovascular accident) 07/05/2022   Medication management 06/27/2022   Acute osteomyelitis of left foot (HCC) 05/30/2022   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) 05/30/2022   Cellulitis of left leg 05/29/2022   COPD suggested by initial evaluation (HCC) 05/29/2022   Prostate cancer (HCC) 01/30/2017   Cellulitis of left hand 02/01/17 I and D 01/30/2017   Essential hypertension 01/30/2017   Uncontrolled type 2 diabetes mellitus with hyperglycemia (HCC) 01/30/2017   Acute hyponatremia 12/29/2010   Past Medical History:  Diagnosis Date   Cancer (HCC)    prostate cancer    Diabetes mellitus    GERD (gastroesophageal reflux disease)    History of kidney stones    Hypertension    Neuropathy in diabetes (HCC)    bilateral lower extremities    Shortness of breath    patient states it hurts to breath; this was when he use to smoke cigarettes    Stroke [facility]) 2010   "mini stroke" patient cant remember when; states it happened in [place] in [place] . denies any residual deficits     Family History  Problem Relation Age of Onset   Heart failure Mother    Cancer Father     Past Surgical History:  Procedure Laterality Date   ABDOMINAL AORTOGRAM W/LOWER EXTREMITY N/A 07/31/2022   Procedure: ABDOMINAL AORTOGRAM W/LOWER EXTREMITY;  Surgeon: Leonie Douglas, MD;  Location: [facility] INVASIVE CV LAB;  Service: Cardiovascular;  Laterality: N/A;   AMPUTATION Left 05/31/2022   Procedure: LEFT 2ND TOE AMPUTATION;  Surgeon: Nadara Mustard, MD;  Location: [facility] OR;  Service: Orthopedics;   Laterality: Left;   AMPUTATION Left 09/17/2022   Procedure: AMPUTATION BELOW KNEE;  Surgeon: Nadara Mustard, MD;  Location: [facility] OR;  Service: Orthopedics;  Laterality: Left;   APPENDECTOMY     APPLICATION OF WOUND VAC Left 09/17/2022   Procedure: APPLICATION OF WOUND VAC;  Surgeon: Nadara Mustard, MD;  Location: [facility] OR;  Service: Orthopedics;  Laterality: Left;   CHOLECYSTECTOMY  12/30/2010   Procedure: LAPAROSCOPIC CHOLECYSTECTOMY;  Surgeon: Dalia Heading;  Location: [facility] ORS;  Service: General;  Laterality: N/A;   I & D EXTREMITY Left 05/31/2022  Procedure: LEFT FOOT DEBRIDEMENT;  Surgeon: Nadara Mustard, MD;  Location: [facility] OR;  Service: Orthopedics;  Laterality: Left;   INCISION AND DRAINAGE ABSCESS Left 02/01/2017   Procedure: INCISION AND DRAINAGE ABSCESS LEFT HAND;  Surgeon: Vickki Hearing, MD;  Location: [facility];  Service: Orthopedics;  Laterality: Left;   LOWER EXTREMITY ANGIOGRAPHY N/A 08/03/2022   Procedure: Lower Extremity Angiography;  Surgeon: Cephus Shelling, MD;  Location: [facility] INVASIVE CV LAB;  Service: Cardiovascular;  Laterality: N/A;   LYMPHADENECTOMY Bilateral 08/02/2017   Procedure: LYMPHADENECTOMY, PELVIC;  Surgeon: Heloise Purpura, MD;  Location: [facility] ORS;  Service: Urology;  Laterality: Bilateral;   PERIPHERAL VASCULAR BALLOON ANGIOPLASTY  08/03/2022   Procedure: PERIPHERAL VASCULAR BALLOON ANGIOPLASTY;  Surgeon: Cephus Shelling, MD;  Location: [facility];  Service: Cardiovascular;;   ROBOT ASSISTED LAPAROSCOPIC RADICAL PROSTATECTOMY N/A 08/02/2017   Procedure: XI ROBOTIC ASSISTED LAPAROSCOPIC RADICAL PROSTATECTOMY LEVEL 2;  Surgeon: Heloise Purpura, MD;  Location: [facility]S;  Service: Urology;  Laterality: N/A;   Social History   Occupational History   Not on file  Tobacco Use   Smoking status: Former    Current packs/day: 0.00    Average packs/day: 2.0 packs/day for 38.0 years (76.0 ttl pk-yrs)    Types: Cigarettes    Start date: 37    Quit date: 2010     Years since quitting: 14.9   Smokeless tobacco: Never  Substance and Sexual Activity   Alcohol use: No   Drug use: Yes    Types: Marijuana    Comment: occ   Sexual activity: Yes

## 2023-02-16 NOTE — Telephone Encounter (Signed)
Copied from CRM [id number]. Topic: Clinical - Medication Refill >> Feb 16, 2023  3:08 PM Ivette P wrote: Most Recent Primary Care Visit:  Provider: Christel Mormon E  Department: [facility]  Visit Type: OFFICE VISIT  Date: 01/11/2023  Medication: gabapentin (NEURONTIN) 300 MG capsule  Has the patient contacted their pharmacy? No (Agent: If no, request that the patient contact the pharmacy for the refill. If patient does not wish to contact the pharmacy document the reason why and proceed with request.) (Agent: If yes, when and what did the pharmacy advise?)  Is this the correct pharmacy for this prescription? Yes If no, delete pharmacy and type the correct one.  This is the patient's preferred pharmacy:  [facility] [address] [place] - [address] [place] [address] [place] [address] Phone: [phone] Fax: [phone]     Has the prescription been filled recently? No  Is the patient out of the medication? No  Has the patient been seen for an appointment in the last year OR does the patient have an upcoming appointment? Yes  Can we respond through MyChart? Yes  Agent: Please be advised that Rx refills may take up to 3 business days. We ask that you follow-up with your pharmacy.

## 2023-02-19 ENCOUNTER — Other Ambulatory Visit: Payer: Self-pay

## 2023-02-19 ENCOUNTER — Other Ambulatory Visit: Payer: Self-pay | Admitting: Internal Medicine

## 2023-02-19 DIAGNOSIS — I10 Essential (primary) hypertension: Secondary | ICD-10-CM

## 2023-02-19 DIAGNOSIS — E1165 Type 2 diabetes mellitus with hyperglycemia: Secondary | ICD-10-CM

## 2023-02-19 MED ORDER — GABAPENTIN 300 MG PO CAPS: 300.0000 mg | ORAL_CAPSULE | Freq: Three times a day (TID) | ORAL | 1 refills | Status: DC | PRN

## 2023-02-21 ENCOUNTER — Other Ambulatory Visit: Payer: Self-pay | Admitting: Internal Medicine

## 2023-02-21 ENCOUNTER — Other Ambulatory Visit: Payer: Self-pay | Admitting: Family Medicine

## 2023-02-21 DIAGNOSIS — I10 Essential (primary) hypertension: Secondary | ICD-10-CM

## 2023-02-22 ENCOUNTER — Other Ambulatory Visit: Payer: Self-pay | Admitting: Internal Medicine

## 2023-02-22 DIAGNOSIS — E1165 Type 2 diabetes mellitus with hyperglycemia: Secondary | ICD-10-CM

## 2023-02-22 MED ORDER — OLMESARTAN MEDOXOMIL 20 MG PO TABS: 20.0000 mg | ORAL_TABLET | Freq: Every day | ORAL | 0 refills | Status: DC

## 2023-02-22 MED ORDER — BLOOD GLUCOSE METER KIT: PACK | 0 refills | Status: AC

## 2023-02-22 MED ORDER — DEXCOM G7 SENSOR MISC: 1.0000 | Freq: Once | 0 refills | Status: DC

## 2023-02-22 NOTE — Telephone Encounter (Signed)
 Copied from CRM [id number]. Topic: Clinical - Medication Refill >> Feb 22, 2023 11:08 AM Graeme ORN wrote: Most Recent Primary Care Visit:  Provider: [name], PHILLIP E  Department: [facility]  Visit Type: OFFICE VISIT  Date: 01/11/2023  Medication:   Has the patient contacted their pharmacy?  (Agent: If no, request that the patient contact the pharmacy for the refill. If patient does not wish to contact the pharmacy document the reason why and proceed with request.) (Agent: If yes, when and what did the pharmacy advise?)  Is this the correct pharmacy for this prescription?  If no, delete pharmacy and type the correct one.  This is the patient's preferred pharmacy:  [facility] [address], [place] [address] [place] [address] [address] [place] [address] Phone: [phone] Fax: [phone]  [facility] [address] [place] [address] Phone: [phone] Fax: [phone]   Has the prescription been filled recently?   Is the patient out of the medication?   Has the patient been seen for an appointment in the last year OR does the patient have an upcoming appointment?   Can we respond through MyChart?   Agent: Please be advised that Rx refills may take up to 3 business days. We ask that you follow-up with your pharmacy.

## 2023-03-20 ENCOUNTER — Other Ambulatory Visit: Payer: Self-pay | Admitting: Internal Medicine

## 2023-03-20 DIAGNOSIS — E1165 Type 2 diabetes mellitus with hyperglycemia: Secondary | ICD-10-CM

## 2023-03-26 ENCOUNTER — Other Ambulatory Visit: Payer: Self-pay | Admitting: Internal Medicine

## 2023-03-26 DIAGNOSIS — E1165 Type 2 diabetes mellitus with hyperglycemia: Secondary | ICD-10-CM

## 2023-04-02 ENCOUNTER — Telehealth: Payer: Self-pay | Admitting: Orthopedic Surgery

## 2023-04-02 ENCOUNTER — Ambulatory Visit: Payer: Medicare Other | Admitting: Orthopedic Surgery

## 2023-04-02 DIAGNOSIS — Z89512 Acquired absence of left leg below knee: Secondary | ICD-10-CM

## 2023-04-02 NOTE — Telephone Encounter (Signed)
 Disregard

## 2023-04-09 ENCOUNTER — Encounter: Payer: Self-pay | Admitting: Orthopedic Surgery

## 2023-04-09 ENCOUNTER — Other Ambulatory Visit: Payer: Self-pay | Admitting: Internal Medicine

## 2023-04-09 NOTE — Progress Notes (Signed)
Office Visit Note   Patient: Grant Yang           Date of Birth: 11-10-1955           MRN: [id number] Visit Date: 04/02/2023              Requested by: Billie Lade, MD [address] [place],  [place] [address] PCP: Billie Lade, MD  Chief Complaint  Patient presents with   Left Leg - Follow-up    09/17/2022 left BKA       HPI: Patient is a 68 year old gentleman who is status post a left below-knee amputation July 2024.  Patient has a small end bearing ulcer without drainage.  Patient is 7 months out from surgery.  Assessment & Plan: Visit Diagnoses:  1. S/P BKA (below knee amputation) unilateral, left (HCC)     Plan: Patient is provided a prescription for a new prosthesis.  He has a broken foot and ankle and has  end bearing pressure.  Patient will need a new foot and ankle and socket.  Follow-Up Instructions: Return if symptoms worsen or fail to improve.   Ortho Exam  Patient is alert, oriented, no adenopathy, well-dressed, normal affect, normal respiratory effort. Examination there are no open ulcers.  There was a suture knot that was removed.  Patient states he is unstable on uneven terrain due to the noncompliance with his current foot.  Patient is an existing left transtibial  amputee.  Patient's current comorbidities are not expected to impact the ability to function with the prescribed prosthesis. Patient verbally communicates a strong desire to use a prosthesis. Patient currently requires mobility aids to ambulate without a prosthesis.  Expects not to use mobility aids with a new prosthesis.  Patient is a K3 level ambulator that spends a lot of time walking around on uneven terrain over obstacles, up and down stairs, and ambulates with a variable cadence.     Imaging: No results found. No images are attached to the encounter.  Labs: Lab Results  Component Value Date   HGBA1C 7.6 (H) 01/11/2023   HGBA1C 9.8 (H) 09/16/2022   HGBA1C 9.6  (H) 09/14/2022   ESRSEDRATE 104 (H) 10/04/2022   ESRSEDRATE >140 (H) 09/14/2022   ESRSEDRATE 86 (H) 07/30/2022   CRP 36.7 (H) 10/04/2022   CRP 25.9 (H) 09/14/2022   CRP 12.4 (H) 07/30/2022   REPTSTATUS 09/21/2022 FINAL 09/17/2022   GRAMSTAIN  09/17/2022    ABUNDANT GRAM NEGATIVE RODS ABUNDANT GRAM POSITIVE COCCI FEW GRAM POSITIVE RODS FEW WBC PRESENT, PREDOMINANTLY PMN    CULT  09/17/2022    MODERATE PROTEUS MIRABILIS FEW ENTEROCOCCUS FAECALIS ABUNDANT PREVOTELLA BIVIA BETA LACTAMASE POSITIVE Performed at [facility], [address], [place], [place] [address]    Imelda Pillow PROTEUS MIRABILIS 09/17/2022   LABORGA ENTEROCOCCUS FAECALIS 09/17/2022     Lab Results  Component Value Date   ALBUMIN <1.5 (L) 09/21/2022   ALBUMIN <1.5 (L) 09/19/2022   ALBUMIN <1.5 (L) 09/18/2022   PREALBUMIN <5 (L) 09/16/2022    Lab Results  Component Value Date   MG 2.0 09/19/2022   MG 2.1 09/18/2022   MG 2.0 09/17/2022   No results found for: "VD25OH"  Lab Results  Component Value Date   PREALBUMIN <5 (L) 09/16/2022      Latest Ref Rng & Units 01/11/2023   11:47 AM 10/04/2022   11:41 AM 09/21/2022    7:16 AM  CBC EXTENDED  WBC 3.4 -  10.8 x10E3/uL 13.7  14.3  14.4   RBC 4.14 - 5.80 x10E6/uL 5.25  4.09  3.26   Hemoglobin 13.0 - 17.7 g/dL 16.1  09.6  8.9   HCT 04.5 - 51.0 % 46.4  33.8  26.6   Platelets 150 - 450 x10E3/uL 331  377  729   NEUT# 1.4 - 7.0 x10E3/uL 9.2     Lymph# 0.7 - 3.1 x10E3/uL 2.8        There is no height or weight on file to calculate BMI.  Orders:  No orders of the defined types were placed in this encounter.  No orders of the defined types were placed in this encounter.    Procedures: No procedures performed  Clinical Data: No additional findings.  ROS:  All other systems negative, except as noted in the HPI. Review of Systems  Objective: Vital Signs: There were no vitals taken for this visit.  Specialty Comments:  No specialty comments  available.  PMFS History: Patient Active Problem List   Diagnosis Date Noted   Colon cancer screening 01/11/2023   S/P BKA (below knee amputation) unilateral, left (HCC) 11/07/2022   Mixed hyperlipidemia 11/07/2022   Injury of left hand 11/07/2022   Severe protein-calorie malnutrition (HCC) 09/14/2022   Osteomyelitis of left lower extremity (HCC) 09/13/2022   Severe sepsis (HCC) 09/13/2022   Penetrating foot wound, left, initial encounter 09/13/2022   AKI (acute kidney injury) (HCC) 09/13/2022   High anion gap metabolic acidosis 09/13/2022   Dehydration 09/13/2022   Acute prerenal azotemia 09/13/2022   DM2 (diabetes mellitus, type 2) (HCC) 09/13/2022   PAD (peripheral artery disease) (HCC) 08/16/2022   Hospital discharge follow-up 08/16/2022   Foot ulcer, left (HCC) 07/30/2022   History of CVA (cerebrovascular accident) 07/05/2022   Medication management 06/27/2022   Acute osteomyelitis of left foot (HCC) 05/30/2022   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) 05/30/2022   Cellulitis of left leg 05/29/2022   COPD suggested by initial evaluation (HCC) 05/29/2022   Prostate cancer (HCC) 01/30/2017   Cellulitis of left hand 02/01/17 I and D 01/30/2017   Essential hypertension 01/30/2017   Uncontrolled type 2 diabetes mellitus with hyperglycemia (HCC) 01/30/2017   Acute hyponatremia 12/29/2010   Past Medical History:  Diagnosis Date   Cancer (HCC)    prostate cancer    Diabetes mellitus    GERD (gastroesophageal reflux disease)    History of kidney stones    Hypertension    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) 2010   "mini stroke" patient cant remember when; states it happened in [place] in 2010 . denies any residual deficits     Family History  Problem Relation Age of Onset   Heart failure Mother     Cancer Father     Past Surgical History:  Procedure Laterality Date   ABDOMINAL AORTOGRAM W/LOWER EXTREMITY N/A 07/31/2022   Procedure: ABDOMINAL AORTOGRAM W/LOWER EXTREMITY;  Surgeon: Leonie Douglas, MD;  [facility]: [facility] IN[facility] LAB;  Service: Cardiovascular;  Laterality: N/A;   AMPUTATION Left 05/31/2022   Procedure: LEFT 2ND TOE AMPUTATION;  Surgeon: Nadara Mustard, MD;  Location: [facility] OR;  Service: Orthopedics;  Laterality: Left;   AMPUTATION Left 09/17/2022   Procedure: AMPUTATION BELOW KNEE;  Surgeon: Nadara Mustard,  MD;  Location: [facility] OR;  Service: Orthopedics;  Laterality: Left;   APPENDECTOMY     APPLICATION OF WOUND VAC Left 09/17/2022   Procedure: APPLICATION OF WOUND VAC;  Surgeon: Nadara Mustard, MD;  Location: [facility] OR;  Service: Orthopedics;  Laterality: Left;   CHOLECYSTECTOMY  12/30/2010   Procedure: LAPAROSCOPIC CHOLECYSTECTOMY;  Surgeon: Dalia Heading;  Location: [facility] ORS;  Service: General;  Laterality: N/A;   I & D EXTREMITY Left 05/31/2022   Procedure: LEFT FOOT DEBRIDEMENT;  Surgeon: Nadara Mustard, MD;  Location: [facility] OR;  Service: Orthopedics;  Laterality: Left;   INCISION AND DRAINAGE ABSCESS Left 02/01/2017   Procedure: INCISION AND DRAINAGE ABSCESS LEFT HAND;  Surgeon: Vickki Hearing, MD;  Location: [facility];  Service: Orthopedics;  Laterality: Left;   LOWER EXTREMITY ANGIOGRAPHY N/A 08/03/2022   Procedure: Lower Extremity Angiography;  Surgeon: Cephus Shelling, MD;  Location: [facility] INVASIVE CV LAB;  Service: Cardiovascular;  Laterality: N/A;   LYMPHADENECTOMY Bilateral 08/02/2017   Procedure: LYMPHADENECTOMY, PELVIC;  Surgeon: Heloise Purpura, MD;  Location: [facility];  Service: Urology;  Laterality: Bilateral;   PERIPHERAL VASCULAR BALLOON ANGIOPLASTY  08/03/2022   Procedure: PERIPHERAL VASCULAR BALLOON ANGIOPLASTY;  Surgeon: Cephus Shelling, MD;  Location: [facility] LAB;  Service: Cardiovascular;;   ROBOT ASSISTED LAPAROSCOPIC RADICAL PROSTATECTOMY N/A 08/02/2017    Procedure: XI ROBOTIC ASSISTED LAPAROSCOPIC RADICAL PROSTATECTOMY LEVEL 2;  Surgeon: Heloise Purpura, MD;  Location: [facility] ORS;  Service: Urology;  Laterality: N/A;   Social History   Occupational History   Not on file  Tobacco Use   Smoking status: Former    Current packs/day: 0.00    Average packs/day: 2.0 packs/day for 38.0 years (76.0 ttl pk-yrs)    Types: Cigarettes    Start date: 67    Quit date: 2010    Years since quitting: 15.1   Smokeless tobacco: Never  Substance and Sexual Activity   Alcohol use: No   Drug use: Yes    Types: Marijuana    Comment: occ   Sexual activity: Yes

## 2023-04-14 ENCOUNTER — Other Ambulatory Visit: Payer: Self-pay | Admitting: Internal Medicine

## 2023-04-14 DIAGNOSIS — I10 Essential (primary) hypertension: Secondary | ICD-10-CM

## 2023-04-26 ENCOUNTER — Other Ambulatory Visit: Payer: Self-pay | Admitting: Internal Medicine

## 2023-04-26 DIAGNOSIS — E1165 Type 2 diabetes mellitus with hyperglycemia: Secondary | ICD-10-CM

## 2023-05-11 ENCOUNTER — Other Ambulatory Visit: Payer: Self-pay | Admitting: Internal Medicine

## 2023-05-13 ENCOUNTER — Other Ambulatory Visit: Payer: Self-pay | Admitting: Internal Medicine

## 2023-05-13 DIAGNOSIS — I10 Essential (primary) hypertension: Secondary | ICD-10-CM

## 2023-05-24 ENCOUNTER — Other Ambulatory Visit: Payer: Self-pay | Admitting: Internal Medicine

## 2023-05-24 DIAGNOSIS — E1165 Type 2 diabetes mellitus with hyperglycemia: Secondary | ICD-10-CM

## 2023-05-26 ENCOUNTER — Other Ambulatory Visit: Payer: Self-pay | Admitting: Internal Medicine

## 2023-05-26 DIAGNOSIS — E1165 Type 2 diabetes mellitus with hyperglycemia: Secondary | ICD-10-CM

## 2023-05-26 DIAGNOSIS — Z8673 Personal history of transient ischemic attack (TIA), and cerebral infarction without residual deficits: Secondary | ICD-10-CM

## 2023-06-05 ENCOUNTER — Other Ambulatory Visit: Payer: Self-pay | Admitting: Internal Medicine

## 2023-06-09 ENCOUNTER — Other Ambulatory Visit: Payer: Self-pay | Admitting: Internal Medicine

## 2023-06-09 DIAGNOSIS — I10 Essential (primary) hypertension: Secondary | ICD-10-CM

## 2023-06-11 ENCOUNTER — Other Ambulatory Visit: Payer: Self-pay

## 2023-06-11 DIAGNOSIS — I10 Essential (primary) hypertension: Secondary | ICD-10-CM

## 2023-06-11 MED ORDER — OLMESARTAN MEDOXOMIL 20 MG PO TABS: 20.0000 mg | ORAL_TABLET | Freq: Every day | ORAL | 0 refills | Status: AC

## 2023-06-11 MED ORDER — OLMESARTAN MEDOXOMIL 20 MG PO TABS: 20.0000 mg | ORAL_TABLET | Freq: Every day | ORAL | 0 refills | Status: DC

## 2023-06-19 ENCOUNTER — Other Ambulatory Visit: Payer: Self-pay | Admitting: Internal Medicine

## 2023-06-19 DIAGNOSIS — E1165 Type 2 diabetes mellitus with hyperglycemia: Secondary | ICD-10-CM

## 2023-06-20 ENCOUNTER — Other Ambulatory Visit: Payer: Self-pay | Admitting: Internal Medicine

## 2023-06-20 DIAGNOSIS — E1165 Type 2 diabetes mellitus with hyperglycemia: Secondary | ICD-10-CM

## 2023-06-25 ENCOUNTER — Other Ambulatory Visit (HOSPITAL_BASED_OUTPATIENT_CLINIC_OR_DEPARTMENT_OTHER): Payer: Self-pay

## 2023-07-02 ENCOUNTER — Ambulatory Visit: Payer: Medicare Other | Admitting: Orthopedic Surgery

## 2023-07-02 ENCOUNTER — Encounter: Payer: Self-pay | Admitting: Orthopedic Surgery

## 2023-07-02 DIAGNOSIS — Z89512 Acquired absence of left leg below knee: Secondary | ICD-10-CM

## 2023-07-02 NOTE — Progress Notes (Signed)
 Office Visit Note   Patient: Grant Yang           Date of Birth: [birth year]           MRN: [id number] Visit Date: 07/02/2023              Requested by: Tobi Fortes, MD [address] [place],  [place] [address] PCP: Tobi Fortes, MD  Chief Complaint  Patient presents with   Left Foot - Follow-up    Hx left BKA      HPI: Patient is a 68 year old gentleman who is 8 months status post left transtibial amputation with prosthesis.  Patient has had significant decrease in the volume of the residual limb with subsiding into the socket with ulceration over the fibular head.  Assessment & Plan: Visit Diagnoses:  1. S/P BKA (below knee amputation) unilateral, left (HCC)     Plan: Patient will continue to pad and offload the fibular head.  Patient was provided a prescription for [employer] for new socket and liner.  Follow-Up Instructions: Return if symptoms worsen or fail to improve.   Ortho Exam  Patient is alert, oriented, no adenopathy, well-dressed, normal affect, normal respiratory effort. Examination the liner is torn.  Patient is subsiding into the socket.  He has developed a ulcer over the fibular head.  No exposed bone or tendon no cellulitis.  Patient is wearing over 15 ply sock.  Patient is an existing left transtibial  amputee.  Patient's current comorbidities are not expected to impact the ability to function with the prescribed prosthesis. Patient verbally communicates a strong desire to use a prosthesis. Patient currently requires mobility aids to ambulate without a prosthesis.  Expects not to use mobility aids with a new prosthesis. Patient is expected to resume or reach their K Level within 6 months. Patient was active before the amputation and independent with stairs, uneven terrain, varying cadence, and a community ambulator.  Patient is a K3 level ambulator that spends a lot of time walking around on uneven terrain over obstacles, up and down  stairs, and ambulates with a variable cadence.     Imaging: No results found. No images are attached to the encounter.  Labs: Lab Results  Component Value Date   HGBA1C 7.6 (H) 01/11/2023   HGBA1C 9.8 (H) 09/16/2022   HGBA1C 9.6 (H) 09/14/2022   ESRSEDRATE 104 (H) 10/04/2022   ESRSEDRATE >140 (H) 09/14/2022   ESRSEDRATE 86 (H) 07/30/2022   CRP 36.7 (H) 10/04/2022   CRP 25.9 (H) 09/14/2022   CRP 12.4 (H) 07/30/2022   REPTSTATUS 09/21/2022 FINAL 09/17/2022   GRAMSTAIN  09/17/2022    ABUNDANT GRAM NEGATIVE RODS ABUNDANT GRAM POSITIVE COCCI FEW GRAM POSITIVE RODS FEW WBC PRESENT, PREDOMINANTLY PMN    CULT  09/17/2022    MODERATE PROTEUS MIRABILIS FEW ENTEROCOCCUS FAECALIS ABUNDANT PREVOTELLA BIVIA BETA LACTAMASE POSITIVE Performed at [facility], [address], [place], [place] [address]    Joe Murders PROTEUS MIRABILIS 09/17/2022   LABORGA ENTEROCOCCUS FAECALIS 09/17/2022     Lab Results  Component Value Date   ALBUMIN  <1.5 (L) 09/21/2022   ALBUMIN  <1.5 (L) 09/19/2022   ALBUMIN  <1.5 (L) 09/18/2022   PREALBUMIN <5 (L) 09/16/2022    Lab Results  Component Value Date   MG 2.0 09/19/2022   MG 2.1 09/18/2022   MG 2.0 09/17/2022   No results found for: "VD25OH"  Lab Results  Component Value Date   PREALBUMIN <5 (L) 09/16/2022  Latest Ref Rng & Units 01/11/2023   11:47 AM 10/04/2022   11:41 AM 09/21/2022    7:16 AM  CBC EXTENDED  WBC 3.4 - 10.8 x10E3/uL 13.7  14.3  14.4   RBC 4.14 - 5.80 x10E6/uL 5.25  4.09  3.26   Hemoglobin 13.0 - 17.7 g/dL 16.1  09.6  8.9   HCT 04.5 - 51.0 % 46.4  33.8  26.6   Platelets 150 - 450 x10E3/uL 331  377  729   NEUT# 1.4 - 7.0 x10E3/uL 9.2     Lymph# 0.7 - 3.1 x10E3/uL 2.8        There is no height or weight on file to calculate BMI.  Orders:  No orders of the defined types were placed in this encounter.  No orders of the defined types were placed in this encounter.    Procedures: No procedures  performed  Clinical Data: No additional findings.  ROS:  All other systems negative, except as noted in the HPI. Review of Systems  Objective: Vital Signs: There were no vitals taken for this visit.  Specialty Comments:  No specialty comments available.  PMFS History: Patient Active Problem List   Diagnosis Date Noted   Colon cancer screening 01/11/2023   S/P BKA (below knee amputation) unilateral, left (HCC) 11/07/2022   Mixed hyperlipidemia 11/07/2022   Injury of left hand 11/07/2022   Severe protein-calorie malnutrition (HCC) 09/14/2022   Osteomyelitis of left lower extremity (HCC) 09/13/2022   Severe sepsis (HCC) 09/13/2022   Penetrating foot wound, left, initial encounter 09/13/2022   AKI (acute kidney injury) (HCC) 09/13/2022   High anion gap metabolic acidosis 09/13/2022   Dehydration 09/13/2022   Acute prerenal azotemia 09/13/2022   DM2 (diabetes mellitus, type 2) (HCC) 09/13/2022   PAD (peripheral artery disease) (HCC) 08/16/2022   Hospital discharge follow-up 08/16/2022   Foot ulcer, left (HCC) 07/30/2022   History of CVA (cerebrovascular accident) 07/05/2022   Medication management 06/27/2022   Acute osteomyelitis of left foot (HCC) 05/30/2022   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) 05/30/2022   Cellulitis of left leg 05/29/2022   COPD suggested by initial evaluation (HCC) 05/29/2022   Prostate cancer (HCC) 01/30/2017   Cellulitis of left hand 02/01/17 I and D 01/30/2017   Essential hypertension 01/30/2017   Uncontrolled type 2 diabetes mellitus with hyperglycemia (HCC) 01/30/2017   Acute hyponatremia 12/29/2010   Past Medical History:  Diagnosis Date   Cancer (HCC)    prostate cancer    Diabetes mellitus    GERD (gastroesophageal reflux disease)    History of kidney stones    Hypertension    Neuropathy in diabetes (HCC)    bilateral lower extremities    Shortness of breath    patient states it hurts to breath; this  was when he use to smoke cigarettes    Stroke [facility]) 2010   "mini stroke" patient cant remember when; states it happened [place]  in [place] . denies any residual deficits     Family History  Problem Relation Age of Onset   Heart failure Mother    Cancer Father     Past Surgical History:  Procedure Laterality Date   ABDOMINAL AORTOGRAM W/LOWER EXTREMITY N/A 07/31/2022   Procedure: ABDOMINAL AORTOGRAM W/LOWER EXTREMITY;  Surgeon: Carlene Che, MD;  Location: [facility] INVASIVE CV LAB;  Service: Cardiovascular;  Laterality: N/A;   AMPUTATION Left 05/31/2022   Procedure: LEFT 2ND TOE AMPUTATION;  Surgeon: Gearldean Keepers  V, MD;  Location: [facility] OR;  Service: Orthopedics;  Laterality: Left;   AMPUTATION Left 09/17/2022   Procedure: AMPUTATION BELOW KNEE;  Surgeon: Timothy Ford, MD;  Location: [facility] OR;  Service: Orthopedics;  Laterality: Left;   APPENDECTOMY     APPLICATION OF WOUND VAC Left 09/17/2022   Procedure: APPLICATION OF WOUND VAC;  Surgeon: Timothy Ford, MD;  Location: [facility] OR;  Service: Orthopedics;  Laterality: Left;   CHOLECYSTECTOMY  12/30/2010   Procedure: LAPAROSCOPIC CHOLECYSTECTOMY;  Surgeon: Beau Bound;  Location: [facility] ORS;  Service: General;  Laterality: N/A;   I & D EXTREMITY Left 05/31/2022   Procedure: LEFT FOOT DEBRIDEMENT;  Surgeon: Timothy Ford, MD;  Location: [facility] OR;  Service: Orthopedics;  Laterality: Left;   INCISION AND DRAINAGE ABSCESS Left 02/01/2017   Procedure: INCISION AND DRAINAGE ABSCESS LEFT HAND;  Surgeon: Darrin Emerald, MD;  Location: [facility] ORS;  Service: Orthopedics;  Laterality: Left;   LOWER EXTREMITY ANGIOGRAPHY N/A 08/03/2022   Procedure: Lower Extremity Angiography;  Surgeon: Young Hensen, MD;  Location: [facility] INVASIVE CV LAB;  Service: Cardiovascular;  Laterality: N/A;   LYMPHADENECTOMY Bilateral 08/02/2017   Procedure: LYMPHADENECTOMY, PELVIC;  Surgeon: Florencio Hunting, MD;  Location: [facility] ORS;  Service: Urology;  Laterality: Bilateral;   PERIPHERAL  VASCULAR BALLOON ANGIOPLASTY  08/03/2022   Procedure: PERIPHERAL VASCULAR BALLOON ANGIOPLASTY;  Surgeon: Young Hensen, MD;  Location: [facility];  Service: Cardiovascular;;   ROBOT ASSISTED LAPAROSCOPIC RADICAL PROSTATECTOMY N/A 08/02/2017   Procedure: XI ROBOTIC ASSISTED LAPAROSCOPIC RADICAL PROSTATECTOMY LEVEL 2;  Surgeon: Florencio Hunting, MD;  Location: [facility] ORS;  Service: Urology;  Laterality: N/A;   Social History   Occupational History   Not on file  Tobacco Use   Smoking status: Former    Current packs/day: 0.00    Average packs/day: 2.0 packs/day for 38.0 years (76.0 ttl pk-yrs)    Types: Cigarettes    Start date: 57    Quit date: 2010    Years since quitting: 15.3   Smokeless tobacco: Never  Substance and Sexual Activity   Alcohol use: No   Drug use: Yes    Types: Marijuana    Comment: occ   Sexual activity: Yes

## 2023-07-03 ENCOUNTER — Other Ambulatory Visit: Payer: Self-pay | Admitting: Internal Medicine

## 2023-07-10 ENCOUNTER — Other Ambulatory Visit: Payer: Self-pay | Admitting: Internal Medicine

## 2023-07-10 DIAGNOSIS — E1165 Type 2 diabetes mellitus with hyperglycemia: Secondary | ICD-10-CM

## 2023-07-27 ENCOUNTER — Other Ambulatory Visit: Payer: Self-pay | Admitting: Internal Medicine

## 2023-07-27 DIAGNOSIS — E1165 Type 2 diabetes mellitus with hyperglycemia: Secondary | ICD-10-CM

## 2023-07-27 MED ORDER — DEXCOM G7 SENSOR MISC: 0 refills | Status: DC

## 2023-07-27 NOTE — Telephone Encounter (Signed)
 Copied from CRM [id number]. Topic: Clinical - Medication Refill >> Jul 27, 2023 10:11 AM [place] A wrote: Medication: Continuous Glucose Sensor (DEXCOM G7 SENSOR) MISC  Has the patient contacted their pharmacy? Yes (Agent: If no, request that the patient contact the pharmacy for the refill. If patient does not wish to contact the pharmacy document the reason why and proceed with request.) (Agent: If yes, when and what did the pharmacy advise?)  This is the patient's preferred pharmacy:  [facility] [address], [place] - [address] [place] [address] Phone: [phone] Fax: [phone]   Is this the correct pharmacy for this prescription? Yes If no, delete pharmacy and type the correct one.   Has the prescription been filled recently? No  Is the patient out of the medication? Yes  Has the patient been seen for an appointment in the last year OR does the patient have an upcoming appointment? Yes  Can we respond through MyChart? Yes  Agent: Please be advised that Rx refills may take up to 3 business days. We ask that you follow-up with your pharmacy.

## 2023-08-01 ENCOUNTER — Other Ambulatory Visit: Payer: Self-pay | Admitting: Internal Medicine

## 2023-08-08 DIAGNOSIS — Z89512 Acquired absence of left leg below knee: Secondary | ICD-10-CM | POA: Diagnosis not present

## 2023-08-21 ENCOUNTER — Telehealth: Payer: Self-pay | Admitting: Internal Medicine

## 2023-08-21 DIAGNOSIS — E1165 Type 2 diabetes mellitus with hyperglycemia: Secondary | ICD-10-CM

## 2023-08-21 NOTE — Telephone Encounter (Unsigned)
 Copied from CRM [id number]. Topic: Clinical - Medication Refill >> Aug 21, 2023  3:14 PM Berwyn MATSU wrote: Medication: Continuous Glucose Sensor (DEXCOM G7 SENSOR) MISC  Has the patient contacted their pharmacy? Yes (Agent: If no, request that the patient contact the pharmacy for the refill. If patient does not wish to contact the pharmacy document the reason why and proceed with request.) (Agent: If yes, when and what did the pharmacy advise?)  This is the patient's preferred pharmacy:  [facility] [address] [place] - [address] [place] [address] [place] [address] Phone: [phone] Fax: [phone]    Is this the correct pharmacy for this prescription? Yes If no, delete pharmacy and type the correct one.   Has the prescription been filled recently? Yes  Is the patient out of the medication? Yes  Has the patient been seen for an appointment in the last year OR does the patient have an upcoming appointment? Yes  Can we respond through MyChart? no  Agent: Please be advised that Rx refills may take up to 3 business days. We ask that you follow-up with your pharmacy.

## 2023-08-22 MED ORDER — DEXCOM G7 SENSOR MISC: 5 refills | Status: DC

## 2023-08-27 ENCOUNTER — Other Ambulatory Visit: Payer: Self-pay | Admitting: Internal Medicine

## 2023-08-27 DIAGNOSIS — E1165 Type 2 diabetes mellitus with hyperglycemia: Secondary | ICD-10-CM

## 2023-09-14 ENCOUNTER — Other Ambulatory Visit: Payer: Self-pay | Admitting: Internal Medicine

## 2023-09-14 DIAGNOSIS — E1165 Type 2 diabetes mellitus with hyperglycemia: Secondary | ICD-10-CM

## 2023-09-17 ENCOUNTER — Other Ambulatory Visit: Payer: Self-pay | Admitting: Internal Medicine

## 2023-09-17 DIAGNOSIS — E1165 Type 2 diabetes mellitus with hyperglycemia: Secondary | ICD-10-CM

## 2023-09-22 ENCOUNTER — Other Ambulatory Visit: Payer: Self-pay

## 2023-10-10 DIAGNOSIS — E119 Type 2 diabetes mellitus without complications: Secondary | ICD-10-CM | POA: Diagnosis not present

## 2023-10-10 DIAGNOSIS — I10 Essential (primary) hypertension: Secondary | ICD-10-CM | POA: Diagnosis not present

## 2023-10-10 DIAGNOSIS — Z79899 Other long term (current) drug therapy: Secondary | ICD-10-CM | POA: Diagnosis not present

## 2023-10-10 DIAGNOSIS — Z1159 Encounter for screening for other viral diseases: Secondary | ICD-10-CM | POA: Diagnosis not present

## 2023-10-10 DIAGNOSIS — J449 Chronic obstructive pulmonary disease, unspecified: Secondary | ICD-10-CM | POA: Diagnosis not present

## 2023-10-10 DIAGNOSIS — E785 Hyperlipidemia, unspecified: Secondary | ICD-10-CM | POA: Diagnosis not present

## 2023-10-10 DIAGNOSIS — S88119A Complete traumatic amputation at level between knee and ankle, unspecified lower leg, initial encounter: Secondary | ICD-10-CM | POA: Diagnosis not present

## 2023-10-10 DIAGNOSIS — Z0000 Encounter for general adult medical examination without abnormal findings: Secondary | ICD-10-CM | POA: Diagnosis not present

## 2023-10-10 DIAGNOSIS — E1142 Type 2 diabetes mellitus with diabetic polyneuropathy: Secondary | ICD-10-CM | POA: Diagnosis not present

## 2023-10-10 LAB — LIPID PANEL
LDL Cholesterol: 64
Triglycerides: 139 (ref 40–160)

## 2023-10-10 LAB — BASIC METABOLIC PANEL WITH GFR
BUN: 20 (ref 4–21)
Creatinine: 0.9 (ref 0.6–1.3)
Glucose: 265

## 2023-10-10 LAB — TSH: TSH: 0.97 (ref 0.41–5.90)

## 2023-10-10 LAB — COMPREHENSIVE METABOLIC PANEL WITH GFR: eGFR: 92

## 2023-10-10 LAB — MICROALBUMIN / CREATININE URINE RATIO: Microalb Creat Ratio: 308

## 2023-10-11 ENCOUNTER — Other Ambulatory Visit: Payer: Self-pay | Admitting: Adult Health Nurse Practitioner

## 2023-10-11 DIAGNOSIS — Z87891 Personal history of nicotine dependence: Secondary | ICD-10-CM

## 2023-10-12 ENCOUNTER — Telehealth: Payer: Self-pay

## 2023-10-12 NOTE — Progress Notes (Signed)
 Pharmacy Quality Measure Review  This patient is appearing on a report for being at risk of failing the adherence measure for diabetes medications this calendar year.   Medication: glipizide  5 mg daily Last fill date: 06/21/2023 for 90 day supply  Left voicemail for patient to return my call at their convenience.  Woodie Jock, PharmD PGY1 Pharmacy Resident

## 2023-10-12 NOTE — Progress Notes (Signed)
 Patient states that he now being seen by [facility] by Dr. Suzen. Reports that he never had follow-up with Dr. Melvenia, but no longer needs follow up.   Patient states that he currently on basal/bolus insulin  with his new provider.   Recommended having his glipizide  transitioned to his new provider since he has follow-up with them. Current glipizide  cannot be refilled with us  due to the patient not having active follow-up.  Woodie Jock, PharmD PGY1 Pharmacy Resident

## 2023-10-20 ENCOUNTER — Other Ambulatory Visit: Payer: Self-pay

## 2023-10-21 ENCOUNTER — Other Ambulatory Visit: Payer: Self-pay

## 2023-10-21 DIAGNOSIS — E1165 Type 2 diabetes mellitus with hyperglycemia: Secondary | ICD-10-CM

## 2023-10-23 ENCOUNTER — Ambulatory Visit
Admission: RE | Admit: 2023-10-23 | Discharge: 2023-10-23 | Disposition: A | Discharge status: Home / Self Care | Source: Ambulatory Visit | Attending: Adult Health Nurse Practitioner | Admitting: Adult Health Nurse Practitioner

## 2023-10-23 DIAGNOSIS — Z87891 Personal history of nicotine dependence: Secondary | ICD-10-CM

## 2023-11-02 ENCOUNTER — Other Ambulatory Visit: Payer: Self-pay | Admitting: Internal Medicine

## 2023-11-02 DIAGNOSIS — E1165 Type 2 diabetes mellitus with hyperglycemia: Secondary | ICD-10-CM

## 2023-11-21 ENCOUNTER — Other Ambulatory Visit: Payer: Self-pay | Admitting: Internal Medicine

## 2023-11-21 DIAGNOSIS — E1165 Type 2 diabetes mellitus with hyperglycemia: Secondary | ICD-10-CM

## 2023-11-21 DIAGNOSIS — Z8673 Personal history of transient ischemic attack (TIA), and cerebral infarction without residual deficits: Secondary | ICD-10-CM

## 2023-12-04 ENCOUNTER — Other Ambulatory Visit: Payer: Self-pay | Admitting: Internal Medicine

## 2023-12-04 DIAGNOSIS — Z8673 Personal history of transient ischemic attack (TIA), and cerebral infarction without residual deficits: Secondary | ICD-10-CM

## 2023-12-04 DIAGNOSIS — E1165 Type 2 diabetes mellitus with hyperglycemia: Secondary | ICD-10-CM

## 2023-12-11 ENCOUNTER — Encounter (HOSPITAL_BASED_OUTPATIENT_CLINIC_OR_DEPARTMENT_OTHER): Attending: Internal Medicine | Admitting: Internal Medicine

## 2023-12-11 DIAGNOSIS — S81802A Unspecified open wound, left lower leg, initial encounter: Secondary | ICD-10-CM | POA: Diagnosis not present

## 2023-12-11 DIAGNOSIS — E11622 Type 2 diabetes mellitus with other skin ulcer: Secondary | ICD-10-CM | POA: Insufficient documentation

## 2023-12-11 DIAGNOSIS — Z89512 Acquired absence of left leg below knee: Secondary | ICD-10-CM | POA: Insufficient documentation

## 2023-12-11 DIAGNOSIS — X58XXXA Exposure to other specified factors, initial encounter: Secondary | ICD-10-CM | POA: Insufficient documentation

## 2023-12-11 DIAGNOSIS — L89899 Pressure ulcer of other site, unspecified stage: Secondary | ICD-10-CM | POA: Diagnosis not present

## 2023-12-24 ENCOUNTER — Encounter: Payer: Self-pay | Admitting: Radiology

## 2023-12-25 ENCOUNTER — Encounter (HOSPITAL_BASED_OUTPATIENT_CLINIC_OR_DEPARTMENT_OTHER): Attending: Internal Medicine | Admitting: Internal Medicine

## 2023-12-25 DIAGNOSIS — Z89512 Acquired absence of left leg below knee: Secondary | ICD-10-CM

## 2023-12-25 DIAGNOSIS — E11622 Type 2 diabetes mellitus with other skin ulcer: Secondary | ICD-10-CM | POA: Diagnosis not present

## 2023-12-25 DIAGNOSIS — S81802A Unspecified open wound, left lower leg, initial encounter: Secondary | ICD-10-CM | POA: Diagnosis not present

## 2024-01-04 ENCOUNTER — Telehealth: Payer: Self-pay

## 2024-01-04 NOTE — Telephone Encounter (Signed)
 Patient is on the [facility] list as not completing a PCP visit for 2025. PCP listed is PATEL,RUTWIK.   Patient states that he is now going to [facility]

## 2024-01-06 ENCOUNTER — Other Ambulatory Visit: Payer: Self-pay | Admitting: Internal Medicine

## 2024-01-06 DIAGNOSIS — I10 Essential (primary) hypertension: Secondary | ICD-10-CM

## 2024-01-22 ENCOUNTER — Other Ambulatory Visit: Payer: Self-pay

## 2024-01-22 MED ORDER — CLOPIDOGREL BISULFATE 75 MG PO TABS: 75.0000 mg | ORAL_TABLET | Freq: Every day | ORAL | 3 refills | Status: AC

## 2024-03-06 ENCOUNTER — Other Ambulatory Visit: Payer: Self-pay

## 2024-03-06 DIAGNOSIS — E1165 Type 2 diabetes mellitus with hyperglycemia: Secondary | ICD-10-CM

## 2024-03-10 NOTE — Patient Instructions (Signed)

## 2024-03-13 ENCOUNTER — Encounter: Payer: Self-pay | Admitting: Nurse Practitioner

## 2024-03-13 ENCOUNTER — Ambulatory Visit: Admitting: Nurse Practitioner

## 2024-03-13 VITALS — BP 146/82 | HR 64 | Resp 18 | Ht 71.0 in | Wt 230.0 lb

## 2024-03-13 DIAGNOSIS — Z7984 Long term (current) use of oral hypoglycemic drugs: Secondary | ICD-10-CM | POA: Diagnosis not present

## 2024-03-13 DIAGNOSIS — I10 Essential (primary) hypertension: Secondary | ICD-10-CM | POA: Diagnosis not present

## 2024-03-13 DIAGNOSIS — Z794 Long term (current) use of insulin: Secondary | ICD-10-CM

## 2024-03-13 DIAGNOSIS — E1165 Type 2 diabetes mellitus with hyperglycemia: Secondary | ICD-10-CM

## 2024-03-13 DIAGNOSIS — E782 Mixed hyperlipidemia: Secondary | ICD-10-CM | POA: Diagnosis not present

## 2024-03-13 LAB — POCT GLYCOSYLATED HEMOGLOBIN (HGB A1C): Hemoglobin A1C: 8.7 % — AB (ref 4.0–5.6)

## 2024-03-13 MED ORDER — GLIPIZIDE 5 MG PO TABS: 5.0000 mg | ORAL_TABLET | Freq: Every day | ORAL | 1 refills | Status: AC

## 2024-03-13 MED ORDER — PEN NEEDLES 31G X 6 MM MISC: 3 refills | Status: AC

## 2024-03-13 MED ORDER — INSULIN LISPRO 100 UNIT/ML IJ SOLN: 5.0000 [IU] | Freq: Three times a day (TID) | INTRAMUSCULAR | 3 refills | Status: AC

## 2024-03-13 MED ORDER — LANTUS SOLOSTAR 100 UNIT/ML ~~LOC~~ SOPN: 30.0000 [IU] | PEN_INJECTOR | Freq: Every day | SUBCUTANEOUS | 1 refills | Status: AC

## 2024-03-13 MED ORDER — DEXCOM G7 SENSOR MISC: 5 refills | Status: AC

## 2024-03-13 NOTE — Progress Notes (Signed)
 "                                                                        Endocrinology Consult Note       03/13/2024, 11:11 AM   Subjective:    Patient ID: Grant Yang, male    DOB: 09-19-1955.  Grant Yang is being seen in consultation for management of currently uncontrolled symptomatic diabetes requested by  Stephen Rojelio PARAS, NP.   Past Medical History:  Diagnosis Date   Cancer [facility])    prostate cancer    Diabetes mellitus    GERD (gastroesophageal reflux disease)    History of kidney stones    Hypertension    Neuropathy in diabetes [facility])    bilateral lower extremities    Shortness of breath    patient states it hurts to breath; this was when he use to smoke cigarettes    Stroke [facility]) 2010   mini stroke patient cant remember when; states it happened in [place]  in 2010 . denies any residual deficits     Past Surgical History:  Procedure Laterality Date   ABDOMINAL AORTOGRAM W/LOWER EXTREMITY N/A 07/31/2022   Procedure: ABDOMINAL AORTOGRAM W/LOWER EXTREMITY;  Surgeon: Magda Debby SAILOR, MD;  Location: [facility] INVASIVE CV LAB;  Service: Cardiovascular;  Laterality: N/A;   AMPUTATION Left 05/31/2022   Procedure: LEFT 2ND TOE AMPUTATION;  Surgeon: Harden Jerona GAILS, MD;  Location: [facility] OR;  Service: Orthopedics;  Laterality: Left;   AMPUTATION Left 09/17/2022   Procedure: AMPUTATION BELOW KNEE;  Surgeon: Harden Jerona GAILS, MD;  Location: [facility] OR;  Service: Orthopedics;  Laterality: Left;   APPENDECTOMY     APPLICATION OF WOUND VAC Left 09/17/2022   Procedure: APPLICATION OF WOUND VAC;  Surgeon: Harden Jerona GAILS, MD;  Location: [facility] OR;  Service: Orthopedics;  Laterality: Left;   CHOLECYSTECTOMY  12/30/2010   Procedure: LAPAROSCOPIC CHOLECYSTECTOMY;  Surgeon: Oneil DELENA Budge;  Location: [facility] ORS;  Service: General;  Laterality: N/A;   I & D EXTREMITY Left 05/31/2022   Procedure: LEFT FOOT DEBRIDEMENT;  Surgeon: Harden Jerona GAILS, MD;  Location: [facility] OR;  Service: Orthopedics;  Laterality: Left;    INCISION AND DRAINAGE ABSCESS Left 02/01/2017   Procedure: INCISION AND DRAINAGE ABSCESS LEFT HAND;  Surgeon: Margrette Taft BRAVO, MD;  Location: [facility] ORS;  Service: Orthopedics;  Laterality: Left;   LOWER EXTREMITY ANGIOGRAPHY N/A 08/03/2022   Procedure: Lower Extremity Angiography;  Surgeon: Gretta Lonni PARAS, MD;  Location: [facility] INVASIVE CV LAB;  Service: Cardiovascular;  Laterality: N/A;   LYMPHADENECTOMY Bilateral 08/02/2017   Procedure: LYMPHADENECTOMY, PELVIC;  Surgeon: Renda Glance, MD;  Location: [facility] ORS;  Service: Urology;  Laterality: Bilateral;   PERIPHERAL VASCULAR BALLOON ANGIOPLASTY  08/03/2022   Procedure: PERIPHERAL VASCULAR BALLOON ANGIOPLASTY;  Surgeon: Gretta Lonni PARAS, MD;  Location: [facility] INVASIVE CV LAB;  Service: Cardiovascular;;   ROBOT ASSISTED LAPAROSCOPIC RADICAL PROSTATECTOMY N/A 08/02/2017   Procedure: XI ROBOTIC ASSISTED LAPAROSCOPIC RADICAL PROSTATECTOMY LEVEL 2;  Surgeon: Renda Glance, MD;  Location: [facility] ORS;  Service: Urology;  Laterality: N/A;    Social History   Socioeconomic History   Marital status: Divorced    Spouse name: Not on file   Number of children: Not  on file   Years of education: Not on file   Highest education level: Not on file  Occupational History   Not on file  Tobacco Use   Smoking status: Former    Current packs/day: 0.00    Average packs/day: 2.0 packs/day for 38.0 years (76.0 ttl pk-yrs)    Types: Cigarettes    Start date: 69    Quit date: 2010    Years since quitting: 16.0   Smokeless tobacco: Never  Substance and Sexual Activity   Alcohol use: No   Drug use: Yes    Types: Marijuana    Comment: occ   Sexual activity: Yes  Other Topics Concern   Not on file  Social History Narrative   Not on file   Social Drivers of Health   Tobacco Use: Medium Risk (03/13/2024)   Patient History    Smoking Tobacco Use: Former    Smokeless Tobacco Use: Never    Passive Exposure: Not on Actuary Strain: Not on  file  Food Insecurity: No Food Insecurity (09/27/2022)   Hunger Vital Sign    Worried About Running Out of Food in the Last Year: Never true    Ran Out of Food in the Last Year: Never true  Transportation Needs: No Transportation Needs (09/27/2022)   PRAPARE - Administrator, Civil Service (Medical): No    Lack of Transportation (Non-Medical): No  Physical Activity: Not on file  Stress: Not on file  Social Connections: Not on file  Depression (PHQ2-9): Low Risk (01/11/2023)   Depression (PHQ2-9)    PHQ-2 Score: 0  Alcohol Screen: Not on file  Housing: Low Risk (09/27/2022)   Housing    Last Housing Risk Score: 0  Utilities: Not At Risk (07/30/2022)   AHC Utilities    Threatened with loss of utilities: No  Health Literacy: Not on file    Family History  Problem Relation Age of Onset   Heart failure Mother    Cancer Father     Outpatient Encounter Medications as of 03/13/2024  Medication Sig   albuterol  (VENTOLIN  HFA) 108 (90 Base) MCG/ACT inhaler Inhale 2 puffs into the lungs every 6 (six) hours as needed for wheezing or shortness of breath.   ascorbic acid  (VITAMIN C ) 1000 MG tablet Take 1 tablet (1,000 mg total) by mouth daily.   aspirin  EC 81 MG tablet Take 1 tablet (81 mg total) by mouth daily. Swallow whole.   atorvastatin  (LIPITOR) 40 MG tablet Take 1 tablet by mouth once daily   blood glucose meter kit and supplies Dispense based on patient and insurance preference. Use up to four times daily as directed. (FOR ICD-10 E10.9, E11.9).   clopidogrel  (PLAVIX ) 75 MG tablet Take 1 tablet (75 mg total) by mouth daily with breakfast.   EPINEPHrine  0.3 mg/0.3 mL IJ SOAJ injection Inject 0.3 mg into the muscle once as needed for up to 1 dose (if patient exhibits significant signs and symptoms of allergic reaction.).   gabapentin  (NEURONTIN ) 300 MG capsule Take 1 capsule by mouth three times daily as needed   insulin  glargine (LANTUS  SOLOSTAR) 100 UNIT/ML Solostar Pen Inject 30  Units into the skin at bedtime.   insulin  lispro (HUMALOG ) 100 UNIT/ML injection Inject 0.05-0.11 mLs (5-11 Units total) into the skin 3 (three) times daily with meals.   Insulin  Pen Needle (PEN NEEDLES) 31G X 6 MM MISC Use to inject insulin  4 times daily   olmesartan  (BENICAR ) 20 MG tablet  Take 1 tablet (20 mg total) by mouth daily.   zinc  sulfate 220 (50 Zn) MG capsule Take 1 capsule (220 mg total) by mouth daily.   [DISCONTINUED] Continuous Glucose Sensor (DEXCOM G7 SENSOR) MISC USE AS DIRECTED, CHANGE EVERY 10 DAYS   [DISCONTINUED] glipiZIDE  (GLUCOTROL ) 5 MG tablet TAKE 1 TABLET BY MOUTH TWICE DAILY BEFORE A MEAL   Continuous Glucose Sensor (DEXCOM G7 SENSOR) MISC USE AS DIRECTED, CHANGE EVERY 10 DAYS   glipiZIDE  (GLUCOTROL ) 5 MG tablet Take 1 tablet (5 mg total) by mouth daily before breakfast.   No facility-administered encounter medications on file as of 03/13/2024.    ALLERGIES: Allergies[1]  VACCINATION STATUS: Immunization History  Administered Date(s) Administered   Influenza Split 12/29/2010   Influenza,inj,Quad PF,6+ Mos 01/31/2017   Influenza-Unspecified 11/20/2017   Pneumococcal Polysaccharide-23 01/31/2017   Tdap 01/30/2017, 08/30/2021    Diabetes He presents for his initial diabetic visit. He has type 2 diabetes mellitus. Onset time: diagnosed at approx age of 40. His disease course has been fluctuating. Hypoglycemia symptoms include nervousness/anxiousness, sweats and tremors. Associated symptoms include blurred vision and foot paresthesias. There are no hypoglycemic complications. Diabetic complications include a CVA, peripheral neuropathy, PVD and retinopathy. (DM foot ulcers- leading to L BKA) Risk factors for coronary artery disease include diabetes mellitus, dyslipidemia, male sex, hypertension, tobacco exposure, family history and sedentary lifestyle. Current diabetic treatment includes intensive insulin  program and oral agent (monotherapy). He is compliant with  treatment most of the time. His weight is fluctuating minimally. He is following a generally unhealthy diet. When asked about meal planning, he reported none. He has not had a previous visit with a dietitian. He participates in exercise intermittently. (He presents today for his consultation with his CGM showing dramatically fluctuating glycemic profile with hyperglycemia overall.  His POCT A1c today is 8.7%.  He drinks zero sugar sodas, unsweet tea, coffee, and water.  He eats 2-3 meals per day and occasionally snacks between meals.  He maintains an active lifestyle, stays busy.  He is UTD on eye exam (gets injections for retinopathy) and is getting set up with podiatry in the area soon.  Analysis of his CGM shows TIR 27%, TAR 71%, TBR 2% with a GMI of 8.6%.  He was on Mounjaro  in the past and did well on this but cannot continue due to price.  Right now, he uses Novolin N and Novolin R bought over the counter at [facility].) An ACE inhibitor/angiotensin II receptor blocker is being taken. He does not see a podiatrist.Eye exam is current.     Review of systems  Constitutional: + Minimally fluctuating body weight, current Body mass index is 32.08 kg/m., no fatigue, no subjective hyperthermia, no subjective hypothermia Eyes: no blurry vision, no xerophthalmia ENT: no sore throat, no nodules palpated in throat, no dysphagia/odynophagia, no hoarseness Cardiovascular: no chest pain, no shortness of breath, no palpitations, no leg swelling Respiratory: no cough, no shortness of breath Gastrointestinal: no nausea/vomiting/diarrhea Musculoskeletal: no muscle/joint aches, L-BKA with prosthesis Skin: no rashes, no hyperemia Neurological: no tremors, no numbness, no tingling, no dizziness Psychiatric: no depression, no anxiety  Objective:     BP (!) 146/82   Pulse 64   Resp 18   Ht 5' 11 (1.803 m)   Wt 230 lb (104.3 kg)   BMI 32.08 kg/m   Wt Readings from Last 3 Encounters:  03/13/24 230 lb (104.3  kg)  01/11/23 195 lb (88.5 kg)  10/04/22 186 lb 14.4 oz (84.8 kg)  BP Readings from Last 3 Encounters:  03/13/24 (!) 146/82  01/11/23 129/81  11/29/22 106/70     Physical Exam- Limited  Constitutional:  Body mass index is 32.08 kg/m. , not in acute distress, normal state of mind Eyes:  EOMI, no exophthalmos Neck: Supple Cardiovascular: RRR, no murmurs, rubs, or gallops, no edema Respiratory: Adequate breathing efforts, no crackles, rales, rhonchi, or wheezing Musculoskeletal: no gross deformities, strength intact in all four extremities, no gross restriction of joint movements, L-BKA Skin:  no rashes, no hyperemia Neurological: no tremor with outstretched hands   Diabetic Foot Exam - Simple   No data filed      CMP ( most recent) CMP     Component Value Date/Time   NA 137 01/11/2023 1147   K 5.0 01/11/2023 1147   CL 100 01/11/2023 1147   CO2 22 01/11/2023 1147   GLUCOSE 180 (H) 01/11/2023 1147   GLUCOSE 241 (H) 10/04/2022 1141   BUN 20 10/10/2023 0000   CREATININE 0.9 10/10/2023 0000   CREATININE 0.81 01/11/2023 1147   CREATININE 0.71 10/04/2022 1141   CALCIUM  9.8 01/11/2023 1147   PROT 7.2 10/04/2022 1141   PROT 7.0 08/16/2022 1418   ALBUMIN  <1.5 (L) 09/21/2022 0716   ALBUMIN  3.8 (L) 08/16/2022 1418   AST 13 10/04/2022 1141   ALT 17 10/04/2022 1141   ALKPHOS 77 09/21/2022 0716   BILITOT 0.5 10/04/2022 1141   BILITOT 0.5 08/16/2022 1418   EGFR 92 10/10/2023 0000   EGFR 98 01/11/2023 1147   GFRNONAA >60 09/21/2022 0716     Diabetic Labs (most recent): Lab Results  Component Value Date   HGBA1C 7.6 (H) 01/11/2023   HGBA1C 9.8 (H) 09/16/2022   HGBA1C 9.6 (H) 09/14/2022     Lipid Panel ( most recent) Lipid Panel     Component Value Date/Time   CHOL 129 01/11/2023 1147   TRIG 139 10/10/2023 0000   HDL 40 01/11/2023 1147   CHOLHDL 3.2 01/11/2023 1147   LDLCALC 64 10/10/2023 0000   LDLCALC 63 01/11/2023 1147   LABVLDL 26 01/11/2023 1147       Lab Results  Component Value Date   TSH 0.97 10/10/2023   TSH 0.757 09/14/2022           Assessment & Plan:   1) Type 2 diabetes mellitus with hyperglycemia, with long-term current use of insulin  (HCC) (Primary)  He presents today for his consultation with his CGM showing dramatically fluctuating glycemic profile with hyperglycemia overall.  His POCT A1c today is 8.7%.  He drinks zero sugar sodas, unsweet tea, coffee, and water.  He eats 2-3 meals per day and occasionally snacks between meals.  He maintains an active lifestyle, stays busy.  He is UTD on eye exam (gets injections for retinopathy) and is getting set up with podiatry in the area soon.  Analysis of his CGM shows TIR 27%, TAR 71%, TBR 2% with a GMI of 8.6%.  He was on Mounjaro  in the past and did well on this but cannot continue due to price.  Right now, he uses Novolin N and Novolin R bought over the counter at [facility].  - Grant Yang has currently uncontrolled symptomatic type 2 DM since 69 years of age, with most recent A1c of 8.7 %.   -Recent labs reviewed.  - I had a long discussion with him about the progressive nature of diabetes and the pathology behind its complications. -his diabetes is complicated by PAD, DM foot ulcer,  CVA, neuropathy and he remains at a high risk for more acute and chronic complications which include CAD, CVA, CKD, retinopathy, and neuropathy. These are all discussed in detail with him.  The following Lifestyle Medicine recommendations according to American College of Lifestyle Medicine [facility]) were discussed and offered to patient and he agrees to start the journey:  A. Whole Foods, Plant-based plate comprising of fruits and vegetables, plant-based proteins, whole-grain carbohydrates was discussed in detail with the patient.   A list for source of those nutrients were also provided to the patient.  Patient will use only water or unsweetened tea for hydration. B.  The need to stay away from  risky substances including alcohol, smoking; obtaining 7 to 9 hours of restorative sleep, at least 150 minutes of moderate intensity exercise weekly, the importance of healthy social connections,  and stress reduction techniques were discussed. C.  A full color page of  Calorie density of various food groups per pound showing examples of each food groups was provided to the patient.  - I have counseled him on diet and weight management by adopting a carbohydrate restricted/protein rich diet. Patient is encouraged to switch to unprocessed or minimally processed complex starch and increased protein intake (animal or plant source), fruits, and vegetables. -  he is advised to stick to a routine mealtimes to eat 3 meals a day and avoid unnecessary snacks (to snack only to correct hypoglycemia).   - he acknowledges that there is a room for improvement in his food and drink choices. - Suggestion is made for him to avoid simple carbohydrates from his diet including Cakes, Sweet Desserts, Ice Cream, Soda (diet and regular), Sweet Tea, Candies, Chips, Cookies, Store Bought Juices, Alcohol in Excess of 1-2 drinks a day, Artificial Sweeteners, Coffee Creamer, and Sugar-free Products. This will help patient to have more stable blood glucose profile and potentially avoid unintended weight gain.  - I have approached him with the following individualized plan to manage his diabetes and patient agrees:   -Will try switching him to more predictable insulins with Lantus  30 units SQ nightly (instead of morning), and Humalog  5-11 units TID with meals if glucose is above 90 and eating (Specific instructions on how to titrate insulin  dosage based on glucose readings given to patient in writing).  He demonstrated his ability to properly use the SSI chart and dose meal time insulin  correctly with me today.   -he is encouraged to start/continue monitoring glucose 4 times daily, before meals and before bed, and to call the  clinic if he has readings less than 70 or above 300 for 3 tests in a row.  - he is warned not to take insulin  without proper monitoring per orders. - Adjustment parameters are given to him for hypo and hyperglycemia in writing.  - he is advised to continue Glipizide  5 mg po daily at breakfast, therapeutically suitable for patient .  He did well on Mounjaro  in the past but could not afford the copay for this moving forward.  - Specific targets for  A1c; LDL, HDL, and Triglycerides were discussed with the patient.  2) Blood Pressure /Hypertension:  his blood pressure is controlled to target.   he is advised to continue his current medications as prescribed by PCP.  3) Lipids/Hyperlipidemia:    Review of his recent lipid panel from 10/10/23 showed controlled LDL at 64 .  he is advised to continue Lipitor 40 mg daily at bedtime.  Side effects and precautions discussed with  him.  4)  Weight/Diet:  his Body mass index is 32.08 kg/m.  -  clearly complicating his diabetes care.   he is a candidate for weight loss. I discussed with him the fact that loss of 5 - 10% of his  current body weight will have the most impact on his diabetes management.  Exercise, and detailed carbohydrates information provided  -  detailed on discharge instructions.  5) Chronic Care/Health Maintenance: -he is on ACEI/ARB and Statin medications and is encouraged to initiate and continue to follow up with Ophthalmology, Dentist, Podiatrist at least yearly or according to recommendations, and advised to stay away from smoking. I have recommended yearly flu vaccine and pneumonia vaccine at least every 5 years; moderate intensity exercise for up to 150 minutes weekly; and sleep for at least 7 hours a day.  - he is advised to maintain close follow up with Stephen Rojelio PARAS, NP for primary care needs, as well as his other providers for optimal and coordinated care.   - Time spent in this patient care: 60 min, which was spent in  counseling him about his diabetes and the rest reviewing his blood glucose logs, discussing his hypoglycemia and hyperglycemia episodes, reviewing his current and previous labs/studies (including abstraction from other facilities) and medications doses and developing a long term treatment plan based on the latest standards of care/guidelines; and documenting his care.    Please refer to Patient Instructions for Blood Glucose Monitoring and Insulin /Medications Dosing Guide in media tab for additional information. Please also refer to Patient Self Inventory in the Media tab for reviewed elements of pertinent patient history.  Grant Yang participated in the discussions, expressed understanding, and voiced agreement with the above plans.  All questions were answered to his satisfaction. he is encouraged to contact clinic should he have any questions or concerns prior to his return visit.     Follow up plan: - Return in about 3 months (around 06/11/2024) for Diabetes F/U with A1c in office, No previsit labs, Bring meter and logs.    Benton Rio, [facility] [address] [place], [place] [address] Phone: [phone] Fax: [phone]  03/13/2024, 11:11 AM        [1] No Known Allergies  "

## 2024-06-17 ENCOUNTER — Ambulatory Visit: Admitting: Nurse Practitioner
